# Patient Record
Sex: Male | Born: 1950 | Race: White | Hispanic: No | Marital: Married | Smoking: Former smoker
Health system: Southern US, Community
[De-identification: ages and names within clinical notes are randomized; demographics above are authoritative.]

## PROBLEM LIST (undated history)

## (undated) DIAGNOSIS — Z0000 Encounter for general adult medical examination without abnormal findings: Secondary | ICD-10-CM

## (undated) DIAGNOSIS — I712 Thoracic aortic aneurysm, without rupture, unspecified: Secondary | ICD-10-CM

## (undated) DIAGNOSIS — T7840XA Allergy, unspecified, initial encounter: Secondary | ICD-10-CM

## (undated) DIAGNOSIS — K5790 Diverticulosis of intestine, part unspecified, without perforation or abscess without bleeding: Secondary | ICD-10-CM

## (undated) DIAGNOSIS — K219 Gastro-esophageal reflux disease without esophagitis: Secondary | ICD-10-CM

## (undated) DIAGNOSIS — E119 Type 2 diabetes mellitus without complications: Secondary | ICD-10-CM

## (undated) DIAGNOSIS — J45909 Unspecified asthma, uncomplicated: Secondary | ICD-10-CM

## (undated) HISTORY — DX: Unspecified asthma, uncomplicated: J45.909

## (undated) HISTORY — DX: Type 2 diabetes mellitus without complications: E11.9

## (undated) HISTORY — DX: Diverticulosis of intestine, part unspecified, without perforation or abscess without bleeding: K57.90

## (undated) HISTORY — DX: Gastro-esophageal reflux disease without esophagitis: K21.9

## (undated) HISTORY — DX: Encounter for general adult medical examination without abnormal findings: Z00.00

## (undated) HISTORY — PX: CATARACT EXTRACTION: SUR2

## (undated) HISTORY — DX: Allergy, unspecified, initial encounter: T78.40XA

---

## 1997-10-14 ENCOUNTER — Emergency Department (HOSPITAL_COMMUNITY): Admission: EM | Admit: 1997-10-14 | Discharge: 1997-10-14 | Payer: Self-pay | Admitting: Emergency Medicine

## 2000-06-20 DIAGNOSIS — K219 Gastro-esophageal reflux disease without esophagitis: Secondary | ICD-10-CM

## 2000-06-20 HISTORY — DX: Gastro-esophageal reflux disease without esophagitis: K21.9

## 2001-01-18 ENCOUNTER — Encounter: Payer: Self-pay | Admitting: *Deleted

## 2001-01-18 ENCOUNTER — Encounter: Admission: RE | Admit: 2001-01-18 | Discharge: 2001-01-18 | Payer: Self-pay | Admitting: *Deleted

## 2001-04-06 LAB — HM COLONOSCOPY

## 2001-04-11 ENCOUNTER — Ambulatory Visit (HOSPITAL_COMMUNITY): Admission: RE | Admit: 2001-04-11 | Discharge: 2001-04-11 | Payer: Self-pay | Admitting: Gastroenterology

## 2001-04-11 ENCOUNTER — Encounter (INDEPENDENT_AMBULATORY_CARE_PROVIDER_SITE_OTHER): Payer: Self-pay | Admitting: *Deleted

## 2001-05-11 ENCOUNTER — Encounter (INDEPENDENT_AMBULATORY_CARE_PROVIDER_SITE_OTHER): Payer: Self-pay | Admitting: *Deleted

## 2001-05-11 ENCOUNTER — Ambulatory Visit (HOSPITAL_COMMUNITY): Admission: RE | Admit: 2001-05-11 | Discharge: 2001-05-11 | Payer: Self-pay | Admitting: Gastroenterology

## 2005-12-12 ENCOUNTER — Ambulatory Visit: Payer: Self-pay | Admitting: Family Medicine

## 2005-12-15 ENCOUNTER — Ambulatory Visit: Payer: Self-pay | Admitting: Family Medicine

## 2005-12-22 ENCOUNTER — Ambulatory Visit: Payer: Self-pay | Admitting: Family Medicine

## 2009-02-03 ENCOUNTER — Ambulatory Visit: Payer: Self-pay | Admitting: Family Medicine

## 2009-11-10 ENCOUNTER — Ambulatory Visit: Payer: Self-pay | Admitting: Family Medicine

## 2009-12-03 ENCOUNTER — Ambulatory Visit: Payer: Self-pay | Admitting: Family Medicine

## 2009-12-25 ENCOUNTER — Ambulatory Visit: Payer: Self-pay | Admitting: Family Medicine

## 2010-01-29 LAB — HM COLONOSCOPY

## 2010-06-28 ENCOUNTER — Ambulatory Visit
Admission: RE | Admit: 2010-06-28 | Discharge: 2010-06-28 | Payer: Self-pay | Source: Home / Self Care | Attending: Family Medicine | Admitting: Family Medicine

## 2010-11-05 NOTE — Procedures (Signed)
[place]. [facility]  Patient:    James Dunn, James Dunn Visit Number: [id number] MRN: [id number]          Service Type: END Location: ENDO Attending Physician:  Charna Elizabeth Dictated by:   Anselmo Rod, M.D. Proc. Date: 04/11/01 Admit Date:  04/11/2001   CC:         Heather Roberts, M.D.   Procedure Report  DATE OF BIRTH:  1951-05-01  REFERRING PHYSICIAN:  Heather Roberts, M.D.  PROCEDURE PERFORMED:  Colonoscopy with snare polypectomy x 1 and biopsies x 7.  ENDOSCOPIST:  Anselmo Rod, M.D.  INSTRUMENT USED:  Olympus adult video colonoscope.  INDICATIONS FOR PROCEDURE:  Blood in stool in a 60 year old white male.  Rule out colonic polyps, masses, hemorrhoids, etc.  PREPROCEDURE PREPARATION:  Informed consent was procured from the patient. The patient was fasted for eight hours prior to the procedure and prepped with a bottle of magnesium citrate and a gallon of NuLytely the night prior to the procedure.  PREPROCEDURE PHYSICAL:  The patient had stable vital signs.  Neck supple. Chest clear to auscultation.  S1, S2 regular.  Abdomen soft with normal abdominal bowel sounds.  DESCRIPTION OF PROCEDURE:  The patient was placed in the left lateral decubitus position and no sedation was used.  Once the patient was adequately positioned, the Olympus video colonoscope was advanced from the rectum to the cecum with difficulty secondary to a large amount of residual stool in the cecum.  There was a large polypoid lesion seen in the right colon.  This was snared piecemeal.  Only a portion of this polyp could be removed.  There were multiple cecal polyps seen.  Because of inadequate prep the cecal polyps were not removed.  An erythematous lesion was seen in the left colon that was biopsied from 20 cm.  There were two other polyps present close to this area but were not removed as the plans were to repeat the colonoscopy and remove the residual polyps in  the colon.  The patient had a few scattered diverticula and tolerated the procedure well without complications.  IMPRESSION: 1. Multiple colonic polyps, large polyps seen in the right colon just distal    to the cecum removed piecemeal by snare polypectomy.  Residual portion of    the polyp was still thought to be left close to the base. 2. Three small cecal polyps seen not removed because of inadequate prep. 3. Erythematous lesion biopsied at 20 cm. 4. Few scattered diverticula.  RECOMMENDATIONS: 1. Await pathology results.  Avoid all nonsteroidals for now. 2. Repeat colonoscopy once the pathology results have been procured. 3. The patient may need a surgical evaluation depending on the pathology    results on the large polyp snared from the proximal right colon.Dictated by: Anselmo Rod, M.D. Attending Physician:  Charna Elizabeth DD:  04/11/01 TD:  04/12/01 Job: [id number]

## 2010-11-05 NOTE — Procedures (Signed)
[place]. [facility]  Patient:    James Dunn, James Dunn Visit Number: [id number] MRN: [id number]          Service Type: END Location: ENDO Attending Physician:  Charna Elizabeth Dictated by:   Anselmo Rod, M.D. Proc. Date: 05/11/01 Admit Date:  05/11/2001 Discharge Date: 05/11/2001   CC:         Heather Roberts, M.D.   Procedure Report  DATE OF BIRTH:  November 22, 1950  REFERRING PHYSICIAN:  Heather Roberts, M.D.  PROCEDURE PERFORMED:  Colonoscopy with snare polypectomy x 6.  ENDOSCOPIST:  Anselmo Rod, M.D.  INSTRUMENT USED:  Olympus video colonoscope.  INDICATIONS FOR PROCEDURE:  Colonic polyp seen on a recent colonoscopy which was incomplete because of inappropriate prep.  Therefore repeat colonoscopy is being performed.  PREPROCEDURE PREPARATION:  Informed consent was procured from the patient. The patient was fasted for eight hours prior to the procedure and prepped with a bottle of magnesium citrate and a gallon of NuLytely the night prior to the procedure.  PREPROCEDURE PHYSICAL:  The patient had stable vital signs.  Neck supple. Chest clear to auscultation.  S1, S2 regular.  Abdomen soft with normal abdominal bowel sounds.  DESCRIPTION OF PROCEDURE:  The patient was placed in the left lateral decubitus position and sedated with 70 mg of Demerol and 7 mg of Versed intravenously.  Once the patient was adequately sedated and maintained on low-flow oxygen and continuous cardiac monitoring, the Olympus video colonoscope was advanced from the rectum to the cecum without difficulty.  The patient had five small polyps removed from the right colon, two of which were in the cecum.  Three of them were removed by hot biopsy from the right colon. One polyp was snared at 65 cm.  Another one was snared from 30 cm and these were placed in bottle #2.  No diverticulosis, large masses or hemorrhoids were seen.  IMPRESSION: 1. Multiple colonic polyps  removed as mentioned above. 2. No diverticulosis or large masses seen.  RECOMMENDATIONS: 1. Await pathology results. 2. Avoid all nonsteroidals including aspirin for the next two to three weeks. 3. Repeat colorectal cancer screening in the next three to five years    depending on the pathology results. 4. Outpatient follow up in the next two weeks.Dictated by:   Anselmo Rod, M.D. Attending Physician:  Charna Elizabeth DD:  05/14/01 TD:  05/15/01 Job: [id number]

## 2010-12-06 ENCOUNTER — Encounter: Payer: Self-pay | Admitting: Family Medicine

## 2010-12-27 ENCOUNTER — Encounter: Payer: Self-pay | Admitting: Family Medicine

## 2010-12-28 ENCOUNTER — Ambulatory Visit (INDEPENDENT_AMBULATORY_CARE_PROVIDER_SITE_OTHER): Payer: 59 | Admitting: Family Medicine

## 2010-12-28 ENCOUNTER — Encounter: Payer: Self-pay | Admitting: Family Medicine

## 2010-12-28 VITALS — BP 130/82 | HR 70 | Wt 193.0 lb

## 2010-12-28 DIAGNOSIS — F524 Premature ejaculation: Secondary | ICD-10-CM

## 2010-12-28 DIAGNOSIS — E739 Lactose intolerance, unspecified: Secondary | ICD-10-CM

## 2010-12-28 DIAGNOSIS — E8881 Metabolic syndrome: Secondary | ICD-10-CM

## 2010-12-28 DIAGNOSIS — B079 Viral wart, unspecified: Secondary | ICD-10-CM

## 2010-12-28 DIAGNOSIS — N529 Male erectile dysfunction, unspecified: Secondary | ICD-10-CM

## 2010-12-28 HISTORY — DX: Male erectile dysfunction, unspecified: N52.9

## 2010-12-28 LAB — POCT GLYCOSYLATED HEMOGLOBIN (HGB A1C): Hemoglobin A1C: 6.4

## 2010-12-28 MED ORDER — SERTRALINE HCL 25 MG PO TABS: 25.0000 mg | ORAL_TABLET | Freq: Every day | ORAL | Status: DC

## 2010-12-28 MED ORDER — VARDENAFIL HCL 20 MG PO TABS: 20.0000 mg | ORAL_TABLET | ORAL | Status: DC | PRN

## 2010-12-28 NOTE — Patient Instructions (Signed)
Call the therapist and set up an appointment for you and your wife or goal own. Let me know how the Zoloft and Levitra work. Return here if you have me to work on the wart.

## 2010-12-28 NOTE — Progress Notes (Signed)
  Subjective:    Patient ID: James Dunn, male    DOB: 25-Sep-1950, 60 y.o.   MRN: [id number]  HPI He is here for a recheck. He has a previous history of glucose intolerance. His weight and blood pressure are recorded. He continues to smoke. He does complain of diffuse intermittent cramping in the arms and the legs but cannot relate this to any particular activity or medications. He also has 2 lesions present on as needed he would like evaluated. He also has a lesion on his right flank. He does complain of difficulty getting and maintaining erections as well as premature ejaculation. He also continues to have difficulty with effective communication between him and his wife. Work continues to go well. He drinks very little.   Review of Systems Negative except as above    Objective:   Physical Exam alert and in no distress. Blood pressure and weight are recorded. 2 warts present on the right knee. He also has a round well demarcated pigmented lesion present on his right flank. Hemoglobin A1c 6.4        Assessment & Plan:  Glucose intolerance. Cigarette abuse. Marital stress. Erectile dysfunction. Premature ejaculation. Seborrheic dermatitis. Warts. Cramps. I will do routine blood screening. Again cautioned him on making diet and exercise changes to help with his risk for diabetes. I explained to him that it is only a matter of time before he tips over the edge. I will also give him Zoloft to help with premature ejaculation. A sample of Levitra given with proper instructions on use. No therapy for the seborrheic dermatitis. Recommend he use Compound W on the warts on a cyclic basis and return here if you would like me to freeze them. Over 45 minutes spent discussing all these issues with him. Did recommend counseling for him and his wife.

## 2010-12-29 LAB — CBC WITH DIFFERENTIAL/PLATELET
Basophils Absolute: 0 10*3/uL (ref 0.0–0.1)
Basophils Relative: 0 % (ref 0–1)
Eosinophils Absolute: 0.3 10*3/uL (ref 0.0–0.7)
Eosinophils Relative: 4 % (ref 0–5)
HCT: 47.6 % (ref 39.0–52.0)
Hemoglobin: 16.1 g/dL (ref 13.0–17.0)
Lymphocytes Relative: 28 % (ref 12–46)
Lymphs Abs: 2.3 10*3/uL (ref 0.7–4.0)
MCH: 31.4 pg (ref 26.0–34.0)
MCHC: 33.8 g/dL (ref 30.0–36.0)
MCV: 92.8 fL (ref 78.0–100.0)
Monocytes Absolute: 0.6 10*3/uL (ref 0.1–1.0)
Monocytes Relative: 7 % (ref 3–12)
Neutro Abs: 5.1 10*3/uL (ref 1.7–7.7)
Neutrophils Relative %: 61 % (ref 43–77)
Platelets: 250 10*3/uL (ref 150–400)
RBC: 5.13 MIL/uL (ref 4.22–5.81)
RDW: 14 % (ref 11.5–15.5)
WBC: 8.3 10*3/uL (ref 4.0–10.5)

## 2010-12-29 LAB — COMPREHENSIVE METABOLIC PANEL
ALT: 28 U/L (ref 0–53)
AST: 20 U/L (ref 0–37)
Albumin: 4.4 g/dL (ref 3.5–5.2)
Alkaline Phosphatase: 56 U/L (ref 39–117)
BUN: 10 mg/dL (ref 6–23)
CO2: 25 mEq/L (ref 19–32)
Calcium: 9.2 mg/dL (ref 8.4–10.5)
Chloride: 99 mEq/L (ref 96–112)
Creat: 1.09 mg/dL (ref 0.50–1.35)
Glucose, Bld: 106 mg/dL — ABNORMAL HIGH (ref 70–99)
Potassium: 4.4 mEq/L (ref 3.5–5.3)
Sodium: 136 mEq/L (ref 135–145)
Total Bilirubin: 0.7 mg/dL (ref 0.3–1.2)
Total Protein: 6.8 g/dL (ref 6.0–8.3)

## 2010-12-29 LAB — LIPID PANEL
Cholesterol: 137 mg/dL (ref 0–200)
HDL: 29 mg/dL — ABNORMAL LOW (ref >39)
LDL Cholesterol: 64 mg/dL (ref 0–99)
Total CHOL/HDL Ratio: 4.7 Ratio
Triglycerides: 221 mg/dL — ABNORMAL HIGH (ref <150)
VLDL: 44 mg/dL — ABNORMAL HIGH (ref 0–40)

## 2010-12-30 ENCOUNTER — Telehealth: Payer: Self-pay

## 2010-12-30 NOTE — Telephone Encounter (Signed)
Called pt talk with pt about labs and mailing diet info

## 2011-06-28 ENCOUNTER — Encounter: Payer: Self-pay | Admitting: Family Medicine

## 2011-06-28 ENCOUNTER — Ambulatory Visit (INDEPENDENT_AMBULATORY_CARE_PROVIDER_SITE_OTHER): Payer: 59 | Admitting: Family Medicine

## 2011-06-28 VITALS — BP 130/80 | HR 60 | Ht 67.5 in | Wt 192.0 lb

## 2011-06-28 DIAGNOSIS — F17200 Nicotine dependence, unspecified, uncomplicated: Secondary | ICD-10-CM

## 2011-06-28 DIAGNOSIS — Z23 Encounter for immunization: Secondary | ICD-10-CM

## 2011-06-28 DIAGNOSIS — E8881 Metabolic syndrome: Secondary | ICD-10-CM

## 2011-06-28 DIAGNOSIS — N529 Male erectile dysfunction, unspecified: Secondary | ICD-10-CM

## 2011-06-28 LAB — POCT GLYCOSYLATED HEMOGLOBIN (HGB A1C): Hemoglobin A1C: 6.2

## 2011-06-28 MED ORDER — METFORMIN HCL 500 MG PO TABS: 500.0000 mg | ORAL_TABLET | ORAL | Status: DC

## 2011-06-28 NOTE — Progress Notes (Signed)
  Subjective:    Patient ID: James Dunn, male    DOB: May 27, 1951, 61 y.o.   MRN: [id number]  HPI He is here for a recheck. He continues to smoke but has cut down significantly. He now uses in mainly for relaxing situations. He has not needed Levitra on any kind of a regular basis. He has made some dietary changes. Does have a previous history of metabolic syndrome. He has no other concerns or complaints.    Review of Systems     Objective:   Physical Exam  Alert and in no distress.  A1c is 6.2.      Assessment & Plan:   1. Metabolic syndrome  POCT HgB A1C  2. Need for prophylactic vaccination and inoculation against influenza  Flu vaccine greater than or equal to 3yo preservative free IM  3. ED (erectile dysfunction)    4. Current smoker     I again stressed the need for him to quit smoking entirely. Discussed his hemoglobin A1c and her risk for diabetes. He is in agreement to go on metformin to help with this. He will call if he needs a refill on his Levitra. Flu shot given.

## 2011-06-29 ENCOUNTER — Ambulatory Visit: Payer: 59 | Admitting: Family Medicine

## 2011-07-20 ENCOUNTER — Telehealth: Payer: Self-pay | Admitting: Family Medicine

## 2011-07-20 NOTE — Telephone Encounter (Signed)
TSD  

## 2011-11-15 ENCOUNTER — Ambulatory Visit: Payer: 59 | Admitting: Family Medicine

## 2011-11-21 ENCOUNTER — Encounter: Payer: Self-pay | Admitting: Family Medicine

## 2011-11-21 ENCOUNTER — Ambulatory Visit (INDEPENDENT_AMBULATORY_CARE_PROVIDER_SITE_OTHER): Payer: 59 | Admitting: Family Medicine

## 2011-11-21 VITALS — BP 130/83 | HR 65 | Wt 192.0 lb

## 2011-11-21 DIAGNOSIS — E8881 Metabolic syndrome: Secondary | ICD-10-CM

## 2011-11-21 DIAGNOSIS — N529 Male erectile dysfunction, unspecified: Secondary | ICD-10-CM

## 2011-11-21 DIAGNOSIS — Z2911 Encounter for prophylactic immunotherapy for respiratory syncytial virus (RSV): Secondary | ICD-10-CM

## 2011-11-21 DIAGNOSIS — Z79899 Other long term (current) drug therapy: Secondary | ICD-10-CM

## 2011-11-21 DIAGNOSIS — F17200 Nicotine dependence, unspecified, uncomplicated: Secondary | ICD-10-CM

## 2011-11-21 DIAGNOSIS — E119 Type 2 diabetes mellitus without complications: Secondary | ICD-10-CM

## 2011-11-21 LAB — POCT GLYCOSYLATED HEMOGLOBIN (HGB A1C): Hemoglobin A1C: 5.9

## 2011-11-21 NOTE — Progress Notes (Signed)
  Subjective:    Patient ID: James Dunn, male    DOB: 08/22/1950, 61 y.o.   MRN: [id number]  HPI He is here for a recheck. He does not check his blood sugars and has yet not received a glucometer. He is taking his metformin and does occasionally use his Levitra. He continues to smoke and at this point is not ready to quit. His physical activity is somewhat active although he is not involved in a regular exercise program. He has not had a recent eye exam and does not check his feet regularly. Review of record indicates his highest hemoglobin A1c was 6.4.  Review of Systems     Objective:   Physical Exam Alert and in no distress. He will A1c is 5.9.       Assessment & Plan:   1. Type II or unspecified type diabetes mellitus without mention of complication, not stated as uncontrolled  POCT HgB A1C  2. Metabolic syndrome  CBC with Differential, Comprehensive metabolic panel, Lipid panel  3. ED (erectile dysfunction)    4. Current smoker    5. Encounter for long-term (current) use of other medications  Varicella-zoster vaccine subcutaneous  I explained that since he technically did not have diabetes with an A1c of 6.4, this was essentially a moot point in that diabetes is a spectrum of disease. He did seem to understand that. We will teach Money is a glucometer. Discussed the need to keep his blood sugars under 150-180. Again encouraged him to stop stopping however at this point is not interested. Recheck here in about 6 months.

## 2011-11-21 NOTE — Patient Instructions (Signed)
When you're rated quit smoking, give me a call. Periodically check your blood sugar and if it goes above 150, call me

## 2011-11-22 LAB — LIPID PANEL
Cholesterol: 150 mg/dL (ref 0–200)
HDL: 32 mg/dL — ABNORMAL LOW (ref >39)
LDL Cholesterol: 74 mg/dL (ref 0–99)
Total CHOL/HDL Ratio: 4.7 Ratio
Triglycerides: 219 mg/dL — ABNORMAL HIGH (ref <150)
VLDL: 44 mg/dL — ABNORMAL HIGH (ref 0–40)

## 2011-11-22 LAB — COMPREHENSIVE METABOLIC PANEL
ALT: 39 U/L (ref 0–53)
AST: 26 U/L (ref 0–37)
Albumin: 4.3 g/dL (ref 3.5–5.2)
Alkaline Phosphatase: 55 U/L (ref 39–117)
BUN: 11 mg/dL (ref 6–23)
CO2: 22 mEq/L (ref 19–32)
Calcium: 9.1 mg/dL (ref 8.4–10.5)
Chloride: 103 mEq/L (ref 96–112)
Creat: 1.07 mg/dL (ref 0.50–1.35)
Glucose, Bld: 101 mg/dL — ABNORMAL HIGH (ref 70–99)
Potassium: 4.4 mEq/L (ref 3.5–5.3)
Sodium: 136 mEq/L (ref 135–145)
Total Bilirubin: 0.9 mg/dL (ref 0.3–1.2)
Total Protein: 7 g/dL (ref 6.0–8.3)

## 2011-11-22 LAB — CBC WITH DIFFERENTIAL/PLATELET
Basophils Absolute: 0 10*3/uL (ref 0.0–0.1)
Basophils Relative: 1 % (ref 0–1)
Eosinophils Absolute: 0.3 10*3/uL (ref 0.0–0.7)
Eosinophils Relative: 5 % (ref 0–5)
HCT: 45.3 % (ref 39.0–52.0)
Hemoglobin: 15.4 g/dL (ref 13.0–17.0)
Lymphocytes Relative: 35 % (ref 12–46)
Lymphs Abs: 2.4 10*3/uL (ref 0.7–4.0)
MCH: 31.3 pg (ref 26.0–34.0)
MCHC: 34 g/dL (ref 30.0–36.0)
MCV: 92.1 fL (ref 78.0–100.0)
Monocytes Absolute: 0.6 10*3/uL (ref 0.1–1.0)
Monocytes Relative: 8 % (ref 3–12)
Neutro Abs: 3.6 10*3/uL (ref 1.7–7.7)
Neutrophils Relative %: 51 % (ref 43–77)
Platelets: 250 10*3/uL (ref 150–400)
RBC: 4.92 MIL/uL (ref 4.22–5.81)
RDW: 13.7 % (ref 11.5–15.5)
WBC: 6.9 10*3/uL (ref 4.0–10.5)

## 2011-11-22 NOTE — Progress Notes (Signed)
Quick Note:  The blood work is normal ______ 

## 2011-12-24 ENCOUNTER — Other Ambulatory Visit: Payer: Self-pay | Admitting: Family Medicine

## 2012-05-22 ENCOUNTER — Ambulatory Visit: Payer: 59 | Admitting: Family Medicine

## 2012-06-25 ENCOUNTER — Encounter: Payer: Self-pay | Admitting: Family Medicine

## 2012-06-25 ENCOUNTER — Ambulatory Visit (INDEPENDENT_AMBULATORY_CARE_PROVIDER_SITE_OTHER): Payer: 59 | Admitting: Family Medicine

## 2012-06-25 VITALS — BP 128/70 | HR 85 | Temp 98.6°F | Wt 191.0 lb

## 2012-06-25 DIAGNOSIS — F17200 Nicotine dependence, unspecified, uncomplicated: Secondary | ICD-10-CM

## 2012-06-25 DIAGNOSIS — J069 Acute upper respiratory infection, unspecified: Secondary | ICD-10-CM

## 2012-06-25 DIAGNOSIS — Z87891 Personal history of nicotine dependence: Secondary | ICD-10-CM | POA: Insufficient documentation

## 2012-06-25 DIAGNOSIS — E119 Type 2 diabetes mellitus without complications: Secondary | ICD-10-CM

## 2012-06-25 HISTORY — DX: Personal history of nicotine dependence: Z87.891

## 2012-06-25 HISTORY — DX: Type 2 diabetes mellitus without complications: E11.9

## 2012-06-25 MED ORDER — ALBUTEROL SULFATE HFA 108 (90 BASE) MCG/ACT IN AERS: 2.0000 | INHALATION_SPRAY | Freq: Four times a day (QID) | RESPIRATORY_TRACT | Status: DC | PRN

## 2012-06-25 NOTE — Progress Notes (Signed)
  Subjective:    Patient ID: James Dunn, male    DOB: 1950-08-31, 62 y.o.   MRN: [id number]  HPI 4 days ago he had the onset of fever , chills, anorexia, dry cough that has now become productive as well as a headache but no sore throat or earache. He continues to smoke although he has not smoked will he's had these symptoms present. He has not followed up on his diabetes from last July.   Review of Systems     Objective:   Physical Exam alert and in no distress. Tympanic membranes and canals are normal. Throat is clear. Tonsils are normal. Neck is supple without adenopathy or thyromegaly. Cardiac exam shows a regular sinus rhythm without murmurs or gallops. Lungs show expiratory wheezing.        Assessment & Plan:   1. URI, acute  albuterol (PROVENTIL HFA;VENTOLIN HFA) 108 (90 BASE) MCG/ACT inhaler  2. Current smoker    3. Type II or unspecified type diabetes mellitus without mention of complication, not stated as uncontrolled     he is not ready to quit smoking. I will treat him with Proventil but did encourage him to call me if after 7-10 days he is not improved. Also he is to followup within the next several weeks on his underlying diabetes.

## 2012-07-10 ENCOUNTER — Encounter: Payer: Self-pay | Admitting: Family Medicine

## 2012-07-10 ENCOUNTER — Ambulatory Visit (INDEPENDENT_AMBULATORY_CARE_PROVIDER_SITE_OTHER): Payer: 59 | Admitting: Family Medicine

## 2012-07-10 VITALS — BP 128/88 | HR 79 | Wt 192.0 lb

## 2012-07-10 DIAGNOSIS — F17200 Nicotine dependence, unspecified, uncomplicated: Secondary | ICD-10-CM

## 2012-07-10 DIAGNOSIS — E119 Type 2 diabetes mellitus without complications: Secondary | ICD-10-CM

## 2012-07-10 DIAGNOSIS — Z23 Encounter for immunization: Secondary | ICD-10-CM

## 2012-07-10 NOTE — Progress Notes (Signed)
  Subjective:    Patient ID: James Dunn, male    DOB: [birth year], 62 y.o.   MRN: [id number]  HPI He is here for a recheck. Since his last visit he is doing much better having much less difficulty with coughing. He is still using his albuterol inhaler. He has stopped taking his Glucophage at the recommendation of his wife. He has not been on this medication in approximately 2 months. He states his blood sugars run in the 80 range. He states he is now down to 3 or 4 cigarettes per day. He keeps himself busy and does play golf. He does intermittently check his blood sugar.   Review of Systems     Objective:   Physical Exam Alert and in no distress. Globin A1c is 6.4.       Assessment & Plan:   1. Type II or unspecified type diabetes mellitus without mention of complication, not stated as uncontrolled  POCT glycosylated hemoglobin (Hb A1C)  2. Current smoker     25 minutes spent discussing his diabetes as well as smoking and medication use. Since he stopped his Glucophage and is doing well, I will recommend he stay off this medication but check his blood sugars more regularly. Encouraged him to quit smoking entirely. Discussed the fact that the last several cigarettes seem to be his friends and he is unwilling to give them up. Recheck here in several months.

## 2012-11-19 ENCOUNTER — Ambulatory Visit: Payer: 59 | Admitting: Family Medicine

## 2013-04-02 ENCOUNTER — Other Ambulatory Visit (INDEPENDENT_AMBULATORY_CARE_PROVIDER_SITE_OTHER): Payer: 59

## 2013-04-02 DIAGNOSIS — Z23 Encounter for immunization: Secondary | ICD-10-CM

## 2015-05-21 ENCOUNTER — Other Ambulatory Visit (INDEPENDENT_AMBULATORY_CARE_PROVIDER_SITE_OTHER): Payer: PPO

## 2015-05-21 DIAGNOSIS — Z23 Encounter for immunization: Secondary | ICD-10-CM

## 2016-03-30 ENCOUNTER — Other Ambulatory Visit (INDEPENDENT_AMBULATORY_CARE_PROVIDER_SITE_OTHER): Payer: PPO

## 2016-03-30 DIAGNOSIS — Z23 Encounter for immunization: Secondary | ICD-10-CM

## 2016-05-11 ENCOUNTER — Ambulatory Visit (INDEPENDENT_AMBULATORY_CARE_PROVIDER_SITE_OTHER): Payer: PPO | Admitting: Family Medicine

## 2016-05-11 ENCOUNTER — Encounter: Payer: Self-pay | Admitting: Family Medicine

## 2016-05-11 VITALS — BP 122/82 | HR 67 | Ht 68.0 in | Wt 186.0 lb

## 2016-05-11 DIAGNOSIS — E089 Diabetes mellitus due to underlying condition without complications: Secondary | ICD-10-CM

## 2016-05-11 DIAGNOSIS — M6740 Ganglion, unspecified site: Secondary | ICD-10-CM | POA: Diagnosis not present

## 2016-05-11 DIAGNOSIS — IMO0001 Reserved for inherently not codable concepts without codable children: Secondary | ICD-10-CM

## 2016-05-11 DIAGNOSIS — E785 Hyperlipidemia, unspecified: Secondary | ICD-10-CM

## 2016-05-11 DIAGNOSIS — Z0000 Encounter for general adult medical examination without abnormal findings: Secondary | ICD-10-CM | POA: Diagnosis not present

## 2016-05-11 DIAGNOSIS — Z860100 Personal history of colon polyps, unspecified: Secondary | ICD-10-CM

## 2016-05-11 DIAGNOSIS — E0865 Diabetes mellitus due to underlying condition with hyperglycemia: Secondary | ICD-10-CM

## 2016-05-11 DIAGNOSIS — F17200 Nicotine dependence, unspecified, uncomplicated: Secondary | ICD-10-CM

## 2016-05-11 DIAGNOSIS — Z1159 Encounter for screening for other viral diseases: Secondary | ICD-10-CM | POA: Diagnosis not present

## 2016-05-11 DIAGNOSIS — Z91199 Patient's noncompliance with other medical treatment and regimen due to unspecified reason: Secondary | ICD-10-CM

## 2016-05-11 DIAGNOSIS — Z23 Encounter for immunization: Secondary | ICD-10-CM

## 2016-05-11 DIAGNOSIS — E1169 Type 2 diabetes mellitus with other specified complication: Secondary | ICD-10-CM | POA: Insufficient documentation

## 2016-05-11 DIAGNOSIS — Z9119 Patient's noncompliance with other medical treatment and regimen: Secondary | ICD-10-CM | POA: Diagnosis not present

## 2016-05-11 HISTORY — DX: Personal history of colonic polyps: Z86.010

## 2016-05-11 HISTORY — DX: Personal history of colon polyps, unspecified: Z86.0100

## 2016-05-11 HISTORY — DX: Type 2 diabetes mellitus with other specified complication: E11.69

## 2016-05-11 LAB — CBC WITH DIFFERENTIAL/PLATELET
Basophils Absolute: 0 cells/uL (ref 0–200)
Basophils Relative: 0 %
Eosinophils Absolute: 312 cells/uL (ref 15–500)
Eosinophils Relative: 4 %
HCT: 44.9 % (ref 38.5–50.0)
Hemoglobin: 15.7 g/dL (ref 13.2–17.1)
Lymphocytes Relative: 32 %
Lymphs Abs: 2496 cells/uL (ref 850–3900)
MCH: 32.2 pg (ref 27.0–33.0)
MCHC: 35 g/dL (ref 32.0–36.0)
MCV: 92.2 fL (ref 80.0–100.0)
MPV: 9.8 fL (ref 7.5–12.5)
Monocytes Absolute: 468 cells/uL (ref 200–950)
Monocytes Relative: 6 %
Neutro Abs: 4524 cells/uL (ref 1500–7800)
Neutrophils Relative %: 58 %
Platelets: 231 10*3/uL (ref 140–400)
RBC: 4.87 MIL/uL (ref 4.20–5.80)
RDW: 13.2 % (ref 11.0–15.0)
WBC: 7.8 10*3/uL (ref 4.0–10.5)

## 2016-05-11 LAB — POCT UA - MICROALBUMIN
Albumin/Creatinine Ratio, Urine, POC: 8.1
Creatinine, POC: 220.9 mg/dL
Microalbumin Ur, POC: 17.9 mg/L

## 2016-05-11 LAB — POCT GLYCOSYLATED HEMOGLOBIN (HGB A1C): Hemoglobin A1C: 10

## 2016-05-11 MED ORDER — SITAGLIPTIN PHOS-METFORMIN HCL 50-500 MG PO TABS: 1.0000 | ORAL_TABLET | Freq: Two times a day (BID) | ORAL | 1 refills | Status: DC

## 2016-05-11 MED ORDER — LISINOPRIL 5 MG PO TABS: 5.0000 mg | ORAL_TABLET | Freq: Every day | ORAL | 3 refills | Status: DC

## 2016-05-11 MED ORDER — ATORVASTATIN CALCIUM 10 MG PO TABS: 10.0000 mg | ORAL_TABLET | Freq: Every day | ORAL | 3 refills | Status: DC

## 2016-05-11 NOTE — Patient Instructions (Signed)
Go to the American diabetes Association website or [facility] See Dr. Katy Fitch for your eye care

## 2016-05-11 NOTE — Addendum Note (Signed)
Addended by: Randel Books on: 05/11/2016 03:18 PM   Modules accepted: Orders

## 2016-05-11 NOTE — Progress Notes (Signed)
Subjective:   HPI  James Dunn is a 65 y.o. male who presents for a complete physical and a Medicare annual wellness. He has not been seen here in over 3 years. At that time he had a hemoglobin A1c of 6.4 and was given metformin however he did not start taking it. Presently his only complaint is a lesion on his left wrist but it does not cause any difficulties. He does continue to smoke. He did quit smoking at one point in the past but started up again due to stress. He is not ready to quit smoking. He has a previous history of erectile dysfunction however this did not come for discussion. He basically states that he does not like taking medications. He has no other concerns or complaints. No evidence of recent falls or depression. His marriage is going quite well.  Medical care team includes:     Preventative care: Last ophthalmology visit: ? Last dental visit: 12/17 Last colonoscopy:01-29-10  Last EKG: ? Last labs:11-21-11  Prior vaccinations: TD or Tdap:12-03-09 Influenza:03-30-16 Pneumococcal:23 01-17-01 Shingles/Zostavax: 11/21/11  Advanced directive: Not discussed    Reviewed their medical, surgical, family, social, medication, and allergy history and updated chart as appropriate.    Review of Systems Negative except as above   Objective:   Physical Exam  General appearance: alert, no distress, WD/WN,  Skin:Normal HEENT: normocephalic, conjunctiva/corneas normal, sclerae anicteric, PERRLA, EOMi, nares patent, no discharge or erythema, pharynx normal Oral cavity: MMM, tongue normal, teeth normal Neck: supple, no lymphadenopathy, no thyromegaly, no masses, normal ROM Chest: non tender, normal shape and expansion Heart: RRR, normal S1, S2, no murmurs Lungs: CTA bilaterally, no wheezes, rhonchi, or rales Abdomen: +bs, soft, non tender, non distended, no masses, no hepatomegaly, no splenomegaly, no bruits musculoskeletal: upper extremities non tender, no obvious deformity,  normal ROM throughout, lower extremities non tender, 2 cm round smooth movable lesion noted on the left wrist Extremities: no edema, no cyanosis, no clubbing Pulses: 2+ symmetric, upper and lower extremities, normal cap refill Neurological: alert, oriented x 3, CN2-12 intact, strength normal upper extremities and lower extremities, sensation normal throughout, DTRs 2+ throughout, no cerebellar signs, gait normal Psychiatric: normal affect, behavior normal, pleasant  GU: normal male external genitalia, nontender, no masses, no hernia, no lymphadenopathy Hemoglobin A1c 10.0  Assessment and Plan :   Need for prophylactic vaccination against Streptococcus pneumoniae (pneumococcus) - Plan: Pneumococcal conjugate vaccine 13-valent  Current smoker  Diabetes mellitus due to underlying condition, uncontrolled, without complication, without long-term current use of insulin (HCC) - Plan: HgB A1c, CBC with Differential/Platelet, Comprehensive metabolic panel, Lipid panel, sitaGLIPtin-metformin (JANUMET) 50-500 MG tablet, lisinopril (PRINIVIL,ZESTRIL) 5 MG tablet, CANCELED: POCT UA - Microalbumin  Hyperlipidemia associated with type 2 diabetes mellitus (HCC) - Plan: Lipid panel, atorvastatin (LIPITOR) 10 MG tablet  History of colonic polyps - Plan: Ambulatory referral to Gastroenterology  Personal history of noncompliance with medical treatment, presenting hazards to health  Routine general medical examination at a health care facility - Plan: CBC with Differential/Platelet, Comprehensive metabolic panel, Lipid panel  Encounter for hepatitis C screening test for low risk patient - Plan: Hepatitis C antibody I discussed his lack of compliance with his overall care in detail with him. Discussed diabetes and the risk of stroke, blindness, heart failure, heart attack, kidney failure, nerve damage. Discussed foot care, eye care, checking his blood sugars regularly including before meals or 2 hours after meals.  Discussed going to diabetes and nutrition however he would rather try  and educate himself on the Internet. Discussed medications with him. Explained that with time he will need be placed on more and more medications. He will also be referred back to GI as he does have a previous history of adenomatous polyps. Instructed on proper use of the glucometer. Over one hour spent greater than 50% in counseling and coordination of care. I explained that the ganglion was of no major concern and nothing needed to be done.  Follow-up one month.

## 2016-05-12 LAB — LIPID PANEL
Cholesterol: 168 mg/dL (ref <200)
HDL: 27 mg/dL — ABNORMAL LOW (ref >40)
LDL Cholesterol: 89 mg/dL (ref <100)
Total CHOL/HDL Ratio: 6.2 Ratio — ABNORMAL HIGH (ref <5.0)
Triglycerides: 258 mg/dL — ABNORMAL HIGH (ref <150)
VLDL: 52 mg/dL — ABNORMAL HIGH (ref <30)

## 2016-05-12 LAB — COMPREHENSIVE METABOLIC PANEL
ALT: 27 U/L (ref 9–46)
AST: 22 U/L (ref 10–35)
Albumin: 4.6 g/dL (ref 3.6–5.1)
Alkaline Phosphatase: 54 U/L (ref 40–115)
BUN: 13 mg/dL (ref 7–25)
CO2: 24 mmol/L (ref 20–31)
Calcium: 9.3 mg/dL (ref 8.6–10.3)
Chloride: 100 mmol/L (ref 98–110)
Creat: 1.26 mg/dL — ABNORMAL HIGH (ref 0.70–1.25)
Glucose, Bld: 179 mg/dL — ABNORMAL HIGH (ref 65–99)
Potassium: 4.5 mmol/L (ref 3.5–5.3)
Sodium: 135 mmol/L (ref 135–146)
Total Bilirubin: 0.9 mg/dL (ref 0.2–1.2)
Total Protein: 7.4 g/dL (ref 6.1–8.1)

## 2016-05-12 LAB — HEPATITIS C ANTIBODY: HCV Ab: NEGATIVE

## 2016-05-20 ENCOUNTER — Telehealth: Payer: Self-pay | Admitting: Family Medicine

## 2016-05-20 MED ORDER — GLUCOSE BLOOD VI STRP: ORAL_STRIP | 12 refills | Status: DC

## 2016-05-20 MED ORDER — ONETOUCH ULTRASOFT LANCETS MISC: 12 refills | Status: DC

## 2016-05-20 NOTE — Telephone Encounter (Signed)
Pt called and stated that at his last office visit he was given a one touch ultra 2 blood monitor machine. He needs the strips and lancets to go with it. Please send to [facility].

## 2016-05-20 NOTE — Telephone Encounter (Signed)
Called in, pt notified. James Dunn

## 2016-06-22 ENCOUNTER — Encounter: Payer: Self-pay | Admitting: Family Medicine

## 2016-06-22 ENCOUNTER — Telehealth: Payer: Self-pay

## 2016-06-22 ENCOUNTER — Ambulatory Visit (INDEPENDENT_AMBULATORY_CARE_PROVIDER_SITE_OTHER): Payer: PPO | Admitting: Family Medicine

## 2016-06-22 VITALS — BP 132/80 | HR 68 | Resp 16 | Ht 67.0 in | Wt 182.8 lb

## 2016-06-22 DIAGNOSIS — J069 Acute upper respiratory infection, unspecified: Secondary | ICD-10-CM | POA: Diagnosis not present

## 2016-06-22 DIAGNOSIS — E0865 Diabetes mellitus due to underlying condition with hyperglycemia: Secondary | ICD-10-CM | POA: Diagnosis not present

## 2016-06-22 DIAGNOSIS — E089 Diabetes mellitus due to underlying condition without complications: Secondary | ICD-10-CM

## 2016-06-22 DIAGNOSIS — IMO0001 Reserved for inherently not codable concepts without codable children: Secondary | ICD-10-CM

## 2016-06-22 MED ORDER — ALBUTEROL SULFATE HFA 108 (90 BASE) MCG/ACT IN AERS: 2.0000 | INHALATION_SPRAY | Freq: Four times a day (QID) | RESPIRATORY_TRACT | 0 refills | Status: DC | PRN

## 2016-06-22 NOTE — Telephone Encounter (Signed)
Let him know that he will hear from the diabetes and nutrition program from  [facility]

## 2016-06-22 NOTE — Progress Notes (Signed)
  Subjective:    Patient ID: James Dunn, male    DOB: 04-08-1951, 66 y.o.   MRN: [id number]  James Dunn is a 66 y.o. male who presents for follow-up of Type 2 diabetes mellitus.  Patient is checking home blood sugars.   Home blood sugar records: BGs range between 81 and 149 7 day avg is 122 per machine How often is blood sugars being checked: checks 2-3 times for day.  Current symptoms/problems include none and have been stable. Daily foot checks: no    Any foot concerns:no Last eye exam: over 2-3 years.  Exercise: tries to but with weather has been limited  He is made some changes in his diet as well as exercise regimen and has lost roughly 3 pounds. He feels very good about this. He is checking his blood sugars before meals and 2 hours after meals. He has not really followed up concerning getting better educated. The following portions of the patient's history were reviewed and updated as appropriate: allergies, current medications, past medical history, past social history and problem list.  ROS as in subjective above.     Objective:    Physical Exam Alert and in no distress otherwise not examined.   Lab Review Diabetic Labs Latest Ref Rng & Units 05/11/2016 11/21/2011 06/28/2011 12/28/2010  HbA1c - 10.0 5.9 6.2 6.4  Microalbumin mg/L 17.9 - - -  Micro/Creat Ratio - 8.1 - - -  Chol <200 mg/dL 168 150 - 137  HDL >40 mg/dL 27(L) 32(L) - 29(L)  Calc LDL <100 mg/dL 89 74 - 64  Triglycerides <150 mg/dL 258(H) 219(H) - 221(H)  Creatinine 0.70 - 1.25 mg/dL 1.26(H) 1.07 - 1.09   BP/Weight 06/22/2016 05/11/2016 07/10/2012 [id number]  Systolic BP [id number]  Diastolic BP 80 82 88 70 83  Wt. (Lbs) 182.8 186 192 191 192  BMI 28.63 28.28 29.61 29.46 29.61     James Dunn  reports that he has been smoking Cigarettes.  He has never used smokeless tobacco. He reports that he drinks alcohol. He reports that he does not use drugs.     Assessment & Plan:     1. Rx changes:  none 2. Education: Reviewed 'ABCs' of diabetes management (respective goals in parentheses):  A1C (<7), blood pressure (<130/80), and cholesterol (LDL <100). 3. Compliance at present is estimated to be good. Efforts to improve compliance (if necessary) will be directed at I will set him up for diabetes and nutrition counseling.. Did discuss cutting back on carbohydrates. 4. Follow up: 3 months Will also renew albuterol as he does use this very sparingly when he has cough and congestion.

## 2016-06-22 NOTE — Telephone Encounter (Signed)
Number provided to pt as he does not answer numbers he is not familiar with. James Dunn

## 2016-06-22 NOTE — Telephone Encounter (Signed)
Pt questions what diabetes classes you were wanting him to attend?

## 2016-07-09 DIAGNOSIS — J40 Bronchitis, not specified as acute or chronic: Secondary | ICD-10-CM | POA: Diagnosis not present

## 2016-07-09 DIAGNOSIS — J209 Acute bronchitis, unspecified: Secondary | ICD-10-CM | POA: Diagnosis not present

## 2016-07-27 ENCOUNTER — Encounter: Payer: Self-pay | Admitting: Family Medicine

## 2016-07-27 ENCOUNTER — Ambulatory Visit (INDEPENDENT_AMBULATORY_CARE_PROVIDER_SITE_OTHER): Payer: PPO | Admitting: Family Medicine

## 2016-07-27 VITALS — BP 100/60 | HR 77 | Temp 98.0°F | Wt 173.0 lb

## 2016-07-27 DIAGNOSIS — F17200 Nicotine dependence, unspecified, uncomplicated: Secondary | ICD-10-CM

## 2016-07-27 DIAGNOSIS — R6883 Chills (without fever): Secondary | ICD-10-CM | POA: Diagnosis not present

## 2016-07-27 DIAGNOSIS — E119 Type 2 diabetes mellitus without complications: Secondary | ICD-10-CM | POA: Diagnosis not present

## 2016-07-27 LAB — POCT GLYCOSYLATED HEMOGLOBIN (HGB A1C): Hemoglobin A1C: 6.8

## 2016-07-27 MED ORDER — METFORMIN HCL 1000 MG PO TABS: 1000.0000 mg | ORAL_TABLET | Freq: Two times a day (BID) | ORAL | 1 refills | Status: DC

## 2016-07-27 NOTE — Progress Notes (Signed)
   Subjective:    Patient ID: James Dunn, male    DOB: 08/30/1950, 66 y.o.   MRN: [id number]  HPI Approximately one month ago he had URI symptoms and was seen in an urgent care center. He was placed on antibiotics as well as a tapered dose of steroids. He states he did get back to normal except for occasionally having difficulty with chills. No fever, sore throat, earache, nasal congestion, PND, chest congestion or coughing. He does smoke and is not yet ready to quit. Also he has concerns over his diabetes. He is taking Janumet but states that the cost is more than he wants to pay. He has been compulsive with his diet and exercise and is planning to go to the diabetes and nutrition classes. He has lost several pounds.   Review of Systems     Objective:   Physical Exam Alert and in no distress. Tympanic membranes and canals are normal. Pharyngeal area is normal. Neck is supple without adenopathy or thyromegaly. Cardiac exam shows a regular sinus rhythm without murmurs or gallops. Lungs are clear to auscultation.       Assessment & Plan:  Chills  Controlled type 2 diabetes mellitus without complication, without long-term current use of insulin (HCC)  Current smoker I explained that since his symptoms are only chills and his exam was normal, the etiology behind it is unclear. Recommended he keep track of the chills and any other symptoms that might still be occurring and let me know. We then discussed his diabetes. I will stop the Janumet and switch him back to metformin. He will continue to do a good job with his diet and exercise and we will recheck this in approximately 4 months.

## 2016-07-27 NOTE — Patient Instructions (Signed)
Keep track of the chills and see if there are any other symptoms that go with it

## 2016-08-02 ENCOUNTER — Encounter: Payer: Self-pay | Admitting: Dietician

## 2016-08-02 ENCOUNTER — Encounter: Payer: PPO | Attending: Family Medicine | Admitting: Dietician

## 2016-08-02 DIAGNOSIS — E089 Diabetes mellitus due to underlying condition without complications: Secondary | ICD-10-CM | POA: Insufficient documentation

## 2016-08-02 DIAGNOSIS — E0865 Diabetes mellitus due to underlying condition with hyperglycemia: Secondary | ICD-10-CM | POA: Insufficient documentation

## 2016-08-02 DIAGNOSIS — IMO0001 Reserved for inherently not codable concepts without codable children: Secondary | ICD-10-CM

## 2016-08-02 DIAGNOSIS — Z713 Dietary counseling and surveillance: Secondary | ICD-10-CM | POA: Insufficient documentation

## 2016-08-02 NOTE — Progress Notes (Signed)
Patient was seen on 08/02/16 for the first of a series of three diabetes self-management courses at the [facility].  Patient Education Plan per assessed needs and concerns is to attend four course education program for Diabetes Self Management Education.  The following learning objectives were met by the patient during this class:  Describe diabetes  State some common risk factors for diabetes  Defines the role of glucose and insulin  Identifies type of diabetes and pathophysiology  Describe the relationship between diabetes and cardiovascular risk  State the members of the Healthcare Team  States the rationale for glucose monitoring  State when to test glucose  State their individual Target Range  State the importance of logging glucose readings  Describe how to interpret glucose readings  Identifies A1C target  Explain the correlation between A1c and eAG values  State symptoms and treatment of high blood glucose  State symptoms and treatment of low blood glucose  Explain proper technique for glucose testing  Identifies proper sharps disposal  Handouts given during class include:  Living Well with Diabetes book  Carb Counting and Meal Planning book  Meal Plan Card  Carbohydrate guide  Meal planning worksheet  Low Sodium Flavoring Tips  The diabetes portion plate  Y4M to eAG Conversion Chart  Diabetes Medications  Diabetes Recommended Care Schedule  Support Group  Diabetes Success Plan  Core Class Satisfaction Survey  Follow-Up Plan:  Attend core 2

## 2016-08-09 ENCOUNTER — Encounter: Payer: PPO | Admitting: Dietician

## 2016-08-09 DIAGNOSIS — IMO0001 Reserved for inherently not codable concepts without codable children: Secondary | ICD-10-CM

## 2016-08-09 DIAGNOSIS — E0865 Diabetes mellitus due to underlying condition with hyperglycemia: Principal | ICD-10-CM

## 2016-08-09 DIAGNOSIS — Z713 Dietary counseling and surveillance: Secondary | ICD-10-CM | POA: Diagnosis not present

## 2016-08-09 NOTE — Progress Notes (Signed)

## 2016-08-16 ENCOUNTER — Encounter: Payer: PPO | Admitting: Dietician

## 2016-08-16 DIAGNOSIS — E0865 Diabetes mellitus due to underlying condition with hyperglycemia: Principal | ICD-10-CM

## 2016-08-16 DIAGNOSIS — Z713 Dietary counseling and surveillance: Secondary | ICD-10-CM | POA: Diagnosis not present

## 2016-08-16 DIAGNOSIS — IMO0001 Reserved for inherently not codable concepts without codable children: Secondary | ICD-10-CM

## 2016-08-16 NOTE — Progress Notes (Signed)
Patient was seen on 08/16/16 for the third of a series of three diabetes self-management courses at the [facility].   James Dunn the amount of activity recommended for healthy living . Describe activities suitable for individual needs . Identify ways to regularly incorporate activity into daily life . Identify barriers to activity and ways to over come these barriers  Identify diabetes medications being personally used and their primary action for lowering glucose and possible side effects . Describe role of stress on blood glucose and develop strategies to address psychosocial issues . Identify diabetes complications and ways to prevent them  Explain how to manage diabetes during illness . Evaluate success in meeting personal goal . Establish 2-3 goals that they will plan to diligently work on until they return for the  40-month follow-up visit  Goals:   I will count my carb choices at most meals and snacks  I will be active 30 minutes or more 5 times a week  I will eat less unhealthy fats.  Your patient has identified these potential barriers to change:  Motivation  Your patient has identified their diabetes self-care support plan as  Family Support On-line Resources Plan:  Attend Support Group as desired

## 2016-08-18 ENCOUNTER — Telehealth: Payer: Self-pay | Admitting: Family Medicine

## 2016-08-18 NOTE — Telephone Encounter (Signed)
Pt called and states that he got a robo call stating he needed to come in for a A1 c and he states that he just had one done and it does look like he had one done a couple a weeks a ago. Please advise

## 2016-08-19 NOTE — Telephone Encounter (Signed)
Left message for pt that call was not from [place] and that it may be [facility] but we are not for sure

## 2016-08-19 NOTE — Telephone Encounter (Signed)
I have no clue about the  robi call came from but not from [place]

## 2016-10-11 ENCOUNTER — Ambulatory Visit: Payer: PPO | Admitting: Family Medicine

## 2016-11-01 DIAGNOSIS — K5730 Diverticulosis of large intestine without perforation or abscess without bleeding: Secondary | ICD-10-CM | POA: Diagnosis not present

## 2016-11-01 DIAGNOSIS — Z1211 Encounter for screening for malignant neoplasm of colon: Secondary | ICD-10-CM | POA: Diagnosis not present

## 2016-11-01 DIAGNOSIS — Z86010 Personal history of colonic polyps: Secondary | ICD-10-CM | POA: Diagnosis not present

## 2016-11-01 DIAGNOSIS — K219 Gastro-esophageal reflux disease without esophagitis: Secondary | ICD-10-CM | POA: Diagnosis not present

## 2016-11-11 ENCOUNTER — Telehealth: Payer: Self-pay

## 2016-11-11 ENCOUNTER — Other Ambulatory Visit: Payer: Self-pay

## 2016-11-11 DIAGNOSIS — E119 Type 2 diabetes mellitus without complications: Secondary | ICD-10-CM

## 2016-11-11 MED ORDER — ONETOUCH DELICA LANCING DEV MISC: 1.0000 | Freq: Two times a day (BID) | 1 refills | Status: DC

## 2016-11-11 NOTE — Telephone Encounter (Signed)
Pt needs script of the one touch delica lancets, (not ultrasoft) [facility] to [facility] [address]. Thank you, RLB

## 2016-11-11 NOTE — Telephone Encounter (Signed)
Faxed to pharmacy

## 2016-11-29 ENCOUNTER — Encounter: Payer: Self-pay | Admitting: Family Medicine

## 2016-11-29 ENCOUNTER — Ambulatory Visit (INDEPENDENT_AMBULATORY_CARE_PROVIDER_SITE_OTHER): Payer: PPO | Admitting: Family Medicine

## 2016-11-29 VITALS — BP 120/70 | HR 46 | Ht 67.0 in | Wt 175.0 lb

## 2016-11-29 DIAGNOSIS — Z86010 Personal history of colonic polyps: Secondary | ICD-10-CM | POA: Diagnosis not present

## 2016-11-29 DIAGNOSIS — J301 Allergic rhinitis due to pollen: Secondary | ICD-10-CM | POA: Diagnosis not present

## 2016-11-29 DIAGNOSIS — E785 Hyperlipidemia, unspecified: Secondary | ICD-10-CM | POA: Diagnosis not present

## 2016-11-29 DIAGNOSIS — J4520 Mild intermittent asthma, uncomplicated: Secondary | ICD-10-CM

## 2016-11-29 DIAGNOSIS — E0865 Diabetes mellitus due to underlying condition with hyperglycemia: Secondary | ICD-10-CM

## 2016-11-29 DIAGNOSIS — N529 Male erectile dysfunction, unspecified: Secondary | ICD-10-CM | POA: Diagnosis not present

## 2016-11-29 DIAGNOSIS — E1169 Type 2 diabetes mellitus with other specified complication: Secondary | ICD-10-CM

## 2016-11-29 DIAGNOSIS — F17200 Nicotine dependence, unspecified, uncomplicated: Secondary | ICD-10-CM | POA: Diagnosis not present

## 2016-11-29 DIAGNOSIS — IMO0001 Reserved for inherently not codable concepts without codable children: Secondary | ICD-10-CM

## 2016-11-29 HISTORY — DX: Allergic rhinitis due to pollen: J30.1

## 2016-11-29 HISTORY — DX: Mild intermittent asthma, uncomplicated: J45.20

## 2016-11-29 LAB — POCT GLYCOSYLATED HEMOGLOBIN (HGB A1C): Hemoglobin A1C: 6.2

## 2016-11-29 NOTE — Progress Notes (Signed)
Subjective:    Patient ID: James Dunn, male    DOB: [birth year], 66 y.o.   MRN: [id number]  [name] is a 66 y.o. male who presents for follow-up of Type 2 diabetes mellitus.  Patient is checking home blood sugars.   Home blood sugar records: ave 120 How often is blood sugars being checked: once a day Current symptoms/problems None Daily foot checks: yes  Any foot concerns: none Last eye exam: Not yet Exercise: working and golfing He continues to smoke and is at least considering quitting. He does have underlying allergic rhinitis but does not take medications for this he rarely uses albuterol usually during spring and fall and mainly when he has coughing spells. He is taking metformin and having no difficulty with that. Continues on Lipitor and has had no aches or pains with that. He is also on lisinopril/HCTZ and having no swelling or discomfort. He is scheduled for a colonoscopy to follow-up on history of polyps. He does have underlying ED but presently is not on any medication. He does occasionally complain of right hip pain but at this point is not interested in pursuing any further evaluation. The following portions of the patient's history were reviewed and updated as appropriate: allergies, current medications, past medical history, past social history and problem list.  ROS as in subjective above.     Objective:    Physical Exam Alert and in no distress otherwise not examined.   Lab Review Diabetic Labs Latest Ref Rng & Units 07/27/2016 05/11/2016 11/21/2011 06/28/2011 12/28/2010  HbA1c - 6.8 10.0 5.9 6.2 6.4  Microalbumin mg/L - 17.9 - - -  Micro/Creat Ratio - - 8.1 - - -  Chol <200 mg/dL - 168 150 - 137  HDL >40 mg/dL - 27(L) 32(L) - 29(L)  Calc LDL <100 mg/dL - 89 74 - 64  Triglycerides <150 mg/dL - 258(H) 219(H) - 221(H)  Creatinine 0.70 - 1.25 mg/dL - 1.26(H) 1.07 - 1.09   BP/Weight 08/02/2016 07/27/2016 06/22/2016 05/11/2016 4/62/7035  Systolic BP - 009 381 829 937    Diastolic BP - 60 80 82 88  Wt. (Lbs) 174 173 182.8 186 192  BMI 27.25 27.1 28.63 28.28 29.61   A1c is 6.2 [name]  reports that he has been smoking Cigarettes.  He has never used smokeless tobacco. He reports that he drinks alcohol. He reports that he does not use drugs.     Assessment & Plan:    Diabetes mellitus due to underlying condition, uncontrolled, without complication, without long-term current use of insulin ([place]) - Plan: HgB A1c, Ambulatory referral to Ophthalmology  Current smoker  Erectile dysfunction, unspecified erectile dysfunction type  Hyperlipidemia associated with type 2 diabetes mellitus ([place])  History of colonic polyps  Seasonal allergic rhinitis due to pollen  Mild intermittent asthma without complication    1. Rx changes: none 2. Education: Reviewed 'ABCs' of diabetes management (respective goals in parentheses):  A1C (<7), blood pressure (<130/80), and cholesterol (LDL <100). 3. Compliance at present is estimated to be good. Efforts to improve compliance (if necessary) will be directed at No change. 4. Follow up: 4 months Discussed smoking cessation with him however at this point he is not interested. He is not interested in pursuing the right hip discomfort. He will continue on his present medication regimen for the above diagnoses. He is stable on all these medications.. I will set him up for ophthalmology evaluation. Discussed the use of albuterol. He seems to have a  good handle on this. Allergies are really not causing him much trouble. Presently he is not very sexually active so that does not seem to be a major problem.

## 2016-11-30 DIAGNOSIS — D125 Benign neoplasm of sigmoid colon: Secondary | ICD-10-CM | POA: Diagnosis not present

## 2016-11-30 DIAGNOSIS — D123 Benign neoplasm of transverse colon: Secondary | ICD-10-CM | POA: Diagnosis not present

## 2016-11-30 DIAGNOSIS — K635 Polyp of colon: Secondary | ICD-10-CM | POA: Diagnosis not present

## 2016-11-30 DIAGNOSIS — Z86010 Personal history of colonic polyps: Secondary | ICD-10-CM | POA: Diagnosis not present

## 2016-11-30 DIAGNOSIS — Z1211 Encounter for screening for malignant neoplasm of colon: Secondary | ICD-10-CM | POA: Diagnosis not present

## 2016-11-30 LAB — HM COLONOSCOPY

## 2016-12-02 ENCOUNTER — Encounter: Payer: Self-pay | Admitting: Family Medicine

## 2016-12-05 DIAGNOSIS — D126 Benign neoplasm of colon, unspecified: Secondary | ICD-10-CM | POA: Insufficient documentation

## 2016-12-05 HISTORY — DX: Benign neoplasm of colon, unspecified: D12.6

## 2016-12-12 ENCOUNTER — Encounter: Payer: Self-pay | Admitting: Family Medicine

## 2016-12-12 DIAGNOSIS — D126 Benign neoplasm of colon, unspecified: Secondary | ICD-10-CM

## 2017-01-16 DIAGNOSIS — E119 Type 2 diabetes mellitus without complications: Secondary | ICD-10-CM | POA: Diagnosis not present

## 2017-01-16 DIAGNOSIS — H2513 Age-related nuclear cataract, bilateral: Secondary | ICD-10-CM | POA: Diagnosis not present

## 2017-01-16 LAB — HM DIABETES EYE EXAM

## 2017-01-20 ENCOUNTER — Other Ambulatory Visit: Payer: Self-pay | Admitting: Family Medicine

## 2017-01-20 DIAGNOSIS — E119 Type 2 diabetes mellitus without complications: Secondary | ICD-10-CM

## 2017-01-24 ENCOUNTER — Encounter: Payer: Self-pay | Admitting: Family Medicine

## 2017-04-04 ENCOUNTER — Encounter: Payer: Self-pay | Admitting: Family Medicine

## 2017-04-04 ENCOUNTER — Ambulatory Visit (INDEPENDENT_AMBULATORY_CARE_PROVIDER_SITE_OTHER): Payer: PPO | Admitting: Family Medicine

## 2017-04-04 VITALS — BP 136/80 | HR 59 | Wt 182.8 lb

## 2017-04-04 DIAGNOSIS — IMO0001 Reserved for inherently not codable concepts without codable children: Secondary | ICD-10-CM

## 2017-04-04 DIAGNOSIS — E785 Hyperlipidemia, unspecified: Secondary | ICD-10-CM

## 2017-04-04 DIAGNOSIS — E0865 Diabetes mellitus due to underlying condition with hyperglycemia: Secondary | ICD-10-CM | POA: Diagnosis not present

## 2017-04-04 DIAGNOSIS — Z23 Encounter for immunization: Secondary | ICD-10-CM | POA: Diagnosis not present

## 2017-04-04 DIAGNOSIS — E1169 Type 2 diabetes mellitus with other specified complication: Secondary | ICD-10-CM

## 2017-04-04 DIAGNOSIS — F17200 Nicotine dependence, unspecified, uncomplicated: Secondary | ICD-10-CM

## 2017-04-04 LAB — POCT GLYCOSYLATED HEMOGLOBIN (HGB A1C): Hemoglobin A1C: 6.1

## 2017-04-04 NOTE — Progress Notes (Signed)
  Subjective:    Patient ID: James Dunn, male    DOB: 1951/01/28, 66 y.o.   MRN: [id number]  James Dunn is a 66 y.o. male who presents for follow-up of Type 2 diabetes mellitus.  Patient is checking home blood sugars.   Home blood sugar records: BGs range between 90 and 170 How often is blood sugars being checked: once a day  Current symptoms/problems include none and have been stable. Daily foot checks: yes   Any foot concerns: no Last eye exam: June 2018 Dr. Katy Fitch Exercise: play golf He continues to smoke and is not interested in quitting. He is taking atorvastatin and having no aches or pains. Continues on lisinopril without swelling or coughing. Also takes metformin regularly without difficulty. The following portions of the patient's history were reviewed and updated as appropriate: allergies, current medications, past medical history, past social history and problem list.  ROS as in subjective above.     Objective:    Physical Exam Alert and in no distress otherwise not examined.  Lab Review Diabetic Labs Latest Ref Rng & Units 11/29/2016 07/27/2016 05/11/2016 11/21/2011 06/28/2011  HbA1c - 6.2 6.8 10.0 5.9 6.2  Microalbumin mg/L - - 17.9 - -  Micro/Creat Ratio - - - 8.1 - -  Chol <200 mg/dL - - 168 150 -  HDL >40 mg/dL - - 27(L) 32(L) -  Calc LDL <100 mg/dL - - 89 74 -  Triglycerides <150 mg/dL - - 258(H) 219(H) -  Creatinine 0.70 - 1.25 mg/dL - - 1.26(H) 1.07 -   BP/Weight 11/29/2016 08/02/2016 07/27/2016 06/22/2016 75/64/3329  Systolic BP 518 - 841 660 630  Diastolic BP 70 - 60 80 82  Wt. (Lbs) 175 174 173 182.8 186  BMI 27.41 27.25 27.1 28.63 28.28   Foot/eye exam completion dates Latest Ref Rng & Units 01/16/2017  Eye Exam No Retinopathy No Retinopathy  Foot Form Completion - -  A1c is 6.1  James Dunn  reports that he has been smoking Cigarettes.  He has never used smokeless tobacco. He reports that he drinks alcohol. He reports that he does not use drugs.     Assessment &  Plan:    Need for prophylactic vaccination and inoculation against influenza - Plan: Flu vaccine HIGH DOSE PF (Fluzone High dose), CANCELED: Flu Vaccine QUAD 6+ mos PF IM (Fluarix Quad PF)  Diabetes mellitus due to underlying condition, uncontrolled, without complication ([place]) - Plan: HgB A1c  Hyperlipidemia associated with type 2 diabetes mellitus ([place])  Current smoker  Discussed smoking cessation with him and at this point he is not interested. Also discussed diet and exercise with him since he has gained a little bit of weight. Will check blood work next time he comes in.   1. Rx changes: none 2. Education: Reviewed 'ABCs' of diabetes management (respective goals in parentheses):  A1C (<7), blood pressure (<130/80), and cholesterol (LDL <100). 3. Compliance at present is estimated to be good. Efforts to improve compliance (if necessary) will be directed at increased exercise. 4. Follow up: 4 months

## 2017-05-16 ENCOUNTER — Other Ambulatory Visit: Payer: Self-pay | Admitting: Family Medicine

## 2017-05-16 DIAGNOSIS — E0865 Diabetes mellitus due to underlying condition with hyperglycemia: Secondary | ICD-10-CM

## 2017-05-16 DIAGNOSIS — E785 Hyperlipidemia, unspecified: Principal | ICD-10-CM

## 2017-05-16 DIAGNOSIS — E1169 Type 2 diabetes mellitus with other specified complication: Secondary | ICD-10-CM

## 2017-05-16 DIAGNOSIS — IMO0001 Reserved for inherently not codable concepts without codable children: Secondary | ICD-10-CM

## 2017-05-17 ENCOUNTER — Other Ambulatory Visit: Payer: Self-pay | Admitting: Family Medicine

## 2017-05-17 DIAGNOSIS — E119 Type 2 diabetes mellitus without complications: Secondary | ICD-10-CM

## 2017-07-18 ENCOUNTER — Other Ambulatory Visit (INDEPENDENT_AMBULATORY_CARE_PROVIDER_SITE_OTHER): Payer: PPO

## 2017-07-18 DIAGNOSIS — Z23 Encounter for immunization: Secondary | ICD-10-CM

## 2017-08-02 ENCOUNTER — Encounter: Payer: Self-pay | Admitting: Family Medicine

## 2017-08-02 ENCOUNTER — Ambulatory Visit (INDEPENDENT_AMBULATORY_CARE_PROVIDER_SITE_OTHER): Payer: PPO | Admitting: Family Medicine

## 2017-08-02 ENCOUNTER — Other Ambulatory Visit: Payer: Self-pay | Admitting: Family Medicine

## 2017-08-02 ENCOUNTER — Telehealth: Payer: Self-pay

## 2017-08-02 VITALS — BP 126/70 | HR 60 | Wt 180.0 lb

## 2017-08-02 DIAGNOSIS — I152 Hypertension secondary to endocrine disorders: Secondary | ICD-10-CM

## 2017-08-02 DIAGNOSIS — E0865 Diabetes mellitus due to underlying condition with hyperglycemia: Secondary | ICD-10-CM | POA: Diagnosis not present

## 2017-08-02 DIAGNOSIS — E1159 Type 2 diabetes mellitus with other circulatory complications: Secondary | ICD-10-CM

## 2017-08-02 DIAGNOSIS — Z136 Encounter for screening for cardiovascular disorders: Secondary | ICD-10-CM

## 2017-08-02 DIAGNOSIS — E1169 Type 2 diabetes mellitus with other specified complication: Secondary | ICD-10-CM

## 2017-08-02 DIAGNOSIS — IMO0001 Reserved for inherently not codable concepts without codable children: Secondary | ICD-10-CM

## 2017-08-02 DIAGNOSIS — F17200 Nicotine dependence, unspecified, uncomplicated: Secondary | ICD-10-CM | POA: Diagnosis not present

## 2017-08-02 DIAGNOSIS — E785 Hyperlipidemia, unspecified: Secondary | ICD-10-CM

## 2017-08-02 DIAGNOSIS — I10 Essential (primary) hypertension: Secondary | ICD-10-CM | POA: Diagnosis not present

## 2017-08-02 HISTORY — DX: Type 2 diabetes mellitus with other circulatory complications: E11.59

## 2017-08-02 HISTORY — DX: Hypertension secondary to endocrine disorders: I15.2

## 2017-08-02 LAB — POCT GLYCOSYLATED HEMOGLOBIN (HGB A1C): Hemoglobin A1C: 6.2

## 2017-08-02 LAB — POCT UA - MICROALBUMIN
Albumin/Creatinine Ratio, Urine, POC: <7.1
Creatinine, POC: 70.4 mg/dL
Microalbumin Ur, POC: <5 mg/L

## 2017-08-02 NOTE — Progress Notes (Signed)
  Subjective:    Patient ID: James Dunn, male    DOB: 08-31-1950, 67 y.o.   MRN: [id number]  James Dunn is a 67 y.o. male who presents for follow-up of Type 2 diabetes mellitus.  Patient is checking home blood sugars.   Home blood sugar records:92-140 How often is blood sugars being checked: three of four times a week Current symptoms/problems include none and have been unchanged. Daily foot checks: yes   Any foot concerns: no Last eye exam: last year Exercise: two to three a week  He continues on atorvastatin and is having no difficulty with that.  He is also taking his low-dose of lisinopril without problems.  Metformin is causing no GI problems.  He did take the phone number down for smoking cessation  and does plan on quitting in the near future.  The following portions of the patient's history were reviewed and updated as appropriate: allergies, current medications, past medical history, past social history and problem list.  ROS as in subjective above.     Objective:    Physical Exam Alert and in no distress.  Foot exam is recorded and is normal.   Lab Review Diabetic Labs Latest Ref Rng & Units 04/04/2017 11/29/2016 07/27/2016 05/11/2016 11/21/2011  HbA1c - 6.1 6.2 6.8 10.0 5.9  Microalbumin mg/L - - - 17.9 -  Micro/Creat Ratio - - - - 8.1 -  Chol <200 mg/dL - - - 168 150  HDL >40 mg/dL - - - 27(L) 32(L)  Calc LDL <100 mg/dL - - - 89 74  Triglycerides <150 mg/dL - - - 258(H) 219(H)  Creatinine 0.70 - 1.25 mg/dL - - - 1.26(H) 1.07   BP/Weight 04/04/2017 11/29/2016 08/02/2016 01/21/6961 02/23/2840  Systolic BP 324 401 - 027 253  Diastolic BP 80 70 - 60 80  Wt. (Lbs) 182.8 175 174 173 182.8  BMI 28.63 27.41 27.25 27.1 28.63   Foot/eye exam completion dates Latest Ref Rng & Units 01/16/2017  Eye Exam No Retinopathy No Retinopathy  Foot Form Completion - -  A1c is 6.2  [name]  reports that he has been smoking cigarettes.  he has never used smokeless tobacco. He reports that he  drinks about 4.2 oz of alcohol per week. He reports that he does not use drugs.     Assessment & Plan:    Diabetes mellitus due to underlying condition, uncontrolled, without complication ([place]) - Plan: CBC with Differential/Platelet, Comprehensive metabolic panel, Lipid panel, POCT glycosylated hemoglobin (Hb A1C), POCT UA - Microalbumin  Hyperlipidemia associated with type 2 diabetes mellitus ([place]) - Plan: Lipid panel  Current smoker  Hypertension associated with diabetes ([place]) - Plan: CBC with Differential/Platelet, Comprehensive metabolic panel  Screening for AAA (abdominal aortic aneurysm) - Plan: US Aorta 1.  2. Rx changes: none 3. Education: Reviewed 'ABCs' of diabetes management (respective goals in parentheses):  A1C (<7), blood pressure (<130/80), and cholesterol (LDL <100). 4. Compliance at present is estimated to be good. Efforts to improve compliance (if necessary) will be directed at increased exercise. 5. Follow up: 4 months I discussed smoking cessation with him in regard to keeping track of his smoking in regard to when, where and why.  Also recommend making a list of things to do instead of smoking for those time frames.  He will call the 800 number.  He is to come back here whenever he needs to for further discussion to help with his smoking cessation.

## 2017-08-02 NOTE — Patient Instructions (Signed)
Look at when where and why you smoke.  Make a list of things to do during those same time frames.  Come back here and I will work with you

## 2017-08-02 NOTE — Telephone Encounter (Signed)
Called pt to let him know when his U/S  Is at [place] at 10 am on Friday. Pt is not to eat or drink 6 hours prior to apt . Thanks [employer]

## 2017-08-03 LAB — CBC WITH DIFFERENTIAL/PLATELET
Basophils Absolute: 0 10*3/uL (ref 0.0–0.2)
Basos: 0 %
EOS (ABSOLUTE): 0.3 10*3/uL (ref 0.0–0.4)
Eos: 4 %
Hematocrit: 43.7 % (ref 37.5–51.0)
Hemoglobin: 14.5 g/dL (ref 13.0–17.7)
Immature Grans (Abs): 0 10*3/uL (ref 0.0–0.1)
Immature Granulocytes: 0 %
Lymphocytes Absolute: 2.4 10*3/uL (ref 0.7–3.1)
Lymphs: 29 %
MCH: 31.6 pg (ref 26.6–33.0)
MCHC: 33.2 g/dL (ref 31.5–35.7)
MCV: 95 fL (ref 79–97)
Monocytes Absolute: 0.7 10*3/uL (ref 0.1–0.9)
Monocytes: 9 %
Neutrophils Absolute: 4.8 10*3/uL (ref 1.4–7.0)
Neutrophils: 58 %
Platelets: 264 10*3/uL (ref 150–379)
RBC: 4.59 x10E6/uL (ref 4.14–5.80)
RDW: 13.6 % (ref 12.3–15.4)
WBC: 8.3 10*3/uL (ref 3.4–10.8)

## 2017-08-03 LAB — COMPREHENSIVE METABOLIC PANEL
ALT: 20 IU/L (ref 0–44)
AST: 13 IU/L (ref 0–40)
Albumin/Globulin Ratio: 1.9 (ref 1.2–2.2)
Albumin: 4.8 g/dL (ref 3.6–4.8)
Alkaline Phosphatase: 52 IU/L (ref 39–117)
BUN/Creatinine Ratio: 9 — ABNORMAL LOW (ref 10–24)
BUN: 10 mg/dL (ref 8–27)
Bilirubin Total: 0.5 mg/dL (ref 0.0–1.2)
CO2: 24 mmol/L (ref 20–29)
Calcium: 9.5 mg/dL (ref 8.6–10.2)
Chloride: 97 mmol/L (ref 96–106)
Creatinine, Ser: 1.11 mg/dL (ref 0.76–1.27)
GFR calc Af Amer: 79 mL/min/{1.73_m2} (ref >59)
GFR calc non Af Amer: 68 mL/min/{1.73_m2} (ref >59)
Globulin, Total: 2.5 g/dL (ref 1.5–4.5)
Glucose: 130 mg/dL — ABNORMAL HIGH (ref 65–99)
Potassium: 5.1 mmol/L (ref 3.5–5.2)
Sodium: 138 mmol/L (ref 134–144)
Total Protein: 7.3 g/dL (ref 6.0–8.5)

## 2017-08-03 LAB — LIPID PANEL
Chol/HDL Ratio: 3.7 ratio (ref 0.0–5.0)
Cholesterol, Total: 142 mg/dL (ref 100–199)
HDL: 38 mg/dL — ABNORMAL LOW (ref >39)
LDL Calculated: 67 mg/dL (ref 0–99)
Triglycerides: 187 mg/dL — ABNORMAL HIGH (ref 0–149)
VLDL Cholesterol Cal: 37 mg/dL (ref 5–40)

## 2017-08-04 ENCOUNTER — Ambulatory Visit (HOSPITAL_COMMUNITY): Admission: RE | Admit: 2017-08-04 | Payer: PPO | Source: Ambulatory Visit

## 2017-08-04 ENCOUNTER — Ambulatory Visit (HOSPITAL_COMMUNITY): Payer: PPO

## 2017-08-07 ENCOUNTER — Ambulatory Visit (HOSPITAL_COMMUNITY)
Admission: RE | Admit: 2017-08-07 | Discharge: 2017-08-07 | Disposition: A | Discharge status: Home / Self Care | Payer: PPO | Source: Ambulatory Visit | Attending: Family Medicine | Admitting: Family Medicine

## 2017-08-07 ENCOUNTER — Other Ambulatory Visit (HOSPITAL_COMMUNITY): Payer: PPO

## 2017-08-07 DIAGNOSIS — F17200 Nicotine dependence, unspecified, uncomplicated: Secondary | ICD-10-CM | POA: Insufficient documentation

## 2017-08-07 DIAGNOSIS — Z87891 Personal history of nicotine dependence: Secondary | ICD-10-CM | POA: Diagnosis not present

## 2017-08-07 DIAGNOSIS — Z136 Encounter for screening for cardiovascular disorders: Secondary | ICD-10-CM | POA: Insufficient documentation

## 2017-08-08 ENCOUNTER — Ambulatory Visit (HOSPITAL_COMMUNITY): Payer: PPO

## 2017-09-19 ENCOUNTER — Other Ambulatory Visit: Payer: PPO

## 2017-09-21 ENCOUNTER — Other Ambulatory Visit (INDEPENDENT_AMBULATORY_CARE_PROVIDER_SITE_OTHER): Payer: PPO

## 2017-09-21 DIAGNOSIS — Z23 Encounter for immunization: Secondary | ICD-10-CM

## 2017-11-07 ENCOUNTER — Other Ambulatory Visit: Payer: Self-pay | Admitting: Family Medicine

## 2017-11-07 DIAGNOSIS — E119 Type 2 diabetes mellitus without complications: Secondary | ICD-10-CM

## 2017-11-30 ENCOUNTER — Encounter: Payer: Self-pay | Admitting: Family Medicine

## 2017-11-30 ENCOUNTER — Ambulatory Visit (INDEPENDENT_AMBULATORY_CARE_PROVIDER_SITE_OTHER): Payer: PPO | Admitting: Family Medicine

## 2017-11-30 VITALS — BP 122/76 | HR 63 | Temp 97.6°F | Wt 181.8 lb

## 2017-11-30 DIAGNOSIS — J3489 Other specified disorders of nose and nasal sinuses: Secondary | ICD-10-CM

## 2017-11-30 DIAGNOSIS — E0865 Diabetes mellitus due to underlying condition with hyperglycemia: Secondary | ICD-10-CM | POA: Diagnosis not present

## 2017-11-30 DIAGNOSIS — I10 Essential (primary) hypertension: Secondary | ICD-10-CM | POA: Diagnosis not present

## 2017-11-30 DIAGNOSIS — F17200 Nicotine dependence, unspecified, uncomplicated: Secondary | ICD-10-CM

## 2017-11-30 DIAGNOSIS — E1159 Type 2 diabetes mellitus with other circulatory complications: Secondary | ICD-10-CM

## 2017-11-30 DIAGNOSIS — E785 Hyperlipidemia, unspecified: Secondary | ICD-10-CM

## 2017-11-30 DIAGNOSIS — E1169 Type 2 diabetes mellitus with other specified complication: Secondary | ICD-10-CM | POA: Diagnosis not present

## 2017-11-30 DIAGNOSIS — IMO0001 Reserved for inherently not codable concepts without codable children: Secondary | ICD-10-CM

## 2017-11-30 LAB — POCT GLYCOSYLATED HEMOGLOBIN (HGB A1C): Hemoglobin A1C: 6.2 % — AB (ref 4.0–5.6)

## 2017-11-30 NOTE — Progress Notes (Signed)
  Subjective:    Patient ID: James Dunn, male    DOB: [birth year], 67 y.o.   MRN: [id number]  James Dunn is a 67 y.o. male who presents for follow-up of Type 2 diabetes mellitus.  Patient is} checking home blood sugars.   Home blood sugar records: meter records can be as high as 160 How often is blood sugars being checked: 1 x a week Current symptoms/problems include none and have been unchanged. Daily foot checks: yes   Any foot concerns: no Last eye exam: end of 2018 or early 2019 Exercise: golf ,yard work He takes metformin but sometimes just takes it once per day.  He continues on Lipitor and is having no muscle aches or pains.  Lisinopril is also causing no trouble.  He does occasionally use albuterol for coughing.  Continues to smoke and is not interested in stopping.  He does have some lesions on his skin that he would like evaluated. The following portions of the patient's history were reviewed and updated as appropriate: allergies, current medications, past medical history, past social history and problem list.  ROS as in subjective above.     Objective:    Physical Exam Alert and in no distress.  He has a slightly waxy appearing lesion on the left part of his nose.  He also several lesions on his hands that he would like further evaluated.   Lab Review Diabetic Labs Latest Ref Rng & Units 08/02/2017 04/04/2017 11/29/2016 07/27/2016 05/11/2016  HbA1c - 6.2 6.1 6.2 6.8 10.0  Microalbumin mg/L <5.0 - - - 17.9  Micro/Creat Ratio - <7.1 - - - 8.1  Chol 100 - 199 mg/dL 142 - - - 168  HDL >39 mg/dL 38(L) - - - 27(L)  Calc LDL 0 - 99 mg/dL 67 - - - 89  Triglycerides 0 - 149 mg/dL 187(H) - - - 258(H)  Creatinine 0.76 - 1.27 mg/dL 1.11 - - - 1.26(H)   BP/Weight 11/30/2017 08/02/2017 04/04/2017 11/29/2016 0/62/6948  Systolic BP 546 270 350 093 -  Diastolic BP 76 70 80 70 -  Wt. (Lbs) 181.8 180 182.8 175 174  BMI 28.47 28.19 28.63 27.41 27.25   Foot/eye exam completion dates Latest  Ref Rng & Units 08/02/2017 01/16/2017  Eye Exam No Retinopathy - No Retinopathy  Foot Form Completion - Done -  A1c is 6.2  James Dunn  reports that he has been smoking cigarettes.  He has never used smokeless tobacco. He reports that he drinks about 4.2 oz of alcohol per week. He reports that he does not use drugs.     Assessment & Plan:    Diabetes mellitus due to underlying condition, uncontrolled, without complication ([place])  Hyperlipidemia associated with type 2 diabetes mellitus ([place])  Current smoker  Hypertension associated with diabetes ([place])  Nasal lesion - Plan: Ambulatory referral to Dermatology   1. Rx changes: none 2. Education: Reviewed 'ABCs' of diabetes management (respective goals in parentheses):  A1C (<7), blood pressure (<130/80), and cholesterol (LDL <100). 3. Compliance at present is estimated to be good. Efforts to improve compliance (if necessary) will be directed at increased exercise. 4. Follow up: 4 months Since his A1c looks good, I will not push for him to do more blood sugar testing.

## 2017-11-30 NOTE — Addendum Note (Signed)
Addended by: [name] on: 11/30/2017 11:28 AM   Modules accepted: Orders

## 2018-02-12 DIAGNOSIS — L819 Disorder of pigmentation, unspecified: Secondary | ICD-10-CM | POA: Diagnosis not present

## 2018-02-12 DIAGNOSIS — B079 Viral wart, unspecified: Secondary | ICD-10-CM | POA: Diagnosis not present

## 2018-02-12 DIAGNOSIS — L570 Actinic keratosis: Secondary | ICD-10-CM | POA: Diagnosis not present

## 2018-02-12 DIAGNOSIS — D485 Neoplasm of uncertain behavior of skin: Secondary | ICD-10-CM | POA: Diagnosis not present

## 2018-05-22 ENCOUNTER — Other Ambulatory Visit (INDEPENDENT_AMBULATORY_CARE_PROVIDER_SITE_OTHER): Payer: PPO

## 2018-05-22 DIAGNOSIS — Z23 Encounter for immunization: Secondary | ICD-10-CM

## 2018-06-01 ENCOUNTER — Other Ambulatory Visit: Payer: Self-pay | Admitting: Family Medicine

## 2018-06-01 DIAGNOSIS — IMO0001 Reserved for inherently not codable concepts without codable children: Secondary | ICD-10-CM

## 2018-06-01 DIAGNOSIS — E785 Hyperlipidemia, unspecified: Principal | ICD-10-CM

## 2018-06-01 DIAGNOSIS — E1169 Type 2 diabetes mellitus with other specified complication: Secondary | ICD-10-CM

## 2018-06-01 DIAGNOSIS — E0865 Diabetes mellitus due to underlying condition with hyperglycemia: Secondary | ICD-10-CM

## 2018-06-15 ENCOUNTER — Telehealth: Payer: Self-pay | Admitting: Family Medicine

## 2018-06-15 ENCOUNTER — Other Ambulatory Visit: Payer: Self-pay | Admitting: Family Medicine

## 2018-06-15 DIAGNOSIS — IMO0001 Reserved for inherently not codable concepts without codable children: Secondary | ICD-10-CM

## 2018-06-15 DIAGNOSIS — E1169 Type 2 diabetes mellitus with other specified complication: Secondary | ICD-10-CM

## 2018-06-15 DIAGNOSIS — E0865 Diabetes mellitus due to underlying condition with hyperglycemia: Principal | ICD-10-CM

## 2018-06-15 DIAGNOSIS — E785 Hyperlipidemia, unspecified: Principal | ICD-10-CM

## 2018-06-15 MED ORDER — ATORVASTATIN CALCIUM 10 MG PO TABS: 10.0000 mg | ORAL_TABLET | Freq: Every day | ORAL | 0 refills | Status: DC

## 2018-06-15 MED ORDER — LISINOPRIL 5 MG PO TABS: 5.0000 mg | ORAL_TABLET | Freq: Every day | ORAL | 0 refills | Status: DC

## 2018-06-15 NOTE — Telephone Encounter (Signed)
Pt called for med refill of Chol and bp meds [facility] [address].  Scheduled him a cpe in Jan.

## 2018-06-28 ENCOUNTER — Encounter: Payer: PPO | Admitting: Family Medicine

## 2018-08-15 DIAGNOSIS — L570 Actinic keratosis: Secondary | ICD-10-CM | POA: Diagnosis not present

## 2018-08-15 DIAGNOSIS — L578 Other skin changes due to chronic exposure to nonionizing radiation: Secondary | ICD-10-CM | POA: Diagnosis not present

## 2018-08-15 DIAGNOSIS — L821 Other seborrheic keratosis: Secondary | ICD-10-CM | POA: Diagnosis not present

## 2018-08-15 DIAGNOSIS — D1801 Hemangioma of skin and subcutaneous tissue: Secondary | ICD-10-CM | POA: Diagnosis not present

## 2018-09-12 ENCOUNTER — Other Ambulatory Visit: Payer: Self-pay | Admitting: Family Medicine

## 2018-09-12 ENCOUNTER — Telehealth: Payer: Self-pay | Admitting: Family Medicine

## 2018-09-12 DIAGNOSIS — E1169 Type 2 diabetes mellitus with other specified complication: Secondary | ICD-10-CM

## 2018-09-12 DIAGNOSIS — E785 Hyperlipidemia, unspecified: Principal | ICD-10-CM

## 2018-10-05 ENCOUNTER — Telehealth: Payer: Self-pay

## 2018-10-05 NOTE — Telephone Encounter (Signed)
LVM for pt to call back for screening and instructions on how to download Zoom for appt 10-10-18. [place]

## 2018-10-10 ENCOUNTER — Telehealth: Payer: Self-pay

## 2018-10-10 ENCOUNTER — Encounter: Payer: Self-pay | Admitting: Family Medicine

## 2018-10-10 ENCOUNTER — Other Ambulatory Visit: Payer: Self-pay

## 2018-10-10 ENCOUNTER — Ambulatory Visit (INDEPENDENT_AMBULATORY_CARE_PROVIDER_SITE_OTHER): Payer: PPO | Admitting: Family Medicine

## 2018-10-10 VITALS — Wt 183.0 lb

## 2018-10-10 DIAGNOSIS — D126 Benign neoplasm of colon, unspecified: Secondary | ICD-10-CM

## 2018-10-10 DIAGNOSIS — E1169 Type 2 diabetes mellitus with other specified complication: Secondary | ICD-10-CM | POA: Diagnosis not present

## 2018-10-10 DIAGNOSIS — E1159 Type 2 diabetes mellitus with other circulatory complications: Secondary | ICD-10-CM

## 2018-10-10 DIAGNOSIS — E785 Hyperlipidemia, unspecified: Secondary | ICD-10-CM | POA: Diagnosis not present

## 2018-10-10 DIAGNOSIS — I10 Essential (primary) hypertension: Secondary | ICD-10-CM

## 2018-10-10 DIAGNOSIS — J4520 Mild intermittent asthma, uncomplicated: Secondary | ICD-10-CM

## 2018-10-10 DIAGNOSIS — E119 Type 2 diabetes mellitus without complications: Secondary | ICD-10-CM | POA: Diagnosis not present

## 2018-10-10 DIAGNOSIS — Z86010 Personal history of colonic polyps: Secondary | ICD-10-CM

## 2018-10-10 DIAGNOSIS — N529 Male erectile dysfunction, unspecified: Secondary | ICD-10-CM | POA: Diagnosis not present

## 2018-10-10 DIAGNOSIS — IMO0001 Reserved for inherently not codable concepts without codable children: Secondary | ICD-10-CM

## 2018-10-10 DIAGNOSIS — F17200 Nicotine dependence, unspecified, uncomplicated: Secondary | ICD-10-CM | POA: Diagnosis not present

## 2018-10-10 DIAGNOSIS — E0865 Diabetes mellitus due to underlying condition with hyperglycemia: Secondary | ICD-10-CM | POA: Diagnosis not present

## 2018-10-10 DIAGNOSIS — J301 Allergic rhinitis due to pollen: Secondary | ICD-10-CM | POA: Diagnosis not present

## 2018-10-10 MED ORDER — LISINOPRIL 5 MG PO TABS: 5.0000 mg | ORAL_TABLET | Freq: Every day | ORAL | 1 refills | Status: DC

## 2018-10-10 MED ORDER — ATORVASTATIN CALCIUM 10 MG PO TABS: 10.0000 mg | ORAL_TABLET | Freq: Every day | ORAL | 1 refills | Status: DC

## 2018-10-10 MED ORDER — METFORMIN HCL 1000 MG PO TABS: 1000.0000 mg | ORAL_TABLET | Freq: Two times a day (BID) | ORAL | 1 refills | Status: DC

## 2018-10-10 NOTE — Telephone Encounter (Signed)
Pt was called to make an appt. For a nurse visit and a follow up for 6 months. [place]

## 2018-10-10 NOTE — Progress Notes (Signed)
James Dunn is a 68 y.o. male who presents for annual wellness visit and follow-up on chronic medical conditions.   Documentation for virtual telephone encounter.  Documentation for virtual audio and video telecommunications through Zoom encounter: The patient was located at home. The provider was located in the office. The patient did consent to this visit and is aware of possible charges through their insurance for this visit. The other persons participating in this telemedicine service were none. Time spent on call was 5 minutes and in review of previous records >27 minutes total. This virtual service is not related to other E/M service within previous 7 days. He does have underlying diabetes and does periodically check his blood sugars.  He has had 1 fasting reading in the 160 range otherwise he rarely checks it.  His exercise is revolving around golf and around the house.  He continues to drink at least 2 beverages on average per night.  He continues to smoke and is not interested in quitting.  His allergies seem to be under good control.  He does occasionally use albuterol to help with intermittent allergy related asthma.  He has a previous history of erectile dysfunction but is not any trouble with that recently.  He had a colonoscopy in 2018 and is scheduled for repeat in 2023.  Continues on atorvastatin, lisinopril, metformin and is having no difficulty with them.  He has no particular concerns or complaints.  His home life is stable.  Immunizations and Health Maintenance Immunization History  Administered Date(s) Administered  . DTaP 11/21/1991  . Influenza Split 06/28/2011  . Influenza, High Dose Seasonal PF 05/21/2015, 03/30/2016, 04/04/2017, 05/22/2018  . Influenza, Seasonal, Injecte, Preservative Fre 07/10/2012  . Influenza,inj,Quad PF,6+ Mos 04/02/2013  . Pneumococcal Conjugate-13 05/11/2016  . Pneumococcal Polysaccharide-23 01/17/2001  . Tdap 12/03/2009  . Zoster 11/21/2011   . Zoster Recombinat (Shingrix) 07/18/2017, 09/21/2017   Health Maintenance Due  Topic Date Due  . PNA vac Low Risk Adult (2 of 2 - PPSV23) 05/11/2017  . OPHTHALMOLOGY EXAM  01/16/2018  . HEMOGLOBIN A1C  06/01/2018  . FOOT EXAM  08/02/2018    Last colonoscopy:11-30-16 Last PSA: over two years Dentist: 06-2018 Ophtho: 01-16-17 Exercise: Golf, yard work  Other doctors caring for patient include: Dr Fuller Plan.   Advanced Directives:yes copy in chart Does Patient Have a Medical Advance Directive?: Yes Type of Advance Directive: Healthcare Power of Attorney, Living will Does patient want to make changes to medical advance directive?: No - Patient declined  Depression screen:  See questionnaire below.     Depression screen [facility] 2/9 10/10/2018 08/16/2016 08/02/2016 05/11/2016  Decreased Interest 0 0 0 0  Down, Depressed, Hopeless 0 0 0 0  PHQ - 2 Score 0 0 0 0    Fall Screen: See Questionaire below.   Fall Risk  10/10/2018 11/30/2017 08/16/2016 08/02/2016 05/11/2016  Falls in the past year? 0 No No No No    ADL screen:  See questionnaire below.  Functional Status Survey: Is the patient deaf or have difficulty hearing?: No Does the patient have difficulty seeing, even when wearing glasses/contacts?: No Does the patient have difficulty concentrating, remembering, or making decisions?: No Does the patient have difficulty walking or climbing stairs?: Yes(left hip pain on and off) Does the patient have difficulty dressing or bathing?: No Does the patient have difficulty doing errands alone such as visiting a doctor's office or shopping?: No   Review of Systems  Constitutional: -, -unexpected weight change, -anorexia, -  fatigue Allergy: sneezing, -itching, -congestion    PHYSICAL EXAM:  Wt 183 lb (83 kg)   BMI 28.66 kg/m   General Appearance: Alert, cooperative, no distress, appears stated age Head: Normocephalic, without obvious abnormality, atraumatic  Psych: Normal mood,  affect, hygiene and grooming  ASSESSMENT/PLAN: Current smoker  Controlled type 2 diabetes mellitus without complication, without long-term current use of insulin ([facility]) - Plan: CBC with Differential/Platelet, Comprehensive metabolic panel, Lipid panel, metFORMIN (GLUCOPHAGE) 1000 MG tablet  Erectile dysfunction, unspecified erectile dysfunction type  History of colonic polyps  Hyperlipidemia associated with type 2 diabetes mellitus ([place]) - Plan: Lipid panel, atorvastatin (LIPITOR) 10 MG tablet  Hypertension associated with diabetes ([place]) - Plan: CBC with Differential/Platelet, Comprehensive metabolic panel  Mild intermittent asthma without complication  Seasonal allergic rhinitis due to pollen  Tubular adenoma of colon  Diabetes mellitus due to underlying condition, uncontrolled, without complication, without long-term current use of insulin ([place]) - Plan: lisinopril (ZESTRIL) 5 MG tablet He will be set up for blood work in 1 month and repeat evaluation in 6 months.  He was comfortable with that. , recommended at least 30 minutes of aerobic activity at least 5 days/week; proper  healthy diet and alcohol recommendations (less than or equal to 2 drinks/day) reviewed;Immunization recommendations discussed.  Colonoscopy recommendations reviewed.   Medicare Attestation I have personally reviewed: The patient's medical and social history Their use of alcohol, tobacco or illicit drugs Their current medications and supplements The patient's functional ability including ADLs,fall risks, home safety risks, cognitive, and hearing and visual impairment Diet and physical activities Evidence for depression or mood disorders  The patient's weight, height, and BMI have been recorded in the chart.  I have made referrals, counseling, and provided education to the patient based on review of the above and I have provided the patient with a written personalized care plan for preventive services.      Jill Alexanders, MD   10/10/2018

## 2018-11-07 ENCOUNTER — Other Ambulatory Visit (INDEPENDENT_AMBULATORY_CARE_PROVIDER_SITE_OTHER): Payer: PPO

## 2018-11-07 ENCOUNTER — Other Ambulatory Visit: Payer: Self-pay

## 2018-11-07 DIAGNOSIS — E119 Type 2 diabetes mellitus without complications: Secondary | ICD-10-CM

## 2018-11-07 DIAGNOSIS — E1169 Type 2 diabetes mellitus with other specified complication: Secondary | ICD-10-CM | POA: Diagnosis not present

## 2018-11-07 DIAGNOSIS — E1159 Type 2 diabetes mellitus with other circulatory complications: Secondary | ICD-10-CM

## 2018-11-07 DIAGNOSIS — E785 Hyperlipidemia, unspecified: Secondary | ICD-10-CM | POA: Diagnosis not present

## 2018-11-07 DIAGNOSIS — I10 Essential (primary) hypertension: Secondary | ICD-10-CM | POA: Diagnosis not present

## 2018-11-07 DIAGNOSIS — I152 Hypertension secondary to endocrine disorders: Secondary | ICD-10-CM

## 2018-11-07 LAB — POCT GLYCOSYLATED HEMOGLOBIN (HGB A1C): Hemoglobin A1C: 6.7 % — AB (ref 4.0–5.6)

## 2018-11-08 LAB — CBC WITH DIFFERENTIAL/PLATELET
Basophils Absolute: 0.1 10*3/uL (ref 0.0–0.2)
Basos: 1 %
EOS (ABSOLUTE): 0.4 10*3/uL (ref 0.0–0.4)
Eos: 5 %
Hematocrit: 44.7 % (ref 37.5–51.0)
Hemoglobin: 15.8 g/dL (ref 13.0–17.7)
Immature Grans (Abs): 0.1 10*3/uL (ref 0.0–0.1)
Immature Granulocytes: 1 %
Lymphocytes Absolute: 2.6 10*3/uL (ref 0.7–3.1)
Lymphs: 31 %
MCH: 32.2 pg (ref 26.6–33.0)
MCHC: 35.3 g/dL (ref 31.5–35.7)
MCV: 91 fL (ref 79–97)
Monocytes Absolute: 0.7 10*3/uL (ref 0.1–0.9)
Monocytes: 9 %
Neutrophils Absolute: 4.6 10*3/uL (ref 1.4–7.0)
Neutrophils: 53 %
Platelets: 256 10*3/uL (ref 150–450)
RBC: 4.9 x10E6/uL (ref 4.14–5.80)
RDW: 12.7 % (ref 11.6–15.4)
WBC: 8.5 10*3/uL (ref 3.4–10.8)

## 2018-11-08 LAB — COMPREHENSIVE METABOLIC PANEL
ALT: 27 IU/L (ref 0–44)
AST: 20 IU/L (ref 0–40)
Albumin/Globulin Ratio: 2.2 (ref 1.2–2.2)
Albumin: 5 g/dL — ABNORMAL HIGH (ref 3.8–4.8)
Alkaline Phosphatase: 54 IU/L (ref 39–117)
BUN/Creatinine Ratio: 9 — ABNORMAL LOW (ref 10–24)
BUN: 10 mg/dL (ref 8–27)
Bilirubin Total: 0.6 mg/dL (ref 0.0–1.2)
CO2: 25 mmol/L (ref 20–29)
Calcium: 9.9 mg/dL (ref 8.6–10.2)
Chloride: 96 mmol/L (ref 96–106)
Creatinine, Ser: 1.15 mg/dL (ref 0.76–1.27)
GFR calc Af Amer: 75 mL/min/{1.73_m2} (ref >59)
GFR calc non Af Amer: 65 mL/min/{1.73_m2} (ref >59)
Globulin, Total: 2.3 g/dL (ref 1.5–4.5)
Glucose: 121 mg/dL — ABNORMAL HIGH (ref 65–99)
Potassium: 4.6 mmol/L (ref 3.5–5.2)
Sodium: 138 mmol/L (ref 134–144)
Total Protein: 7.3 g/dL (ref 6.0–8.5)

## 2018-11-08 LAB — LIPID PANEL
Chol/HDL Ratio: 4.2 ratio (ref 0.0–5.0)
Cholesterol, Total: 146 mg/dL (ref 100–199)
HDL: 35 mg/dL — ABNORMAL LOW (ref >39)
LDL Calculated: 71 mg/dL (ref 0–99)
Triglycerides: 202 mg/dL — ABNORMAL HIGH (ref 0–149)
VLDL Cholesterol Cal: 40 mg/dL (ref 5–40)

## 2019-01-08 NOTE — Telephone Encounter (Signed)
error 

## 2019-01-10 ENCOUNTER — Encounter: Payer: Self-pay | Admitting: Family Medicine

## 2019-01-10 ENCOUNTER — Other Ambulatory Visit: Payer: Self-pay

## 2019-01-10 ENCOUNTER — Ambulatory Visit (INDEPENDENT_AMBULATORY_CARE_PROVIDER_SITE_OTHER): Payer: PPO | Admitting: Family Medicine

## 2019-01-10 VITALS — BP 118/74 | HR 81 | Temp 98.4°F | Wt 170.0 lb

## 2019-01-10 DIAGNOSIS — N3000 Acute cystitis without hematuria: Secondary | ICD-10-CM | POA: Diagnosis not present

## 2019-01-10 DIAGNOSIS — E119 Type 2 diabetes mellitus without complications: Secondary | ICD-10-CM | POA: Diagnosis not present

## 2019-01-10 LAB — POCT URINALYSIS DIP (PROADVANTAGE DEVICE)
Bilirubin, UA: NEGATIVE
Blood, UA: NEGATIVE
Glucose, UA: >=1000 mg/dL — AB
Nitrite, UA: NEGATIVE
Protein Ur, POC: NEGATIVE mg/dL
Specific Gravity, Urine: 1.025
Urobilinogen, Ur: 0.2
pH, UA: 6 (ref 5.0–8.0)

## 2019-01-10 MED ORDER — CIPROFLOXACIN HCL 500 MG PO TABS: 500.0000 mg | ORAL_TABLET | Freq: Two times a day (BID) | ORAL | 0 refills | Status: DC

## 2019-01-10 NOTE — H&P (Addendum)
Subjective:    James Dunn is a 67 y.o. old male here for other (abd pain started a week ago) He first noticed the pain a week ago in the suprapubic area. He describes the pain a sharp without radiation. He states that it feels like a more severe form of the pain he feels when holding his bladder for too long. It occurs more than 10x a day and is relieved when going to the bathroom. Pain happens throughout the day and is not execerbated or relieved by activity.  Pain also wakes him up around 3x a night. He reports that he is not producing as much urine as usual due to the frequency however, he does not report hesitancy, decreased stream, penile pain, or back pain. Bowel movements have also increased in frequency, however, he reports no change in consistency, change in color, black tarry stools, blood, bloating, or pain during bowel movement. He is not taking any medications to relieve the pain. Nothing like this happened in the past.   HPI Chief Complaint  Patient presents with  . other    abd pain started a week ago    Review of Systems  Constitutional: Negative for appetite change, chills, fatigue, fever, malaise/fatigue, unexpected weight change and weight loss.  HENT: Negative for congestion and hearing loss.   Respiratory: Negative for cough, chest tightness and shortness of breath.   Cardiovascular: Negative for chest pain and leg swelling.  Gastrointestinal: Positive for abdominal pain. Negative for abdominal distention, anal bleeding, blood in stool, constipation, diarrhea, nausea, rectal pain and vomiting.  Endocrine: Positive for polyuria.  Genitourinary: Positive for frequency and urgency. Negative for decreased urine volume, difficulty urinating, discharge, flank pain, hematuria, penile pain, penile swelling, scrotal swelling and testicular pain.  Skin: Negative for rash.  Neurological: Negative for dizziness, weakness, light-headedness and headaches.    Review of Systems   Constitutional: Negative for appetite change, chills, fatigue, fever, malaise/fatigue, unexpected weight change and weight loss.  HENT: Negative for congestion and hearing loss.   Respiratory: Negative for cough, chest tightness and shortness of breath.   Cardiovascular: Negative for chest pain and leg swelling.  Gastrointestinal: Positive for abdominal pain. Negative for abdominal distention, anal bleeding, blood in stool, constipation, diarrhea, nausea, rectal pain and vomiting.  Genitourinary: Positive for frequency and urgency. Negative for decreased urine volume, difficulty urinating, discharge, flank pain, hematuria, penile pain, penile swelling, scrotal swelling and testicular pain.  Skin: Negative for rash.  Neurological: Negative for dizziness, weakness, light-headedness and headaches.     History and Problem List: James Dunn has ED (erectile dysfunction); Controlled type 2 diabetes mellitus without complication, without long-term current use of insulin ([place]); Current smoker; Hyperlipidemia associated with type 2 diabetes mellitus ([place]); History of colonic polyps; Seasonal allergic rhinitis due to pollen; Mild intermittent asthma without complication; Tubular adenoma of colon; and Hypertension associated with diabetes ([place]) on their problem list.  James Dunn  has a past medical history of Allergy, Asthma, Diabetes mellitus without complication ([place]), Diverticulosis, GERD (gastroesophageal reflux disease) (2002), and Health maintenance examination.      Objective:   Alert and in no distress.  Abdominal exam shows slight suprapubic tenderness.  Genitalia normal.  Rectal exam shows a normal-sized non-boggy prostate.  Urine dipstick showed glucose and white cells.     Assessment and Plan:  Controlled type 2 diabetes mellitus without complication, without long-term current use of insulin ([place]) - Plan: Since he is feeling sugar in his urine I recommended that he check his CBGs  much more often to  get a better feel for what is going on with his diabetes.  Acute cystitis without hematuria - Plan: Urine Culture, Ambulatory referral to Urology, ciprofloxacin (CIPRO) 500 MG tablet, POCT Urinalysis DIP (Proadvantage Device),    Patient was seen in conjunction with me.  Ozella Almond, Medical Student MS 3

## 2019-01-11 LAB — URINE CULTURE

## 2019-02-19 ENCOUNTER — Telehealth: Payer: Self-pay

## 2019-02-19 NOTE — Telephone Encounter (Signed)
Called pt to complete covid questions for appt next week [place].

## 2019-02-26 ENCOUNTER — Other Ambulatory Visit: Payer: Self-pay

## 2019-02-26 ENCOUNTER — Encounter: Payer: Self-pay | Admitting: Family Medicine

## 2019-02-26 ENCOUNTER — Ambulatory Visit (INDEPENDENT_AMBULATORY_CARE_PROVIDER_SITE_OTHER): Payer: PPO | Admitting: Family Medicine

## 2019-02-26 VITALS — BP 128/74 | HR 66 | Temp 97.1°F | Ht 67.0 in | Wt 180.4 lb

## 2019-02-26 DIAGNOSIS — F17200 Nicotine dependence, unspecified, uncomplicated: Secondary | ICD-10-CM

## 2019-02-26 DIAGNOSIS — E119 Type 2 diabetes mellitus without complications: Secondary | ICD-10-CM

## 2019-02-26 DIAGNOSIS — I10 Essential (primary) hypertension: Secondary | ICD-10-CM

## 2019-02-26 DIAGNOSIS — J0110 Acute frontal sinusitis, unspecified: Secondary | ICD-10-CM | POA: Diagnosis not present

## 2019-02-26 DIAGNOSIS — E785 Hyperlipidemia, unspecified: Secondary | ICD-10-CM

## 2019-02-26 DIAGNOSIS — E1159 Type 2 diabetes mellitus with other circulatory complications: Secondary | ICD-10-CM

## 2019-02-26 DIAGNOSIS — Z23 Encounter for immunization: Secondary | ICD-10-CM

## 2019-02-26 DIAGNOSIS — E1169 Type 2 diabetes mellitus with other specified complication: Secondary | ICD-10-CM

## 2019-02-26 DIAGNOSIS — I152 Hypertension secondary to endocrine disorders: Secondary | ICD-10-CM

## 2019-02-26 LAB — POCT URINALYSIS DIP (PROADVANTAGE DEVICE)
Bilirubin, UA: NEGATIVE
Blood, UA: NEGATIVE
Glucose, UA: NEGATIVE mg/dL
Nitrite, UA: NEGATIVE
Protein Ur, POC: NEGATIVE mg/dL
Specific Gravity, Urine: 1.02
Urobilinogen, Ur: 0.2
pH, UA: 6 (ref 5.0–8.0)

## 2019-02-26 LAB — POCT GLYCOSYLATED HEMOGLOBIN (HGB A1C): Hemoglobin A1C: 6.5 % — AB (ref 4.0–5.6)

## 2019-02-26 MED ORDER — AMOXICILLIN-POT CLAVULANATE 875-125 MG PO TABS: 1.0000 | ORAL_TABLET | Freq: Two times a day (BID) | ORAL | 0 refills | Status: DC

## 2019-02-26 NOTE — Progress Notes (Signed)
Subjective:    Patient ID: James Dunn, male    DOB: [birth year], 68 y.o.   MRN: [id number]  James Dunn is a 68 y.o. male who presents for follow-up of Type 2 diabetes mellitus.  Patient is checking home blood sugars.   Home blood sugar records: meter record How often is blood sugars being checked: once a week 109-140 Current symptoms/problems include none at this time. Daily foot checks: yes  Any foot concerns: none  Last eye exam: 01-16-17 Exercise: golf 3-4 times a week and yard work  He states that his urinary symptoms went away about a week after he was placed on an antibiotic.  He now complains of difficulty with sinuses. He has a 2-week history of sinus congestion occasional sneezing, PND and it has become purulent.  No fever, chills, history of allergies or upper tooth discomfort.  He smokes and is not interested in quitting at the present time. The following portions of the patient's history were reviewed and updated as appropriate: allergies, current medications, past medical history, past social history and problem list.  ROS as in subjective above.     Objective:    Physical Exam Alert and in no distress. Tympanic membranes difficult to see due to cerumen, canals are normal. Pharyngeal area is normal.  Nasal mucosa is red with nontender sinuses.  Neck is supple without adenopathy or thyromegaly. Cardiac exam shows a regular sinus rhythm without murmurs or gallops. Lungs are clear to auscultation. .   Lab Review Diabetic Labs Latest Ref Rng & Units 11/07/2018 11/30/2017 08/02/2017 04/04/2017 11/29/2016  HbA1c 4.0 - 5.6 % 6.7(A) 6.2(A) 6.2 6.1 6.2  Microalbumin mg/L - - <5.0 - -  Micro/Creat Ratio - - - <7.1 - -  Chol 100 - 199 mg/dL 146 - 142 - -  HDL >39 mg/dL 35(L) - 38(L) - -  Calc LDL 0 - 99 mg/dL 71 - 67 - -  Triglycerides 0 - 149 mg/dL 202(H) - 187(H) - -  Creatinine 0.76 - 1.27 mg/dL 1.15 - 1.11 - -   BP/Weight 02/26/2019 01/10/2019 11/07/2018 10/10/2018 99991111   Systolic BP 0000000 123456 123XX123 - 123XX123  Diastolic BP 74 74 80 - 76  Wt. (Lbs) 180.4 170 - 183 181.8  BMI 28.25 26.63 - 28.66 28.47   Foot/eye exam completion dates Latest Ref Rng & Units 08/02/2017 01/16/2017  Eye Exam No Retinopathy - No Retinopathy  Foot Form Completion - Done -  Hemoglobin A1c is 6.5 Urine microscopic was negative. James Dunn  reports that he has been smoking cigarettes. He has never used smokeless tobacco. He reports current alcohol use of about 7.0 standard drinks of alcohol per week. He reports that he does not use drugs.     Assessment & Plan:    Controlled type 2 diabetes mellitus without complication, without long-term current use of insulin ([facility])  Hyperlipidemia associated with type 2 diabetes mellitus ([place])  Hypertension associated with diabetes ([place])  Need for influenza vaccination - Plan: Flu Vaccine QUAD High Dose(Fluad)  Acute non-recurrent frontal sinusitis - Plan: amoxicillin-clavulanate (AUGMENTIN) 875-125 MG tablet  Current smoker   1. Rx changes: Augmentin 2. Education: Reviewed 'ABCs' of diabetes management (respective goals in parentheses):  A1C (<7), blood pressure (<130/80), and cholesterol (LDL <100). 3. Compliance at present is estimated to be good. Efforts to improve compliance (if necessary) will be directed at No change. 4. Follow up: 4 months now he can we can call him Discussed smoking cessation but he is  not interested. Although the urine is negative I still think it would be appropriate to see urology.

## 2019-02-26 NOTE — Patient Instructions (Signed)
Set up an appointment with your eye doctor.

## 2019-02-27 ENCOUNTER — Other Ambulatory Visit: Payer: Self-pay

## 2019-02-27 DIAGNOSIS — N3000 Acute cystitis without hematuria: Secondary | ICD-10-CM

## 2019-03-04 DIAGNOSIS — L578 Other skin changes due to chronic exposure to nonionizing radiation: Secondary | ICD-10-CM | POA: Diagnosis not present

## 2019-03-04 DIAGNOSIS — L218 Other seborrheic dermatitis: Secondary | ICD-10-CM | POA: Diagnosis not present

## 2019-03-04 DIAGNOSIS — D485 Neoplasm of uncertain behavior of skin: Secondary | ICD-10-CM | POA: Diagnosis not present

## 2019-03-04 DIAGNOSIS — D235 Other benign neoplasm of skin of trunk: Secondary | ICD-10-CM | POA: Diagnosis not present

## 2019-03-04 DIAGNOSIS — L570 Actinic keratosis: Secondary | ICD-10-CM | POA: Diagnosis not present

## 2019-03-04 HISTORY — PX: SKIN BIOPSY: SHX1

## 2019-04-16 ENCOUNTER — Encounter: Payer: Self-pay | Admitting: Family Medicine

## 2019-06-06 ENCOUNTER — Other Ambulatory Visit: Payer: Self-pay | Admitting: Family Medicine

## 2019-06-06 DIAGNOSIS — E1169 Type 2 diabetes mellitus with other specified complication: Secondary | ICD-10-CM

## 2019-06-06 DIAGNOSIS — E0865 Diabetes mellitus due to underlying condition with hyperglycemia: Secondary | ICD-10-CM

## 2019-06-06 DIAGNOSIS — E785 Hyperlipidemia, unspecified: Secondary | ICD-10-CM

## 2019-06-12 DIAGNOSIS — F1010 Alcohol abuse, uncomplicated: Secondary | ICD-10-CM | POA: Diagnosis not present

## 2019-07-08 ENCOUNTER — Encounter: Payer: Self-pay | Admitting: Family Medicine

## 2019-07-08 ENCOUNTER — Other Ambulatory Visit: Payer: Self-pay

## 2019-07-08 ENCOUNTER — Ambulatory Visit (INDEPENDENT_AMBULATORY_CARE_PROVIDER_SITE_OTHER): Payer: PPO | Admitting: Family Medicine

## 2019-07-08 VITALS — BP 116/74 | HR 70 | Temp 98.0°F | Wt 184.4 lb

## 2019-07-08 DIAGNOSIS — E1159 Type 2 diabetes mellitus with other circulatory complications: Secondary | ICD-10-CM

## 2019-07-08 DIAGNOSIS — Z87891 Personal history of nicotine dependence: Secondary | ICD-10-CM

## 2019-07-08 DIAGNOSIS — E119 Type 2 diabetes mellitus without complications: Secondary | ICD-10-CM

## 2019-07-08 DIAGNOSIS — E1169 Type 2 diabetes mellitus with other specified complication: Secondary | ICD-10-CM

## 2019-07-08 DIAGNOSIS — I10 Essential (primary) hypertension: Secondary | ICD-10-CM | POA: Diagnosis not present

## 2019-07-08 DIAGNOSIS — E785 Hyperlipidemia, unspecified: Secondary | ICD-10-CM | POA: Diagnosis not present

## 2019-07-08 DIAGNOSIS — J4520 Mild intermittent asthma, uncomplicated: Secondary | ICD-10-CM | POA: Diagnosis not present

## 2019-07-08 LAB — POCT GLYCOSYLATED HEMOGLOBIN (HGB A1C): Hemoglobin A1C: 6.7 % — AB (ref 4.0–5.6)

## 2019-07-08 LAB — POCT UA - MICROALBUMIN
Albumin/Creatinine Ratio, Urine, POC: 4.5
Creatinine, POC: 138.9 mg/dL
Microalbumin Ur, POC: 6.3 mg/L

## 2019-07-08 MED ORDER — ALBUTEROL SULFATE HFA 108 (90 BASE) MCG/ACT IN AERS: 2.0000 | INHALATION_SPRAY | Freq: Four times a day (QID) | RESPIRATORY_TRACT | 0 refills | Status: DC | PRN
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
## 2019-07-08 NOTE — Patient Instructions (Signed)
Take 2 Nexium daily for the next several weeks and pay attention to the types of things that get stuck.  If your symptoms go away leave alone but if they do not want to refer you to GI

## 2019-07-13 DIAGNOSIS — Z20822 Contact with and (suspected) exposure to covid-19: Secondary | ICD-10-CM | POA: Diagnosis not present

## 2019-07-13 DIAGNOSIS — Z03818 Encounter for observation for suspected exposure to other biological agents ruled out: Secondary | ICD-10-CM | POA: Diagnosis not present

## 2019-07-17 ENCOUNTER — Telehealth: Payer: Self-pay | Admitting: Family Medicine
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
## 2019-07-23 ENCOUNTER — Other Ambulatory Visit: Payer: Self-pay | Admitting: Family Medicine

## 2019-07-23 DIAGNOSIS — J4520 Mild intermittent asthma, uncomplicated: Secondary | ICD-10-CM

## 2019-07-23 NOTE — Telephone Encounter (Signed)
[facility] is requesting to fill pt al[facility]erol. Please advise KH 

## 2019-08-09 ENCOUNTER — Telehealth: Payer: Self-pay | Admitting: Family Medicine

## 2019-08-09 NOTE — Telephone Encounter (Signed)
Pt called and states that he has been doing your recommendations but issues with swallowing are getting worse. Pt is requesting a referral to GI. Please advise pt at [phone].

## 2019-08-09 NOTE — Telephone Encounter (Signed)
Refer to GI 

## 2019-08-12 ENCOUNTER — Other Ambulatory Visit: Payer: Self-pay

## 2019-08-12 DIAGNOSIS — R1310 Dysphagia, unspecified: Secondary | ICD-10-CM

## 2019-08-12 NOTE — Telephone Encounter (Signed)
Done KH 

## 2019-08-20 DIAGNOSIS — R140 Abdominal distension (gaseous): Secondary | ICD-10-CM | POA: Diagnosis not present

## 2019-08-20 DIAGNOSIS — Z86010 Personal history of colonic polyps: Secondary | ICD-10-CM | POA: Diagnosis not present

## 2019-08-20 DIAGNOSIS — R1310 Dysphagia, unspecified: Secondary | ICD-10-CM | POA: Diagnosis not present

## 2019-08-20 DIAGNOSIS — K219 Gastro-esophageal reflux disease without esophagitis: Secondary | ICD-10-CM | POA: Diagnosis not present

## 2019-08-20 DIAGNOSIS — K5730 Diverticulosis of large intestine without perforation or abscess without bleeding: Secondary | ICD-10-CM | POA: Diagnosis not present

## 2019-08-21 ENCOUNTER — Other Ambulatory Visit: Payer: Self-pay | Admitting: Gastroenterology

## 2019-08-21 DIAGNOSIS — R1310 Dysphagia, unspecified: Secondary | ICD-10-CM

## 2019-08-22 ENCOUNTER — Ambulatory Visit
Admission: RE | Admit: 2019-08-22 | Discharge: 2019-08-22 | Disposition: A | Discharge status: Home / Self Care | Payer: PPO | Source: Ambulatory Visit | Attending: Gastroenterology | Admitting: Gastroenterology

## 2019-08-22 DIAGNOSIS — R1310 Dysphagia, unspecified: Secondary | ICD-10-CM

## 2019-08-22 DIAGNOSIS — K222 Esophageal obstruction: Secondary | ICD-10-CM | POA: Diagnosis not present

## 2019-08-26 ENCOUNTER — Telehealth: Payer: Self-pay | Admitting: Family Medicine

## 2019-08-26 ENCOUNTER — Other Ambulatory Visit: Payer: Self-pay | Admitting: Gastroenterology

## 2019-08-26 NOTE — Telephone Encounter (Signed)
Received requested info from [facility]

## 2019-08-27 ENCOUNTER — Other Ambulatory Visit (HOSPITAL_COMMUNITY)
Admission: RE | Admit: 2019-08-27 | Discharge: 2019-08-27 | Disposition: A | Discharge status: Home / Self Care | Payer: PPO | Source: Ambulatory Visit | Attending: Gastroenterology | Admitting: Gastroenterology

## 2019-08-27 DIAGNOSIS — Z01812 Encounter for preprocedural laboratory examination: Secondary | ICD-10-CM | POA: Diagnosis not present

## 2019-08-27 DIAGNOSIS — Z20822 Contact with and (suspected) exposure to covid-19: Secondary | ICD-10-CM | POA: Diagnosis not present

## 2019-08-27 LAB — SARS CORONAVIRUS 2 (TAT 6-24 HRS): SARS Coronavirus 2: NEGATIVE

## 2019-08-30 ENCOUNTER — Ambulatory Visit (HOSPITAL_COMMUNITY): Payer: PPO | Admitting: Certified Registered"

## 2019-08-30 ENCOUNTER — Ambulatory Visit (HOSPITAL_COMMUNITY)
Admission: RE | Admit: 2019-08-30 | Discharge: 2019-08-30 | Disposition: A | Discharge status: Home / Self Care | Payer: PPO | Attending: Gastroenterology | Admitting: Gastroenterology

## 2019-08-30 ENCOUNTER — Encounter (HOSPITAL_COMMUNITY): Payer: Self-pay | Admitting: Gastroenterology

## 2019-08-30 ENCOUNTER — Encounter (HOSPITAL_COMMUNITY)
Admission: RE | Disposition: A | Discharge status: Home / Self Care | Payer: Self-pay | Source: Home / Self Care | Attending: Gastroenterology

## 2019-08-30 ENCOUNTER — Other Ambulatory Visit: Payer: Self-pay

## 2019-08-30 DIAGNOSIS — Z79899 Other long term (current) drug therapy: Secondary | ICD-10-CM | POA: Insufficient documentation

## 2019-08-30 DIAGNOSIS — J45909 Unspecified asthma, uncomplicated: Secondary | ICD-10-CM | POA: Insufficient documentation

## 2019-08-30 DIAGNOSIS — E119 Type 2 diabetes mellitus without complications: Secondary | ICD-10-CM | POA: Diagnosis not present

## 2019-08-30 DIAGNOSIS — K228 Other specified diseases of esophagus: Secondary | ICD-10-CM | POA: Diagnosis not present

## 2019-08-30 DIAGNOSIS — K219 Gastro-esophageal reflux disease without esophagitis: Secondary | ICD-10-CM | POA: Insufficient documentation

## 2019-08-30 DIAGNOSIS — Z87891 Personal history of nicotine dependence: Secondary | ICD-10-CM | POA: Diagnosis not present

## 2019-08-30 DIAGNOSIS — C155 Malignant neoplasm of lower third of esophagus: Secondary | ICD-10-CM | POA: Diagnosis not present

## 2019-08-30 DIAGNOSIS — I10 Essential (primary) hypertension: Secondary | ICD-10-CM | POA: Insufficient documentation

## 2019-08-30 DIAGNOSIS — R933 Abnormal findings on diagnostic imaging of other parts of digestive tract: Secondary | ICD-10-CM | POA: Diagnosis present

## 2019-08-30 DIAGNOSIS — E785 Hyperlipidemia, unspecified: Secondary | ICD-10-CM | POA: Diagnosis not present

## 2019-08-30 DIAGNOSIS — C159 Malignant neoplasm of esophagus, unspecified: Secondary | ICD-10-CM | POA: Diagnosis not present

## 2019-08-30 DIAGNOSIS — R1310 Dysphagia, unspecified: Secondary | ICD-10-CM | POA: Diagnosis not present

## 2019-08-30 HISTORY — PX: ESOPHAGOGASTRODUODENOSCOPY (EGD) WITH PROPOFOL: SHX5813

## 2019-08-30 HISTORY — PX: SUBMUCOSAL INJECTION: SHX5543

## 2019-08-30 HISTORY — PX: BIOPSY: SHX5522

## 2019-08-30 LAB — GLUCOSE, CAPILLARY: Glucose-Capillary: 155 mg/dL — ABNORMAL HIGH (ref 70–99)

## 2019-08-30 SURGERY — ESOPHAGOGASTRODUODENOSCOPY (EGD) WITH PROPOFOL
Anesthesia: Monitor Anesthesia Care

## 2019-08-30 MED ORDER — PROPOFOL 500 MG/50ML IV EMUL
INTRAVENOUS | Status: DC | PRN
  Administered 2019-08-30: 135 ug/kg/min via INTRAVENOUS

## 2019-08-30 MED ORDER — LACTATED RINGERS IV SOLN
INTRAVENOUS | Status: DC
  Administered 2019-08-30: 1000 mL via INTRAVENOUS

## 2019-08-30 MED ORDER — SODIUM CHLORIDE 0.9 % IV SOLN: INTRAVENOUS | Status: DC

## 2019-08-30 MED ORDER — SODIUM CHLORIDE (PF) 0.9 % IJ SOLN
PREFILLED_SYRINGE | INTRAMUSCULAR | Status: DC | PRN
  Administered 2019-08-30: 7 mL

## 2019-08-30 MED ORDER — LIDOCAINE 2% (20 MG/ML) 5 ML SYRINGE
INTRAMUSCULAR | Status: DC | PRN
  Administered 2019-08-30: 60 mg via INTRAVENOUS

## 2019-08-30 MED ORDER — PROPOFOL 10 MG/ML IV BOLUS
INTRAVENOUS | Status: DC | PRN
  Administered 2019-08-30: 10 mg via INTRAVENOUS
  Administered 2019-08-30 (×2): 20 mg via INTRAVENOUS
  Administered 2019-08-30: 10 mg via INTRAVENOUS

## 2019-08-30 SURGICAL SUPPLY — 15 items

## 2019-08-30 NOTE — Anesthesia Postprocedure Evaluation (Signed)
Anesthesia Post Note  Patient: LEROI HAQUE  Procedure(s) Performed: ESOPHAGOGASTRODUODENOSCOPY (EGD) WITH PROPOFOL (N/A ) UPPER ESOPHAGEAL ENDOSCOPIC ULTRASOUND (EUS) (N/A )     Patient location during evaluation: PACU Anesthesia Type: MAC Level of consciousness: awake and alert and oriented Pain management: pain level controlled Vital Signs Assessment: post-procedure vital signs reviewed and stable Respiratory status: spontaneous breathing, nonlabored ventilation and respiratory function stable Cardiovascular status: blood pressure returned to baseline Postop Assessment: no apparent nausea or vomiting Anesthetic complications: no    Last Vitals:  Vitals:   08/30/19 1250 08/30/19 1255  BP: (!) 182/79   Pulse: (!) 57 (!) 57  Resp: 18 16  Temp:    SpO2: 100% 100%    Last Pain:  Vitals:   08/30/19 1208  TempSrc: Oral  PainSc: 0-No pain                 Brennan Bailey
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
## 2019-08-30 NOTE — Anesthesia Preprocedure Evaluation (Addendum)
Anesthesia Evaluation  Patient identified by MRN, date of birth, ID band Patient awake    Reviewed: Allergy & Precautions, NPO status , Patient's Chart, lab work & pertinent test results  History of Anesthesia Complications Negative for: history of anesthetic complications  Airway Mallampati: II  TM Distance: >3 FB Neck ROM: Full    Dental  (+) Missing,    Pulmonary asthma , former smoker,    Pulmonary exam normal        Cardiovascular hypertension, Pt. on medications Normal cardiovascular exam     Neuro/Psych negative neurological ROS  negative psych ROS   GI/Hepatic Neg liver ROS, GERD  Controlled,dysphagia/abnormal barium swallow    Endo/Other  diabetes, Type 2  Renal/GU negative Renal ROS  negative genitourinary   Musculoskeletal negative musculoskeletal ROS (+)   Abdominal   Peds  Hematology negative hematology ROS (+)   Anesthesia Other Findings Day of surgery medications reviewed with patient.  Reproductive/Obstetrics negative OB ROS                            Anesthesia Physical Anesthesia Plan  ASA: II  Anesthesia Plan: MAC   Post-op Pain Management:    Induction:   PONV Risk Score and Plan: Treatment may vary due to age or medical condition and Propofol infusion  Airway Management Planned: Natural Airway and Nasal Cannula  Additional Equipment:   Intra-op Plan:   Post-operative Plan:   Informed Consent: I have reviewed the patients History and Physical, chart, labs and discussed the procedure including the risks, benefits and alternatives for the proposed anesthesia with the patient or authorized representative who has indicated his/her understanding and acceptance.       Plan Discussed with: CRNA  Anesthesia Plan Comments:        Anesthesia Quick Evaluation

## 2019-08-30 NOTE — Op Note (Signed)
[facility] Patient Name: James Dunn Procedure Date: 08/30/2019 MRN: [id number] Attending MD: [name] , MD Date of Birth: May 09, 1951 CSN: [id number] Age: 69 Admit Type: Outpatient Procedure:                Upper GI endoscopy Indications:              Abnormal UGI series Providers:                [name], MD, Cleda Daub, RN, Corie Chiquito,                            Technician, Maudry Diego, CRNA Referring MD:              Medicines:                Propofol per Anesthesia Complications:            No immediate complications. Estimated Blood Loss:     Estimated blood loss: 150 mL requiring treatment                            with epinephrine. Procedure:                Pre-Anesthesia Assessment:                           - Prior to the procedure, a History and Physical                            was performed, and patient medications and                            allergies were reviewed. The patient's tolerance of                            previous anesthesia was also reviewed. The risks                            and benefits of the procedure and the sedation                            options and risks were discussed with the patient.                            All questions were answered, and informed consent                            was obtained. Prior Anticoagulants: The patient has                            taken no previous anticoagulant or antiplatelet                            agents. ASA Grade Assessment: III - A patient with  severe systemic disease. After reviewing the risks                            and benefits, the patient was deemed in                            satisfactory condition to undergo the procedure.                           - Sedation was administered by an anesthesia                            professional. Deep sedation was attained.                           After obtaining informed  consent, the endoscope was                            passed under direct vision. Throughout the                            procedure, the patient's blood pressure, pulse, and                            oxygen saturations were monitored continuously. The                            [id number] Olympus gastroscope was                            introduced through the mouth, and advanced to the                            second part of duodenum. The upper GI endoscopy was                            technically difficult and complex. The patient                            tolerated the procedure well. Scope In: Scope Out: Findings:      A large, fungating mass with no bleeding and no stigmata of recent       bleeding was found in the lower third of the esophagus. The mass was       partially obstructing and partially circumferential (involving two       thirds of the lumen circumference). Biopsies were taken with a cold       forceps for histology. Area was successfully injected with 3 mL of a       1:10,000 solution of epinephrine for hemostasis of bleeding caused by       the procedure. Estimated blood loss: 150 mL requiring treatment with       epinephrine.      The stomach was normal.      The examined duodenum was normal.      A large two thirds circumfirential mass was noted  in the distal       esophagus. The length of the mass was 7 cm and it was extremely friable.       Passage of the endoscope induced bleeding. A total of 4 cold biopsies       were obtained and a copious amount of bleeding occurred. An estmated 150       ml of blood was suctioned. The bleeding was arrested with two       applications of [id number] Epi. A total of 3 ml was injected and some of       the epi was sprayed onto the lesion. The bleeding arrested at the end of       the procedure. Because of the significant bleeding, the decision was       made not to perform the EUS. 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                            Biopsied. Injected.                           - Normal stomach.                           - Normal examined duodenum. Moderate Sedation:      Not Applicable - Patient had care per Anesthesia. Recommendation:           - Patient has a contact number available for                            emergencies. The signs and symptoms of potential                            delayed complications were discussed with the                            patient. Return to normal activities tomorrow.                            Written discharge instructions were provided to the                            patient.                           - Resume previous diet.                           - Continue present medications.                           - Await pathology results.                           -  EUS at the next available appointment to assess                            disease activity.                           - CT of the Chest/ABM/Pelvis. Procedure Code(s):        --- Professional ---                           [phone], Esophagogastroduodenoscopy, flexible,                            transoral; with biopsy, single or multiple Diagnosis Code(s):        --- Professional ---                           C15.5, Malignant neoplasm of lower third of                            esophagus                           R93.3, Abnormal findings on diagnostic imaging of                            other parts of digestive tract CPT copyright 2019 [employer]. All rights reserved. The codes documented in this report are preliminary and upon coder review may  be revised to meet current compliance requirements. [name], MD [name], MD 08/30/2019 12:28:32 PM This report has been signed electronically. Number of Addenda: 0

## 2019-08-30 NOTE — Discharge Instructions (Signed)

## 2019-08-30 NOTE — Transfer of Care (Signed)
Immediate Anesthesia Transfer of Care Note  Patient: James Dunn  Procedure(s) Performed: ESOPHAGOGASTRODUODENOSCOPY (EGD) WITH PROPOFOL (N/A ) UPPER ESOPHAGEAL ENDOSCOPIC ULTRASOUND (EUS) (N/A )  Patient Location: PACU  Anesthesia Type:MAC  Level of Consciousness: awake, alert  and oriented  Airway & Oxygen Therapy: Patient Spontanous Breathing and Patient connected to face mask oxygen  Post-op Assessment: Report given to RN, Post -op Vital signs reviewed and stable and Patient moving all extremities X 4  Post vital signs: Reviewed and stable  Last Vitals:  Vitals Value Taken Time  BP 114/69 08/30/19 1208  Temp 36.3 C 08/30/19 1208  Pulse 70 08/30/19 1208  Resp 15 08/30/19 1208  SpO2 100 % 08/30/19 1208  Vitals shown include unvalidated device data.  Last Pain:  Vitals:   08/30/19 1208  TempSrc: Oral  PainSc: 0-No pain         Complications: No apparent anesthesia complications

## 2019-08-30 NOTE — Anesthesia Procedure Notes (Signed)
Procedure Name: MAC Date/Time: 08/30/2019 11:33 AM Performed by: Niel Hummer, CRNA Pre-anesthesia Checklist: Patient identified, Emergency Drugs available, Patient being monitored and Suction available Oxygen Delivery Method: Simple face mask

## 2019-09-01 ENCOUNTER — Encounter: Payer: Self-pay | Admitting: *Deleted

## 2019-09-02 ENCOUNTER — Other Ambulatory Visit: Payer: Self-pay | Admitting: Gastroenterology

## 2019-09-02 DIAGNOSIS — K228 Other specified diseases of esophagus: Secondary | ICD-10-CM

## 2019-09-02 DIAGNOSIS — K2289 Other specified disease of esophagus: Secondary | ICD-10-CM

## 2019-09-03 LAB — SURGICAL PATHOLOGY

## 2019-09-04 DIAGNOSIS — L819 Disorder of pigmentation, unspecified: Secondary | ICD-10-CM | POA: Diagnosis not present

## 2019-09-04 DIAGNOSIS — L218 Other seborrheic dermatitis: Secondary | ICD-10-CM | POA: Diagnosis not present

## 2019-09-04 DIAGNOSIS — L570 Actinic keratosis: Secondary | ICD-10-CM | POA: Diagnosis not present

## 2019-09-05 ENCOUNTER — Ambulatory Visit
Admission: RE | Admit: 2019-09-05 | Discharge: 2019-09-05 | Disposition: A | Discharge status: Home / Self Care | Payer: PPO | Source: Ambulatory Visit | Attending: Gastroenterology | Admitting: Gastroenterology

## 2019-09-05 ENCOUNTER — Other Ambulatory Visit: Payer: Self-pay

## 2019-09-05 DIAGNOSIS — K2289 Other specified disease of esophagus: Secondary | ICD-10-CM

## 2019-09-05 DIAGNOSIS — K228 Other specified diseases of esophagus: Secondary | ICD-10-CM

## 2019-09-05 DIAGNOSIS — C159 Malignant neoplasm of esophagus, unspecified: Secondary | ICD-10-CM | POA: Diagnosis not present

## 2019-09-05 MED ORDER — IOPAMIDOL (ISOVUE-300) INJECTION 61%
100.0000 mL | Freq: Once | INTRAVENOUS | Status: AC | PRN
  Administered 2019-09-05: 100 mL via INTRAVENOUS

## 2019-09-06 ENCOUNTER — Other Ambulatory Visit: Payer: Self-pay | Admitting: Gastroenterology

## 2019-09-06 NOTE — Progress Notes (Signed)
Spoke with patient regarding referral I received from Dr. [name].  I introduced myself and explained my role as GI nurse navigator, he was given my direct phone number for questions and concerns.  I wanted to schedule him for Thursday 3/25 however he has court that day, so I have him scheduled for Friday 3/26 at 3:20 with Dr. [name].  I explained that if an earlier appointment becomes available I will call him the beginning of the week.  He verbalized an understanding.

## 2019-09-09 NOTE — Progress Notes (Signed)
Spoke with patient about moving his appointment from Friday 3/26 to Wednesday 3/24 at 8:15.  He agrees to come in on Wednesday.  Instructed him to arrive at least 15 minutes prior to register.  He verbalized an understanding.

## 2019-09-10 ENCOUNTER — Other Ambulatory Visit (HOSPITAL_COMMUNITY)
Admission: RE | Admit: 2019-09-10 | Discharge: 2019-09-10 | Disposition: A | Discharge status: Home / Self Care | Payer: PPO | Source: Ambulatory Visit | Attending: Gastroenterology | Admitting: Gastroenterology

## 2019-09-10 DIAGNOSIS — E119 Type 2 diabetes mellitus without complications: Secondary | ICD-10-CM | POA: Diagnosis not present

## 2019-09-10 DIAGNOSIS — C155 Malignant neoplasm of lower third of esophagus: Secondary | ICD-10-CM | POA: Diagnosis not present

## 2019-09-10 DIAGNOSIS — Z7984 Long term (current) use of oral hypoglycemic drugs: Secondary | ICD-10-CM | POA: Diagnosis not present

## 2019-09-10 DIAGNOSIS — I10 Essential (primary) hypertension: Secondary | ICD-10-CM | POA: Diagnosis not present

## 2019-09-10 DIAGNOSIS — Z20822 Contact with and (suspected) exposure to covid-19: Secondary | ICD-10-CM | POA: Diagnosis not present

## 2019-09-10 DIAGNOSIS — Z87891 Personal history of nicotine dependence: Secondary | ICD-10-CM | POA: Diagnosis not present

## 2019-09-10 LAB — SARS CORONAVIRUS 2 (TAT 6-24 HRS): SARS Coronavirus 2: NEGATIVE

## 2019-09-10 NOTE — Progress Notes (Addendum)
[place]  Telephone:(336) [phone] Fax:([phone]) [place] Note   Patient Care Team: Denita Lung, MD as PCP - General (Family Medicine) [name], MD as Consulting Physician (Gastroenterology) [name], MD as Consulting Physician (Hematology) 09/11/2019  CHIEF COMPLAINTS/PURPOSE OF CONSULTATION:  Esophagus cancer, referred by Dr. [name], GI  HISTORY OF PRESENTING ILLNESS:  James Dunn 69 y.o. male with PMH significant for HTN, HL, T2DM, and current smoker presents with newly diagnosed esophagus cancer. His last colonoscopy on 11/2016 by Dr. Collene Mares revealed sigmoid diverticulosis, internal hemorrhoids, hyperplastic polyp in the sigmoid colon and tubular adenoma in the transverse colon; no prior EGD. He developed globus and solid dysphagia in 04/2019 without relief from PPI. Barium study on 08/22/19 showed long segment malignant-appearing stricture in the lower third of the thoracic esophagus extending to the esophagogastric junction. Upper endoscopy on 08/30/19 by Dr. [name] revealed a large, extremely friable, malignant appearing two thirds circumferential mass noted in the distal esophagus. EUS was not done due to copious amount of bleeding. Pathology showed adenocarcinoma, depth of invasion could not be determined but at least intramucosal. Staging CT CAP on 09/05/19 showed a soft tissue mass in the distal esophagus compatible with known malignancy with a 6 mm adjacent paraesophageal node, concerning for metastasis, and scattered nodes in the hepatoduodenal ligament and para-aortic regions and a perifissural node in the right middle lobe. 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 The ascending thoracic aorta measures 4 cm. He was referred to oncology for further work up and treatment.   Socially, he is married and lives with his wife of 40+ years. Has 1 child  who is healthy. Spouse is present for today's initial consult. He is retired from [employer]. He is active, plays golf, independent of ADLs. He quit smoking 03/2019 after 40 year 1+ PPD history. He drinks alcohol, 5 drinks per might most days. Smokes marijuana occasionally. He denies known family history of cancer.   Today, he presents with his spouse. He denies significant weight loss or fatigue. He remains active and functional. He has pre-existing hip pain that limits rigorous exercise. He has dysphagia with steaks, but can eat mostly regular diet, soft meats, bread, cereal, and liquids. Swallowing larger pills like Metformin are becoming more difficult. Denies n/v/c/d, bleeding, pain, fever, chills, cough, chest pain, dyspnea. He has baseline productive cough with yellow phlegm from years of smoking, no hematemesis.    MEDICAL HISTORY:  Past Medical History:  Diagnosis Date  . Allergy   . Asthma   . Diabetes mellitus without complication ([place])   . Diverticulosis   . GERD (gastroesophageal reflux disease) 2002  . Health maintenance examination     SURGICAL HISTORY: Past Surgical History:  Procedure Laterality Date  . BIOPSY  08/30/2019   Procedure: BIOPSY;  Surgeon: [name], MD;  Location: [facility];  Service: Endoscopy;;  . ESOPHAGOGASTRODUODENOSCOPY (EGD) WITH PROPOFOL N/A 08/30/2019   Procedure: ESOPHAGOGASTRODUODENOSCOPY (EGD) WITH PROPOFOL;  Surgeon: [name], MD;  Location: [facility];  Service: Endoscopy;  Laterality: N/A;  . SKIN BIOPSY N/A 03/04/2019   upper back Nerofibroma and follicular cyst   . SUBMUCOSAL INJECTION  08/30/2019   Procedure: SUBMUCOSAL INJECTION;  Surgeon: [name], MD;  Location: [facility];  Service: Endoscopy;;    SOCIAL HISTORY: Social History   Socioeconomic History  . Marital status: Married    Spouse name:  Not on file  . Number of children: Not on file  . Years of education: Not on file  . Highest education level: Not on  file  Occupational History  . Not on file  Tobacco Use  . Smoking status: Former Smoker    Types: Cigarettes  . Smokeless tobacco: Never Used  Substance and Sexual Activity  . Alcohol use: Yes    Alcohol/week: 7.0 standard drinks    Types: 7 Cans of beer per week  . Drug use: No  . Sexual activity: Yes  Other Topics Concern  . Not on file  Social History Narrative  . Not on file   Social Determinants of Health   Financial Resource Strain:   . Difficulty of Paying Living Expenses:   Food Insecurity:   . Worried About Charity fundraiser in the Last Year:   . Arboriculturist in the Last Year:   Transportation Needs:   . Film/video editor (Medical):   Marland Kitchen Lack of Transportation (Non-Medical):   Physical Activity:   . Days of Exercise per Week:   . Minutes of Exercise per Session:   Stress:   . Feeling of Stress :   Social Connections:   . Frequency of Communication with Friends and Family:   . Frequency of Social Gatherings with Friends and Family:   . Attends Religious Services:   . Active Member of Clubs or Organizations:   . Attends Archivist Meetings:   Marland Kitchen Marital Status:   Intimate Partner Violence:   . Fear of Current or Ex-Partner:   . Emotionally Abused:   Marland Kitchen Physically Abused:   . Sexually Abused:     FAMILY HISTORY: No family history on file.  ALLERGIES:  has No Known Allergies.  MEDICATIONS:  Current Outpatient Medications  Medication Sig Dispense Refill  . albuterol (VENTOLIN HFA) 108 (90 Base) MCG/ACT inhaler TAKE 2 PUFFS BY MOUTH EVERY 6 HOURS AS NEEDED FOR WHEEZE (Patient taking differently: Inhale 2 puffs into the lungs every 6 (six) hours as needed (wheezing/shortness of breath.). ) 18 g 0  . atorvastatin (LIPITOR) 10 MG tablet TAKE 1 TABLET BY MOUTH EVERY DAY (Patient taking differently: Take 10 mg by mouth daily. ) 90 tablet 1  . glucose blood test strip Use as instructed 100 each 12  . Lancet Devices (ONE TOUCH DELICA LANCING DEV)  MISC 1 each by Does not apply route 2 (two) times daily. 100 each 1  . Lancets (ONETOUCH ULTRASOFT) lancets Use as instructed 100 each 12  . lisinopril (ZESTRIL) 5 MG tablet TAKE 1 TABLET BY MOUTH EVERY DAY (Patient taking differently: Take 5 mg by mouth daily. ) 90 tablet 1  . metFORMIN (GLUCOPHAGE) 1000 MG tablet Take 1,000 mg by mouth daily.     No current facility-administered medications for this visit.    REVIEW OF SYSTEMS:   Constitutional: Denies fevers, chills or abnormal night sweats Eyes: Denies blurriness of vision, double vision or watery eyes Ears, nose, mouth, throat, and face: Denies mucositis or sore throat Respiratory: Denies dyspnea or wheezes (+) baseline productive cough  Cardiovascular: Denies palpitation, chest discomfort or lower extremity swelling Gastrointestinal:  Denies nausea, vomiting, constipation, diarrhea, pain, heartburn or change in bowel habits (+) globus (+) solid dysphagia  Skin: Denies abnormal skin rashes Lymphatics: Denies new lymphadenopathy or easy bruising Neurological:Denies numbness, tingling or new weaknesses Behavioral/Psych: Mood is stable, no new changes  All other systems were reviewed with the patient and are negative.  PHYSICAL EXAMINATION: ECOG PERFORMANCE STATUS: 1 - Symptomatic but completely ambulatory  Vitals:   09/11/19 0829  BP: 113/63  Pulse: 68  Resp: 18  Temp: 98.3 F (36.8 C)  SpO2: 100%   Filed Weights   09/11/19 0829  Weight: 178 lb 9.6 oz (81 kg)    GENERAL:alert, no distress and comfortable SKIN: no rash  EYES:  sclera clear NECK: without mass  LYMPH:  no palpable supraclavicular lymphadenopathy  LUNGS: clear with normal breathing effort HEART: regular rate & rhythm, no lower extremity edema ABDOMEN: abdomen soft, distended, non-tender and normal bowel sounds. Hepatomegaly  PSYCH: alert & oriented x 3 with fluent speech NEURO: no focal motor/sensory deficits  LABORATORY DATA:  I have reviewed the  data as listed CBC Latest Ref Rng & Units 11/07/2018 08/02/2017 05/11/2016  WBC 3.4 - 10.8 x10E3/uL 8.5 8.3 7.8  Hemoglobin 13.0 - 17.7 g/dL 15.8 14.5 15.7  Hematocrit 37.5 - 51.0 % 44.7 43.7 44.9  Platelets 150 - 450 x10E3/uL 256 264 231   CMP Latest Ref Rng & Units 11/07/2018 08/02/2017 05/11/2016  Glucose 65 - 99 mg/dL 121(H) 130(H) 179(H)  BUN 8 - 27 mg/dL 10 10 13   Creatinine 0.76 - 1.27 mg/dL 1.15 1.11 1.26(H)  Sodium 134 - 144 mmol/L 138 138 135  Potassium 3.5 - 5.2 mmol/L 4.6 5.1 4.5  Chloride 96 - 106 mmol/L 96 97 100  CO2 20 - 29 mmol/L 25 24 24   Calcium 8.6 - 10.2 mg/dL 9.9 9.5 9.3  Total Protein 6.0 - 8.5 g/dL 7.3 7.3 7.4  Total Bilirubin 0.0 - 1.2 mg/dL 0.6 0.5 0.9  Alkaline Phos 39 - 117 IU/L 54 52 54  AST 0 - 40 IU/L 20 13 22   ALT 0 - 44 IU/L 27 20 27     RADIOGRAPHIC STUDIES: I have personally reviewed the radiological images as listed and agreed with the findings in the report. CT CHEST W CONTRAST  Result Date: 09/05/2019 CLINICAL DATA:  New diagnosis of esophageal cancer. EXAM: CT CHEST, ABDOMEN, AND PELVIS WITH CONTRAST TECHNIQUE: Multidetector CT imaging of the chest, abdomen and pelvis was performed following the standard protocol during bolus administration of intravenous contrast. CONTRAST:  161mL ISOVUE-300 IOPAMIDOL (ISOVUE-300) INJECTION 61% COMPARISON:  Barium esophagram 08/22/2019. FINDINGS: CT CHEST FINDINGS Cardiovascular: The heart size is normal. No substantial pericardial effusion. Coronary artery calcification is evident. Atherosclerotic calcification is noted in the wall of the thoracic aorta. Ascending thoracic aorta is 4 cm diameter. Mediastinum/Nodes: No mediastinal lymphadenopathy. 6 mm short axis lower paraesophageal node is visible on image 48/series 2. Soft tissue mass noted distal esophagus, compatible with known neoplasm. No axillary lymphadenopathy. There is no hilar lymphadenopathy. Lungs/Pleura: Centrilobular and paraseptal emphysema evident. 6 mm  perifissural nodule noted right middle lobe on image 100/series 4, likely a subpleural lymph node. Tiny calcified granuloma noted left lower lobe on 140/4. A cluster of granulomata in scarring in the lateral left lower lobe is compatible with prior granulomatous involvement. Musculoskeletal: No worrisome lytic or sclerotic osseous abnormality. CT ABDOMEN PELVIS FINDINGS Hepatobiliary: The liver shows diffusely decreased attenuation suggesting fat deposition. Liver measures 18.2 cm craniocaudal length, enlarged. No suspicious focal lesion within the hepatic parenchyma. Gallbladder is nondistended. No intrahepatic or extrahepatic biliary dilation. Pancreas: No focal mass lesion. No dilatation of the main duct. No intraparenchymal cyst. No peripancreatic edema. 1.9 x 1.3 cm nodular collection of soft tissues identified posterior to the descending duodenum (see image 75/series 2. This is in close proximity to the pancreatic  head but a definite communication of parenchyma between these 2 structures is not discernible by CT. 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 Spleen: No splenomegaly. No focal mass lesion. Adrenals/Urinary Tract: No adrenal nodule or mass. 7 mm hypoattenuating lesion interpolar right kidney is too small to characterize but is most likely benign. Similar 4 mm cortical hypointensity in the lower pole left kidney cannot be characterized but is likely benign. No evidence for hydroureter. The urinary bladder appears normal for the degree of distention. Stomach/Bowel: Stomach is unremarkable. No gastric wall thickening. No evidence of outlet obstruction. Duodenum is normally positioned as is the ligament of Treitz. No small bowel wall thickening. No small bowel dilatation. The terminal ileum is normal. The appendix is normal. Diverticular changes are noted in the left colon without evidence of  diverticulitis. Vascular/Lymphatic: There is abdominal aortic atherosclerosis without aneurysm. Left-sided IVC evident, normal variant. 15 mm short axis hepatoduodenal ligament lymph node is mildly enlarged (image 70/2. No other mediastinal lymphadenopathy although upper normal right para-aortic nodes are visible on images 73 and 75 of series 2, measuring up to 12 mm short axis. No pelvic sidewall lymphadenopathy. Reproductive: The prostate gland and seminal vesicles are unremarkable. Other: No intraperitoneal free fluid. Musculoskeletal: Fatty lesion in the inferior aspect of the iliopsoas complex is compatible with lipoma. No worrisome lytic or sclerotic osseous abnormality. IMPRESSION: 1. Soft tissue mass noted distal esophagus, compatible with known neoplasm. There is a small 6 mm short axis adjacent paraesophageal node, concerning for metastatic disease. 2. Upper normal to mildly enlarged hepatoduodenal ligament lymph node, with upper normal right para-aortic nodes. As metastatic disease cannot be excluded, PET-CT may prove helpful to further evaluate. 3. 1.9 x 1.3 cm nodular collection of soft tissue posterior to the descending duodenum. This is in close proximity to the pancreatic head but a definite communication of parenchyma between these 2 structures is not discernible by CT. 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 Attention on follow-up recommended. 4. 6 mm perifissural nodule right middle lobe, likely a subpleural lymph node. Attention on follow-up recommended. 5. Hepatomegaly with hepatic steatosis. 6. Ascending thoracic aorta measures 4 cm diameter. Recommend annual imaging followup by CTA or MRA. This recommendation follows 2010 ACCF/AHA/AATS/ACR/ASA/SCA/SCAI/SIR/STS/SVM Guidelines for the Diagnosis and Management of Patients with Thoracic Aortic Disease. Circulation. 2010; [id number]. Aortic aneurysm NOS (ICD10-I71.9) 7. Left-sided IVC, normal variant.  Electronically Signed   By: Misty Stanley M.D.   On: 09/05/2019 10:38   CT ABDOMEN PELVIS W CONTRAST  Result Date: 09/05/2019 CLINICAL DATA:  New diagnosis of esophageal cancer. EXAM: CT CHEST, ABDOMEN, AND PELVIS WITH CONTRAST TECHNIQUE: Multidetector CT imaging of the chest, abdomen and pelvis was performed following the standard protocol during bolus administration of intravenous contrast. CONTRAST:  132mL ISOVUE-300 IOPAMIDOL (ISOVUE-300) INJECTION 61% COMPARISON:  Barium esophagram 08/22/2019. FINDINGS: CT CHEST FINDINGS Cardiovascular: The heart size is normal. No substantial pericardial effusion. Coronary artery calcification is evident. Atherosclerotic calcification is noted in the wall of the thoracic aorta. Ascending thoracic aorta is 4 cm diameter. Mediastinum/Nodes: No mediastinal lymphadenopathy. 6 mm short axis lower paraesophageal node is visible on image 48/series 2. Soft tissue mass noted distal esophagus, compatible with known neoplasm. No axillary lymphadenopathy. There is no hilar lymphadenopathy. Lungs/Pleura: Centrilobular and paraseptal emphysema evident. 6 mm perifissural nodule noted right middle lobe on image 100/series 4, likely a subpleural lymph node. Tiny calcified granuloma  noted left lower lobe on 140/4. A cluster of granulomata in scarring in the lateral left lower lobe is compatible with prior granulomatous involvement. Musculoskeletal: No worrisome lytic or sclerotic osseous abnormality. CT ABDOMEN PELVIS FINDINGS Hepatobiliary: The liver shows diffusely decreased attenuation suggesting fat deposition. Liver measures 18.2 cm craniocaudal length, enlarged. No suspicious focal lesion within the hepatic parenchyma. Gallbladder is nondistended. No intrahepatic or extrahepatic biliary dilation. Pancreas: No focal mass lesion. No dilatation of the main duct. No intraparenchymal cyst. No peripancreatic edema. 1.9 x 1.3 cm nodular collection of soft tissues identified posterior to the  descending duodenum (see image 75/series 2. This is in close proximity to the pancreatic head but a definite communication of parenchyma between these 2 structures is not discernible by CT. 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 Spleen: No splenomegaly. No focal mass lesion. Adrenals/Urinary Tract: No adrenal nodule or mass. 7 mm hypoattenuating lesion interpolar right kidney is too small to characterize but is most likely benign. Similar 4 mm cortical hypointensity in the lower pole left kidney cannot be characterized but is likely benign. No evidence for hydroureter. The urinary bladder appears normal for the degree of distention. Stomach/Bowel: Stomach is unremarkable. No gastric wall thickening. No evidence of outlet obstruction. Duodenum is normally positioned as is the ligament of Treitz. No small bowel wall thickening. No small bowel dilatation. The terminal ileum is normal. The appendix is normal. Diverticular changes are noted in the left colon without evidence of diverticulitis. Vascular/Lymphatic: There is abdominal aortic atherosclerosis without aneurysm. Left-sided IVC evident, normal variant. 15 mm short axis hepatoduodenal ligament lymph node is mildly enlarged (image 70/2. No other mediastinal lymphadenopathy although upper normal right para-aortic nodes are visible on images 73 and 75 of series 2, measuring up to 12 mm short axis. No pelvic sidewall lymphadenopathy. Reproductive: The prostate gland and seminal vesicles are unremarkable. Other: No intraperitoneal free fluid. Musculoskeletal: Fatty lesion in the inferior aspect of the iliopsoas complex is compatible with lipoma. No worrisome lytic or sclerotic osseous abnormality. IMPRESSION: 1. Soft tissue mass noted distal esophagus, compatible with known neoplasm. There is a small 6 mm short axis adjacent paraesophageal node, concerning  for metastatic disease. 2. Upper normal to mildly enlarged hepatoduodenal ligament lymph node, with upper normal right para-aortic nodes. As metastatic disease cannot be excluded, PET-CT may prove helpful to further evaluate. 3. 1.9 x 1.3 cm nodular collection of soft tissue posterior to the descending duodenum. This is in close proximity to the pancreatic head but a definite communication of parenchyma between these 2 structures is not discernible by CT. 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 Attention on follow-up recommended. 4. 6 mm perifissural nodule right middle lobe, likely a subpleural lymph node. Attention on follow-up recommended. 5. Hepatomegaly with hepatic steatosis. 6. Ascending thoracic aorta measures 4 cm diameter. Recommend annual imaging followup by CTA or MRA. This recommendation follows 2010 ACCF/AHA/AATS/ACR/ASA/SCA/SCAI/SIR/STS/SVM Guidelines for the Diagnosis and Management of Patients with Thoracic Aortic Disease. Circulation. 2010; [id number]. Aortic aneurysm NOS (ICD10-I71.9) 7. Left-sided IVC, normal variant. Electronically Signed   By: Misty Stanley M.D.   On: 09/05/2019 10:38   DG ESOPHAGUS W DOUBLE CM (HD)  Result Date: 08/22/2019 CLINICAL DATA:  Dysphagia with globus sensation in the throat and intermittent burning in the lower chest. EXAM: ESOPHOGRAM / BARIUM SWALLOW / BARIUM TABLET STUDY TECHNIQUE: Combined double contrast and single contrast examination  performed using effervescent crystals, thick barium liquid, and thin barium liquid. The patient was observed with fluoroscopy swallowing a 13 mm barium sulphate tablet. FLUOROSCOPY TIME:  Fluoroscopy Time:  3 minutes 42 seconds Radiation Exposure Index (if provided by the fluoroscopic device): 231 mGy Number of Acquired Spot Images: 15 COMPARISON:  None. FINDINGS: Normal oral and pharyngeal phases of swallowing, with no laryngeal penetration or tracheobronchial aspiration. No significant barium  retention in the pharynx. No evidence of pharyngeal mass, stricture or diverticulum. No significant cricopharyngeus muscle dysfunction. There is a long segment of markedly irregular luminal narrowing in the lower third of the thoracic esophagus extending to the esophagogastric junction, at which location the barium tablet became lodged and would not traverse into the stomach despite multiple water and barium swallows. Shelf-like margins are present at upper margin of this segment of irregular luminal narrowing. No hiatal hernia. No gastroesophageal reflux elicited. Mild esophageal dysmotility, with intermittent weakening of primary peristalsis in the mid to lower thoracic esophagus. IMPRESSION: Long segment malignant-appearing stricture in the lower third of the thoracic esophagus extending to the esophagogastric junction. Upper endoscopic correlation advised. These results will be called to the ordering clinician or representative by the Radiologist Assistant, and communication documented in the PACS or zVision Dashboard. Electronically Signed   By: Ilona Sorrel M.D.   On: 08/22/2019 08:53    ASSESSMENT & PLAN: 69 yo male with   1. Adenocarcinoma of distal esophagus -We reviewed his medical record in detail with the patient and family. EGD showed a large, fungating, partially circumferential mass in the lower third of the esophagus causing dysphagia. Despite this he is able to eat mostly normal diet, some meats are off limits, otherwise he has adequate nutrition for now. No significant weight loss.  -EUS was not done due to friability of the mass and significant bleeding, this has been rescheduled for 09/13/19 by Dr. [name].  -CT CAP showed borderline locoregional adenopathy but also in the periaortic region, which, if malignant, would be considered distant metastasis  -We are referring him for staging PET scan for further work up.  -We talked briefly about treatment for esophagus cancers in general  including chemo, radiotherapy, and surgery if this is localized. He understands surgery is likely the only way to potentially cure his cancer, and if not a surgical candidate then his treatment will be palliative. However, there is small change of complete response with chemoRT alone. We discussed the aggressive nature and high recurrence risk even after complete resection/response.  -Mr. Brannigan was encouraged to eat well and remain hydrated. He denies pain or other symptoms except mild dysphagia. He was encouraged to stay active. He has a good PS, would likely tolerate treatment well and he is interested in being aggressive.  -Baseline labs show mild normocytic normochromic anemia Hgb 12.6, normal iron studies. CMP shows elevated BG 223, he is on metformin only. Baseline CEA is elevated to 25. Will monitor on therapy.  -We will see him back after EUS and PET to review results, finalize staging and the care plan. We will refer him to radiation oncologist Dr. Lisbeth Renshaw and Dr. Servando Snare for surgical opinion as appropriate.   2. Solid dysphagia -started early 2021, secondary to #1 -he tolerates normal diet for the most part. Some meat and large pills get stuck  -no significant weight loss, I do not feel he needs feeding tube at this time, will monitor closely. Will refer to dietician as needed   3. Tobacco and alcohol use -  he has long term tobacco abuse, quit in 03/2019, and current alcohol use although he has cut back because it irritates his esophagus -he was strongly encouraged to remain abstinent from cigarettes and quit drinking from now on, especially during future treatment  4. HTN, HL, DM -Managed by PCP, on lisinopril, atorvastatin, and metformin -BG today 223  PLAN: -Work up reviewed -Lab today -Proceed with PET scan and EUS -F/u in 2 weeks to review results and discuss treatment, will make referrals at that time  Orders Placed This Encounter  Procedures  . NM PET Image Initial (PI) Skull  Base To Thigh    Standing Status:   Future    Standing Expiration Date:   09/10/2020    Order Specific Question:   ** REASON FOR EXAM (FREE TEXT)    Answer:   esophagus cancer, initial staging work up    Order Specific Question:   If indicated for the ordered procedure, I authorize the administration of a radiopharmaceutical per Radiology protocol    Answer:   Yes    Order Specific Question:   Preferred imaging location?    Answer:   [facility]    Order Specific Question:   Radiology Contrast Protocol - do NOT remove file path    Answer:   [email or web address]  . CBC with Differential ([facility] Only)    Standing Status:   Standing    Number of Occurrences:   50    Standing Expiration Date:   09/10/2020  . CMP ([facility] only)    Standing Status:   Standing    Number of Occurrences:   50    Standing Expiration Date:   09/10/2020  . [facility] (IN HOUSE-[facility])    Standing Status:   Standing    Number of Occurrences:   50    Standing Expiration Date:   09/10/2020  . Iron and TIBC    Standing Status:   Standing    Number of Occurrences:   50    Standing Expiration Date:   09/10/2020  . Ferritin    Standing Status:   Standing    Number of Occurrences:   50    Standing Expiration Date:   09/10/2020    All questions were answered. The patient knows to call the clinic with any problems, questions or concerns.     Alla Feeling, NP 09/11/2019   Addendum  I have seen the patient, examined him. I agree with the assessment and and plan and have edited the notes.   Mr. Clemmons is a 69 yo male with PMH of DM, HTN, former smoker, moderate alcohol drinker, presented with dysphagia, no significant weight loss or fatigue.  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  I personally reviewed his CT scan images, and discussed with patient and his wife.  Due to the indeterminate periaortic lymph nodes, I  recommend a PET scan to rule out distant nodal metastasis.  He is also scheduled for EUS, and possible rebiopsy by Dr. [name] this Friday. Plan to see him back after EUS and PET scan, to finalize his esophageal cancer treatment.    We discussed the indication of neoadjuvant chemoradiation for locally advanced disease, followed by esophagectomy.  We discussed the aggressive nature and high risk of recurrence after multidisciplinary treatment, and the surveillance plan. Pt seemed to be apprehensive about the aggressive nature of esophageal cancer, and the needs for multidisciplinary approach. I will  refer him to rad/onc and thoracic surgeon if needed based on his staging workup.  Patient finally voiced good understanding, and agreed with the plan.  All questions were answered.  [name]  09/11/2019

## 2019-09-11 ENCOUNTER — Inpatient Hospital Stay: Payer: PPO | Attending: Nurse Practitioner | Admitting: Nurse Practitioner

## 2019-09-11 ENCOUNTER — Encounter: Payer: Self-pay | Admitting: Nurse Practitioner

## 2019-09-11 ENCOUNTER — Inpatient Hospital Stay: Payer: PPO

## 2019-09-11 ENCOUNTER — Other Ambulatory Visit: Payer: Self-pay

## 2019-09-11 VITALS — BP 113/63 | HR 68 | Temp 98.3°F | Resp 18 | Wt 178.6 lb

## 2019-09-11 DIAGNOSIS — C155 Malignant neoplasm of lower third of esophagus: Secondary | ICD-10-CM | POA: Diagnosis not present

## 2019-09-11 DIAGNOSIS — R1310 Dysphagia, unspecified: Secondary | ICD-10-CM

## 2019-09-11 DIAGNOSIS — E119 Type 2 diabetes mellitus without complications: Secondary | ICD-10-CM

## 2019-09-11 DIAGNOSIS — Z7289 Other problems related to lifestyle: Secondary | ICD-10-CM

## 2019-09-11 DIAGNOSIS — I10 Essential (primary) hypertension: Secondary | ICD-10-CM

## 2019-09-11 DIAGNOSIS — Z87891 Personal history of nicotine dependence: Secondary | ICD-10-CM

## 2019-09-11 DIAGNOSIS — E785 Hyperlipidemia, unspecified: Secondary | ICD-10-CM | POA: Diagnosis not present

## 2019-09-11 LAB — IRON AND TIBC
Iron: 60 ug/dL (ref 42–163)
Saturation Ratios: 17 % — ABNORMAL LOW (ref 20–55)
TIBC: 348 ug/dL (ref 202–409)
UIBC: 288 ug/dL (ref 117–376)

## 2019-09-11 LAB — CBC WITH DIFFERENTIAL (CANCER CENTER ONLY)
Abs Immature Granulocytes: 0.06 10*3/uL (ref 0.00–0.07)
Basophils Absolute: 0.1 10*3/uL (ref 0.0–0.1)
Basophils Relative: 1 %
Eosinophils Absolute: 0.4 10*3/uL (ref 0.0–0.5)
Eosinophils Relative: 5 %
HCT: 37.5 % — ABNORMAL LOW (ref 39.0–52.0)
Hemoglobin: 12.6 g/dL — ABNORMAL LOW (ref 13.0–17.0)
Immature Granulocytes: 1 %
Lymphocytes Relative: 25 %
Lymphs Abs: 1.9 10*3/uL (ref 0.7–4.0)
MCH: 31 pg (ref 26.0–34.0)
MCHC: 33.6 g/dL (ref 30.0–36.0)
MCV: 92.1 fL (ref 80.0–100.0)
Monocytes Absolute: 0.7 10*3/uL (ref 0.1–1.0)
Monocytes Relative: 8 %
Neutro Abs: 4.6 10*3/uL (ref 1.7–7.7)
Neutrophils Relative %: 60 %
Platelet Count: 307 10*3/uL (ref 150–400)
RBC: 4.07 MIL/uL — ABNORMAL LOW (ref 4.22–5.81)
RDW: 12.1 % (ref 11.5–15.5)
WBC Count: 7.7 10*3/uL (ref 4.0–10.5)
nRBC: 0 % (ref 0.0–0.2)

## 2019-09-11 LAB — CMP (CANCER CENTER ONLY)
ALT: 27 U/L (ref 0–44)
AST: 16 U/L (ref 15–41)
Albumin: 4.1 g/dL (ref 3.5–5.0)
Alkaline Phosphatase: 56 U/L (ref 38–126)
Anion gap: 8 (ref 5–15)
BUN: 11 mg/dL (ref 8–23)
CO2: 25 mmol/L (ref 22–32)
Calcium: 9.3 mg/dL (ref 8.9–10.3)
Chloride: 104 mmol/L (ref 98–111)
Creatinine: 1.24 mg/dL (ref 0.61–1.24)
GFR, Est AFR Am: >60 mL/min (ref >60)
GFR, Estimated: 59 mL/min — ABNORMAL LOW (ref >60)
Glucose, Bld: 223 mg/dL — ABNORMAL HIGH (ref 70–99)
Potassium: 5 mmol/L (ref 3.5–5.1)
Sodium: 137 mmol/L (ref 135–145)
Total Bilirubin: 0.4 mg/dL (ref 0.3–1.2)
Total Protein: 7.1 g/dL (ref 6.5–8.1)

## 2019-09-11 LAB — FERRITIN: Ferritin: 219 ng/mL (ref 24–336)

## 2019-09-11 LAB — CEA (IN HOUSE-CHCC): CEA (CHCC-In House): 25.12 ng/mL — ABNORMAL HIGH (ref 0.00–5.00)

## 2019-09-11 NOTE — Progress Notes (Signed)
Met with patient and his wife Seth Bake today at initial medical oncology appointment with Cira Rue NP and Dr. [name].  I explained my role to both of them as nurse navigator.  They were given my card with my direct phone number and encouraged to call with any questions or concerns as they arise.  I escorted them to scheduling and to lab.

## 2019-09-12 ENCOUNTER — Ambulatory Visit: Payer: PPO | Admitting: Hematology

## 2019-09-13 ENCOUNTER — Ambulatory Visit (HOSPITAL_COMMUNITY)
Admission: RE | Admit: 2019-09-13 | Discharge: 2019-09-13 | Disposition: A | Discharge status: Home / Self Care | Payer: PPO | Attending: Gastroenterology | Admitting: Gastroenterology

## 2019-09-13 ENCOUNTER — Encounter (HOSPITAL_COMMUNITY)
Admission: RE | Disposition: A | Discharge status: Home / Self Care | Payer: Self-pay | Source: Home / Self Care | Attending: Gastroenterology

## 2019-09-13 ENCOUNTER — Encounter (HOSPITAL_COMMUNITY): Payer: Self-pay | Admitting: Gastroenterology

## 2019-09-13 ENCOUNTER — Ambulatory Visit: Payer: PPO | Admitting: Hematology

## 2019-09-13 ENCOUNTER — Ambulatory Visit (HOSPITAL_COMMUNITY): Payer: PPO | Admitting: Anesthesiology

## 2019-09-13 ENCOUNTER — Other Ambulatory Visit: Payer: Self-pay

## 2019-09-13 DIAGNOSIS — Z7984 Long term (current) use of oral hypoglycemic drugs: Secondary | ICD-10-CM | POA: Insufficient documentation

## 2019-09-13 DIAGNOSIS — I10 Essential (primary) hypertension: Secondary | ICD-10-CM | POA: Insufficient documentation

## 2019-09-13 DIAGNOSIS — E119 Type 2 diabetes mellitus without complications: Secondary | ICD-10-CM | POA: Insufficient documentation

## 2019-09-13 DIAGNOSIS — Z87891 Personal history of nicotine dependence: Secondary | ICD-10-CM | POA: Insufficient documentation

## 2019-09-13 DIAGNOSIS — Z20822 Contact with and (suspected) exposure to covid-19: Secondary | ICD-10-CM | POA: Diagnosis not present

## 2019-09-13 DIAGNOSIS — C155 Malignant neoplasm of lower third of esophagus: Secondary | ICD-10-CM | POA: Diagnosis not present

## 2019-09-13 DIAGNOSIS — C159 Malignant neoplasm of esophagus, unspecified: Secondary | ICD-10-CM | POA: Diagnosis not present

## 2019-09-13 HISTORY — PX: UPPER ESOPHAGEAL ENDOSCOPIC ULTRASOUND (EUS): SHX6562

## 2019-09-13 HISTORY — PX: ESOPHAGOGASTRODUODENOSCOPY (EGD) WITH PROPOFOL: SHX5813

## 2019-09-13 LAB — GLUCOSE, CAPILLARY: Glucose-Capillary: 129 mg/dL — ABNORMAL HIGH (ref 70–99)

## 2019-09-13 SURGERY — UPPER ESOPHAGEAL ENDOSCOPIC ULTRASOUND (EUS)
Anesthesia: Monitor Anesthesia Care

## 2019-09-13 MED ORDER — LACTATED RINGERS IV SOLN: INTRAVENOUS | Status: DC

## 2019-09-13 MED ORDER — PROPOFOL 500 MG/50ML IV EMUL
INTRAVENOUS | Status: AC
  Filled 2019-09-13: qty 50

## 2019-09-13 MED ORDER — PROPOFOL 500 MG/50ML IV EMUL
INTRAVENOUS | Status: DC | PRN
  Administered 2019-09-13: 150 ug/kg/min via INTRAVENOUS

## 2019-09-13 MED ORDER — PROMETHAZINE HCL 25 MG/ML IJ SOLN: 6.2500 mg | INTRAMUSCULAR | Status: DC | PRN

## 2019-09-13 MED ORDER — PROPOFOL 10 MG/ML IV BOLUS
INTRAVENOUS | Status: AC
  Filled 2019-09-13: qty 20

## 2019-09-13 MED ORDER — SODIUM CHLORIDE 0.9 % IV SOLN: INTRAVENOUS | Status: DC

## 2019-09-13 MED ORDER — PROPOFOL 10 MG/ML IV BOLUS
INTRAVENOUS | Status: DC | PRN
  Administered 2019-09-13: 20 mg via INTRAVENOUS
  Administered 2019-09-13 (×3): 10 mg via INTRAVENOUS

## 2019-09-13 MED ORDER — ONDANSETRON HCL 4 MG/2ML IJ SOLN
INTRAMUSCULAR | Status: DC | PRN
  Administered 2019-09-13: 4 mg via INTRAVENOUS

## 2019-09-13 NOTE — Discharge Instructions (Signed)

## 2019-09-13 NOTE — Anesthesia Postprocedure Evaluation (Signed)
Anesthesia Post Note  Patient: CANDON CARAS  Procedure(s) Performed: UPPER ESOPHAGEAL ENDOSCOPIC ULTRASOUND (EUS) (N/A )     Patient location during evaluation: PACU Anesthesia Type: MAC Level of consciousness: awake and alert Pain management: pain level controlled Vital Signs Assessment: post-procedure vital signs reviewed and stable Respiratory status: spontaneous breathing, nonlabored ventilation, respiratory function stable and patient connected to nasal cannula oxygen Cardiovascular status: stable and blood pressure returned to baseline Postop Assessment: no apparent nausea or vomiting Anesthetic complications: no    Last Vitals:  Vitals:   09/13/19 1120 09/13/19 1130  BP: (!) 144/84 132/82  Pulse: 65 62  Resp: 12 15  Temp: 36.5 C   SpO2: 98% 98%    Last Pain:  Vitals:   09/13/19 1130  TempSrc:   PainSc: 0-No pain                 Effie Berkshire

## 2019-09-13 NOTE — Interval H&P Note (Signed)
History and Physical Interval Note:  09/13/2019 10:07 AM  James Dunn  has presented today for surgery, with the diagnosis of esophageal cancer.  The various methods of treatment have been discussed with the patient and family. After consideration of risks, benefits and other options for treatment, the patient has consented to  Procedure(s): UPPER ESOPHAGEAL ENDOSCOPIC ULTRASOUND (EUS) (N/A) as a surgical intervention.  The patient's history has been reviewed, patient examined, no change in status, stable for surgery.  I have reviewed the patient's chart and labs.  Questions were answered to the patient's satisfaction.     [name] D

## 2019-09-13 NOTE — Anesthesia Preprocedure Evaluation (Addendum)
Anesthesia Evaluation  Patient identified by MRN, date of birth, ID band Patient awake    Reviewed: Allergy & Precautions, NPO status , Patient's Chart, lab work & pertinent test results  Airway Mallampati: III       Dental no notable dental hx.    Pulmonary asthma , former smoker,    Pulmonary exam normal        Cardiovascular hypertension, Pt. on medications Normal cardiovascular exam     Neuro/Psych negative neurological ROS  negative psych ROS   GI/Hepatic Neg liver ROS, GERD  ,  Endo/Other  diabetes, Type 2, Oral Hypoglycemic Agents  Renal/GU negative Renal ROS     Musculoskeletal negative musculoskeletal ROS (+)   Abdominal Normal abdominal exam  (+)   Peds  Hematology negative hematology ROS (+)   Anesthesia Other Findings   Reproductive/Obstetrics                            Anesthesia Physical Anesthesia Plan  ASA: II  Anesthesia Plan: MAC   Post-op Pain Management:    Induction: Intravenous  PONV Risk Score and Plan: 2 and Ondansetron and Propofol infusion  Airway Management Planned: Natural Airway and Simple Face Mask  Additional Equipment: None  Intra-op Plan:   Post-operative Plan: Extubation in OR  Informed Consent: I have reviewed the patients History and Physical, chart, labs and discussed the procedure including the risks, benefits and alternatives for the proposed anesthesia with the patient or authorized representative who has indicated his/her understanding and acceptance.       Plan Discussed with: CRNA  Anesthesia Plan Comments:        Anesthesia Quick Evaluation

## 2019-09-13 NOTE — Transfer of Care (Signed)
Immediate Anesthesia Transfer of Care Note  Patient: ZENITH LAMPHIER  Procedure(s) Performed: UPPER ESOPHAGEAL ENDOSCOPIC ULTRASOUND (EUS) (N/A )  Patient Location: PACU  Anesthesia Type:MAC  Level of Consciousness: sedated  Airway & Oxygen Therapy: Patient Spontanous Breathing and Patient connected to face mask oxygen  Post-op Assessment: Report given to RN and Post -op Vital signs reviewed and stable  Post vital signs: Reviewed and stable  Last Vitals:  Vitals Value Taken Time  BP    Temp    Pulse 64 09/13/19 1108  Resp 16 09/13/19 1108  SpO2 100 % 09/13/19 1108  Vitals shown include unvalidated device data.  Last Pain:  Vitals:   09/13/19 0945  TempSrc: Oral  PainSc: 0-No pain         Complications: No apparent anesthesia complications

## 2019-09-13 NOTE — Op Note (Signed)
[facility] Patient Name: James Dunn Procedure Date: 09/13/2019 MRN: [id number] Attending MD: [name] , MD Date of Birth: 07-Oct-1950 CSN: [id number] Age: 69 Admit Type: Outpatient Procedure:                Upper EUS Indications:              Staging of esophageal adenocarcinoma Providers:                [name], MD, Cleda Daub, RN, Janeece Agee,                            Technician Referring MD:              Medicines:                Propofol per Anesthesia Complications:            No immediate complications. Estimated Blood Loss:     Estimated blood loss was minimal. Procedure:                Pre-Anesthesia Assessment:                           - Prior to the procedure, a History and Physical                            was performed, and patient medications and                            allergies were reviewed. The patient's tolerance of                            previous anesthesia was also reviewed. The risks                            and benefits of the procedure and the sedation                            options and risks were discussed with the patient.                            All questions were answered, and informed consent                            was obtained. Prior Anticoagulants: The patient has                            taken no previous anticoagulant or antiplatelet                            agents. ASA Grade Assessment: III - A patient with                            severe systemic disease. After reviewing the risks  and benefits, the patient was deemed in                            satisfactory condition to undergo the procedure.                           - Sedation was administered by an anesthesia                            professional. Deep sedation was attained.                           After obtaining informed consent, the endoscope was                            passed under direct vision.  Throughout the                            procedure, the patient's blood pressure, pulse, and                            oxygen saturations were monitored continuously. The                            [id number] Olympus Linear EUS scope was                            introduced through the mouth, and advanced to the                            second part of duodenum. The upper EUS was                            accomplished without difficulty. The patient                            tolerated the procedure well. Scope In: Scope Out: Findings:      ENDOSONOGRAPHIC FINDING: :      A hypoechoic mass was found in the lower third of the esophagus. The       mass was encountered at 36 cm from the incisors and extended to 40 cm.       The lesion was circumferential. The endosonographic borders were       irregular. The mass measured up to 26 mm in thickness. There was       sonographic evidence suggesting invasion into the adventitia (Layer 5).      The large friable distal esophageal adenocarcinoma again identified.       With gentle pressure and maneuvering, both the linear and radial       echoendoscopes were able to pass through the area with very little       resistance. The mass was circumfirential on EUS, but there was eccentric       growth. The lesion extended beyond the muscularis propria and to the       adventitia (images 12 and 13). At least three distinct peritumoral  lymph       nodes were found measuring 7-12 mm. No other supicious lesions were       found in the upper GI tract. The heterotopic pancreatic tissue noted on       the CT scan appeared to be continguous with the entire pancreas. There       was lobularity in this area. Impression:               - A mass was found in the lower third of the                            esophagus. A tissue diagnosis was obtained prior to                            this exam. This is of adenocarcinoma. This was                             staged T3 N3 Mx by endosonographic criteria.                           - No specimens collected. Moderate Sedation:      Not Applicable - Patient had care per Anesthesia. Recommendation:           - Patient has a contact number available for                            emergencies. The signs and symptoms of potential                            delayed complications were discussed with the                            patient. Return to normal activities tomorrow.                            Written discharge instructions were provided to the                            patient.                           - Resume previous diet.                           - Further management per Oncology. Procedure Code(s):        --- Professional ---                           [phone], Esophagogastroduodenoscopy, flexible,                            transoral; with endoscopic ultrasound examination                            limited to the esophagus, stomach or duodenum, and  adjacent structures Diagnosis Code(s):        --- Professional ---                           C15.5, Malignant neoplasm of lower third of                            esophagus CPT copyright 2019 [employer]. All rights reserved. The codes documented in this report are preliminary and upon coder review may  be revised to meet current compliance requirements. [name], MD [name], MD 09/13/2019 11:19:59 AM This report has been signed electronically. Number of Addenda: 0

## 2019-09-16 ENCOUNTER — Other Ambulatory Visit: Payer: Self-pay | Admitting: Family Medicine

## 2019-09-16 ENCOUNTER — Other Ambulatory Visit: Payer: PPO

## 2019-09-16 ENCOUNTER — Encounter: Payer: Self-pay | Admitting: *Deleted

## 2019-09-16 DIAGNOSIS — E119 Type 2 diabetes mellitus without complications: Secondary | ICD-10-CM

## 2019-09-19 NOTE — Progress Notes (Signed)
[place]   [phone] [phone]   Clinic Follow up Note   Patient Care Team: Denita Lung, MD as PCP - General (Family Medicine) [name], MD as Consulting Physician (Gastroenterology) [name], MD as Consulting Physician (Hematology) Jonnie Finner, RN as Oncology Nurse Navigator  Date of Service:  09/23/2019  CHIEF COMPLAINT: F/u of Esophagus cancer  SUMMARY OF ONCOLOGIC HISTORY: Oncology History  Esophageal cancer ([place])  08/30/2019 Procedure   EGD by Dr. [name] 08/30/19  IMPRESSION - Partially obstructing, malignant esophageal tumor was found in the lower third of the esophagus. Biopsied. Injected. - Normal stomach. - Normal examined duodenum.   08/30/2019 Initial Biopsy   FINAL MICROSCOPIC DIAGNOSIS:   A. ESOPHAGUS, BIOPSY:  - At least intramucosal adenocarcinoma.  - See comment.   COMMENT:   - The depth of invasion can not be determined due to the superficial  nature of the biopsy.  Dr.  Vic Ripper has reviewed the case and concurs  with this interpretation.  Additional studies can be performed upon  clinician request.    09/05/2019 Imaging   CT CAP W Contrast 09/05/19  IMPRESSION: 1. Soft tissue mass noted distal esophagus, compatible with known neoplasm. There is a small 6 mm short axis adjacent paraesophageal node, concerning for metastatic disease. 2. Upper normal to mildly enlarged hepatoduodenal ligament lymph node, with upper normal right para-aortic nodes. As metastatic disease cannot be excluded, PET-CT may prove helpful to further evaluate. 3. 1.9 x 1.3 cm nodular collection of soft tissue posterior to the descending duodenum. This is in close proximity to the pancreatic head but a definite communication of parenchyma between these 2 structures is not discernible by CT. 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 Attention on follow-up recommended. 4. 6 mm perifissural nodule  right middle lobe, likely a subpleural lymph node. Attention on follow-up recommended. 5. Hepatomegaly with hepatic steatosis. 6. Ascending thoracic aorta measures 4 cm diameter. Recommend annual imaging followup by CTA or MRA. This recommendation follows 2010 ACCF/AHA/AATS/ACR/ASA/SCA/SCAI/SIR/STS/SVM Guidelines for the Diagnosis and Management of Patients with Thoracic Aortic Disease. Circulation. 2010; [id number]. Aortic aneurysm NOS (ICD10-I71.9) 7. Left-sided IVC, normal variant.   09/11/2019 Tumor Marker   Baseline CEA 25.12 on 09/11/19   09/13/2019 Cancer Staging   Staging form: Esophagus - Adenocarcinoma, AJCC 8th Edition - Clinical stage from 09/13/2019: Stage IVA (cT3, cN2, cM0) - Signed by [name], MD on 09/22/2019   09/13/2019 Procedure   EUS by Dr. [name] 09/13/19  IMPRESSION - A mass was found in the lower third of the esophagus. A tissue diagnosis was obtained prior to this exam. This is of adenocarcinoma. This was staged T3 N3 Mx by endosonographic criteria. - No specimens collected.   09/20/2019 PET scan   PET IMPRESSION: 1. Hypermetabolic distal esophageal primary. 2. Low-level hypermetabolism within upper abdominal nodes. These are technically nonspecific, but favored to be reactive in the setting of hepatic steatosis and hepatomegaly. 3. Otherwise, no evidence of metastatic disease. 4. Anal hypermetabolism could be physiologic. Consider correlation with physical exam and colonoscopy, if patient is not up-to-date. 5. Incidental findings, including: Aortic atherosclerosis (ICD10-I70.0), coronary artery atherosclerosis and emphysema (ICD10-J43.9). Sinus disease and bilateral mastoid effusions.   09/22/2019 Initial Diagnosis   Esophageal cancer ([place])   09/30/2019 -  Chemotherapy   concurrent ChemoRT with weekly Carboplatin and Taxol starting 4/12   09/30/2019 -  Radiation Therapy   concurrent ChemoRT with Dr. Lisbeth Renshaw starting 4/12  CURRENT THERAPY:  PENDING  concurrent chemoRT with weekly CT for 6 weeks starting 4/12  INTERVAL HISTORY:  James Dunn is here for a follow up. He presents to the clinic with his wife. He has questions about treatment and how long they will last. He notes he wants treatments in the afternoon so he can go golfing in the morning. He notes he is starting to have a hard time swallowing Metformin but apprehensive about starting insulin while he cannot swallow it. He note she can eat cereal and bread. He notes he has ensure but has not been using much. He notes mild cough with phlegm intermittently. He relates this to allergies. He notes he has both his COVID19 vaccines.  He notes he has a [place] trip on weekend of 4/25. He notes he cut out alcohol. He has already quit smoking. He had smokes Marijuana in the past. He denies use of other recreational drugs. His wife works on Mondays at [employer].     REVIEW OF SYSTEMS:   Constitutional: Denies fevers, chills or abnormal weight loss Eyes: Denies blurriness of vision Ears, nose, mouth, throat, and face: Denies mucositis or sore throat Respiratory: Denies cough, dyspnea or wheezes Cardiovascular: Denies palpitation, chest discomfort or lower extremity swelling Gastrointestinal:  Denies nausea or change in bowel habits (+) Acid Reflux  Skin: Denies abnormal skin rashes Lymphatics: Denies new lymphadenopathy or easy bruising Neurological:Denies numbness, tingling or new weaknesses Behavioral/Psych: Mood is stable, no new changes  All other systems were reviewed with the patient and are negative.  MEDICAL HISTORY:  Past Medical History:  Diagnosis Date  . Allergy   . Asthma   . Diabetes mellitus without complication ([place])   . Diverticulosis   . GERD (gastroesophageal reflux disease) 2002  . Health maintenance examination     SURGICAL HISTORY: Past Surgical History:  Procedure Laterality Date  . BIOPSY  08/30/2019   Procedure: BIOPSY;  Surgeon: [name], MD;  Location: [facility];  Service: Endoscopy;;  . ESOPHAGOGASTRODUODENOSCOPY (EGD) WITH PROPOFOL N/A 08/30/2019   Procedure: ESOPHAGOGASTRODUODENOSCOPY (EGD) WITH PROPOFOL;  Surgeon: [name], MD;  Location: [facility];  Service: Endoscopy;  Laterality: N/A;  . ESOPHAGOGASTRODUODENOSCOPY (EGD) WITH PROPOFOL N/A 09/13/2019   Procedure: ESOPHAGOGASTRODUODENOSCOPY (EGD) WITH PROPOFOL;  Surgeon: [name], MD;  Location: [facility];  Service: Endoscopy;  Laterality: N/A;  . SKIN BIOPSY N/A 03/04/2019   upper back Nerofibroma and follicular cyst   . SUBMUCOSAL INJECTION  08/30/2019   Procedure: SUBMUCOSAL INJECTION;  Surgeon: [name], MD;  Location: [facility];  Service: Endoscopy;;  . UPPER ESOPHAGEAL ENDOSCOPIC ULTRASOUND (EUS) N/A 09/13/2019   Procedure: UPPER ESOPHAGEAL ENDOSCOPIC ULTRASOUND (EUS);  Surgeon: [name], MD;  Location: [facility];  Service: Endoscopy;  Laterality: N/A;    I have reviewed the social history and family history with the patient and they are unchanged from previous note.  ALLERGIES:  has No Known Allergies.  MEDICATIONS:  Current Outpatient Medications  Medication Sig Dispense Refill  . albuterol (VENTOLIN HFA) 108 (90 Base) MCG/ACT inhaler TAKE 2 PUFFS BY MOUTH EVERY 6 HOURS AS NEEDED FOR WHEEZE (Patient taking differently: Inhale 2 puffs into the lungs every 6 (six) hours as needed (wheezing/shortness of breath.). ) 18 g 0  . atorvastatin (LIPITOR) 10 MG tablet Take 10 mg by mouth daily.    Marland Kitchen lisinopril (ZESTRIL) 5 MG tablet Take 5 mg by mouth daily.    . metFORMIN (GLUCOPHAGE) 1000 MG tablet TAKE 1 TABLET (1,000  MG TOTAL) BY MOUTH 2 (TWO) TIMES DAILY WITH A MEAL. 180 tablet 1  . prochlorperazine (COMPAZINE) 10 MG tablet Take 1 tablet (10 mg total) by mouth every 6 (six) hours as needed (Nausea or vomiting). 30 tablet 1   No current facility-administered medications for this visit.    PHYSICAL EXAMINATION: ECOG PERFORMANCE  STATUS: 1 - Symptomatic but completely ambulatory  Vitals:   09/23/19 1429  BP: 131/73  Pulse: 63  Resp: 20  Temp: 99.6 F (37.6 C)  SpO2: 100%   Filed Weights   09/23/19 1429  Weight: 177 lb 8 oz (80.5 kg)   Due to COVID19 we will limit examination to appearance. Patient had no complaints.  GENERAL:alert, no distress and comfortable SKIN: skin color normal, no rashes or significant lesions EYES: normal, Conjunctiva are pink and non-injected, sclera clear  NEURO: alert & oriented x 3 with fluent speech   LABORATORY DATA:  I have reviewed the data as listed CBC Latest Ref Rng & Units 09/11/2019 11/07/2018 08/02/2017  WBC 4.0 - 10.5 K/uL 7.7 8.5 8.3  Hemoglobin 13.0 - 17.0 g/dL 12.6(L) 15.8 14.5  Hematocrit 39.0 - 52.0 % 37.5(L) 44.7 43.7  Platelets 150 - 400 K/uL 307 256 264     CMP Latest Ref Rng & Units 09/11/2019 11/07/2018 08/02/2017  Glucose 70 - 99 mg/dL 223(H) 121(H) 130(H)  BUN 8 - 23 mg/dL 11 10 10   Creatinine [id number] - 1.24 mg/dL 1.24 1.15 1.11  Sodium 135 - 145 mmol/L 137 138 138  Potassium 3.5 - 5.1 mmol/L 5.0 4.6 5.1  Chloride 98 - 111 mmol/L 104 96 97  CO2 22 - 32 mmol/L 25 25 24   Calcium 8.9 - 10.3 mg/dL 9.3 9.9 9.5  Total Protein 6.5 - 8.1 g/dL 7.1 7.3 7.3  Total Bilirubin 0.3 - 1.2 mg/dL 0.4 0.6 0.5  Alkaline Phos 38 - 126 U/L 56 54 52  AST 15 - 41 U/L 16 20 13   ALT 0 - 44 U/L 27 27 20       RADIOGRAPHIC STUDIES: I have personally reviewed the radiological images as listed and agreed with the findings in the report. No results found.   ASSESSMENT & PLAN:  James Dunn is a 69 y.o. male with    1. Adenocarcinoma of distal esophagus, T3N2M0, stage IVA -His 08/30/19 EGD showed a large, fungating, partially circumferential mass in the lower third of the esophagus causing dysphagia.  -We discussed his EUS from 09/13/19 which showed locally advanced adenocarcinoma invading the muscular layer with 2 enlarged LNs, T3N2M0. -I personally reviewed and discussed  his PET from 09/20/19 with pt and his wife which showed known esophageal mass, Low-level hypermetabolism within upper abdominal nodes, probably reactive. Otherwise no evidence of metastatic disease.  -I reviewed the aggressive nature of esophageal cancer, and the high risk of recurrence after surgery.` We reviewed he has locally advanced disease, no definitive evidence of distant metastasis. -I discussed standard treatment of locally advanced esophageal cancer includes neoadjuvant concurrent ChemoRT with weekly Carboplatin and Taxol for 6 weeks before surgery. I discussed surgery is curative and neoadjuvant treatment are to downstage his cancer before surgery and reduce risk of recurrence.   --Chemotherapy consent: Side effects including but does not limited to, fatigue, nausea, vomiting, diarrhea, hair loss, neuropathy, fluid retention, renal and kidney dysfunction, neutropenic fever, needed for blood transfusion, bleeding, were discussed with patient in great detail. He agrees to proceed. -The goal of therapy is curative. -He plans to consult with Dr. Lisbeth Renshaw  and CT Simulation tomorrow.  -I will refer him to Dr. Servando Snare to consult about the surgery.  -We discussed esophagectomy, and the possible feeding tube after surgery.  Patient is reluctant to consider the surgery, due to the concern of his quality of life and high risk of recurrence. `I discussed even with chemoRT and Surgery his risk of recurrence is still about 50%, however surgery is the only way to cure his cancer. If he did not want to proceed with surgery I would add consolidation chemotherapy after chemo RT. I discussed treatment at that point would be palliative to control his disease and prolong his life. Given his health and age I suspect he is a candidate for surgery and should manage it well.  -Before the start of his treatment he will proceed with Chemo education class.  -f/u with start of treatment then weekly.    2. Dysphagia with  solid food -started early 2021, secondary to #1 -he tolerates normal diet for the most part. Some meat and large pills get stuck such as his metformin -no significant weight loss, I do not feel he needs feeding tube at this time, but I discussed he may need tube feeding with surgery. Will monitor closely. I encouraged him to chew well and drink plenty of fluid.  -I will refer him to dietician  -He has Ensure but not using much. I also recommend he use Glucerna given he is diabetic. -I will call in sublingual Zofran for antiemetics during chemo.    3. H/o Tobacco and alcohol use -He has long term tobacco abuse, quit in 03/2019. Today he notes he has cut out alcohol recently (09/23/19). I recommend he continue cessation.   4. HTN, HL, DM, GERD  -Managed by PCP, on lisinopril, atorvastatin, and metformin -His DM is not well controlled. He notes he may not continue to swallow his metformin soon. I discussed he may need to switch to insulin to control his DM. He is apprehensive about this.  -I encouraged him to check his BG at home more often and little to no sugar intake a few days after infusion.  -He does have acid reflux which has been flaring lately. He is on antiacid, I recommend he continue especially on chemo.    PLAN: -rad/onc consult and CT Simulation tomorrow -Send referral to Dr. Servando Snare -Send referral to [facility] education this week -Lab and chemo weekly X6 starting 4/12   No problem-specific Assessment & Plan notes found for this encounter.   No orders of the defined types were placed in this encounter.  All questions were answered. The patient knows to call the clinic with any problems, questions or concerns. No barriers to learning was detected. The total time spent in the appointment was 40 minutes.     [name], MD 09/23/2019   I, Joslyn Devon, am acting as scribe for [name], MD.   I have reviewed the above documentation for accuracy and  completeness, and I agree with the above.

## 2019-09-20 ENCOUNTER — Other Ambulatory Visit: Payer: Self-pay

## 2019-09-20 ENCOUNTER — Ambulatory Visit (HOSPITAL_COMMUNITY)
Admission: RE | Admit: 2019-09-20 | Discharge: 2019-09-20 | Disposition: A | Discharge status: Home / Self Care | Payer: PPO | Source: Ambulatory Visit | Attending: Nurse Practitioner | Admitting: Nurse Practitioner

## 2019-09-20 DIAGNOSIS — I700 Atherosclerosis of aorta: Secondary | ICD-10-CM | POA: Insufficient documentation

## 2019-09-20 DIAGNOSIS — M2548 Effusion, other site: Secondary | ICD-10-CM | POA: Insufficient documentation

## 2019-09-20 DIAGNOSIS — K760 Fatty (change of) liver, not elsewhere classified: Secondary | ICD-10-CM | POA: Diagnosis not present

## 2019-09-20 DIAGNOSIS — J439 Emphysema, unspecified: Secondary | ICD-10-CM | POA: Diagnosis not present

## 2019-09-20 DIAGNOSIS — C155 Malignant neoplasm of lower third of esophagus: Secondary | ICD-10-CM | POA: Diagnosis not present

## 2019-09-20 DIAGNOSIS — I2510 Atherosclerotic heart disease of native coronary artery without angina pectoris: Secondary | ICD-10-CM | POA: Insufficient documentation

## 2019-09-20 LAB — GLUCOSE, CAPILLARY: Glucose-Capillary: 134 mg/dL — ABNORMAL HIGH (ref 70–99)

## 2019-09-20 MED ORDER — FLUDEOXYGLUCOSE F - 18 (FDG) INJECTION
8.8000 | Freq: Once | INTRAVENOUS | Status: AC | PRN
  Administered 2019-09-20: 8.8 via INTRAVENOUS

## 2019-09-20 NOTE — Progress Notes (Signed)
GI Location of Tumor / Histology: Distal Esophagus  James Dunn presented with globus and solid dysphagia in 04/2019 without relief from PPI.  Barium study on 08/22/2019 showed long segment malignant appearing stricture in the lower third of the thoracic esophagus extending to the esophagogastric junction.   PET [id number]: Hypermetabolic distal esophageal primary.  Low level hypermetabolism within upper abdominal nodes.  These are technically nonspecific, but favored to be reactive in the setting of hepatic steatosis and hepatomegaly.  Otherwise, no evidence of metastatic disease.  Anal hypermetabolism could be physiologic.  Consider correlation with physical exam and colonoscopy, if patient is not up to date.  EUS 09/13/2019: Mass found in the lower third of the esophagus.  The lesion was circumferential.  The endosonographic borders were irregular.  The mass measured up to 26 mm in thickness.  There was sonographic evidence suggesting invasion into the adventitia.  A tissue diagnosis was obtained prior to this exam.  This is of adenocarcinoma.  CT CAP 09/05/2019: Soft tissue mass in the distal esophagus compatible with known malignancy with a 6 mm adjacent paraesophageal node, concerning for metastasis, and scattered nodes in the hepatoduodenal ligament and para-aortic regions and a peri-fissural node in the right middle lobe.  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  The ascending thoracic aorta measures 4 cm.  He was referred to oncology for further work up and treatment.  EUS could not be done due to copious amount of bleeding.  Will be rescheduled  Upper endoscopy 08/30/2019: large, extremely friable, malignant appearing two thirds circumferential mass noted in the distal esophagus.  Colonoscopy 11/2016: sigmoid diverticulosis, internal hemorrhoids, hyperplastic polyp in the sigmoid colon and tubular  adenoma in the transverse colon.  Biopsies of Esophagus 08/30/2019   Past/Anticipated interventions by surgeon, if any:    Past/Anticipated interventions by medical oncology, if any:  Follow- up 09/23/2019 - Chemo start date 09/30/2019  Dr. [name] NP Kalman Shan 09/11/2019 -We are referring him for staging PET scan for further work up.  -We talked briefly about treatment for esophagus cancers in general including chemo, radiotherapy, and surgery if this is localized. He understands surgery is likely the only way to potentially cure his cancer, and if not a surgical candidate then his treatment will be palliative. However, there is small change of complete response with chemoRT alone.  -We will see him back after EUS and PET to review results, finalize staging and the care plan. We will refer him to radiation oncologist Dr. Lisbeth Renshaw and Dr. Servando Snare for surgical opinion as appropriate.    Weight changes, if any: Minimal weight loss  Bowel/Bladder complaints, if any: No  Nausea / Vomiting, if any: No  Pain issues, if any:  No  SAFETY ISSUES:  Prior radiation? No  Pacemaker/ICD? No  Possible current pregnancy? n/a  Is the patient on methotrexate? No  Current Complaints/Details: -Able to eat mostly normal diet, some meats are off limits -Takes metformin

## 2019-09-22 DIAGNOSIS — C159 Malignant neoplasm of esophagus, unspecified: Secondary | ICD-10-CM | POA: Insufficient documentation

## 2019-09-22 DIAGNOSIS — Z8501 Personal history of malignant neoplasm of esophagus: Secondary | ICD-10-CM | POA: Insufficient documentation

## 2019-09-22 HISTORY — DX: Malignant neoplasm of esophagus, unspecified: C15.9

## 2019-09-23 ENCOUNTER — Encounter: Payer: Self-pay | Admitting: Hematology

## 2019-09-23 ENCOUNTER — Inpatient Hospital Stay: Payer: PPO | Attending: Nurse Practitioner | Admitting: Hematology

## 2019-09-23 ENCOUNTER — Other Ambulatory Visit: Payer: Self-pay

## 2019-09-23 VITALS — BP 131/73 | HR 63 | Temp 99.6°F | Resp 20 | Ht 67.0 in | Wt 177.5 lb

## 2019-09-23 DIAGNOSIS — J4520 Mild intermittent asthma, uncomplicated: Secondary | ICD-10-CM | POA: Diagnosis not present

## 2019-09-23 DIAGNOSIS — C155 Malignant neoplasm of lower third of esophagus: Secondary | ICD-10-CM

## 2019-09-23 DIAGNOSIS — Z87891 Personal history of nicotine dependence: Secondary | ICD-10-CM | POA: Insufficient documentation

## 2019-09-23 DIAGNOSIS — E119 Type 2 diabetes mellitus without complications: Secondary | ICD-10-CM | POA: Insufficient documentation

## 2019-09-23 DIAGNOSIS — I10 Essential (primary) hypertension: Secondary | ICD-10-CM | POA: Diagnosis not present

## 2019-09-23 DIAGNOSIS — R1310 Dysphagia, unspecified: Secondary | ICD-10-CM | POA: Insufficient documentation

## 2019-09-23 DIAGNOSIS — Z5111 Encounter for antineoplastic chemotherapy: Secondary | ICD-10-CM | POA: Insufficient documentation

## 2019-09-23 DIAGNOSIS — Z7984 Long term (current) use of oral hypoglycemic drugs: Secondary | ICD-10-CM | POA: Insufficient documentation

## 2019-09-23 DIAGNOSIS — K219 Gastro-esophageal reflux disease without esophagitis: Secondary | ICD-10-CM | POA: Diagnosis not present

## 2019-09-23 DIAGNOSIS — Z7289 Other problems related to lifestyle: Secondary | ICD-10-CM | POA: Insufficient documentation

## 2019-09-23 MED ORDER — PROCHLORPERAZINE MALEATE 10 MG PO TABS: 10.0000 mg | ORAL_TABLET | Freq: Four times a day (QID) | ORAL | 1 refills | Status: DC | PRN

## 2019-09-23 NOTE — Progress Notes (Signed)
START ON PATHWAY REGIMEN - Gastroesophageal     Administer weekly during RT:     Paclitaxel      Carboplatin   **Always confirm dose/schedule in your pharmacy ordering system**  Patient Characteristics: Esophageal & GE Junction, Adenocarcinoma, Preoperative or Nonsurgical Candidate (Clinical Staging), cT2 or Higher or cN+, Surgical Candidate (Up to cT4a) - Preoperative Therapy, Esophageal Histology: Adenocarcinoma Disease Classification: Esophageal Therapeutic Status: Preoperative or Nonsurgical Candidate (Clinical Staging) AJCC Grade: GX AJCC 8 Stage Grouping: IVA AJCC T Category: cT3 AJCC N Category: cN2 AJCC M Category: cM0 Intent of Therapy: Curative Intent, Discussed with Patient

## 2019-09-24 ENCOUNTER — Telehealth: Payer: Self-pay | Admitting: Internal Medicine

## 2019-09-24 ENCOUNTER — Ambulatory Visit
Admission: RE | Admit: 2019-09-24 | Discharge: 2019-09-24 | Disposition: A | Discharge status: Home / Self Care | Payer: PPO | Source: Ambulatory Visit | Attending: Radiation Oncology | Admitting: Radiation Oncology

## 2019-09-24 ENCOUNTER — Encounter: Payer: Self-pay | Admitting: Radiation Oncology

## 2019-09-24 ENCOUNTER — Other Ambulatory Visit: Payer: Self-pay

## 2019-09-24 VITALS — Ht 67.0 in | Wt 177.0 lb

## 2019-09-24 DIAGNOSIS — C155 Malignant neoplasm of lower third of esophagus: Secondary | ICD-10-CM

## 2019-09-24 DIAGNOSIS — Z510 Encounter for antineoplastic radiation therapy: Secondary | ICD-10-CM | POA: Diagnosis not present

## 2019-09-24 DIAGNOSIS — F17211 Nicotine dependence, cigarettes, in remission: Secondary | ICD-10-CM | POA: Diagnosis not present

## 2019-09-24 NOTE — Telephone Encounter (Signed)
Pt states that the metformin is too large for him to swallow as he has a tumor on his esophagus. He says he is only taking 1,000mg  once a day instead of twice a day as his BS has been good. He is wanting to switch to 500mg  twice a day. Please advise

## 2019-09-24 NOTE — Progress Notes (Addendum)
Radiation Oncology         [phone] ________________________________  Name: James Dunn        MRN: [id number]  Date of Service: 09/24/2019 DOB: 1951-04-21  [id number], [name], MD  [name], MD     REFERRING PHYSICIAN: [name], MD   DIAGNOSIS: The encounter diagnosis was Malignant neoplasm of lower third of esophagus ([place]).   HISTORY OF PRESENT ILLNESS: James Dunn is a 69 y.o. male seen at the request of Dr. [name] for a newly diagnosed cancer of the distal esophagus. The patient presented to Dr. [name] for symptoms of progressive dysphagia that started in November 2020 and worsened in February 2021. He was seen by Dr. [name] and on 08/30/2019 underwent endoscopy which revealed a large fungating mass in the distal esophagus and two thirds of the esophagus were involved, the length was approximately 7 cm and the lesion was friable.  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  CT imaging on 09/05/2019 revealed soft tissue mass in the distal esophagus compatible with his known malignancy, upper to mildly enlarged hepatoduodenal ligament node and upper limit of normal right periaortic nodes were seen.  There was soft tissue posterior to the descending duodenum in close proximity to the pancreatic head, a 6 mm perifissural nodule in the right middle lobe, hepatomegaly with steatosis, and stigmata of ectatic thoracic aorta.  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  He did undergo an endoscopic ultrasound on 09/13/2019 revealing concerns for T3N3 disease.  He has been seen by Dr. [name], and pet imaging on 09/20/2019 revealed hypermetabolism in the distal esophagus, low-level hypermetabolic change in the upper abdominal nodes technically nonspecific but favored to be reactive as result of his hepatic steatosis and hepatomegaly, no additional evidence of metastatic disease was noted, he did have some anal hypermetabolism felt to be physiologic and he had a colonoscopy that showed  diverticulosis in 2018.  He is in the process of getting set up also to see cardiothoracic surgery, and is seen today to discuss the rationale for chemoradiation.   PREVIOUS RADIATION THERAPY: No   PAST MEDICAL HISTORY:  Past Medical History:  Diagnosis Date  . Allergy   . Asthma   . Diabetes mellitus without complication ([place])   . Diverticulosis   . GERD (gastroesophageal reflux disease) 2002  . Health maintenance examination        PAST SURGICAL HISTORY: Past Surgical History:  Procedure Laterality Date  . BIOPSY  08/30/2019   Procedure: BIOPSY;  Surgeon: [name], MD;  Location: [facility];  Service: Endoscopy;;  . ESOPHAGOGASTRODUODENOSCOPY (EGD) WITH PROPOFOL N/A 08/30/2019   Procedure: ESOPHAGOGASTRODUODENOSCOPY (EGD) WITH PROPOFOL;  Surgeon: [name], MD;  Location: [facility];  Service: Endoscopy;  Laterality: N/A;  . ESOPHAGOGASTRODUODENOSCOPY (EGD) WITH PROPOFOL N/A 09/13/2019   Procedure: ESOPHAGOGASTRODUODENOSCOPY (EGD) WITH PROPOFOL;  Surgeon: [name], MD;  Location: [facility];  Service: Endoscopy;  Laterality: N/A;  . SKIN BIOPSY N/A 03/04/2019   upper back Nerofibroma and follicular cyst   . SUBMUCOSAL INJECTION  08/30/2019   Procedure: SUBMUCOSAL INJECTION;  Surgeon: [name], MD;  Location: [facility];  Service: Endoscopy;;  . UPPER ESOPHAGEAL ENDOSCOPIC ULTRASOUND (EUS) N/A 09/13/2019   Procedure: UPPER ESOPHAGEAL ENDOSCOPIC ULTRASOUND (EUS);  Surgeon: [name], MD;  Location: [facility];  Service: Endoscopy;  Laterality: N/A;     FAMILY HISTORY: No family history on file.   SOCIAL HISTORY:  reports that he quit smoking about 6 months  ago. His smoking use included cigarettes. He has never used smokeless tobacco. He reports current alcohol use of about 7.0 standard drinks of alcohol per week. He reports that he does not use drugs.  The patient is retired and lives in [place].  He enjoys golfing 3 days a week.   ALLERGIES:  Patient has no known allergies.   MEDICATIONS:  Current Outpatient Medications  Medication Sig Dispense Refill  . albuterol (VENTOLIN HFA) 108 (90 Base) MCG/ACT inhaler TAKE 2 PUFFS BY MOUTH EVERY 6 HOURS AS NEEDED FOR WHEEZE (Patient taking differently: Inhale 2 puffs into the lungs every 6 (six) hours as needed (wheezing/shortness of breath.). ) 18 g 0  . atorvastatin (LIPITOR) 10 MG tablet Take 10 mg by mouth daily.    Marland Kitchen lisinopril (ZESTRIL) 5 MG tablet Take 5 mg by mouth daily.    . metFORMIN (GLUCOPHAGE) 1000 MG tablet TAKE 1 TABLET (1,000 MG TOTAL) BY MOUTH 2 (TWO) TIMES DAILY WITH A MEAL. 180 tablet 1  . prochlorperazine (COMPAZINE) 10 MG tablet Take 1 tablet (10 mg total) by mouth every 6 (six) hours as needed (Nausea or vomiting). 30 tablet 1   No current facility-administered medications for this encounter.     REVIEW OF SYSTEMS: On review of systems, the patient reports that he is doing well overall.  He is able to currently eat most things with the exception of meat like steak or pork.  He is able to drink regularly, but is limited with regurgitation episodes if he eats something that is too dense.  Denies any chest pain, shortness of breath, cough, fevers, chills, night sweats, unintended weight changes. He denies any bowel or bladder disturbances, and denies abdominal pain, nausea or vomiting. He denies any new musculoskeletal or joint aches or pains. A complete review of systems is obtained and is otherwise negative.     PHYSICAL EXAM:  Unable to obtain vitals due to encounter type In general this is a well appearing Caucasian male in no acute distress.  He's alert and oriented x4 and appropriate throughout the examination. Cardiopulmonary assessment is negative for acute distress and he exhibits normal effort.    ECOG = 1  0 - Asymptomatic (Fully active, able to carry on all predisease activities without restriction)  1 - Symptomatic but completely ambulatory (Restricted  in physically strenuous activity but ambulatory and able to carry out work of a light or sedentary nature. For example, light housework, office work)  2 - Symptomatic, <50% in bed during the day (Ambulatory and capable of all self care but unable to carry out any work activities. Up and about more than 50% of waking hours)  3 - Symptomatic, >50% in bed, but not bedbound (Capable of only limited self-care, confined to bed or chair 50% or more of waking hours)  4 - Bedbound (Completely disabled. Cannot carry on any self-care. Totally confined to bed or chair)  5 - Death   Eustace Pen MM, Creech RH, Tormey DC, et al. [phone]). "Toxicity and response criteria of the [facility]". [place] Oncol. 5 (6): 649-55    LABORATORY DATA:  Lab Results  Component Value Date   WBC 7.7 09/11/2019   HGB 12.6 (L) 09/11/2019   HCT 37.5 (L) 09/11/2019   MCV 92.1 09/11/2019   PLT 307 09/11/2019   Lab Results  Component Value Date   NA 137 09/11/2019   K 5.0 09/11/2019   CL 104 09/11/2019   CO2 25 09/11/2019  Lab Results  Component Value Date   ALT 27 09/11/2019   AST 16 09/11/2019   ALKPHOS 56 09/11/2019   BILITOT 0.4 09/11/2019      RADIOGRAPHY: CT CHEST W CONTRAST  Result Date: 09/05/2019 CLINICAL DATA:  New diagnosis of esophageal cancer. EXAM: CT CHEST, ABDOMEN, AND PELVIS WITH CONTRAST TECHNIQUE: Multidetector CT imaging of the chest, abdomen and pelvis was performed following the standard protocol during bolus administration of intravenous contrast. CONTRAST:  163mL ISOVUE-300 IOPAMIDOL (ISOVUE-300) INJECTION 61% COMPARISON:  [facility] esophagram 08/22/2019. FINDINGS: CT CHEST FINDINGS Cardiovascular: The heart size is normal. No substantial pericardial effusion. Coronary artery calcification is evident. Atherosclerotic calcification is noted in the wall of the thoracic aorta. Ascending thoracic aorta is 4 cm diameter. Mediastinum/Nodes: No mediastinal lymphadenopathy. 6 mm  short axis lower paraesophageal node is visible on image 48/series 2. Soft tissue mass noted distal esophagus, compatible with known neoplasm. No axillary lymphadenopathy. There is no hilar lymphadenopathy. Lungs/Pleura: Centrilobular and paraseptal emphysema evident. 6 mm perifissural nodule noted right middle lobe on image 100/series 4, likely a subpleural lymph node. Tiny calcified granuloma noted left lower lobe on 140/4. A cluster of granulomata in scarring in the lateral left lower lobe is compatible with prior granulomatous involvement. Musculoskeletal: No worrisome lytic or sclerotic osseous abnormality. CT ABDOMEN PELVIS FINDINGS Hepatobiliary: The liver shows diffusely decreased attenuation suggesting fat deposition. Liver measures 18.2 cm craniocaudal length, enlarged. No suspicious focal lesion within the hepatic parenchyma. Gallbladder is nondistended. No intrahepatic or extrahepatic biliary dilation. Pancreas: No focal mass lesion. No dilatation of the main duct. No intraparenchymal cyst. No peripancreatic edema. 1.9 x 1.3 cm nodular collection of soft tissues identified posterior to the descending duodenum (see image 75/series 2. This is in close proximity to the pancreatic head but a definite communication of parenchyma between these 2 structures is not discernible by CT. 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 Spleen: No splenomegaly. No focal mass lesion. Adrenals/Urinary Tract: No adrenal nodule or mass. 7 mm hypoattenuating lesion interpolar right kidney is too small to characterize but is most likely benign. Similar 4 mm cortical hypointensity in the lower pole left kidney cannot be characterized but is likely benign. No evidence for hydroureter. The urinary bladder appears normal for the degree of distention. Stomach/Bowel: Stomach is unremarkable. No gastric wall thickening. No  evidence of outlet obstruction. Duodenum is normally positioned as is the ligament of Treitz. No small bowel wall thickening. No small bowel dilatation. The terminal ileum is normal. The appendix is normal. Diverticular changes are noted in the left colon without evidence of diverticulitis. Vascular/Lymphatic: There is abdominal aortic atherosclerosis without aneurysm. Left-sided IVC evident, normal variant. 15 mm short axis hepatoduodenal ligament lymph node is mildly enlarged (image 70/2. No other mediastinal lymphadenopathy although upper normal right para-aortic nodes are visible on images 73 and 75 of series 2, measuring up to 12 mm short axis. No pelvic sidewall lymphadenopathy. Reproductive: The prostate gland and seminal vesicles are unremarkable. Other: No intraperitoneal free fluid. Musculoskeletal: Fatty lesion in the inferior aspect of the iliopsoas complex is compatible with lipoma. No worrisome lytic or sclerotic osseous abnormality. IMPRESSION: 1. Soft tissue mass noted distal esophagus, compatible with known neoplasm. There is a small 6 mm short axis adjacent paraesophageal node, concerning for metastatic disease. 2. Upper normal to mildly enlarged hepatoduodenal ligament lymph node, with upper normal right para-aortic nodes. As metastatic disease cannot be  excluded, PET-CT may prove helpful to further evaluate. 3. 1.9 x 1.3 cm nodular collection of soft tissue posterior to the descending duodenum. This is in close proximity to the pancreatic head but a definite communication of parenchyma between these 2 structures is not discernible by CT. 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 Attention on follow-up recommended. 4. 6 mm perifissural nodule right middle lobe, likely a subpleural lymph node. Attention on follow-up recommended. 5. Hepatomegaly with hepatic steatosis. 6. Ascending thoracic aorta measures 4 cm diameter. Recommend annual imaging followup by CTA or MRA.  This recommendation follows 2010 ACCF/AHA/AATS/ACR/ASA/SCA/SCAI/SIR/STS/SVM Guidelines for the Diagnosis and Management of Patients with Thoracic Aortic Disease. Circulation. 2010; [id number]. Aortic aneurysm NOS (ICD10-I71.9) 7. Left-sided IVC, normal variant. Electronically Signed   By: Misty Stanley M.D.   On: 09/05/2019 10:38   CT ABDOMEN PELVIS W CONTRAST  Result Date: 09/05/2019 CLINICAL DATA:  New diagnosis of esophageal cancer. EXAM: CT CHEST, ABDOMEN, AND PELVIS WITH CONTRAST TECHNIQUE: Multidetector CT imaging of the chest, abdomen and pelvis was performed following the standard protocol during bolus administration of intravenous contrast. CONTRAST:  160mL ISOVUE-300 IOPAMIDOL (ISOVUE-300) INJECTION 61% COMPARISON:  [facility] esophagram 08/22/2019. FINDINGS: CT CHEST FINDINGS Cardiovascular: The heart size is normal. No substantial pericardial effusion. Coronary artery calcification is evident. Atherosclerotic calcification is noted in the wall of the thoracic aorta. Ascending thoracic aorta is 4 cm diameter. Mediastinum/Nodes: No mediastinal lymphadenopathy. 6 mm short axis lower paraesophageal node is visible on image 48/series 2. Soft tissue mass noted distal esophagus, compatible with known neoplasm. No axillary lymphadenopathy. There is no hilar lymphadenopathy. Lungs/Pleura: Centrilobular and paraseptal emphysema evident. 6 mm perifissural nodule noted right middle lobe on image 100/series 4, likely a subpleural lymph node. Tiny calcified granuloma noted left lower lobe on 140/4. A cluster of granulomata in scarring in the lateral left lower lobe is compatible with prior granulomatous involvement. Musculoskeletal: No worrisome lytic or sclerotic osseous abnormality. CT ABDOMEN PELVIS FINDINGS Hepatobiliary: The liver shows diffusely decreased attenuation suggesting fat deposition. Liver measures 18.2 cm craniocaudal length, enlarged. No suspicious focal lesion within the hepatic parenchyma.  Gallbladder is nondistended. No intrahepatic or extrahepatic biliary dilation. Pancreas: No focal mass lesion. No dilatation of the main duct. No intraparenchymal cyst. No peripancreatic edema. 1.9 x 1.3 cm nodular collection of soft tissues identified posterior to the descending duodenum (see image 75/series 2. This is in close proximity to the pancreatic head but a definite communication of parenchyma between these 2 structures is not discernible by CT. 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 Spleen: No splenomegaly. No focal mass lesion. Adrenals/Urinary Tract: No adrenal nodule or mass. 7 mm hypoattenuating lesion interpolar right kidney is too small to characterize but is most likely benign. Similar 4 mm cortical hypointensity in the lower pole left kidney cannot be characterized but is likely benign. No evidence for hydroureter. The urinary bladder appears normal for the degree of distention. Stomach/Bowel: Stomach is unremarkable. No gastric wall thickening. No evidence of outlet obstruction. Duodenum is normally positioned as is the ligament of Treitz. No small bowel wall thickening. No small bowel dilatation. The terminal ileum is normal. The appendix is normal. Diverticular changes are noted in the left colon without evidence of diverticulitis. Vascular/Lymphatic: There is abdominal aortic atherosclerosis without aneurysm. Left-sided IVC evident, normal variant. 15 mm short axis hepatoduodenal ligament lymph node is mildly enlarged (image 70/2. No  other mediastinal lymphadenopathy although upper normal right para-aortic nodes are visible on images 73 and 75 of series 2, measuring up to 12 mm short axis. No pelvic sidewall lymphadenopathy. Reproductive: The prostate gland and seminal vesicles are unremarkable. Other: No intraperitoneal free fluid. Musculoskeletal: Fatty lesion in the inferior aspect  of the iliopsoas complex is compatible with lipoma. No worrisome lytic or sclerotic osseous abnormality. IMPRESSION: 1. Soft tissue mass noted distal esophagus, compatible with known neoplasm. There is a small 6 mm short axis adjacent paraesophageal node, concerning for metastatic disease. 2. Upper normal to mildly enlarged hepatoduodenal ligament lymph node, with upper normal right para-aortic nodes. As metastatic disease cannot be excluded, PET-CT may prove helpful to further evaluate. 3. 1.9 x 1.3 cm nodular collection of soft tissue posterior to the descending duodenum. This is in close proximity to the pancreatic head but a definite communication of parenchyma between these 2 structures is not discernible by CT. 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 Attention on follow-up recommended. 4. 6 mm perifissural nodule right middle lobe, likely a subpleural lymph node. Attention on follow-up recommended. 5. Hepatomegaly with hepatic steatosis. 6. Ascending thoracic aorta measures 4 cm diameter. Recommend annual imaging followup by CTA or MRA. This recommendation follows 2010 ACCF/AHA/AATS/ACR/ASA/SCA/SCAI/SIR/STS/SVM Guidelines for the Diagnosis and Management of Patients with Thoracic Aortic Disease. Circulation. 2010; [id number]. Aortic aneurysm NOS (ICD10-I71.9) 7. Left-sided IVC, normal variant. Electronically Signed   By: Misty Stanley M.D.   On: 09/05/2019 10:38   NM PET Image Initial (PI) Skull Base To Thigh  Result Date: 09/20/2019 CLINICAL DATA:  Initial treatment strategy for malignant neoplasm of distal third of esophagus. COVID vaccine in right arm on 08/19/2019 EXAM: NUCLEAR MEDICINE PET SKULL BASE TO THIGH TECHNIQUE: 8.8 mCi F-18 FDG was injected intravenously. Full-ring PET imaging was performed from the skull base to thigh after the radiotracer. CT data was obtained and used for attenuation correction and anatomic localization. Fasting blood glucose: 134 mg/dl  COMPARISON:  Esophagram of 08/22/2019. Chest abdomen and pelvic CTs of 09/05/2019. FINDINGS: Mediastinal blood pool activity: SUV max 2.8 Liver activity: SUV max NA NECK: No areas of abnormal hypermetabolism. Incidental CT findings: Bilateral carotid atherosclerosis. No cervical adenopathy. Mucosal thickening of both maxillary sinuses. Fluid in both mastoid air cells. CHEST: Low-level hypermetabolism within right axillary nodes, including index node of 1.2 cm and a S.U.V. max of 3.0, likely reactive and related to recent COVID-19 vaccine. The distal esophageal primary is markedly hypermetabolic. Example at a S.U.V. max of 25.8, including on 103/4. The adjacent periesophageal node described on CT is not hypermetabolic. No pulmonary parenchymal hypermetabolism. Incidental CT findings: Centrilobular and paraseptal emphysema. Aortic and coronary artery atherosclerosis. Borderline ascending aortic aneurysm, as on diagnostic CT. ABDOMEN/PELVIS: Multiple upper abdominal nodes demonstrate low-level, nonspecific hypermetabolism. An index retrocaval node measures 8 mm and a S.U.V. max of 2.7 on 125/4. A portal caval node measures 1.2 cm and a S.U.V. max of 3.3 on 128/4. Multifocal colonic hypermetabolism is primarily felt to be physiologic. There is focal anal hypermetabolism, slightly eccentric left, which measures a S.U.V. max of 11.9, including on 195/4. Incidental CT findings: Deferred to prior diagnostic CT. Hepatic steatosis and hepatomegaly. Left IVC. Aortic atherosclerosis. Extensive colonic diverticulosis. Lipoma within the right thigh musculature. SKELETON: No abnormal marrow activity. Incidental CT findings: none IMPRESSION: 1. Hypermetabolic distal esophageal primary. 2. Low-level hypermetabolism within upper abdominal nodes. These are technically nonspecific, but favored to be reactive in the setting of hepatic steatosis  and hepatomegaly. 3. Otherwise, no evidence of metastatic disease. 4. Anal hypermetabolism  could be physiologic. Consider correlation with physical exam and colonoscopy, if patient is not up-to-date. 5. Incidental findings, including: Aortic atherosclerosis (ICD10-I70.0), coronary artery atherosclerosis and emphysema (ICD10-J43.9). Sinus disease and bilateral mastoid effusions. Electronically Signed   By: Abigail Miyamoto M.D.   On: 09/20/2019 09:26       IMPRESSION/PLAN: 1. Stage IV, cT3N2M0 adenocarcinoma of the distal esophagus.Dr. Lisbeth Renshaw discusses the pathology findings and reviews the nature of esophageal cancer, the rationale to consider surgical resection when appropriate as well as chemoradiation.  He is interested in being aggressive with therapy but is somewhat hesitant about surgery.  He is waiting to be scheduled to meet with Dr. Servando Snare.  We discussed the risks, benefits, short, and long term effects of radiotherapy, and the patient is interested in proceeding. Dr. Lisbeth Renshaw discusses the delivery and logistics of radiotherapy and anticipates a course of 5 1/2 weeks of radiotherapy. He will come this afternoon at 2pm to proceed with simulation to begin on 09/30/19.    This encounter was provided by telemedicine platform [employer].  The patient has provided two factor identification and has given verbal consent for this type of encounter and has been advised to only accept a meeting of this type in a secure network environment. The time spent during this encounter was 60 minutes including preparation, discussion, and coordination of the patient's care. The attendants for this meeting include Blenda Nicely, RN, Dr. Lisbeth Renshaw, Hayden Pedro  and [name]. His wife Abdulazeez Mcphearson was available on the call as well.  During the encounter,  Blenda Nicely, RN, Dr. Lisbeth Renshaw, and Hayden Pedro were located at [facility] Radiation Oncology Department.  [name] and his wife were located at home.   The above documentation reflects my direct findings during this shared  patient visit. Please see the separate note by Dr. Lisbeth Renshaw on this date for the remainder of the patient's plan of care.    Carola Rhine, PAC

## 2019-09-24 NOTE — Telephone Encounter (Signed)
Let him know that he can chop up the pill if he wants to make it easier to swallow.  I will work with him on this as there is also a liquid type formulation

## 2019-09-25 ENCOUNTER — Telehealth: Payer: Self-pay | Admitting: Hematology

## 2019-09-25 ENCOUNTER — Telehealth: Payer: Self-pay | Admitting: Family Medicine

## 2019-09-25 MED ORDER — METFORMIN HCL 500 MG PO TABS: 500.0000 mg | ORAL_TABLET | Freq: Every day | ORAL | 2 refills | Status: DC

## 2019-09-25 NOTE — Telephone Encounter (Signed)
Pt did not want to bust up the pills and only wants the 500mg  . Pt was not happy yesterday that no one called him back but I advise pt that I was not here yesterday afternoon to call him back. Please advise if 500mg  can be sent in

## 2019-09-25 NOTE — Telephone Encounter (Signed)
Left detailed message about med

## 2019-09-25 NOTE — Telephone Encounter (Signed)
Due to his throat cancer issues he is not able to swallow the big 1000mg  metformin pills. He wants to try taking  500mg   metformin tablets 2 a day  He states he has already called about this previously but has not heard back and rx has not been changed. He is upset about this and frustrated.  I assured him that someone would get back to him today

## 2019-09-25 NOTE — Telephone Encounter (Signed)
He wants to try 500mg  pills

## 2019-09-25 NOTE — Telephone Encounter (Signed)
Scheduled appts per 4/5 los. Tried to schedule appts according to pt's preference. Pt stated that he would have his radiation appts moved and to try to schedule close to 10. Pt asked that I not call him back until later because [place] the [place]. Pt also stated that he would refer to mychart for next appts.

## 2019-09-26 ENCOUNTER — Other Ambulatory Visit: Payer: PPO

## 2019-09-26 ENCOUNTER — Inpatient Hospital Stay: Payer: PPO

## 2019-09-26 ENCOUNTER — Telehealth: Payer: Self-pay | Admitting: Family Medicine

## 2019-09-26 ENCOUNTER — Ambulatory Visit: Payer: PPO | Admitting: Hematology

## 2019-09-26 ENCOUNTER — Other Ambulatory Visit: Payer: Self-pay

## 2019-09-26 NOTE — Telephone Encounter (Signed)
Called pt and advised him that Dr Redmond School had sent in Metform 500 mg tablets so it would be easier for pt to swallow. Pt said Dr Redmond School called him last night.

## 2019-09-27 DIAGNOSIS — Z510 Encounter for antineoplastic radiation therapy: Secondary | ICD-10-CM | POA: Diagnosis not present

## 2019-09-27 DIAGNOSIS — C155 Malignant neoplasm of lower third of esophagus: Secondary | ICD-10-CM | POA: Diagnosis not present

## 2019-09-27 NOTE — Progress Notes (Signed)
Pharmacist Chemotherapy Monitoring - Initial Assessment    Anticipated start date: 09/30/19   Regimen:  . Are orders appropriate based on the patient's diagnosis, regimen, and cycle? Yes . Does the plan date match the patient's scheduled date? Yes . Is the sequencing of drugs appropriate? Yes . Are the premedications appropriate for the patient's regimen? Yes . Prior Authorization for treatment is: Approved o If applicable, is the correct biosimilar selected based on the patient's insurance? not applicable  Organ Function and Labs: Marland Kitchen Are dose adjustments needed based on the patient's renal function, hepatic function, or hematologic function? No . Are appropriate labs ordered prior to the start of patient's treatment? Yes . Other organ system assessment, if indicated: N/A . The following baseline labs, if indicated, have been ordered: N/A  Dose Assessment: . Are the drug doses appropriate? Yes . Are the following correct: o Drug concentrations Yes o IV fluid compatible with drug Yes o Administration routes Yes o Timing of therapy Yes . If applicable, does the patient have documented access for treatment and/or plans for port-a-cath placement? not applicable . If applicable, have lifetime cumulative doses been properly documented and assessed? yes Lifetime Dose Tracking  No doses have been documented on this patient for the following tracked chemicals: Doxorubicin, Epirubicin, Idarubicin, Daunorubicin, Mitoxantrone, Bleomycin, Oxaliplatin, Carboplatin, Liposomal Doxorubicin  o   Toxicity Monitoring/Prevention: . The patient has the following take home antiemetics prescribed: Prochlorperazine . The patient has the following take home medications prescribed: N/A . Medication allergies and previous infusion related reactions, if applicable, have been reviewed and addressed. Yes . The patient's current medication list has been assessed for drug-drug interactions with their chemotherapy  regimen. no significant drug-drug interactions were identified on review.  Order Review: . Are the treatment plan orders signed? Yes . Is the patient scheduled to see a provider prior to their treatment? Yes  I verify that I have reviewed each item in the above checklist and answered each question accordingly.  [facility] 09/27/2019 1:09 PM

## 2019-09-29 NOTE — Progress Notes (Signed)
[facility]   Telephone:(336) [phone] Fax:(336) [phone]   Clinic Follow up Note   Patient Care Team: Denita Lung, MD as PCP - General (Family Medicine) [name], MD as Consulting Physician (Gastroenterology) [name], MD as Consulting Physician (Hematology) Jonnie Finner, RN as Oncology Nurse Navigator 09/30/2019  CHIEF COMPLAINT: F/u esophagus cancer   SUMMARY OF ONCOLOGIC HISTORY: Oncology History  Esophageal cancer ([place])  08/30/2019 Procedure   EGD by Dr. [name] 08/30/19  IMPRESSION - Partially obstructing, malignant esophageal tumor was found in the lower third of the esophagus. Biopsied. Injected. - Normal stomach. - Normal examined duodenum.   08/30/2019 Initial Biopsy   FINAL MICROSCOPIC DIAGNOSIS:   A. ESOPHAGUS, BIOPSY:  - At least intramucosal adenocarcinoma.  - See comment.   COMMENT:   - The depth of invasion can not be determined due to the superficial  nature of the biopsy.  Dr.  Vic Ripper has reviewed the case and concurs  with this interpretation.  Additional studies can be performed upon  clinician request.    09/05/2019 Imaging   CT CAP W Contrast 09/05/19  IMPRESSION: 1. Soft tissue mass noted distal esophagus, compatible with known neoplasm. There is a small 6 mm short axis adjacent paraesophageal node, concerning for metastatic disease. 2. Upper normal to mildly enlarged hepatoduodenal ligament lymph node, with upper normal right para-aortic nodes. As metastatic disease cannot be excluded, PET-CT may prove helpful to further evaluate. 3. 1.9 x 1.3 cm nodular collection of soft tissue posterior to the descending duodenum. This is in close proximity to the pancreatic head but a definite communication of parenchyma between these 2 structures is not discernible by CT. 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 Attention on follow-up recommended. 4. 6 mm perifissural nodule right middle lobe,  likely a subpleural lymph node. Attention on follow-up recommended. 5. Hepatomegaly with hepatic steatosis. 6. Ascending thoracic aorta measures 4 cm diameter. Recommend annual imaging followup by CTA or MRA. This recommendation follows 2010 ACCF/AHA/AATS/ACR/ASA/SCA/SCAI/SIR/STS/SVM Guidelines for the Diagnosis and Management of Patients with Thoracic Aortic Disease. Circulation. 2010; [id number]. Aortic aneurysm NOS (ICD10-I71.9) 7. Left-sided IVC, normal variant.   09/11/2019 Tumor Marker   Baseline CEA 25.12 on 09/11/19   09/13/2019 Cancer Staging   Staging form: Esophagus - Adenocarcinoma, AJCC 8th Edition - Clinical stage from 09/13/2019: Stage IVA (cT3, cN2, cM0) - Signed by [name], MD on 09/22/2019   09/13/2019 Procedure   EUS by Dr. [name] 09/13/19  IMPRESSION - A mass was found in the lower third of the esophagus. A tissue diagnosis was obtained prior to this exam. This is of adenocarcinoma. This was staged T3 N3 Mx by endosonographic criteria. - No specimens collected.   09/20/2019 PET scan   PET IMPRESSION: 1. Hypermetabolic distal esophageal primary. 2. Low-level hypermetabolism within upper abdominal nodes. These are technically nonspecific, but favored to be reactive in the setting of hepatic steatosis and hepatomegaly. 3. Otherwise, no evidence of metastatic disease. 4. Anal hypermetabolism could be physiologic. Consider correlation with physical exam and colonoscopy, if patient is not up-to-date. 5. Incidental findings, including: Aortic atherosclerosis (ICD10-I70.0), coronary artery atherosclerosis and emphysema (ICD10-J43.9). Sinus disease and bilateral mastoid effusions.   09/22/2019 Initial Diagnosis   Esophageal cancer ([place])   09/30/2019 -  Chemotherapy   concurrent ChemoRT with weekly Carboplatin and Taxol starting 4/12   09/30/2019 -  Radiation Therapy   concurrent ChemoRT with Dr. Lisbeth Renshaw starting 4/12     CURRENT THERAPY: Neoadjuvant concurrent  chemoradiation with taxol and carboplatin, starting 09/30/19  INTERVAL HISTORY: James Dunn returns for f/u and treatment as scheduled. He had RT this morning. He is doing well. Continues to tolerate a normal diet except tough meats. He drinks 1-2 cups of coffee in the morning then water throughout the day. He has occasional dizzy spells on the golf course towards the end of the round. Started a couple weeks ago. No fall. Usually lasts 30 seconds. Not always associated with position change. His baseline productive cough is unchanged. Denies n/v/c/d, pain, fever, chills, chest pain, dyspnea, leg edema. He does not check BG at home.    MEDICAL HISTORY:  Past Medical History:  Diagnosis Date  . Allergy   . Asthma   . Diabetes mellitus without complication ([place])   . Diverticulosis   . GERD (gastroesophageal reflux disease) 2002  . Health maintenance examination     SURGICAL HISTORY: Past Surgical History:  Procedure Laterality Date  . BIOPSY  08/30/2019   Procedure: BIOPSY;  Surgeon: [name], MD;  Location: [facility];  Service: Endoscopy;;  . ESOPHAGOGASTRODUODENOSCOPY (EGD) WITH PROPOFOL N/A 08/30/2019   Procedure: ESOPHAGOGASTRODUODENOSCOPY (EGD) WITH PROPOFOL;  Surgeon: [name], MD;  Location: [facility];  Service: Endoscopy;  Laterality: N/A;  . ESOPHAGOGASTRODUODENOSCOPY (EGD) WITH PROPOFOL N/A 09/13/2019   Procedure: ESOPHAGOGASTRODUODENOSCOPY (EGD) WITH PROPOFOL;  Surgeon: [name], MD;  Location: [facility];  Service: Endoscopy;  Laterality: N/A;  . SKIN BIOPSY N/A 03/04/2019   upper back Nerofibroma and follicular cyst   . SUBMUCOSAL INJECTION  08/30/2019   Procedure: SUBMUCOSAL INJECTION;  Surgeon: [name], MD;  Location: [facility];  Service: Endoscopy;;  . UPPER ESOPHAGEAL ENDOSCOPIC ULTRASOUND (EUS) N/A 09/13/2019   Procedure: UPPER ESOPHAGEAL ENDOSCOPIC ULTRASOUND (EUS);  Surgeon: [name], MD;  Location: [facility];  Service: Endoscopy;   Laterality: N/A;    I have reviewed the social history and family history with the patient and they are unchanged from previous note.  ALLERGIES:  has No Known Allergies.  MEDICATIONS:  Current Outpatient Medications  Medication Sig Dispense Refill  . atorvastatin (LIPITOR) 10 MG tablet Take 10 mg by mouth daily.    Marland Kitchen lisinopril (ZESTRIL) 5 MG tablet Take 5 mg by mouth daily.    . metFORMIN (GLUCOPHAGE) 500 MG tablet Take 1 tablet (500 mg total) by mouth daily with breakfast. 30 tablet 2  . albuterol (VENTOLIN HFA) 108 (90 Base) MCG/ACT inhaler TAKE 2 PUFFS BY MOUTH EVERY 6 HOURS AS NEEDED FOR WHEEZE (Patient not taking: No sig reported) 18 g 0  . prochlorperazine (COMPAZINE) 10 MG tablet Take 1 tablet (10 mg total) by mouth every 6 (six) hours as needed (Nausea or vomiting). 30 tablet 1   No current facility-administered medications for this visit.   Facility-Administered Medications Ordered in Other Visits  Medication Dose Route Frequency Provider Last Rate Last Admin  . CARBOplatin (PARAPLATIN) 180 mg in sodium chloride 0.9 % 250 mL chemo infusion  180 mg Intravenous Once [name], MD      . PACLitaxel (TAXOL) 96 mg in sodium chloride 0.9 % 250 mL chemo infusion (</= 80mg /m2)  50 mg/m2 (Treatment Plan Recorded) Intravenous Once [name], MD        PHYSICAL EXAMINATION: ECOG PERFORMANCE STATUS: 1 - Symptomatic but completely ambulatory  Vitals:   09/30/19 0859  BP: 131/66  Pulse: 68  Resp: 20  Temp: 98.7 F (37.1 C)  SpO2: 100%   Filed Weights   09/30/19 0859  Weight: 176 lb  6.4 oz (80 kg)    GENERAL:alert, no distress and comfortable SKIN: no rash  EYES: sclera clear LUNGS: clear with normal breathing effort HEART: regular rate & rhythm ABDOMEN: round NEURO: alert & oriented x 3 with fluent speech, normal gait Limited exam for covid19 pandemic and no complaints   LABORATORY DATA:  I have reviewed the data as listed CBC Latest Ref Rng & Units 09/30/2019  09/11/2019 11/07/2018  WBC 4.0 - 10.5 K/uL 5.5 7.7 8.5  Hemoglobin 13.0 - 17.0 g/dL 11.9(L) 12.6(L) 15.8  Hematocrit 39.0 - 52.0 % 36.2(L) 37.5(L) 44.7  Platelets 150 - 400 K/uL 254 307 256     CMP Latest Ref Rng & Units 09/30/2019 09/11/2019 11/07/2018  Glucose 70 - 99 mg/dL 208(H) 223(H) 121(H)  BUN 8 - 23 mg/dL 11 11 10   Creatinine 0.61 - 1.24 mg/dL 1.21 1.24 1.15  Sodium 135 - 145 mmol/L 137 137 138  Potassium 3.5 - 5.1 mmol/L 3.9 5.0 4.6  Chloride 98 - 111 mmol/L 104 104 96  CO2 22 - 32 mmol/L 25 25 25   Calcium 8.9 - 10.3 mg/dL 9.2 9.3 9.9  Total Protein 6.5 - 8.1 g/dL 7.1 7.1 7.3  Total Bilirubin 0.3 - 1.2 mg/dL 0.5 0.4 0.6  Alkaline Phos 38 - 126 U/L 51 56 54  AST 15 - 41 U/L 15 16 20   ALT 0 - 44 U/L 22 27 27       RADIOGRAPHIC STUDIES: I have personally reviewed the radiological images as listed and agreed with the findings in the report. No results found.   ASSESSMENT & PLAN: 69 yo male with   1. Adenocarcinoma of distal esophagus - EGD 08/30/19 showed a large, fungating, partially circumferential mass in the lower third of the esophagus causing dysphagia. EUS 09/13/19 showed locally advanced adenocarcinoma invading the muscular layer and 2 enlarged LNs, T3N2. PET scan showed known esophageal mass and low-level hypermetabolism within the upper abdominal nodes, probably reactive. No evidence of distant metastasis (M0).  -Dr. [name] recommended standard treatment for locally advanced disease including neoadjuvant concurrent chemoRT with taxol and carboplatin. Treatment goal is curative.   -He is scheduled to see Dr. Servando Snare on 10/08/19 -James Dunn appears well. He remains able to tolerate normal function and normal diet except hard meats. Weight is stable. CBC normal except Hgb 11.9, CMP normal except BG 208. I encouraged him to check BG at home, we reviewed it will be elevated due to dex pre-med.  -We reviewed expected side effects of chemo and symptom management. We reviewed s/sx  to call and report such as fever, chills, rash, change in respiratory status, uncontrolled GI symptoms, severe fatigue, or difficulty with po intake. -He began RT this morning. He will proceed with day/week 1 carboplatin and taxol today.  -F/u with each weekly chemoRT  2. Solid dysphagia -started early 2021, secondary to #1 -he tolerates normal diet for the most part. Some meat and large pills get stuck  -no significant weight loss, I do not feel he needs feeding tube at this time -weight remains stable; can start glucerna if needed   3. Tobacco and alcohol use -he has long term tobacco abuse, quit in 03/2019, on 09/23/19 he reportedly had quit drinking also   4. HTN, HL, DM -Managed by PCP, on lisinopril, atorvastatin, and metformin -BG today 208, I encouraged him to check routinely at home -He has occasional dizzy spells on the golf course, he thinks he hydrates adequately. No fall. He does not have significant  orthostatic hypotension today in clinic. I encouraged him to continue to hydrate, and he can hold lisinopril on days he plays golf    PLAN: -Labs reviewed -Proceed with week 1 taxol and carboplatin today -can hold lisinopril on days he plays golf, continue adequate hydration  -Lab/follow up with weekly chemo, continue RT -Consult Dr. Servando Snare on 4/20 as planned   All questions were answered. The patient knows to call the clinic with any problems, questions or concerns. No barriers to learning was detected.     Alla Feeling, NP 09/30/19

## 2019-09-30 ENCOUNTER — Inpatient Hospital Stay: Payer: PPO

## 2019-09-30 ENCOUNTER — Inpatient Hospital Stay: Payer: PPO | Admitting: Nurse Practitioner

## 2019-09-30 ENCOUNTER — Other Ambulatory Visit: Payer: Self-pay | Admitting: Hematology

## 2019-09-30 ENCOUNTER — Other Ambulatory Visit: Payer: Self-pay

## 2019-09-30 ENCOUNTER — Ambulatory Visit
Admission: RE | Admit: 2019-09-30 | Discharge: 2019-09-30 | Disposition: A | Discharge status: Home / Self Care | Payer: PPO | Source: Ambulatory Visit | Attending: Radiation Oncology | Admitting: Radiation Oncology

## 2019-09-30 ENCOUNTER — Encounter: Payer: Self-pay | Admitting: Nurse Practitioner

## 2019-09-30 VITALS — BP 122/70 | HR 61 | Temp 98.4°F | Resp 18

## 2019-09-30 VITALS — BP 131/66 | HR 68 | Temp 98.7°F | Resp 20 | Ht 67.0 in | Wt 176.4 lb

## 2019-09-30 DIAGNOSIS — Z5111 Encounter for antineoplastic chemotherapy: Secondary | ICD-10-CM | POA: Diagnosis not present

## 2019-09-30 DIAGNOSIS — C155 Malignant neoplasm of lower third of esophagus: Secondary | ICD-10-CM | POA: Diagnosis not present

## 2019-09-30 DIAGNOSIS — Z510 Encounter for antineoplastic radiation therapy: Secondary | ICD-10-CM | POA: Diagnosis not present

## 2019-09-30 LAB — CBC WITH DIFFERENTIAL (CANCER CENTER ONLY)
Abs Immature Granulocytes: 0.02 10*3/uL (ref 0.00–0.07)
Basophils Absolute: 0 10*3/uL (ref 0.0–0.1)
Basophils Relative: 1 %
Eosinophils Absolute: 0.3 10*3/uL (ref 0.0–0.5)
Eosinophils Relative: 5 %
HCT: 36.2 % — ABNORMAL LOW (ref 39.0–52.0)
Hemoglobin: 11.9 g/dL — ABNORMAL LOW (ref 13.0–17.0)
Immature Granulocytes: 0 %
Lymphocytes Relative: 24 %
Lymphs Abs: 1.3 10*3/uL (ref 0.7–4.0)
MCH: 30.4 pg (ref 26.0–34.0)
MCHC: 32.9 g/dL (ref 30.0–36.0)
MCV: 92.6 fL (ref 80.0–100.0)
Monocytes Absolute: 0.3 10*3/uL (ref 0.1–1.0)
Monocytes Relative: 6 %
Neutro Abs: 3.6 10*3/uL (ref 1.7–7.7)
Neutrophils Relative %: 64 %
Platelet Count: 254 10*3/uL (ref 150–400)
RBC: 3.91 MIL/uL — ABNORMAL LOW (ref 4.22–5.81)
RDW: 12.3 % (ref 11.5–15.5)
WBC Count: 5.5 10*3/uL (ref 4.0–10.5)
nRBC: 0 % (ref 0.0–0.2)

## 2019-09-30 LAB — CMP (CANCER CENTER ONLY)
ALT: 22 U/L (ref 0–44)
AST: 15 U/L (ref 15–41)
Albumin: 4.1 g/dL (ref 3.5–5.0)
Alkaline Phosphatase: 51 U/L (ref 38–126)
Anion gap: 8 (ref 5–15)
BUN: 11 mg/dL (ref 8–23)
CO2: 25 mmol/L (ref 22–32)
Calcium: 9.2 mg/dL (ref 8.9–10.3)
Chloride: 104 mmol/L (ref 98–111)
Creatinine: 1.21 mg/dL (ref 0.61–1.24)
GFR, Est AFR Am: >60 mL/min (ref >60)
GFR, Estimated: >60 mL/min (ref >60)
Glucose, Bld: 208 mg/dL — ABNORMAL HIGH (ref 70–99)
Potassium: 3.9 mmol/L (ref 3.5–5.1)
Sodium: 137 mmol/L (ref 135–145)
Total Bilirubin: 0.5 mg/dL (ref 0.3–1.2)
Total Protein: 7.1 g/dL (ref 6.5–8.1)

## 2019-09-30 MED ORDER — PALONOSETRON HCL INJECTION 0.25 MG/5ML
INTRAVENOUS | Status: AC
  Filled 2019-09-30: qty 5

## 2019-09-30 MED ORDER — SODIUM CHLORIDE 0.9 % IV SOLN
Freq: Once | INTRAVENOUS | Status: AC
  Filled 2019-09-30: qty 250

## 2019-09-30 MED ORDER — SODIUM CHLORIDE 0.9 % IV SOLN
50.0000 mg/m2 | Freq: Once | INTRAVENOUS | Status: AC
  Administered 2019-09-30: 96 mg via INTRAVENOUS
  Filled 2019-09-30: qty 16

## 2019-09-30 MED ORDER — FAMOTIDINE IN NACL 20-0.9 MG/50ML-% IV SOLN
20.0000 mg | Freq: Once | INTRAVENOUS | Status: AC
  Administered 2019-09-30: 20 mg via INTRAVENOUS

## 2019-09-30 MED ORDER — DIPHENHYDRAMINE HCL 50 MG/ML IJ SOLN
25.0000 mg | Freq: Once | INTRAMUSCULAR | Status: AC
  Administered 2019-09-30: 25 mg via INTRAVENOUS

## 2019-09-30 MED ORDER — DIPHENHYDRAMINE HCL 50 MG/ML IJ SOLN
INTRAMUSCULAR | Status: AC
  Filled 2019-09-30: qty 1

## 2019-09-30 MED ORDER — FAMOTIDINE IN NACL 20-0.9 MG/50ML-% IV SOLN
INTRAVENOUS | Status: AC
  Filled 2019-09-30: qty 50

## 2019-09-30 MED ORDER — PALONOSETRON HCL INJECTION 0.25 MG/5ML
0.2500 mg | Freq: Once | INTRAVENOUS | Status: AC
  Administered 2019-09-30: 0.25 mg via INTRAVENOUS

## 2019-09-30 MED ORDER — SODIUM CHLORIDE 0.9 % IV SOLN
181.2000 mg | Freq: Once | INTRAVENOUS | Status: AC
  Administered 2019-09-30: 180 mg via INTRAVENOUS
  Filled 2019-09-30: qty 18

## 2019-09-30 MED ORDER — SODIUM CHLORIDE 0.9 % IV SOLN
10.0000 mg | Freq: Once | INTRAVENOUS | Status: AC
  Administered 2019-09-30: 10 mg via INTRAVENOUS
  Filled 2019-09-30: qty 10

## 2019-09-30 NOTE — Patient Instructions (Addendum)
[facility] Discharge Instructions for Patients Receiving Chemotherapy  Today you received the following chemotherapy agents: Taxol, Carboplatin  To help prevent nausea and vomiting after your treatment, we encourage you to take your nausea medication as directed.   If you develop nausea and vomiting that is not controlled by your nausea medication, call the clinic.   BELOW ARE SYMPTOMS THAT SHOULD BE REPORTED IMMEDIATELY:  *FEVER GREATER THAN 100.5 F  *CHILLS WITH OR WITHOUT FEVER  NAUSEA AND VOMITING THAT IS NOT CONTROLLED WITH YOUR NAUSEA MEDICATION  *UNUSUAL SHORTNESS OF BREATH  *UNUSUAL BRUISING OR BLEEDING  TENDERNESS IN MOUTH AND THROAT WITH OR WITHOUT PRESENCE OF ULCERS  *URINARY PROBLEMS  *BOWEL PROBLEMS  UNUSUAL RASH Items with * indicate a potential emergency and should be followed up as soon as possible.  Feel free to call the clinic should you have any questions or concerns. The clinic phone number is [phone].  Please show the CHEMO ALERT CARD at check-in to the Emergency Department and triage nurse.  Paclitaxel injection What is this medicine? PACLITAXEL (PAK li TAX el) is a chemotherapy drug. It targets fast dividing cells, like cancer cells, and causes these cells to die. This medicine is used to treat ovarian cancer, breast cancer, lung cancer, Kaposi's sarcoma, and other cancers. This medicine may be used for other purposes; ask your health care provider or pharmacist if you have questions. COMMON BRAND NAME(S): Onxol, Taxol What should I tell my health care provider before I take this medicine? They need to know if you have any of these conditions:  history of irregular heartbeat  liver disease  low blood counts, like low white cell, platelet, or red cell counts  lung or breathing disease, like asthma  tingling of the fingers or toes, or other nerve disorder  an unusual or allergic reaction to paclitaxel, alcohol,  polyoxyethylated castor oil, other chemotherapy, other medicines, foods, dyes, or preservatives  pregnant or trying to get pregnant  breast-feeding How should I use this medicine? This drug is given as an infusion into a vein. It is administered in a hospital or clinic by a specially trained health care professional. Talk to your pediatrician regarding the use of this medicine in children. Special care may be needed. Overdosage: If you think you have taken too much of this medicine contact a poison control center or emergency room at once. NOTE: This medicine is only for you. Do not share this medicine with others. What if I miss a dose? It is important not to miss your dose. Call your doctor or health care professional if you are unable to keep an appointment. What may interact with this medicine? Do not take this medicine with any of the following medications:  disulfiram  metronidazole This medicine may also interact with the following medications:  antiviral medicines for hepatitis, HIV or AIDS  certain antibiotics like erythromycin and clarithromycin  certain medicines for fungal infections like ketoconazole and itraconazole  certain medicines for seizures like carbamazepine, phenobarbital, phenytoin  gemfibrozil  nefazodone  rifampin  St. John's wort This list may not describe all possible interactions. Give your health care provider a list of all the medicines, herbs, non-prescription drugs, or dietary supplements you use. Also tell them if you smoke, drink alcohol, or use illegal drugs. Some items may interact with your medicine. What should I watch for while using this medicine? Your condition will be monitored carefully while you are receiving this medicine. You will need important blood   work done while you are taking this medicine. This medicine can cause serious allergic reactions. To reduce your risk you will need to take other medicine(s) before treatment with this  medicine. If you experience allergic reactions like skin rash, itching or hives, swelling of the face, lips, or tongue, tell your doctor or health care professional right away. In some cases, you may be given additional medicines to help with side effects. Follow all directions for their use. This drug may make you feel generally unwell. This is not uncommon, as chemotherapy can affect healthy cells as well as cancer cells. Report any side effects. Continue your course of treatment even though you feel ill unless your doctor tells you to stop. Call your doctor or health care professional for advice if you get a fever, chills or sore throat, or other symptoms of a cold or flu. Do not treat yourself. This drug decreases your body's ability to fight infections. Try to avoid being around people who are sick. This medicine may increase your risk to bruise or bleed. Call your doctor or health care professional if you notice any unusual bleeding. Be careful brushing and flossing your teeth or using a toothpick because you may get an infection or bleed more easily. If you have any dental work done, tell your dentist you are receiving this medicine. Avoid taking products that contain aspirin, acetaminophen, ibuprofen, naproxen, or ketoprofen unless instructed by your doctor. These medicines may hide a fever. Do not become pregnant while taking this medicine. Women should inform their doctor if they wish to become pregnant or think they might be pregnant. There is a potential for serious side effects to an unborn child. Talk to your health care professional or pharmacist for more information. Do not breast-feed an infant while taking this medicine. Men are advised not to father a child while receiving this medicine. This product may contain alcohol. Ask your pharmacist or healthcare provider if this medicine contains alcohol. Be sure to tell all healthcare providers you are taking this medicine. Certain medicines,  like metronidazole and disulfiram, can cause an unpleasant reaction when taken with alcohol. The reaction includes flushing, headache, nausea, vomiting, sweating, and increased thirst. The reaction can last from 30 minutes to several hours. What side effects may I notice from receiving this medicine? Side effects that you should report to your doctor or health care professional as soon as possible:  allergic reactions like skin rash, itching or hives, swelling of the face, lips, or tongue  breathing problems  changes in vision  fast, irregular heartbeat  high or low blood pressure  mouth sores  pain, tingling, numbness in the hands or feet  signs of decreased platelets or bleeding - bruising, pinpoint red spots on the skin, black, tarry stools, blood in the urine  signs of decreased red blood cells - unusually weak or tired, feeling faint or lightheaded, falls  signs of infection - fever or chills, cough, sore throat, pain or difficulty passing urine  signs and symptoms of liver injury like dark yellow or brown urine; general ill feeling or flu-like symptoms; light-colored stools; loss of appetite; nausea; right upper belly pain; unusually weak or tired; yellowing of the eyes or skin  swelling of the ankles, feet, hands  unusually slow heartbeat Side effects that usually do not require medical attention (report to your doctor or health care professional if they continue or are bothersome):  diarrhea  hair loss  loss of appetite  muscle or joint pain    nausea, vomiting  pain, redness, or irritation at site where injected  tiredness This list may not describe all possible side effects. Call your doctor for medical advice about side effects. You may report side effects to FDA at 1-800-FDA-1088. Where should I keep my medicine? This drug is given in a hospital or clinic and will not be stored at home. NOTE: This sheet is a summary. It may not cover all possible information.  If you have questions about this medicine, talk to your doctor, pharmacist, or health care provider.  2020 [employer]/Gold Standard (2017-02-07 13:14:55)  Carboplatin injection What is this medicine? CARBOPLATIN (KAR boe pla tin) is a chemotherapy drug. It targets fast dividing cells, like cancer cells, and causes these cells to die. This medicine is used to treat ovarian cancer and many other cancers. This medicine may be used for other purposes; ask your health care provider or pharmacist if you have questions. COMMON BRAND NAME(S): Paraplatin What should I tell my health care provider before I take this medicine? They need to know if you have any of these conditions:  blood disorders  hearing problems  kidney disease  recent or ongoing radiation therapy  an unusual or allergic reaction to carboplatin, cisplatin, other chemotherapy, other medicines, foods, dyes, or preservatives  pregnant or trying to get pregnant  breast-feeding How should I use this medicine? This drug is usually given as an infusion into a vein. It is administered in a hospital or clinic by a specially trained health care professional. Talk to your pediatrician regarding the use of this medicine in children. Special care may be needed. Overdosage: If you think you have taken too much of this medicine contact a poison control center or emergency room at once. NOTE: This medicine is only for you. Do not share this medicine with others. What if I miss a dose? It is important not to miss a dose. Call your doctor or health care professional if you are unable to keep an appointment. What may interact with this medicine?  medicines for seizures  medicines to increase blood counts like filgrastim, pegfilgrastim, sargramostim  some antibiotics like amikacin, gentamicin, neomycin, streptomycin, tobramycin  vaccines Talk to your doctor or health care professional before taking any of these  medicines:  acetaminophen  aspirin  ibuprofen  ketoprofen  naproxen This list may not describe all possible interactions. Give your health care provider a list of all the medicines, herbs, non-prescription drugs, or dietary supplements you use. Also tell them if you smoke, drink alcohol, or use illegal drugs. Some items may interact with your medicine. What should I watch for while using this medicine? Your condition will be monitored carefully while you are receiving this medicine. You will need important blood work done while you are taking this medicine. This drug may make you feel generally unwell. This is not uncommon, as chemotherapy can affect healthy cells as well as cancer cells. Report any side effects. Continue your course of treatment even though you feel ill unless your doctor tells you to stop. In some cases, you may be given additional medicines to help with side effects. Follow all directions for their use. Call your doctor or health care professional for advice if you get a fever, chills or sore throat, or other symptoms of a cold or flu. Do not treat yourself. This drug decreases your body's ability to fight infections. Try to avoid being around people who are sick. This medicine may increase your risk to bruise or bleed.   Call your doctor or health care professional if you notice any unusual bleeding. Be careful brushing and flossing your teeth or using a toothpick because you may get an infection or bleed more easily. If you have any dental work done, tell your dentist you are receiving this medicine. Avoid taking products that contain aspirin, acetaminophen, ibuprofen, naproxen, or ketoprofen unless instructed by your doctor. These medicines may hide a fever. Do not become pregnant while taking this medicine. Women should inform their doctor if they wish to become pregnant or think they might be pregnant. There is a potential for serious side effects to an unborn child. Talk  to your health care professional or pharmacist for more information. Do not breast-feed an infant while taking this medicine. What side effects may I notice from receiving this medicine? Side effects that you should report to your doctor or health care professional as soon as possible:  allergic reactions like skin rash, itching or hives, swelling of the face, lips, or tongue  signs of infection - fever or chills, cough, sore throat, pain or difficulty passing urine  signs of decreased platelets or bleeding - bruising, pinpoint red spots on the skin, black, tarry stools, nosebleeds  signs of decreased red blood cells - unusually weak or tired, fainting spells, lightheadedness  breathing problems  changes in hearing  changes in vision  chest pain  high blood pressure  low blood counts - This drug may decrease the number of white blood cells, red blood cells and platelets. You may be at increased risk for infections and bleeding.  nausea and vomiting  pain, swelling, redness or irritation at the injection site  pain, tingling, numbness in the hands or feet  problems with balance, talking, walking  trouble passing urine or change in the amount of urine Side effects that usually do not require medical attention (report to your doctor or health care professional if they continue or are bothersome):  hair loss  loss of appetite  metallic taste in the mouth or changes in taste This list may not describe all possible side effects. Call your doctor for medical advice about side effects. You may report side effects to FDA at 1-800-FDA-1088. Where should I keep my medicine? This drug is given in a hospital or clinic and will not be stored at home. NOTE: This sheet is a summary. It may not cover all possible information. If you have questions about this medicine, talk to your doctor, pharmacist, or health care provider.  2020 [employer]vier/Gold Standard (2007-09-11 14:38:05)  

## 2019-10-01 ENCOUNTER — Other Ambulatory Visit: Payer: Self-pay

## 2019-10-01 ENCOUNTER — Ambulatory Visit
Admission: RE | Admit: 2019-10-01 | Discharge: 2019-10-01 | Disposition: A | Discharge status: Home / Self Care | Payer: PPO | Source: Ambulatory Visit | Attending: Radiation Oncology | Admitting: Radiation Oncology

## 2019-10-01 DIAGNOSIS — C155 Malignant neoplasm of lower third of esophagus: Secondary | ICD-10-CM | POA: Diagnosis not present

## 2019-10-01 DIAGNOSIS — Z510 Encounter for antineoplastic radiation therapy: Secondary | ICD-10-CM | POA: Diagnosis not present

## 2019-10-01 NOTE — Progress Notes (Signed)
Pharmacist Chemotherapy Monitoring - Follow Up Assessment    I verify that I have reviewed each item in the below checklist:  . Regimen for the patient is scheduled for the appropriate day and plan matches scheduled date. Marland Kitchen Appropriate non-routine labs are ordered dependent on drug ordered. . If applicable, additional medications reviewed and ordered per protocol based on lifetime cumulative doses and/or treatment regimen.   Plan for follow-up and/or issues identified: No . I-vent associated with next due treatment: No . MD and/or nursing notified: No  Romualdo Bolk [facility] 10/01/2019 11:50 AM

## 2019-10-02 ENCOUNTER — Other Ambulatory Visit: Payer: Self-pay

## 2019-10-02 ENCOUNTER — Ambulatory Visit
Admission: RE | Admit: 2019-10-02 | Discharge: 2019-10-02 | Disposition: A | Discharge status: Home / Self Care | Payer: PPO | Source: Ambulatory Visit | Attending: Radiation Oncology | Admitting: Radiation Oncology

## 2019-10-02 DIAGNOSIS — Z510 Encounter for antineoplastic radiation therapy: Secondary | ICD-10-CM | POA: Diagnosis not present

## 2019-10-02 DIAGNOSIS — C155 Malignant neoplasm of lower third of esophagus: Secondary | ICD-10-CM | POA: Diagnosis not present

## 2019-10-02 NOTE — Progress Notes (Signed)
Patient called to review his appointments.  Informed him of times for next 3 infusions, he has some RT appointments he needs to be moved and he will contact them regarding this.

## 2019-10-03 ENCOUNTER — Other Ambulatory Visit: Payer: Self-pay

## 2019-10-03 ENCOUNTER — Ambulatory Visit
Admission: RE | Admit: 2019-10-03 | Discharge: 2019-10-03 | Disposition: A | Discharge status: Home / Self Care | Payer: PPO | Source: Ambulatory Visit | Attending: Radiation Oncology | Admitting: Radiation Oncology

## 2019-10-03 DIAGNOSIS — Z510 Encounter for antineoplastic radiation therapy: Secondary | ICD-10-CM | POA: Diagnosis not present

## 2019-10-03 DIAGNOSIS — C155 Malignant neoplasm of lower third of esophagus: Secondary | ICD-10-CM | POA: Diagnosis not present

## 2019-10-03 NOTE — Progress Notes (Signed)
[place]   [phone] [phone]   Clinic Follow up Note   Patient Care Team: Denita Lung, MD as PCP - General (Family Medicine) [name], MD as Consulting Physician (Gastroenterology) [name], MD as Consulting Physician (Hematology) Jonnie Finner, RN as Oncology Nurse Navigator  Date of Service:  10/07/2019  CHIEF COMPLAINT: F/u of Esophagus cancer  SUMMARY OF ONCOLOGIC HISTORY: Oncology History  Esophageal cancer ([place])  08/30/2019 Procedure   EGD by Dr. [name] 08/30/19  IMPRESSION - Partially obstructing, malignant esophageal tumor was found in the lower third of the esophagus. Biopsied. Injected. - Normal stomach. - Normal examined duodenum.   08/30/2019 Initial Biopsy   FINAL MICROSCOPIC DIAGNOSIS:   A. ESOPHAGUS, BIOPSY:  - At least intramucosal adenocarcinoma.  - See comment.   COMMENT:   - The depth of invasion can not be determined due to the superficial  nature of the biopsy.  Dr.  Vic Ripper has reviewed the case and concurs  with this interpretation.  Additional studies can be performed upon  clinician request.    09/05/2019 Imaging   CT CAP W Contrast 09/05/19  IMPRESSION: 1. Soft tissue mass noted distal esophagus, compatible with known neoplasm. There is a small 6 mm short axis adjacent paraesophageal node, concerning for metastatic disease. 2. Upper normal to mildly enlarged hepatoduodenal ligament lymph node, with upper normal right para-aortic nodes. As metastatic disease cannot be excluded, PET-CT may prove helpful to further evaluate. 3. 1.9 x 1.3 cm nodular collection of soft tissue posterior to the descending duodenum. This is in close proximity to the pancreatic head but a definite communication of parenchyma between these 2 structures is not discernible by CT. 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 Attention on follow-up recommended. 4. 6 mm perifissural nodule  right middle lobe, likely a subpleural lymph node. Attention on follow-up recommended. 5. Hepatomegaly with hepatic steatosis. 6. Ascending thoracic aorta measures 4 cm diameter. Recommend annual imaging followup by CTA or MRA. This recommendation follows 2010 ACCF/AHA/AATS/ACR/ASA/SCA/SCAI/SIR/STS/SVM Guidelines for the Diagnosis and Management of Patients with Thoracic Aortic Disease. Circulation. 2010; [id number]. Aortic aneurysm NOS (ICD10-I71.9) 7. Left-sided IVC, normal variant.   09/11/2019 Tumor Marker   Baseline CEA 25.12 on 09/11/19   09/13/2019 Cancer Staging   Staging form: Esophagus - Adenocarcinoma, AJCC 8th Edition - Clinical stage from 09/13/2019: Stage IVA (cT3, cN2, cM0) - Signed by [name], MD on 09/22/2019   09/13/2019 Procedure   EUS by Dr. [name] 09/13/19  IMPRESSION - A mass was found in the lower third of the esophagus. A tissue diagnosis was obtained prior to this exam. This is of adenocarcinoma. This was staged T3 N3 Mx by endosonographic criteria. - No specimens collected.   09/20/2019 PET scan   PET IMPRESSION: 1. Hypermetabolic distal esophageal primary. 2. Low-level hypermetabolism within upper abdominal nodes. These are technically nonspecific, but favored to be reactive in the setting of hepatic steatosis and hepatomegaly. 3. Otherwise, no evidence of metastatic disease. 4. Anal hypermetabolism could be physiologic. Consider correlation with physical exam and colonoscopy, if patient is not up-to-date. 5. Incidental findings, including: Aortic atherosclerosis (ICD10-I70.0), coronary artery atherosclerosis and emphysema (ICD10-J43.9). Sinus disease and bilateral mastoid effusions.   09/22/2019 Initial Diagnosis   Esophageal cancer ([place])   09/30/2019 -  Chemotherapy   concurrent ChemoRT with weekly Carboplatin and Taxol starting 4/12   09/30/2019 -  Radiation Therapy   concurrent ChemoRT with Dr. Lisbeth Renshaw starting 4/12  CURRENT THERAPY:    concurrent chemoRT with weekly CT for 6 weeks starting 09/30/19   INTERVAL HISTORY:  James Dunn is here for a follow up and treatment. He presents to the clinic alone. He notes he tolerated first week of chemoRT mostly well. He has mild nausea and only needed 1 tab of compazine. He notes he tries to drink 1 gallon of water a day and drinks Glucerna 2 bottles a day. He notes he had cereal and banana for breakfast. He notes he does not check his BG much.  He plans to have a small golf trip this weekend. He notes his frustration with appointments being too spread out.    REVIEW OF SYSTEMS:   Constitutional: Denies fevers, chills or abnormal weight loss Eyes: Denies blurriness of vision Ears, nose, mouth, throat, and face: Denies mucositis or sore throat Respiratory: Denies cough, dyspnea or wheezes Cardiovascular: Denies palpitation, chest discomfort or lower extremity swelling Gastrointestinal:  Denies nausea, heartburn or change in bowel habits Skin: Denies abnormal skin rashes Lymphatics: Denies new lymphadenopathy or easy bruising Neurological:Denies numbness, tingling or new weaknesses Behavioral/Psych: Mood is stable, no new changes  All other systems were reviewed with the patient and are negative.  MEDICAL HISTORY:  Past Medical History:  Diagnosis Date  . Allergy   . Asthma   . Diabetes mellitus without complication ([place])   . Diverticulosis   . GERD (gastroesophageal reflux disease) 2002  . Health maintenance examination     SURGICAL HISTORY: Past Surgical History:  Procedure Laterality Date  . BIOPSY  08/30/2019   Procedure: BIOPSY;  Surgeon: [name], MD;  Location: [facility];  Service: Endoscopy;;  . ESOPHAGOGASTRODUODENOSCOPY (EGD) WITH PROPOFOL N/A 08/30/2019   Procedure: ESOPHAGOGASTRODUODENOSCOPY (EGD) WITH PROPOFOL;  Surgeon: [name], MD;  Location: [facility];  Service: Endoscopy;  Laterality: N/A;  . ESOPHAGOGASTRODUODENOSCOPY (EGD) WITH  PROPOFOL N/A 09/13/2019   Procedure: ESOPHAGOGASTRODUODENOSCOPY (EGD) WITH PROPOFOL;  Surgeon: [name], MD;  Location: [facility];  Service: Endoscopy;  Laterality: N/A;  . SKIN BIOPSY N/A 03/04/2019   upper back Nerofibroma and follicular cyst   . SUBMUCOSAL INJECTION  08/30/2019   Procedure: SUBMUCOSAL INJECTION;  Surgeon: [name], MD;  Location: [facility];  Service: Endoscopy;;  . UPPER ESOPHAGEAL ENDOSCOPIC ULTRASOUND (EUS) N/A 09/13/2019   Procedure: UPPER ESOPHAGEAL ENDOSCOPIC ULTRASOUND (EUS);  Surgeon: [name], MD;  Location: [facility];  Service: Endoscopy;  Laterality: N/A;    I have reviewed the social history and family history with the patient and they are unchanged from previous note.  ALLERGIES:  has No Known Allergies.  MEDICATIONS:  Current Outpatient Medications  Medication Sig Dispense Refill  . albuterol (VENTOLIN HFA) 108 (90 Base) MCG/ACT inhaler TAKE 2 PUFFS BY MOUTH EVERY 6 HOURS AS NEEDED FOR WHEEZE (Patient not taking: No sig reported) 18 g 0  . atorvastatin (LIPITOR) 10 MG tablet Take 10 mg by mouth daily.    Marland Kitchen lisinopril (ZESTRIL) 5 MG tablet Take 5 mg by mouth daily.    . metFORMIN (GLUCOPHAGE) 500 MG tablet Take 1 tablet (500 mg total) by mouth daily with breakfast. 30 tablet 2  . prochlorperazine (COMPAZINE) 10 MG tablet Take 1 tablet (10 mg total) by mouth every 6 (six) hours as needed (Nausea or vomiting). 30 tablet 1   No current facility-administered medications for this visit.    PHYSICAL EXAMINATION: ECOG PERFORMANCE STATUS: 1 - Symptomatic but completely ambulatory  Vitals:   10/07/19 1106  BP: 119/73  Pulse:  70  Resp: 18  Temp: 98.7 F (37.1 C)  SpO2: 100%   Filed Weights   10/07/19 1106  Weight: 173 lb 14.4 oz (78.9 kg)    Due to COVID19 we will limit examination to appearance. Patient had no complaints.  GENERAL:alert, no distress and comfortable SKIN: skin color normal, no rashes or significant lesions EYES:  normal, Conjunctiva are pink and non-injected, sclera clear  NEURO: alert & oriented x 3 with fluent speech   LABORATORY DATA:  I have reviewed the data as listed CBC Latest Ref Rng & Units 10/07/2019 09/30/2019 09/11/2019  WBC 4.0 - 10.5 K/uL 3.4(L) 5.5 7.7  Hemoglobin 13.0 - 17.0 g/dL 11.8(L) 11.9(L) 12.6(L)  Hematocrit 39.0 - 52.0 % 34.5(L) 36.2(L) 37.5(L)  Platelets 150 - 400 K/uL 248 254 307     CMP Latest Ref Rng & Units 10/07/2019 09/30/2019 09/11/2019  Glucose 70 - 99 mg/dL 249(H) 208(H) 223(H)  BUN 8 - 23 mg/dL 12 11 11   Creatinine 0.61 - 1.24 mg/dL 1.12 1.21 1.24  Sodium 135 - 145 mmol/L 135 137 137  Potassium 3.5 - 5.1 mmol/L 4.2 3.9 5.0  Chloride 98 - 111 mmol/L 102 104 104  CO2 22 - 32 mmol/L 24 25 25   Calcium 8.9 - 10.3 mg/dL 8.8(L) 9.2 9.3  Total Protein 6.5 - 8.1 g/dL 6.9 7.1 7.1  Total Bilirubin 0.3 - 1.2 mg/dL 0.5 0.5 0.4  Alkaline Phos 38 - 126 U/L 46 51 56  AST 15 - 41 U/L 12(L) 15 16  ALT 0 - 44 U/L 17 22 27       RADIOGRAPHIC STUDIES: I have personally reviewed the radiological images as listed and agreed with the findings in the report. No results found.   ASSESSMENT & PLAN:  DYAMI MAMONE is a 70 y.o. male with    1. Adenocarcinoma of distal esophagus, T3N2M0, stage IVA -His 08/30/19 EGD showed a large, fungating, partially circumferential mass in the lower third of the esophagus causing dysphagia. His PET from 09/20/19 showed no evidence of metastatic disease.  -His EUS from 09/13/19 which showed locally advanced adenocarcinoma invading the muscular layer with 2 enlarged LNs, T3N2M0. -I discussed surgery is curative. I reviewed the aggressive nature of esophageal cancer, and the high risk of recurrence after surgery. I recommended neoadjuvant CCRT to downstage his cancer before surgery.  -I started him on standard treatment with neoadjuvant concurrent ChemoRT with weekly Carboplatin and Taxol for 6 weeks beginning 09/30/19.  -S/p week 1 he tolerated moderately  well with mild nausea. He continues to work on nutrition and maintaining weight.  -Labs reviewed, CBC and CMP WNL except WBC 3.4, Hg 11.8, BG 249, Ca 8.8. Overall adequate to proceed with Week 2 CT today.  -Continue RT  -F/u next week    2. Dysphagia with solid food -started early 2021, secondary to #1 -he tolerates normal diet for the most part. Some meat and large pills get stuck such as his metformin -He did not have initial significant weight loss, I do not feel he needs feeding tube at this time, but I discussed he may need tube feeding with surgery. Will monitor closely. I encouraged him to chew well and drink plenty of fluid.  -He will continue to f/u with Dietician. He currently drinks 2 Glucerna daily and drinking about 1 gal of water daily.  -He has sublingual Zofran for antiemetics during chemo.    3. H/o Tobacco and alcohol use -He has long term tobacco abuse, quit in  03/2019. He notes he has cut out alcohol recently (09/23/19). I recommend he continue cessation.   4. HTN, HL, DM, GERD  -Managed by PCP, on lisinopril, atorvastatin, and metformin -His DM is not well controlled. He notes he may not continue to swallow his metformin soon. I discussed he may need to switch to insulin to control his DM. He is apprehensive about this.  -He does have acid reflux which has been flaring lately. He is on antiacid, I recommend he continue especially on chemo.  -His BG is 249 today (10/07/19). I recommend he monitor his BG at least 1-2 times and to reduce sugar a few days around his chemo treatment given steroids.    PLAN: -Labs reviewed and adequate to proceed with week 2 chemo CT today  -Continue Radiation  -Lab, F/u and CT next week    No problem-specific Assessment & Plan notes found for this encounter.   No orders of the defined types were placed in this encounter.  All questions were answered. The patient knows to call the clinic with any problems, questions or  concerns. No barriers to learning was detected.      [name], MD 10/07/2019   I, Joslyn Devon, am acting as scribe for [name], MD.   I have reviewed the above documentation for accuracy and completeness, and I agree with the above.

## 2019-10-04 ENCOUNTER — Ambulatory Visit
Admission: RE | Admit: 2019-10-04 | Discharge: 2019-10-04 | Disposition: A | Discharge status: Home / Self Care | Payer: PPO | Source: Ambulatory Visit | Attending: Radiation Oncology | Admitting: Radiation Oncology

## 2019-10-04 ENCOUNTER — Other Ambulatory Visit: Payer: Self-pay

## 2019-10-04 DIAGNOSIS — Z510 Encounter for antineoplastic radiation therapy: Secondary | ICD-10-CM | POA: Diagnosis not present

## 2019-10-04 DIAGNOSIS — C155 Malignant neoplasm of lower third of esophagus: Secondary | ICD-10-CM | POA: Diagnosis not present

## 2019-10-04 MED ORDER — SONAFINE EX EMUL
1.0000 "application " | Freq: Once | CUTANEOUS | Status: AC
  Administered 2019-10-04: 1 via TOPICAL

## 2019-10-04 NOTE — Progress Notes (Signed)
Pt here for patient teaching.  Pt given Radiation and You booklet, skin care instructions and Sonafine.  Reviewed areas of pertinence such as fatigue, hair loss, skin changes and throat changes . Pt able to give teach back of to pat skin and use unscented/gentle soap,apply Sonafine bid and avoid applying anything to skin within 4 hours of treatment. Pt verbalizes understanding of information given and will contact nursing with any questions or concerns.     Genoveva Singleton M. Nazier Neyhart RN, BSN     

## 2019-10-07 ENCOUNTER — Ambulatory Visit
Admission: RE | Admit: 2019-10-07 | Discharge: 2019-10-07 | Disposition: A | Discharge status: Home / Self Care | Payer: PPO | Source: Ambulatory Visit | Attending: Radiation Oncology | Admitting: Radiation Oncology

## 2019-10-07 ENCOUNTER — Inpatient Hospital Stay: Payer: PPO | Admitting: Hematology

## 2019-10-07 ENCOUNTER — Inpatient Hospital Stay: Payer: PPO

## 2019-10-07 ENCOUNTER — Encounter: Payer: Self-pay | Admitting: Hematology

## 2019-10-07 ENCOUNTER — Other Ambulatory Visit: Payer: Self-pay

## 2019-10-07 VITALS — BP 119/73 | HR 70 | Temp 98.7°F | Resp 18 | Ht 67.0 in | Wt 173.9 lb

## 2019-10-07 DIAGNOSIS — E119 Type 2 diabetes mellitus without complications: Secondary | ICD-10-CM

## 2019-10-07 DIAGNOSIS — C155 Malignant neoplasm of lower third of esophagus: Secondary | ICD-10-CM

## 2019-10-07 DIAGNOSIS — Z5111 Encounter for antineoplastic chemotherapy: Secondary | ICD-10-CM | POA: Diagnosis not present

## 2019-10-07 DIAGNOSIS — Z510 Encounter for antineoplastic radiation therapy: Secondary | ICD-10-CM | POA: Diagnosis not present

## 2019-10-07 LAB — CEA (IN HOUSE-CHCC): CEA (CHCC-In House): 20.71 ng/mL — ABNORMAL HIGH (ref 0.00–5.00)

## 2019-10-07 LAB — CBC WITH DIFFERENTIAL (CANCER CENTER ONLY)
Abs Immature Granulocytes: 0.04 10*3/uL (ref 0.00–0.07)
Basophils Absolute: 0 10*3/uL (ref 0.0–0.1)
Basophils Relative: 1 %
Eosinophils Absolute: 0.2 10*3/uL (ref 0.0–0.5)
Eosinophils Relative: 5 %
HCT: 34.5 % — ABNORMAL LOW (ref 39.0–52.0)
Hemoglobin: 11.8 g/dL — ABNORMAL LOW (ref 13.0–17.0)
Immature Granulocytes: 1 %
Lymphocytes Relative: 12 %
Lymphs Abs: 0.4 10*3/uL — ABNORMAL LOW (ref 0.7–4.0)
MCH: 30.7 pg (ref 26.0–34.0)
MCHC: 34.2 g/dL (ref 30.0–36.0)
MCV: 89.8 fL (ref 80.0–100.0)
Monocytes Absolute: 0.2 10*3/uL (ref 0.1–1.0)
Monocytes Relative: 5 %
Neutro Abs: 2.6 10*3/uL (ref 1.7–7.7)
Neutrophils Relative %: 76 %
Platelet Count: 248 10*3/uL (ref 150–400)
RBC: 3.84 MIL/uL — ABNORMAL LOW (ref 4.22–5.81)
RDW: 12.1 % (ref 11.5–15.5)
WBC Count: 3.4 10*3/uL — ABNORMAL LOW (ref 4.0–10.5)
nRBC: 0 % (ref 0.0–0.2)

## 2019-10-07 LAB — CMP (CANCER CENTER ONLY)
ALT: 17 U/L (ref 0–44)
AST: 12 U/L — ABNORMAL LOW (ref 15–41)
Albumin: 3.9 g/dL (ref 3.5–5.0)
Alkaline Phosphatase: 46 U/L (ref 38–126)
Anion gap: 9 (ref 5–15)
BUN: 12 mg/dL (ref 8–23)
CO2: 24 mmol/L (ref 22–32)
Calcium: 8.8 mg/dL — ABNORMAL LOW (ref 8.9–10.3)
Chloride: 102 mmol/L (ref 98–111)
Creatinine: 1.12 mg/dL (ref 0.61–1.24)
GFR, Est AFR Am: >60 mL/min (ref >60)
GFR, Estimated: >60 mL/min (ref >60)
Glucose, Bld: 249 mg/dL — ABNORMAL HIGH (ref 70–99)
Potassium: 4.2 mmol/L (ref 3.5–5.1)
Sodium: 135 mmol/L (ref 135–145)
Total Bilirubin: 0.5 mg/dL (ref 0.3–1.2)
Total Protein: 6.9 g/dL (ref 6.5–8.1)

## 2019-10-07 LAB — IRON AND TIBC
Iron: 58 ug/dL (ref 42–163)
Saturation Ratios: 17 % — ABNORMAL LOW (ref 20–55)
TIBC: 343 ug/dL (ref 202–409)
UIBC: 284 ug/dL (ref 117–376)

## 2019-10-07 LAB — FERRITIN: Ferritin: 191 ng/mL (ref 24–336)

## 2019-10-07 MED ORDER — SODIUM CHLORIDE 0.9 % IV SOLN
Freq: Once | INTRAVENOUS | Status: AC
  Filled 2019-10-07: qty 250

## 2019-10-07 MED ORDER — PALONOSETRON HCL INJECTION 0.25 MG/5ML
0.2500 mg | Freq: Once | INTRAVENOUS | Status: AC
  Administered 2019-10-07: 0.25 mg via INTRAVENOUS

## 2019-10-07 MED ORDER — SODIUM CHLORIDE 0.9 % IV SOLN
10.0000 mg | Freq: Once | INTRAVENOUS | Status: AC
  Administered 2019-10-07: 10 mg via INTRAVENOUS
  Filled 2019-10-07: qty 10

## 2019-10-07 MED ORDER — DIPHENHYDRAMINE HCL 50 MG/ML IJ SOLN
INTRAMUSCULAR | Status: AC
  Filled 2019-10-07: qty 1

## 2019-10-07 MED ORDER — DIPHENHYDRAMINE HCL 50 MG/ML IJ SOLN
25.0000 mg | Freq: Once | INTRAMUSCULAR | Status: AC
  Administered 2019-10-07: 25 mg via INTRAVENOUS

## 2019-10-07 MED ORDER — FAMOTIDINE IN NACL 20-0.9 MG/50ML-% IV SOLN
INTRAVENOUS | Status: AC
  Filled 2019-10-07: qty 50

## 2019-10-07 MED ORDER — FAMOTIDINE IN NACL 20-0.9 MG/50ML-% IV SOLN
20.0000 mg | Freq: Once | INTRAVENOUS | Status: AC
  Administered 2019-10-07: 20 mg via INTRAVENOUS

## 2019-10-07 MED ORDER — SODIUM CHLORIDE 0.9 % IV SOLN
50.0000 mg/m2 | Freq: Once | INTRAVENOUS | Status: AC
  Administered 2019-10-07: 96 mg via INTRAVENOUS
  Filled 2019-10-07: qty 16

## 2019-10-07 MED ORDER — SODIUM CHLORIDE 0.9 % IV SOLN
190.0000 mg | Freq: Once | INTRAVENOUS | Status: AC
  Administered 2019-10-07: 190 mg via INTRAVENOUS
  Filled 2019-10-07: qty 19

## 2019-10-07 MED ORDER — PALONOSETRON HCL INJECTION 0.25 MG/5ML
INTRAVENOUS | Status: AC
  Filled 2019-10-07: qty 5

## 2019-10-07 NOTE — Patient Instructions (Signed)
[facility] Discharge Instructions for Patients Receiving Chemotherapy  Today you received the following chemotherapy agents: Taxol, Carboplatin  To help prevent nausea and vomiting after your treatment, we encourage you to take your nausea medication as directed.   If you develop nausea and vomiting that is not controlled by your nausea medication, call the clinic.   BELOW ARE SYMPTOMS THAT SHOULD BE REPORTED IMMEDIATELY:  *FEVER GREATER THAN 100.5 F  *CHILLS WITH OR WITHOUT FEVER  NAUSEA AND VOMITING THAT IS NOT CONTROLLED WITH YOUR NAUSEA MEDICATION  *UNUSUAL SHORTNESS OF BREATH  *UNUSUAL BRUISING OR BLEEDING  TENDERNESS IN MOUTH AND THROAT WITH OR WITHOUT PRESENCE OF ULCERS  *URINARY PROBLEMS  *BOWEL PROBLEMS  UNUSUAL RASH Items with * indicate a potential emergency and should be followed up as soon as possible.  Feel free to call the clinic should you have any questions or concerns. The clinic phone number is [phone].  Please show the CHEMO ALERT CARD at check-in to the Emergency Department and triage nurse.  Paclitaxel injection What is this medicine? PACLITAXEL (PAK li TAX el) is a chemotherapy drug. It targets fast dividing cells, like cancer cells, and causes these cells to die. This medicine is used to treat ovarian cancer, breast cancer, lung cancer, Kaposi's sarcoma, and other cancers. This medicine may be used for other purposes; ask your health care provider or pharmacist if you have questions. COMMON BRAND NAME(S): Onxol, Taxol What should I tell my health care provider before I take this medicine? They need to know if you have any of these conditions:  history of irregular heartbeat  liver disease  low blood counts, like low white cell, platelet, or red cell counts  lung or breathing disease, like asthma  tingling of the fingers or toes, or other nerve disorder  an unusual or allergic reaction to paclitaxel, alcohol,  polyoxyethylated castor oil, other chemotherapy, other medicines, foods, dyes, or preservatives  pregnant or trying to get pregnant  breast-feeding How should I use this medicine? This drug is given as an infusion into a vein. It is administered in a hospital or clinic by a specially trained health care professional. Talk to your pediatrician regarding the use of this medicine in children. Special care may be needed. Overdosage: If you think you have taken too much of this medicine contact a poison control center or emergency room at once. NOTE: This medicine is only for you. Do not share this medicine with others. What if I miss a dose? It is important not to miss your dose. Call your doctor or health care professional if you are unable to keep an appointment. What may interact with this medicine? Do not take this medicine with any of the following medications:  disulfiram  metronidazole This medicine may also interact with the following medications:  antiviral medicines for hepatitis, HIV or AIDS  certain antibiotics like erythromycin and clarithromycin  certain medicines for fungal infections like ketoconazole and itraconazole  certain medicines for seizures like carbamazepine, phenobarbital, phenytoin  gemfibrozil  nefazodone  rifampin  St. John's wort This list may not describe all possible interactions. Give your health care provider a list of all the medicines, herbs, non-prescription drugs, or dietary supplements you use. Also tell them if you smoke, drink alcohol, or use illegal drugs. Some items may interact with your medicine. What should I watch for while using this medicine? Your condition will be monitored carefully while you are receiving this medicine. You will need important blood   work done while you are taking this medicine. This medicine can cause serious allergic reactions. To reduce your risk you will need to take other medicine(s) before treatment with this  medicine. If you experience allergic reactions like skin rash, itching or hives, swelling of the face, lips, or tongue, tell your doctor or health care professional right away. In some cases, you may be given additional medicines to help with side effects. Follow all directions for their use. This drug may make you feel generally unwell. This is not uncommon, as chemotherapy can affect healthy cells as well as cancer cells. Report any side effects. Continue your course of treatment even though you feel ill unless your doctor tells you to stop. Call your doctor or health care professional for advice if you get a fever, chills or sore throat, or other symptoms of a cold or flu. Do not treat yourself. This drug decreases your body's ability to fight infections. Try to avoid being around people who are sick. This medicine may increase your risk to bruise or bleed. Call your doctor or health care professional if you notice any unusual bleeding. Be careful brushing and flossing your teeth or using a toothpick because you may get an infection or bleed more easily. If you have any dental work done, tell your dentist you are receiving this medicine. Avoid taking products that contain aspirin, acetaminophen, ibuprofen, naproxen, or ketoprofen unless instructed by your doctor. These medicines may hide a fever. Do not become pregnant while taking this medicine. Women should inform their doctor if they wish to become pregnant or think they might be pregnant. There is a potential for serious side effects to an unborn child. Talk to your health care professional or pharmacist for more information. Do not breast-feed an infant while taking this medicine. Men are advised not to father a child while receiving this medicine. This product may contain alcohol. Ask your pharmacist or healthcare provider if this medicine contains alcohol. Be sure to tell all healthcare providers you are taking this medicine. Certain medicines,  like metronidazole and disulfiram, can cause an unpleasant reaction when taken with alcohol. The reaction includes flushing, headache, nausea, vomiting, sweating, and increased thirst. The reaction can last from 30 minutes to several hours. What side effects may I notice from receiving this medicine? Side effects that you should report to your doctor or health care professional as soon as possible:  allergic reactions like skin rash, itching or hives, swelling of the face, lips, or tongue  breathing problems  changes in vision  fast, irregular heartbeat  high or low blood pressure  mouth sores  pain, tingling, numbness in the hands or feet  signs of decreased platelets or bleeding - bruising, pinpoint red spots on the skin, black, tarry stools, blood in the urine  signs of decreased red blood cells - unusually weak or tired, feeling faint or lightheaded, falls  signs of infection - fever or chills, cough, sore throat, pain or difficulty passing urine  signs and symptoms of liver injury like dark yellow or brown urine; general ill feeling or flu-like symptoms; light-colored stools; loss of appetite; nausea; right upper belly pain; unusually weak or tired; yellowing of the eyes or skin  swelling of the ankles, feet, hands  unusually slow heartbeat Side effects that usually do not require medical attention (report to your doctor or health care professional if they continue or are bothersome):  diarrhea  hair loss  loss of appetite  muscle or joint pain    nausea, vomiting  pain, redness, or irritation at site where injected  tiredness This list may not describe all possible side effects. Call your doctor for medical advice about side effects. You may report side effects to FDA at 1-800-FDA-1088. Where should I keep my medicine? This drug is given in a hospital or clinic and will not be stored at home. NOTE: This sheet is a summary. It may not cover all possible information.  If you have questions about this medicine, talk to your doctor, pharmacist, or health care provider.  2020 [employer] Standard (2017-02-07 13:14:55)  Carboplatin injection What is this medicine? CARBOPLATIN (KAR boe pla tin) is a chemotherapy drug. It targets fast dividing cells, like cancer cells, and causes these cells to die. This medicine is used to treat ovarian cancer and many other cancers. This medicine may be used for other purposes; ask your health care provider or pharmacist if you have questions. COMMON BRAND NAME(S): Paraplatin What should I tell my health care provider before I take this medicine? They need to know if you have any of these conditions:  blood disorders  hearing problems  kidney disease  recent or ongoing radiation therapy  an unusual or allergic reaction to carboplatin, cisplatin, other chemotherapy, other medicines, foods, dyes, or preservatives  pregnant or trying to get pregnant  breast-feeding How should I use this medicine? This drug is usually given as an infusion into a vein. It is administered in a hospital or clinic by a specially trained health care professional. Talk to your pediatrician regarding the use of this medicine in children. Special care may be needed. Overdosage: If you think you have taken too much of this medicine contact a poison control center or emergency room at once. NOTE: This medicine is only for you. Do not share this medicine with others. What if I miss a dose? It is important not to miss a dose. Call your doctor or health care professional if you are unable to keep an appointment. What may interact with this medicine?  medicines for seizures  medicines to increase blood counts like filgrastim, pegfilgrastim, sargramostim  some antibiotics like amikacin, gentamicin, neomycin, streptomycin, tobramycin  vaccines Talk to your doctor or health care professional before taking any of these  medicines:  acetaminophen  aspirin  ibuprofen  ketoprofen  naproxen This list may not describe all possible interactions. Give your health care provider a list of all the medicines, herbs, non-prescription drugs, or dietary supplements you use. Also tell them if you smoke, drink alcohol, or use illegal drugs. Some items may interact with your medicine. What should I watch for while using this medicine? Your condition will be monitored carefully while you are receiving this medicine. You will need important blood work done while you are taking this medicine. This drug may make you feel generally unwell. This is not uncommon, as chemotherapy can affect healthy cells as well as cancer cells. Report any side effects. Continue your course of treatment even though you feel ill unless your doctor tells you to stop. In some cases, you may be given additional medicines to help with side effects. Follow all directions for their use. Call your doctor or health care professional for advice if you get a fever, chills or sore throat, or other symptoms of a cold or flu. Do not treat yourself. This drug decreases your body's ability to fight infections. Try to avoid being around people who are sick. This medicine may increase your risk to bruise or bleed.   Call your doctor or health care professional if you notice any unusual bleeding. Be careful brushing and flossing your teeth or using a toothpick because you may get an infection or bleed more easily. If you have any dental work done, tell your dentist you are receiving this medicine. Avoid taking products that contain aspirin, acetaminophen, ibuprofen, naproxen, or ketoprofen unless instructed by your doctor. These medicines may hide a fever. Do not become pregnant while taking this medicine. Women should inform their doctor if they wish to become pregnant or think they might be pregnant. There is a potential for serious side effects to an unborn child. Talk  to your health care professional or pharmacist for more information. Do not breast-feed an infant while taking this medicine. What side effects may I notice from receiving this medicine? Side effects that you should report to your doctor or health care professional as soon as possible:  allergic reactions like skin rash, itching or hives, swelling of the face, lips, or tongue  signs of infection - fever or chills, cough, sore throat, pain or difficulty passing urine  signs of decreased platelets or bleeding - bruising, pinpoint red spots on the skin, black, tarry stools, nosebleeds  signs of decreased red blood cells - unusually weak or tired, fainting spells, lightheadedness  breathing problems  changes in hearing  changes in vision  chest pain  high blood pressure  low blood counts - This drug may decrease the number of white blood cells, red blood cells and platelets. You may be at increased risk for infections and bleeding.  nausea and vomiting  pain, swelling, redness or irritation at the injection site  pain, tingling, numbness in the hands or feet  problems with balance, talking, walking  trouble passing urine or change in the amount of urine Side effects that usually do not require medical attention (report to your doctor or health care professional if they continue or are bothersome):  hair loss  loss of appetite  metallic taste in the mouth or changes in taste This list may not describe all possible side effects. Call your doctor for medical advice about side effects. You may report side effects to FDA at 1-800-FDA-1088. Where should I keep my medicine? This drug is given in a hospital or clinic and will not be stored at home. NOTE: This sheet is a summary. It may not cover all possible information. If you have questions about this medicine, talk to your doctor, pharmacist, or health care provider.  2020 [employer]/Gold Standard (2007-09-11 14:38:05)  

## 2019-10-08 ENCOUNTER — Encounter: Payer: PPO | Admitting: Cardiothoracic Surgery

## 2019-10-08 ENCOUNTER — Inpatient Hospital Stay: Payer: PPO | Admitting: Nutrition

## 2019-10-08 ENCOUNTER — Other Ambulatory Visit: Payer: Self-pay

## 2019-10-08 ENCOUNTER — Ambulatory Visit
Admission: RE | Admit: 2019-10-08 | Discharge: 2019-10-08 | Disposition: A | Discharge status: Home / Self Care | Payer: PPO | Source: Ambulatory Visit | Attending: Radiation Oncology | Admitting: Radiation Oncology

## 2019-10-08 ENCOUNTER — Telehealth: Payer: Self-pay | Admitting: Hematology

## 2019-10-08 DIAGNOSIS — C155 Malignant neoplasm of lower third of esophagus: Secondary | ICD-10-CM | POA: Diagnosis not present

## 2019-10-08 DIAGNOSIS — Z510 Encounter for antineoplastic radiation therapy: Secondary | ICD-10-CM | POA: Diagnosis not present

## 2019-10-08 NOTE — Progress Notes (Signed)
[address]       [place] [address]             [phone]                    James Dunn [facility] Medical Record [id number] Date of Birth: 06/06/1951  Referring: [name], MD Primary Care: Denita Lung, MD Primary Cardiologist: No primary care provider on file.  Chief Complaint:    Chief Complaint  Patient presents with  . Esophageal Cancer    Surgical Eval, Pet Scan 09/20/19, Chest/ABD/Pelvis CT 09/05/19, EUS  09/13/19, Upper GI endoscopy 08/30/19     History of Present Illness:    James Dunn 69 y.o. 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  The patient noted 2 to 3 months ago food hanging up especially meat when he tried to swallow up.  He also noted hiccuping with eating.  He had approximately 10 pound weight loss over the past 2 months from 185-174.  With these symptoms he underwent barium swallow upper endoscopy and was noted to have a distal esophageal mass circumferential.  Biopsy confirmed adenocarcinoma.  Esophageal ultrasound was done staged T3N3 .    The patient has been started on weekly chemotherapy and daily radiation therapy starting April 14.  The patient is a previous heavy smoker for more than 40 years, but notes that he quit in October 2020.  He has no prior history of hypertension myocardial infarction angina denies hyperlipidemia previous stroke peripheral vascular disease or renal vascular disease, he does note oral agent controlled diabetes./  Cancer Staging Esophageal cancer [facility]) Staging form: Esophagus - Adenocarcinoma, AJCC 8th Edition - Clinical stage from 09/13/2019: Stage IVA (cT3, cN2, cM0) - Signed by [name], MD on 09/22/2019  SURGICAL PATHOLOGY  CASE: [phone]  PATIENT: James Dunn  Surgical Pathology Report  Clinical History: Dysphagia / abnormal barium swallow-evaluate for  malignancy (cm)  FINAL MICROSCOPIC DIAGNOSIS:  A. ESOPHAGUS, BIOPSY:  - At least  intramucosal adenocarcinoma.  - See comment.  COMMENT:  - The depth of invasion can not be determined due to the superficial  nature of the biopsy. Dr. Vic Ripper has reviewed the case and concurs  with this interpretation. Additional studies can be performed upon  clinician request. Dr. [name] was paged on 09/03/2019.   Final Diagnosis performed by Enid Cutter, MD.  Electronically signed  09/03/2019  Current Activity/ Functional Status:  Patient is independent with mobility/ambulation, transfers, ADL's, IADL's.   Zubrod Score: At the time of surgery this patient's most appropriate activity status/level should be described as: []     0    Normal activity, no symptoms [x]     1    Restricted in physical strenuous activity but ambulatory, able to do out light work []     2    Ambulatory and capable of self care, unable to do work activities, up and about               >50 % of waking hours                              []     3    Only limited self care, in bed greater than 50% of waking hours []     4    Completely disabled, no self care, confined to bed or chair []   5    Moribund   Past Medical History:  Diagnosis Date  . Allergy   . Asthma   . Diabetes mellitus without complication ([place])   . Diverticulosis   . GERD (gastroesophageal reflux disease) 2002  . Health maintenance examination     Past Surgical History:  Procedure Laterality Date  . BIOPSY  08/30/2019   Procedure: BIOPSY;  Surgeon: [name], MD;  Location: [facility];  Service: Endoscopy;;  . ESOPHAGOGASTRODUODENOSCOPY (EGD) WITH PROPOFOL N/A 08/30/2019   Procedure: ESOPHAGOGASTRODUODENOSCOPY (EGD) WITH PROPOFOL;  Surgeon: [name], MD;  Location: [facility];  Service: Endoscopy;  Laterality: N/A;  . ESOPHAGOGASTRODUODENOSCOPY (EGD) WITH PROPOFOL N/A 09/13/2019   Procedure: ESOPHAGOGASTRODUODENOSCOPY (EGD) WITH PROPOFOL;  Surgeon: [name], MD;  Location: [facility];  Service: Endoscopy;  Laterality:  N/A;  . SKIN BIOPSY N/A 03/04/2019   upper back Nerofibroma and follicular cyst   . SUBMUCOSAL INJECTION  08/30/2019   Procedure: SUBMUCOSAL INJECTION;  Surgeon: [name], MD;  Location: [facility];  Service: Endoscopy;;  . UPPER ESOPHAGEAL ENDOSCOPIC ULTRASOUND (EUS) N/A 09/13/2019   Procedure: UPPER ESOPHAGEAL ENDOSCOPIC ULTRASOUND (EUS);  Surgeon: [name], MD;  Location: [facility];  Service: Endoscopy;  Laterality: N/A;    No family history on file.   Social History   Tobacco Use  Smoking Status Former Smoker  . Types: Cigarettes  . Quit date: 03/21/2019  . Years since quitting: 0.5  Smokeless Tobacco Never Used    Social History   Substance and Sexual Activity  Alcohol Use Not Currently  . Alcohol/week: 7.0 standard drinks  . Types: 7 Cans of beer per week     No Known Allergies  Current Outpatient Medications  Medication Sig Dispense Refill  . albuterol (VENTOLIN HFA) 108 (90 Base) MCG/ACT inhaler TAKE 2 PUFFS BY MOUTH EVERY 6 HOURS AS NEEDED FOR WHEEZE 18 g 0  . atorvastatin (LIPITOR) 10 MG tablet Take 10 mg by mouth daily.    Marland Kitchen lisinopril (ZESTRIL) 5 MG tablet Take 5 mg by mouth daily.    . metFORMIN (GLUCOPHAGE) 500 MG tablet Take 1 tablet (500 mg total) by mouth daily with breakfast. 30 tablet 2  . prochlorperazine (COMPAZINE) 10 MG tablet Take 1 tablet (10 mg total) by mouth every 6 (six) hours as needed (Nausea or vomiting). 30 tablet 1   No current facility-administered medications for this visit.    Pertinent items are noted in HPI.   Review of Systems:     Cardiac Review of Systems: [Y] = yes  or   [ N ] = no   Chest Pain [  n  ]  Resting SOB [  n ] Exertional SOB  n[  ]  Orthopnea [n  ]   Pedal Edema [n   ]    Palpitations Florencio.Farrier  ] Syncope  [  n]   Presyncope [ n ]   General Review of Systems: [Y] = yes [  ]=no Constitional: recent weight change [  ];  Wt loss over the last 3 months [   ] anorexia [  ]; fatigue [  ]; nausea [  ]; night  sweats [  ]; fever [  ]; or chills [  ];           Eye : blurred vision [  ]; diplopia [   ]; vision changes [  ];  Amaurosis fugax[  ]; Resp: cough [  ];  wheezing[  ];  hemoptysis[  ]; shortness of  breath[  ]; paroxysmal nocturnal dyspnea[  ]; dyspnea on exertion[  ]; or orthopnea[  ];  GI:  gallstones[  ], vomiting[y  ];  dysphagia[ y ]; melena[  ];  hematochezia [  ]; heartburn[ y ];   Hx of  Colonoscopy[  ]; GU: kidney stones [  ]; hematuria[  ];   dysuria [  ];  nocturia[  ];  history of     obstruction [  ]; urinary frequency [  ]             Skin: rash, swelling[  ];, hair loss[  ];  peripheral edema[  ];  or itching[  ]; Musculosketetal: myalgias[  ];  joint swelling[  ];  joint erythema[  ];  joint pain[  ];  back pain[  ];  Heme/Lymph: bruising[  ];  bleeding[  ];  anemia[  ];  Neuro: TIA[  ];  headaches[  ];  stroke[  ];  vertigo[  ];  seizures[  ];   paresthesias[  ];  difficulty walking[  ];  Psych:depression[  ]; anxiety[  ];  Endocrine: diabetes[  ];  thyroid dysfunction[  ];  Immunizations: Flu up to date Blue.Reese  ]; Pneumococcal up to date [ n ];  Other:     PHYSICAL EXAMINATION: BP 126/69   Pulse 86   Temp 98.2 F (36.8 C) (Skin)   Resp 20   Ht 5\' 7"  (1.702 m)   Wt 173 lb (78.5 kg)   SpO2 98% Comment: RA  BMI 27.10 kg/m  General appearance: alert, cooperative and no distress Head: Normocephalic, without obvious abnormality, atraumatic Neck: no adenopathy, no carotid bruit, no JVD, supple, symmetrical, trachea midline and thyroid not enlarged, symmetric, no tenderness/mass/nodules Lymph nodes: Cervical, supraclavicular, and axillary nodes normal. Resp: clear to auscultation bilaterally Cardio: regular rate and rhythm, S1, S2 normal, no murmur, click, rub or gallop GI: soft, non-tender; bowel sounds normal; no masses,  no organomegaly Extremities: extremities normal, atraumatic, no cyanosis or edema and Homans sign is negative, no sign of DVT Neurologic: Grossly  normal  Diagnostic Studies & Laboratory data:     Recent Radiology Findings:   CLINICAL DATA:  New diagnosis of esophageal cancer.  EXAM: CT CHEST, ABDOMEN, AND PELVIS WITH CONTRAST  TECHNIQUE: Multidetector CT imaging of the chest, abdomen and pelvis was performed following the standard protocol during bolus administration of intravenous contrast.  CONTRAST:  157mL ISOVUE-300 IOPAMIDOL (ISOVUE-300) INJECTION 61%  COMPARISON:  Barium esophagram 08/22/2019.  FINDINGS: CT CHEST FINDINGS  Cardiovascular: The heart size is normal. No substantial pericardial effusion. Coronary artery calcification is evident. Atherosclerotic calcification is noted in the wall of the thoracic aorta. Ascending thoracic aorta is 4 cm diameter.  Mediastinum/Nodes: No mediastinal lymphadenopathy. 6 mm short axis lower paraesophageal node is visible on image 48/series 2. Soft tissue mass noted distal esophagus, compatible with known neoplasm. No axillary lymphadenopathy. There is no hilar lymphadenopathy.  Lungs/Pleura: Centrilobular and paraseptal emphysema evident. 6 mm perifissural nodule noted right middle lobe on image 100/series 4, likely a subpleural lymph node. Tiny calcified granuloma noted left lower lobe on 140/4. A cluster of granulomata in scarring in the lateral left lower lobe is compatible with prior granulomatous involvement.  Musculoskeletal: No worrisome lytic or sclerotic osseous abnormality.  CT ABDOMEN PELVIS FINDINGS  Hepatobiliary: The liver shows diffusely decreased attenuation suggesting fat deposition. Liver measures 18.2 cm craniocaudal length, enlarged. No suspicious focal lesion within the hepatic parenchyma. Gallbladder is nondistended. No intrahepatic or  extrahepatic biliary dilation.  Pancreas: No focal mass lesion. No dilatation of the main duct. No intraparenchymal cyst. No peripancreatic edema.  1.9 x 1.3 cm nodular collection of soft tissues  identified posterior to the descending duodenum (see image 75/series 2. This is in close proximity to the pancreatic head but a definite communication of parenchyma between these 2 structures is not discernible by CT. 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  Spleen: No splenomegaly. No focal mass lesion.  Adrenals/Urinary Tract: No adrenal nodule or mass. 7 mm hypoattenuating lesion interpolar right kidney is too small to characterize but is most likely benign. Similar 4 mm cortical hypointensity in the lower pole left kidney cannot be characterized but is likely benign. No evidence for hydroureter. The urinary bladder appears normal for the degree of distention.  Stomach/Bowel: Stomach is unremarkable. No gastric wall thickening. No evidence of outlet obstruction. Duodenum is normally positioned as is the ligament of Treitz. No small bowel wall thickening. No small bowel dilatation. The terminal ileum is normal. The appendix is normal. Diverticular changes are noted in the left colon without evidence of diverticulitis.  Vascular/Lymphatic: There is abdominal aortic atherosclerosis without aneurysm. Left-sided IVC evident, normal variant. 15 mm short axis hepatoduodenal ligament lymph node is mildly enlarged (image 70/2. No other mediastinal lymphadenopathy although upper normal right para-aortic nodes are visible on images 73 and 75 of series 2, measuring up to 12 mm short axis. No pelvic sidewall lymphadenopathy.  Reproductive: The prostate gland and seminal vesicles are unremarkable.  Other: No intraperitoneal free fluid.  Musculoskeletal: Fatty lesion in the inferior aspect of the iliopsoas complex is compatible with lipoma. No worrisome lytic or sclerotic osseous abnormality.  IMPRESSION: 1. Soft tissue mass noted distal esophagus, compatible with  known neoplasm. There is a small 6 mm short axis adjacent paraesophageal node, concerning for metastatic disease. 2. Upper normal to mildly enlarged hepatoduodenal ligament lymph node, with upper normal right para-aortic nodes. As metastatic disease cannot be excluded, PET-CT may prove helpful to further evaluate. 3. 1.9 x 1.3 cm nodular collection of soft tissue posterior to the descending duodenum. This is in close proximity to the pancreatic head but a definite communication of parenchyma between these 2 structures is not discernible by CT. 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 Attention on follow-up recommended. 4. 6 mm perifissural nodule right middle lobe, likely a subpleural lymph node. Attention on follow-up recommended. 5. Hepatomegaly with hepatic steatosis. 6. Ascending thoracic aorta measures 4 cm diameter. Recommend annual imaging followup by CTA or MRA. This recommendation follows 2010 ACCF/AHA/AATS/ACR/ASA/SCA/SCAI/SIR/STS/SVM Guidelines for the Diagnosis and Management of Patients with Thoracic Aortic Disease. Circulation. 2010; [id number]. Aortic aneurysm NOS (ICD10-I71.9) 7. Left-sided IVC, normal variant.   Electronically Signed   By: Misty Stanley M.D.   On: 09/05/2019 10:38   NM PET Image Initial (PI) Skull Base To Thigh  Result Date: 09/20/2019 CLINICAL DATA:  Initial treatment strategy for malignant neoplasm of distal third of esophagus. COVID vaccine in right arm on 08/19/2019 EXAM: NUCLEAR MEDICINE PET SKULL BASE TO THIGH TECHNIQUE: 8.8 mCi F-18 FDG was injected intravenously. Full-ring PET imaging was performed from the skull base to thigh after the radiotracer. CT data was obtained and used for attenuation correction and anatomic localization. Fasting blood glucose: 134 mg/dl COMPARISON:  Esophagram of 08/22/2019. Chest abdomen and pelvic CTs of 09/05/2019. FINDINGS: Mediastinal blood pool activity: SUV  max 2.8 Liver  activity: SUV max NA NECK: No areas of abnormal hypermetabolism. Incidental CT findings: Bilateral carotid atherosclerosis. No cervical adenopathy. Mucosal thickening of both maxillary sinuses. Fluid in both mastoid air cells. CHEST: Low-level hypermetabolism within right axillary nodes, including index node of 1.2 cm and a S.U.V. max of 3.0, likely reactive and related to recent COVID-19 vaccine. The distal esophageal primary is markedly hypermetabolic. Example at a S.U.V. max of 25.8, including on 103/4. The adjacent periesophageal node described on CT is not hypermetabolic. No pulmonary parenchymal hypermetabolism. Incidental CT findings: Centrilobular and paraseptal emphysema. Aortic and coronary artery atherosclerosis. Borderline ascending aortic aneurysm, as on diagnostic CT. ABDOMEN/PELVIS: Multiple upper abdominal nodes demonstrate low-level, nonspecific hypermetabolism. An index retrocaval node measures 8 mm and a S.U.V. max of 2.7 on 125/4. A portal caval node measures 1.2 cm and a S.U.V. max of 3.3 on 128/4. Multifocal colonic hypermetabolism is primarily felt to be physiologic. There is focal anal hypermetabolism, slightly eccentric left, which measures a S.U.V. max of 11.9, including on 195/4. Incidental CT findings: Deferred to prior diagnostic CT. Hepatic steatosis and hepatomegaly. Left IVC. Aortic atherosclerosis. Extensive colonic diverticulosis. Lipoma within the right thigh musculature. SKELETON: No abnormal marrow activity. Incidental CT findings: none IMPRESSION: 1. Hypermetabolic distal esophageal primary. 2. Low-level hypermetabolism within upper abdominal nodes. These are technically nonspecific, but favored to be reactive in the setting of hepatic steatosis and hepatomegaly. 3. Otherwise, no evidence of metastatic disease. 4. Anal hypermetabolism could be physiologic. Consider correlation with physical exam and colonoscopy, if patient is not up-to-date. 5. Incidental findings, including:  Aortic atherosclerosis (ICD10-I70.0), coronary artery atherosclerosis and emphysema (ICD10-J43.9). Sinus disease and bilateral mastoid effusions. Electronically Signed   By: Abigail Miyamoto M.D.   On: 09/20/2019 09:26     I have independently reviewed the above radiology studies  and reviewed the findings with the patient.   Recent Lab Findings: Lab Results  Component Value Date   WBC 3.4 (L) 10/07/2019   HGB 11.8 (L) 10/07/2019   HCT 34.5 (L) 10/07/2019   PLT 248 10/07/2019   GLUCOSE 249 (H) 10/07/2019   CHOL 146 11/07/2018   TRIG 202 (H) 11/07/2018   HDL 35 (L) 11/07/2018   LDLCALC 71 11/07/2018   ALT 17 10/07/2019   AST 12 (L) 10/07/2019   NA 135 10/07/2019   K 4.2 10/07/2019   CL 102 10/07/2019   CREATININE 1.12 10/07/2019   BUN 12 10/07/2019   CO2 24 10/07/2019   HGBA1C 6.7 (A) 07/08/2019   ENDOSONOGRAPHIC FINDING: Findings: A hypoechoic mass was found in the lower third of the esophagus. The mass was encountered at 36 cm from the incisors and extended to 40 cm. The lesion was circumferential. The endosonographic borders were irregular. The mass measured up to 26 mm in thickness. There was sonographic evidence suggesting invasion into the adventitia (Layer 5). The large friable distal esophageal adenocarcinoma again identified. With gentle pressure and maneuvering, both the linear and radial echoendoscopes were able to pass through the area with very little resistance. The mass was circumfirential on EUS, but there was eccentric growth. The lesion extended beyond the muscularis propria and to the adventitia (images 12 and 13). At least three distinct peritumoral lymph nodes were found measuring 7-12 mm. No other supicious lesions were found in the upper GI tract. The heterotopic pancreatic tissue noted on the CT scan appeared to be continguous with the entire pancreas. There was lobularity in this area. - A mass was found in  the lower third of the esophagus. A tissue  diagnosis was obtained prior to this exam. This is of adenocarcinoma. This was staged T3 N3 Mx by endosonographic criteria. - No specimens collected.   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  At that time we will arrange for restaging scans.  Patient was given written information concerning esophageal cancer and esophagectomy.  Depending on rescanning/staging consider surgical resection 6 to 8 weeks after the completion of radiation. #2 history of long-term tobacco use-currently not smoking since October 2020 #3 oral agent controlled diabetes #4 incidental finding of mildly dilated ascending aorta 4 cm in diameter        [name] MD      [address] [place] [address] Office [phone]     10/10/2019 8:47 AM

## 2019-10-08 NOTE — Progress Notes (Signed)
Pharmacist Chemotherapy Monitoring - Follow Up Assessment    I verify that I have reviewed each item in the below checklist:  . Regimen for the patient is scheduled for the appropriate day and plan matches scheduled date. Marland Kitchen Appropriate non-routine labs are ordered dependent on drug ordered. . If applicable, additional medications reviewed and ordered per protocol based on lifetime cumulative doses and/or treatment regimen.   Plan for follow-up and/or issues identified: No . I-vent associated with next due treatment: No . MD and/or nursing notified: No  Philomena Course 10/08/2019 3:13 PM

## 2019-10-08 NOTE — Telephone Encounter (Signed)
No 4/19 los. No changes made to pt's schedule.  

## 2019-10-08 NOTE — Progress Notes (Signed)
69 year old male diagnosed with esophageal cancer receiving concurrent chemoradiation therapy.  Past medical history includes diabetes, diverticulitis, and GERD.  Medications include Lipitor, Glucophage, and Compazine.  Labs include glucose 208.  Height: 5 feet 7 inches. Weight: 173.9 pounds. Usual body weight: 184 pounds January 2021. BMI: 27.24.  Spoke with patient over the phone. He is eating what he wants but does experience some dysphagia with tough meats like steak and pork. He is trying to drink Glucerna. Reports he pretty much eats what he wants and is not restricting his diet.  If he wants dessert he eats dessert. Endorses higher CBGs.  Nutrition diagnosis: Unintended weight loss related to esophageal cancer as evidenced by 5% weight loss from usual body weight.  Intervention: Educated patient on the importance of consuming adequate calories and protein to minimize further weight loss. Encourage no concentrated sweets.  Explained that goal is not to limit enjoyable foods but to pare proteins and carbohydrates for slower breakdown of glucose in the bloodstream. Will mail coupons for Glucerna to patient along with contact information. He prefers no nutrition follow-ups at this time.  Monitoring, evaluation, goals: Patient will consume adequate calories for weight maintenance.  Next visit to be scheduled by patient.  He declines follow-up.  He has my contact information.  **Disclaimer: This note was dictated with voice recognition software. Similar sounding words can inadvertently be transcribed and this note may contain transcription errors which may not have been corrected upon publication of note.**

## 2019-10-09 ENCOUNTER — Ambulatory Visit
Admission: RE | Admit: 2019-10-09 | Discharge: 2019-10-09 | Disposition: A | Discharge status: Home / Self Care | Payer: PPO | Source: Ambulatory Visit | Attending: Radiation Oncology | Admitting: Radiation Oncology

## 2019-10-09 ENCOUNTER — Other Ambulatory Visit: Payer: Self-pay

## 2019-10-09 ENCOUNTER — Institutional Professional Consult (permissible substitution): Payer: PPO | Admitting: Cardiothoracic Surgery

## 2019-10-09 VITALS — BP 126/69 | HR 86 | Temp 98.2°F | Resp 20 | Ht 67.0 in | Wt 173.0 lb

## 2019-10-09 DIAGNOSIS — I7121 Aneurysm of the ascending aorta, without rupture: Secondary | ICD-10-CM

## 2019-10-09 DIAGNOSIS — C155 Malignant neoplasm of lower third of esophagus: Secondary | ICD-10-CM | POA: Diagnosis not present

## 2019-10-09 DIAGNOSIS — Z510 Encounter for antineoplastic radiation therapy: Secondary | ICD-10-CM | POA: Diagnosis not present

## 2019-10-09 DIAGNOSIS — E119 Type 2 diabetes mellitus without complications: Secondary | ICD-10-CM

## 2019-10-09 DIAGNOSIS — I712 Thoracic aortic aneurysm, without rupture: Secondary | ICD-10-CM | POA: Diagnosis not present

## 2019-10-10 ENCOUNTER — Ambulatory Visit
Admission: RE | Admit: 2019-10-10 | Discharge: 2019-10-10 | Disposition: A | Discharge status: Home / Self Care | Payer: PPO | Source: Ambulatory Visit | Attending: Radiation Oncology | Admitting: Radiation Oncology

## 2019-10-10 ENCOUNTER — Other Ambulatory Visit: Payer: Self-pay

## 2019-10-10 DIAGNOSIS — C155 Malignant neoplasm of lower third of esophagus: Secondary | ICD-10-CM | POA: Diagnosis not present

## 2019-10-10 DIAGNOSIS — Z510 Encounter for antineoplastic radiation therapy: Secondary | ICD-10-CM | POA: Diagnosis not present

## 2019-10-10 NOTE — Progress Notes (Signed)
Patient calls stating he needs the images of his PET scan put on a CD to take to Dr. Everrett Coombe office. I spoke with Tedra Coupe in Radiology she will burn the CD and he can pick it up in the morning when he comes in for RT, the patient is aware.

## 2019-10-11 ENCOUNTER — Ambulatory Visit
Admission: RE | Admit: 2019-10-11 | Discharge: 2019-10-11 | Disposition: A | Discharge status: Home / Self Care | Payer: PPO | Source: Ambulatory Visit | Attending: Radiation Oncology | Admitting: Radiation Oncology

## 2019-10-11 ENCOUNTER — Other Ambulatory Visit: Payer: Self-pay

## 2019-10-11 DIAGNOSIS — C155 Malignant neoplasm of lower third of esophagus: Secondary | ICD-10-CM | POA: Diagnosis not present

## 2019-10-11 DIAGNOSIS — Z510 Encounter for antineoplastic radiation therapy: Secondary | ICD-10-CM | POA: Diagnosis not present

## 2019-10-11 MED FILL — Dexamethasone Sodium Phosphate Inj 100 MG/10ML: INTRAMUSCULAR | Qty: 1 | Status: AC

## 2019-10-13 NOTE — Progress Notes (Signed)
[place]   [phone]) [phone] Fax:(336) [phone]   Clinic Follow up Note   Patient Care Team: Denita Lung, MD as PCP - General (Family Medicine) [name], MD as Consulting Physician (Gastroenterology) [name], MD as Consulting Physician (Hematology) Jonnie Finner, RN as Oncology Nurse Navigator 10/14/2019  CHIEF COMPLAINT: F/u esophagus cancer   SUMMARY OF ONCOLOGIC HISTORY: Oncology History  Esophageal cancer ([place])  08/30/2019 Procedure   EGD by Dr. [name] 08/30/19  IMPRESSION - Partially obstructing, malignant esophageal tumor was found in the lower third of the esophagus. Biopsied. Injected. - Normal stomach. - Normal examined duodenum.   08/30/2019 Initial Biopsy   FINAL MICROSCOPIC DIAGNOSIS:   A. ESOPHAGUS, BIOPSY:  - At least intramucosal adenocarcinoma.  - See comment.   COMMENT:   - The depth of invasion can not be determined due to the superficial  nature of the biopsy.  Dr.  Vic Ripper has reviewed the case and concurs  with this interpretation.  Additional studies can be performed upon  clinician request.    09/05/2019 Imaging   CT CAP W Contrast 09/05/19  IMPRESSION: 1. Soft tissue mass noted distal esophagus, compatible with known neoplasm. There is a small 6 mm short axis adjacent paraesophageal node, concerning for metastatic disease. 2. Upper normal to mildly enlarged hepatoduodenal ligament lymph node, with upper normal right para-aortic nodes. As metastatic disease cannot be excluded, PET-CT may prove helpful to further evaluate. 3. 1.9 x 1.3 cm nodular collection of soft tissue posterior to the descending duodenum. This is in close proximity to the pancreatic head but a definite communication of parenchyma between these 2 structures is not discernible by CT. 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 Attention on follow-up recommended. 4. 6 mm perifissural nodule right middle lobe,  likely a subpleural lymph node. Attention on follow-up recommended. 5. Hepatomegaly with hepatic steatosis. 6. Ascending thoracic aorta measures 4 cm diameter. Recommend annual imaging followup by CTA or MRA. This recommendation follows 2010 ACCF/AHA/AATS/ACR/ASA/SCA/SCAI/SIR/STS/SVM Guidelines for the Diagnosis and Management of Patients with Thoracic Aortic Disease. Circulation. 2010; [id number]. Aortic aneurysm NOS (ICD10-I71.9) 7. Left-sided IVC, normal variant.   09/11/2019 Tumor Marker   Baseline CEA 25.12 on 09/11/19   09/13/2019 Cancer Staging   Staging form: Esophagus - Adenocarcinoma, AJCC 8th Edition - Clinical stage from 09/13/2019: Stage IVA (cT3, cN2, cM0) - Signed by [name], MD on 09/22/2019   09/13/2019 Procedure   EUS by Dr. [name] 09/13/19  IMPRESSION - A mass was found in the lower third of the esophagus. A tissue diagnosis was obtained prior to this exam. This is of adenocarcinoma. This was staged T3 N3 Mx by endosonographic criteria. - No specimens collected.   09/20/2019 PET scan   PET IMPRESSION: 1. Hypermetabolic distal esophageal primary. 2. Low-level hypermetabolism within upper abdominal nodes. These are technically nonspecific, but favored to be reactive in the setting of hepatic steatosis and hepatomegaly. 3. Otherwise, no evidence of metastatic disease. 4. Anal hypermetabolism could be physiologic. Consider correlation with physical exam and colonoscopy, if patient is not up-to-date. 5. Incidental findings, including: Aortic atherosclerosis (ICD10-I70.0), coronary artery atherosclerosis and emphysema (ICD10-J43.9). Sinus disease and bilateral mastoid effusions.   09/22/2019 Initial Diagnosis   Esophageal cancer ([place])   09/30/2019 -  Chemotherapy   concurrent ChemoRT with weekly Carboplatin and Taxol starting 4/12   09/30/2019 -  Radiation Therapy   concurrent ChemoRT with Dr. Lisbeth Renshaw starting 4/12     CURRENT THERAPY: concurrent chemoRT  with weekly  CT for 6 weeks starting 09/30/19  INTERVAL HISTORY: James Dunn returns for f/u and treatment as scheduled. He is doing well. He went on a golf trip this weekend and drank some alcohol. He developed dysphagia and odynophagia that eventually resolved after Mylanta. He otherwise has no issues swallowing or eating most foods. Still has not tried steak. On treatment he feels his regurgitation has improved. Denies n/v/c. He had 2 episodes of diarrhea this morning he assumes is diet related from the weekend. Otherwise, denies fever, chills, cough, chest pain, dyspnea, neuropathy, mucositis. He is on metformin. He checks BG occasionally at home, sees 130-140's.    MEDICAL HISTORY:  Past Medical History:  Diagnosis Date  . Allergy   . Asthma   . Diabetes mellitus without complication ([place])   . Diverticulosis   . GERD (gastroesophageal reflux disease) 2002  . Health maintenance examination     SURGICAL HISTORY: Past Surgical History:  Procedure Laterality Date  . BIOPSY  08/30/2019   Procedure: BIOPSY;  Surgeon: [name], MD;  Location: [facility];  Service: Endoscopy;;  . ESOPHAGOGASTRODUODENOSCOPY (EGD) WITH PROPOFOL N/A 08/30/2019   Procedure: ESOPHAGOGASTRODUODENOSCOPY (EGD) WITH PROPOFOL;  Surgeon: [name], MD;  Location: [facility];  Service: Endoscopy;  Laterality: N/A;  . ESOPHAGOGASTRODUODENOSCOPY (EGD) WITH PROPOFOL N/A 09/13/2019   Procedure: ESOPHAGOGASTRODUODENOSCOPY (EGD) WITH PROPOFOL;  Surgeon: [name], MD;  Location: [facility];  Service: Endoscopy;  Laterality: N/A;  . SKIN BIOPSY N/A 03/04/2019   upper back Nerofibroma and follicular cyst   . SUBMUCOSAL INJECTION  08/30/2019   Procedure: SUBMUCOSAL INJECTION;  Surgeon: [name], MD;  Location: [facility];  Service: Endoscopy;;  . UPPER ESOPHAGEAL ENDOSCOPIC ULTRASOUND (EUS) N/A 09/13/2019   Procedure: UPPER ESOPHAGEAL ENDOSCOPIC ULTRASOUND (EUS);  Surgeon: [name], MD;  Location: [facility];   Service: Endoscopy;  Laterality: N/A;    I have reviewed the social history and family history with the patient and they are unchanged from previous note.  ALLERGIES:  has No Known Allergies.  MEDICATIONS:  Current Outpatient Medications  Medication Sig Dispense Refill  . albuterol (VENTOLIN HFA) 108 (90 Base) MCG/ACT inhaler TAKE 2 PUFFS BY MOUTH EVERY 6 HOURS AS NEEDED FOR WHEEZE 18 g 0  . atorvastatin (LIPITOR) 10 MG tablet Take 10 mg by mouth daily.    Marland Kitchen lisinopril (ZESTRIL) 5 MG tablet Take 5 mg by mouth daily.    . metFORMIN (GLUCOPHAGE) 500 MG tablet Take 1 tablet (500 mg total) by mouth daily with breakfast. 30 tablet 2  . prochlorperazine (COMPAZINE) 10 MG tablet Take 1 tablet (10 mg total) by mouth every 6 (six) hours as needed (Nausea or vomiting). 30 tablet 1   No current facility-administered medications for this visit.   Facility-Administered Medications Ordered in Other Visits  Medication Dose Route Frequency Provider Last Rate Last Admin  . CARBOplatin (PARAPLATIN) 200 mg in sodium chloride 0.9 % 250 mL chemo infusion  200 mg Intravenous Once [name], MD      . dexamethasone (DECADRON) injection 6 mg  6 mg Intravenous Once Alla Feeling, NP      . famotidine (PEPCID) IVPB 20 mg premix  20 mg Intravenous Once [name], MD      . PACLitaxel (TAXOL) 96 mg in sodium chloride 0.9 % 250 mL chemo infusion (</= 80mg /m2)  50 mg/m2 (Treatment Plan Recorded) Intravenous Once [name], MD        PHYSICAL EXAMINATION: ECOG PERFORMANCE STATUS: 1 - Symptomatic but  completely ambulatory  Vitals:   10/14/19 1137  BP: 133/78  Pulse: 71  Resp: 17  Temp: 98.7 F (37.1 C)  SpO2: 100%   Filed Weights   10/14/19 1137  Weight: 174 lb 4.8 oz (79.1 kg)    GENERAL:alert, no distress and comfortable SKIN: no rash or erythema  EYES:  sclera clear LUNGS:  normal breathing effort HEART:  no lower extremity edema ABDOMEN:abdomen round NEURO: alert & oriented x 3 with fluent  speech  LABORATORY DATA:  I have reviewed the data as listed CBC Latest Ref Rng & Units 10/14/2019 10/07/2019 09/30/2019  WBC 4.0 - 10.5 K/uL 2.3(L) 3.4(L) 5.5  Hemoglobin 13.0 - 17.0 g/dL 11.1(L) 11.8(L) 11.9(L)  Hematocrit 39.0 - 52.0 % 32.8(L) 34.5(L) 36.2(L)  Platelets 150 - 400 K/uL 173 248 254     CMP Latest Ref Rng & Units 10/14/2019 10/07/2019 09/30/2019  Glucose 70 - 99 mg/dL 252(H) 249(H) 208(H)  BUN 8 - 23 mg/dL 10 12 11   Creatinine 0.61 - 1.24 mg/dL 0.98 1.12 1.21  Sodium 135 - 145 mmol/L 137 135 137  Potassium 3.5 - 5.1 mmol/L 4.2 4.2 3.9  Chloride 98 - 111 mmol/L 103 102 104  CO2 22 - 32 mmol/L 24 24 25   Calcium 8.9 - 10.3 mg/dL 8.7(L) 8.8(L) 9.2  Total Protein 6.5 - 8.1 g/dL 6.6 6.9 7.1  Total Bilirubin 0.3 - 1.2 mg/dL 0.6 0.5 0.5  Alkaline Phos 38 - 126 U/L 45 46 51  AST 15 - 41 U/L 12(L) 12(L) 15  ALT 0 - 44 U/L 16 17 22       RADIOGRAPHIC STUDIES: I have personally reviewed the radiological images as listed and agreed with the findings in the report. No results found.   ASSESSMENT & PLAN: 69 yo male with   1. Adenocarcinoma of distal esophagus - EGD 08/30/19 showed a large, fungating, partially circumferential mass in the lower third of the esophagus causing dysphagia. EUS 09/13/19 showed locally advanced adenocarcinoma invading the muscular layer and 2 enlarged LNs, T3N2. PET scan showed known esophageal mass and low-level hypermetabolism within the upper abdominal nodes, probably reactive. No evidence of distant metastasis (M0).  -Dr. [name] recommended standard treatment for locally advanced disease including neoadjuvant concurrent chemoRT with taxol and carboplatin. Treatment goal is curative.   -He was seen by Dr. Servando Snare on 10/09/19 who feels he is a surgical candidate at this point and plans to see him back with restaging 6-8 weeks after completing chemoRT. Mr. Martinetti is still considering surgery at this point but not sure  -He began chemoRT on 09/30/19, tolerating  well   2. Solid dysphagia -started early 2021, secondary to #1 -he tolerates normal diet for the most part. Some meat and large pills get stuck  -improved after starting chemoRT, but aggravated by alcohol intake last weekend. He has been counseled to avoid alcohol   3. Tobacco and alcohol use -he has long term tobacco abuse, quit in 03/2019, on 09/23/19 he reportedly had quit drinking also   4. HTN, HL, DM -Managed by PCP, on lisinopril, atorvastatin, and metformin -BG >200 lately in clinic, he checks rarely at home. He reports 130-140 range.  -he had an indulgent weekend, BG 249 today. He will try to make better dietary choices -will reduce dex to 6 mg with premeds -He has occasional dizzy spells on the golf course, he thinks he hydrates adequately. No fall, no orthostatic hypotension in clinic. I encouraged him to continue to hydrate, and he can  hold lisinopril on days he plays golf    Disposition:  Mr. Endrizzi appears stable. He is s/p week 2 of chemoRT with taxol and carboplatin. He is tolerating treatment well overall. His dysphagia had improved on treatment, until a golf trip this weekend where he drank alcohol and dysphagia worsened. This eventually resolved with Mylanta. He is otherwise doing well. Labs adequate to proceed with cycle 1 day 15 (week 3) chemoRT with taxol and carboplatin today. For hyperglycemia BG 250 consistently >200, will decrease dex to 6 mg with each cycle. Pharmacy and Dr. [name] aware.   He was seen by Dr. Servando Snare who feels he is a surgical candidate after chemoradiation. The plan to to see him back 6-8 weeks after chemoRT with restaging. Mr. Esperanza is undecided on whether he wants to pursue surgery at this point but is still considering it.    He will return for lab and f/u on treatment next week.   All questions were answered. The patient knows to call the clinic with any problems, questions or concerns. No barriers to learning was detected.     Alla Feeling, NP 10/14/19

## 2019-10-14 ENCOUNTER — Inpatient Hospital Stay: Payer: PPO

## 2019-10-14 ENCOUNTER — Ambulatory Visit
Admission: RE | Admit: 2019-10-14 | Discharge: 2019-10-14 | Disposition: A | Discharge status: Home / Self Care | Payer: PPO | Source: Ambulatory Visit | Attending: Radiation Oncology | Admitting: Radiation Oncology

## 2019-10-14 ENCOUNTER — Inpatient Hospital Stay (HOSPITAL_BASED_OUTPATIENT_CLINIC_OR_DEPARTMENT_OTHER): Payer: PPO | Admitting: Nurse Practitioner

## 2019-10-14 ENCOUNTER — Other Ambulatory Visit: Payer: Self-pay

## 2019-10-14 ENCOUNTER — Encounter: Payer: Self-pay | Admitting: Nurse Practitioner

## 2019-10-14 VITALS — BP 133/78 | HR 71 | Temp 98.7°F | Resp 17 | Ht 67.0 in | Wt 174.3 lb

## 2019-10-14 DIAGNOSIS — C155 Malignant neoplasm of lower third of esophagus: Secondary | ICD-10-CM

## 2019-10-14 DIAGNOSIS — Z510 Encounter for antineoplastic radiation therapy: Secondary | ICD-10-CM | POA: Diagnosis not present

## 2019-10-14 DIAGNOSIS — Z5111 Encounter for antineoplastic chemotherapy: Secondary | ICD-10-CM | POA: Diagnosis not present

## 2019-10-14 LAB — CBC WITH DIFFERENTIAL (CANCER CENTER ONLY)
Abs Immature Granulocytes: 0.03 10*3/uL (ref 0.00–0.07)
Basophils Absolute: 0 10*3/uL (ref 0.0–0.1)
Basophils Relative: 0 %
Eosinophils Absolute: 0.1 10*3/uL (ref 0.0–0.5)
Eosinophils Relative: 4 %
HCT: 32.8 % — ABNORMAL LOW (ref 39.0–52.0)
Hemoglobin: 11.1 g/dL — ABNORMAL LOW (ref 13.0–17.0)
Immature Granulocytes: 1 %
Lymphocytes Relative: 12 %
Lymphs Abs: 0.3 10*3/uL — ABNORMAL LOW (ref 0.7–4.0)
MCH: 30.3 pg (ref 26.0–34.0)
MCHC: 33.8 g/dL (ref 30.0–36.0)
MCV: 89.6 fL (ref 80.0–100.0)
Monocytes Absolute: 0.2 10*3/uL (ref 0.1–1.0)
Monocytes Relative: 10 %
Neutro Abs: 1.7 10*3/uL (ref 1.7–7.7)
Neutrophils Relative %: 73 %
Platelet Count: 173 10*3/uL (ref 150–400)
RBC: 3.66 MIL/uL — ABNORMAL LOW (ref 4.22–5.81)
RDW: 12.3 % (ref 11.5–15.5)
WBC Count: 2.3 10*3/uL — ABNORMAL LOW (ref 4.0–10.5)
nRBC: 0 % (ref 0.0–0.2)

## 2019-10-14 LAB — CMP (CANCER CENTER ONLY)
ALT: 16 U/L (ref 0–44)
AST: 12 U/L — ABNORMAL LOW (ref 15–41)
Albumin: 3.8 g/dL (ref 3.5–5.0)
Alkaline Phosphatase: 45 U/L (ref 38–126)
Anion gap: 10 (ref 5–15)
BUN: 10 mg/dL (ref 8–23)
CO2: 24 mmol/L (ref 22–32)
Calcium: 8.7 mg/dL — ABNORMAL LOW (ref 8.9–10.3)
Chloride: 103 mmol/L (ref 98–111)
Creatinine: 0.98 mg/dL (ref 0.61–1.24)
GFR, Est AFR Am: >60 mL/min (ref >60)
GFR, Estimated: >60 mL/min (ref >60)
Glucose, Bld: 252 mg/dL — ABNORMAL HIGH (ref 70–99)
Potassium: 4.2 mmol/L (ref 3.5–5.1)
Sodium: 137 mmol/L (ref 135–145)
Total Bilirubin: 0.6 mg/dL (ref 0.3–1.2)
Total Protein: 6.6 g/dL (ref 6.5–8.1)

## 2019-10-14 MED ORDER — PALONOSETRON HCL INJECTION 0.25 MG/5ML
INTRAVENOUS | Status: AC
  Filled 2019-10-14: qty 5

## 2019-10-14 MED ORDER — DIPHENHYDRAMINE HCL 50 MG/ML IJ SOLN
25.0000 mg | Freq: Once | INTRAMUSCULAR | Status: AC
  Administered 2019-10-14: 25 mg via INTRAVENOUS

## 2019-10-14 MED ORDER — DIPHENHYDRAMINE HCL 50 MG/ML IJ SOLN
INTRAMUSCULAR | Status: AC
  Filled 2019-10-14: qty 1

## 2019-10-14 MED ORDER — DEXAMETHASONE SODIUM PHOSPHATE 10 MG/ML IJ SOLN
6.0000 mg | Freq: Once | INTRAMUSCULAR | Status: AC
  Administered 2019-10-14: 6 mg via INTRAVENOUS

## 2019-10-14 MED ORDER — FAMOTIDINE IN NACL 20-0.9 MG/50ML-% IV SOLN
INTRAVENOUS | Status: AC
  Filled 2019-10-14: qty 50

## 2019-10-14 MED ORDER — SODIUM CHLORIDE 0.9 % IV SOLN
50.0000 mg/m2 | Freq: Once | INTRAVENOUS | Status: AC
  Administered 2019-10-14: 96 mg via INTRAVENOUS
  Filled 2019-10-14: qty 16

## 2019-10-14 MED ORDER — DEXAMETHASONE SODIUM PHOSPHATE 10 MG/ML IJ SOLN
INTRAMUSCULAR | Status: AC
  Filled 2019-10-14: qty 1

## 2019-10-14 MED ORDER — FAMOTIDINE IN NACL 20-0.9 MG/50ML-% IV SOLN
20.0000 mg | Freq: Once | INTRAVENOUS | Status: AC
  Administered 2019-10-14: 20 mg via INTRAVENOUS

## 2019-10-14 MED ORDER — SODIUM CHLORIDE 0.9 % IV SOLN
Freq: Once | INTRAVENOUS | Status: AC
  Filled 2019-10-14: qty 250

## 2019-10-14 MED ORDER — SODIUM CHLORIDE 0.9 % IV SOLN
200.0000 mg | Freq: Once | INTRAVENOUS | Status: AC
  Administered 2019-10-14: 200 mg via INTRAVENOUS
  Filled 2019-10-14: qty 20

## 2019-10-14 MED ORDER — PALONOSETRON HCL INJECTION 0.25 MG/5ML
0.2500 mg | Freq: Once | INTRAVENOUS | Status: AC
  Administered 2019-10-14: 0.25 mg via INTRAVENOUS

## 2019-10-14 NOTE — Patient Instructions (Signed)
[facility] Discharge Instructions for Patients Receiving Chemotherapy  Today you received the following chemotherapy agents: Paclitaxel, Carboplatin  To help prevent nausea and vomiting after your treatment, we encourage you to take your nausea medication as directed by your MD.   If you develop nausea and vomiting that is not controlled by your nausea medication, call the clinic.   BELOW ARE SYMPTOMS THAT SHOULD BE REPORTED IMMEDIATELY:  *FEVER GREATER THAN 100.5 F  *CHILLS WITH OR WITHOUT FEVER  NAUSEA AND VOMITING THAT IS NOT CONTROLLED WITH YOUR NAUSEA MEDICATION  *UNUSUAL SHORTNESS OF BREATH  *UNUSUAL BRUISING OR BLEEDING  TENDERNESS IN MOUTH AND THROAT WITH OR WITHOUT PRESENCE OF ULCERS  *URINARY PROBLEMS  *BOWEL PROBLEMS  UNUSUAL RASH Items with * indicate a potential emergency and should be followed up as soon as possible.  Feel free to call the clinic should you have any questions or concerns. The clinic phone number is [phone].  Please show the CHEMO ALERT CARD at check-in to the Emergency Department and triage nurse.  Coronavirus (COVID-19) Are you at risk?  Are you at risk for the Coronavirus (COVID-19)?  To be considered HIGH RISK for Coronavirus (COVID-19), you have to meet the following criteria:  . Traveled to [place], [place], [place], [place] or [place]; or in the [place] to [place], [place], [place], or [place]; and have fever, cough, and shortness of breath within the last 2 weeks of travel OR . Been in close contact with a person diagnosed with COVID-19 within the last 2 weeks and have fever, cough, and shortness of breath . IF YOU DO NOT MEET THESE CRITERIA, YOU ARE CONSIDERED LOW RISK FOR COVID-19.  What to do if you are HIGH RISK for COVID-19?  . If you are having a medical emergency, call 911. . Seek medical care right away. Before you go to a doctor's office, urgent care or emergency department, call ahead and  tell them about your recent travel, contact with someone diagnosed with COVID-19, and your symptoms. You should receive instructions from your physician's office regarding next steps of care.  . When you arrive at healthcare provider, tell the healthcare staff immediately you have returned from visiting [place], [place], [place], [place] or [place]; or traveled in the [place] to [place], [place], [place], or [place]; in the last two weeks or you have been in close contact with a person diagnosed with COVID-19 in the last 2 weeks.   . Tell the health care staff about your symptoms: fever, cough and shortness of breath. . After you have been seen by a medical provider, you will be either: o Tested for (COVID-19) and discharged home on quarantine except to seek medical care if symptoms worsen, and asked to  - Stay home and avoid contact with others until you get your results (4-5 days)  - Avoid travel on public transportation if possible (such as bus, train, or airplane) or o Sent to the Emergency Department by EMS for evaluation, COVID-19 testing, and possible admission depending on your condition and test results.  What to do if you are LOW RISK for COVID-19?  Reduce your risk of any infection by using the same precautions used for avoiding the common cold or flu:  . Wash your hands often with soap and warm water for at least 20 seconds.  If soap and water are not readily available, use an alcohol-based hand sanitizer with at least 60% alcohol.  .   If coughing or sneezing, cover your mouth and nose by coughing or sneezing into the elbow areas of your shirt or coat, into a tissue or into your sleeve (not your hands). . Avoid shaking hands with others and consider head nods or verbal greetings only. . Avoid touching your eyes, nose, or mouth with unwashed hands.  . Avoid close contact with people who are sick. . Avoid places or events with large numbers of people in one location, like  concerts or sporting events. . Carefully consider travel plans you have or are making. . If you are planning any travel outside or inside the [place], visit the CDC's Travelers' Health webpage for the latest health notices. . If you have some symptoms but not all symptoms, continue to monitor at home and seek medical attention if your symptoms worsen. . If you are having a medical emergency, call 911.   ADDITIONAL HEALTHCARE OPTIONS FOR PATIENTS  [facility] / e-Visit: [email or web address]         [facility]: [phone]  [facility]: [phone]                   [facility]: [phone]  

## 2019-10-14 NOTE — Progress Notes (Signed)
Dexamethasone dose reduced to 6mg  for elevated blood glucose per [place].  Demetrius Charity, PharmD, BCPS, [facility] Pharmacist Pharmacy Phone: [phone] 10/14/2019

## 2019-10-15 ENCOUNTER — Ambulatory Visit
Admission: RE | Admit: 2019-10-15 | Discharge: 2019-10-15 | Disposition: A | Discharge status: Home / Self Care | Payer: PPO | Source: Ambulatory Visit | Attending: Radiation Oncology | Admitting: Radiation Oncology

## 2019-10-15 ENCOUNTER — Other Ambulatory Visit: Payer: Self-pay

## 2019-10-15 DIAGNOSIS — C155 Malignant neoplasm of lower third of esophagus: Secondary | ICD-10-CM | POA: Diagnosis not present

## 2019-10-15 DIAGNOSIS — Z510 Encounter for antineoplastic radiation therapy: Secondary | ICD-10-CM | POA: Diagnosis not present

## 2019-10-15 NOTE — Progress Notes (Signed)
Pharmacist Chemotherapy Monitoring - Follow Up Assessment    I verify that I have reviewed each item in the below checklist:  . Regimen for the patient is scheduled for the appropriate day and plan matches scheduled date. Marland Kitchen Appropriate non-routine labs are ordered dependent on drug ordered. . If applicable, additional medications reviewed and ordered per protocol based on lifetime cumulative doses and/or treatment regimen.   Plan for follow-up and/or issues identified: No . I-vent associated with next due treatment: No . MD and/or nursing notified: No   Kennith Center, Pharm.D., CPP 10/15/2019@1 :57 PM

## 2019-10-16 ENCOUNTER — Other Ambulatory Visit: Payer: Self-pay

## 2019-10-16 ENCOUNTER — Ambulatory Visit
Admission: RE | Admit: 2019-10-16 | Discharge: 2019-10-16 | Disposition: A | Discharge status: Home / Self Care | Payer: PPO | Source: Ambulatory Visit | Attending: Radiation Oncology | Admitting: Radiation Oncology

## 2019-10-16 DIAGNOSIS — C155 Malignant neoplasm of lower third of esophagus: Secondary | ICD-10-CM | POA: Diagnosis not present

## 2019-10-16 DIAGNOSIS — Z510 Encounter for antineoplastic radiation therapy: Secondary | ICD-10-CM | POA: Diagnosis not present

## 2019-10-17 ENCOUNTER — Other Ambulatory Visit: Payer: Self-pay

## 2019-10-17 ENCOUNTER — Ambulatory Visit
Admission: RE | Admit: 2019-10-17 | Discharge: 2019-10-17 | Disposition: A | Discharge status: Home / Self Care | Payer: PPO | Source: Ambulatory Visit | Attending: Radiation Oncology | Admitting: Radiation Oncology

## 2019-10-17 DIAGNOSIS — C155 Malignant neoplasm of lower third of esophagus: Secondary | ICD-10-CM | POA: Diagnosis not present

## 2019-10-17 DIAGNOSIS — Z510 Encounter for antineoplastic radiation therapy: Secondary | ICD-10-CM | POA: Diagnosis not present

## 2019-10-17 NOTE — Progress Notes (Signed)
[place]   [phone] [phone]   Clinic Follow up Note   Patient Care Team: Denita Lung, MD as PCP - General (Family Medicine) [name], MD as Consulting Physician (Gastroenterology) [name], MD as Consulting Physician (Hematology) Jonnie Finner, RN as Oncology Nurse Navigator  Date of Service:  10/21/2019  CHIEF COMPLAINT: F/u ofEsophagus cancer  SUMMARY OF ONCOLOGIC HISTORY: Oncology History  Esophageal cancer ([place])  08/30/2019 Procedure   EGD by Dr. [name] 08/30/19  IMPRESSION - Partially obstructing, malignant esophageal tumor was found in the lower third of the esophagus. Biopsied. Injected. - Normal stomach. - Normal examined duodenum.   08/30/2019 Initial Biopsy   FINAL MICROSCOPIC DIAGNOSIS:   A. ESOPHAGUS, BIOPSY:  - At least intramucosal adenocarcinoma.  - See comment.   COMMENT:   - The depth of invasion can not be determined due to the superficial  nature of the biopsy.  Dr.  Vic Ripper has reviewed the case and concurs  with this interpretation.  Additional studies can be performed upon  clinician request.    09/05/2019 Imaging   CT CAP W Contrast 09/05/19  IMPRESSION: 1. Soft tissue mass noted distal esophagus, compatible with known neoplasm. There is a small 6 mm short axis adjacent paraesophageal node, concerning for metastatic disease. 2. Upper normal to mildly enlarged hepatoduodenal ligament lymph node, with upper normal right para-aortic nodes. As metastatic disease cannot be excluded, PET-CT may prove helpful to further evaluate. 3. 1.9 x 1.3 cm nodular collection of soft tissue posterior to the descending duodenum. This is in close proximity to the pancreatic head but a definite communication of parenchyma between these 2 structures is not discernible by CT. 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 Attention on follow-up recommended. 4. 6 mm perifissural nodule  right middle lobe, likely a subpleural lymph node. Attention on follow-up recommended. 5. Hepatomegaly with hepatic steatosis. 6. Ascending thoracic aorta measures 4 cm diameter. Recommend annual imaging followup by CTA or MRA. This recommendation follows 2010 ACCF/AHA/AATS/ACR/ASA/SCA/SCAI/SIR/STS/SVM Guidelines for the Diagnosis and Management of Patients with Thoracic Aortic Disease. Circulation. 2010; [id number]. Aortic aneurysm NOS (ICD10-I71.9) 7. Left-sided IVC, normal variant.   09/11/2019 Tumor Marker   Baseline CEA 25.12 on 09/11/19   09/13/2019 Cancer Staging   Staging form: Esophagus - Adenocarcinoma, AJCC 8th Edition - Clinical stage from 09/13/2019: Stage IVA (cT3, cN2, cM0) - Signed by [name], MD on 09/22/2019   09/13/2019 Procedure   EUS by Dr. [name] 09/13/19  IMPRESSION - A mass was found in the lower third of the esophagus. A tissue diagnosis was obtained prior to this exam. This is of adenocarcinoma. This was staged T3 N3 Mx by endosonographic criteria. - No specimens collected.   09/20/2019 PET scan   PET IMPRESSION: 1. Hypermetabolic distal esophageal primary. 2. Low-level hypermetabolism within upper abdominal nodes. These are technically nonspecific, but favored to be reactive in the setting of hepatic steatosis and hepatomegaly. 3. Otherwise, no evidence of metastatic disease. 4. Anal hypermetabolism could be physiologic. Consider correlation with physical exam and colonoscopy, if patient is not up-to-date. 5. Incidental findings, including: Aortic atherosclerosis (ICD10-I70.0), coronary artery atherosclerosis and emphysema (ICD10-J43.9). Sinus disease and bilateral mastoid effusions.   09/22/2019 Initial Diagnosis   Esophageal cancer ([place])   09/30/2019 -  Chemotherapy   concurrent ChemoRT with weekly Carboplatin and Taxol starting 4/12   09/30/2019 -  Radiation Therapy   concurrent ChemoRT with Dr. Lisbeth Renshaw starting 4/12  CURRENT THERAPY:    concurrent chemoRT with weekly CT for 6 weeks starting 09/30/19   INTERVAL HISTORY:  James Dunn is here for a follow up and treatment. He presents to the clinic alone. He notes mid chest pain (6-7/10) after week 3 ChemoRT. He notes he tried [place] on his latest Golf trip but caused more pain so he did not try it again. He notes he has sucralfate every 4-5 hours without much help. He notes he tried pepto bismol without much help either. He was given Nexium but stopped because it did not help. He is willing to try another antiacid and pain medication.    REVIEW OF SYSTEMS:   Constitutional: Denies fevers, chills or abnormal weight loss Eyes: Denies blurriness of vision Ears, nose, mouth, throat, and face: Denies mucositis or sore throat (+) Mid chest pain  Respiratory: Denies cough, dyspnea or wheezes  Cardiovascular: Denies palpitation, chest discomfort or lower extremity swelling Gastrointestinal:  Denies nausea, heartburn or change in bowel habits Skin: Denies abnormal skin rashes Lymphatics: Denies new lymphadenopathy or easy bruising Neurological:Denies numbness, tingling or new weaknesses Behavioral/Psych: Mood is stable, no new changes  All other systems were reviewed with the patient and are negative.  MEDICAL HISTORY:  Past Medical History:  Diagnosis Date  . Allergy   . Asthma   . Diabetes mellitus without complication ([place])   . Diverticulosis   . GERD (gastroesophageal reflux disease) 2002  . Health maintenance examination     SURGICAL HISTORY: Past Surgical History:  Procedure Laterality Date  . BIOPSY  08/30/2019   Procedure: BIOPSY;  Surgeon: [name], MD;  Location: [facility];  Service: Endoscopy;;  . ESOPHAGOGASTRODUODENOSCOPY (EGD) WITH PROPOFOL N/A 08/30/2019   Procedure: ESOPHAGOGASTRODUODENOSCOPY (EGD) WITH PROPOFOL;  Surgeon: [name], MD;  Location: [facility];  Service: Endoscopy;  Laterality: N/A;  . ESOPHAGOGASTRODUODENOSCOPY (EGD) WITH  PROPOFOL N/A 09/13/2019   Procedure: ESOPHAGOGASTRODUODENOSCOPY (EGD) WITH PROPOFOL;  Surgeon: [name], MD;  Location: [facility];  Service: Endoscopy;  Laterality: N/A;  . SKIN BIOPSY N/A 03/04/2019   upper back Nerofibroma and follicular cyst   . SUBMUCOSAL INJECTION  08/30/2019   Procedure: SUBMUCOSAL INJECTION;  Surgeon: [name], MD;  Location: [facility];  Service: Endoscopy;;  . UPPER ESOPHAGEAL ENDOSCOPIC ULTRASOUND (EUS) N/A 09/13/2019   Procedure: UPPER ESOPHAGEAL ENDOSCOPIC ULTRASOUND (EUS);  Surgeon: [name], MD;  Location: [facility];  Service: Endoscopy;  Laterality: N/A;    I have reviewed the social history and family history with the patient and they are unchanged from previous note.  ALLERGIES:  has No Known Allergies.  MEDICATIONS:  Current Outpatient Medications  Medication Sig Dispense Refill  . albuterol (VENTOLIN HFA) 108 (90 Base) MCG/ACT inhaler TAKE 2 PUFFS BY MOUTH EVERY 6 HOURS AS NEEDED FOR WHEEZE 18 g 0  . atorvastatin (LIPITOR) 10 MG tablet Take 10 mg by mouth daily.    Marland Kitchen HYDROcodone-acetaminophen (NORCO/VICODIN) 5-325 MG tablet Take 0.5-1 tablets by mouth every 6 (six) hours as needed for moderate pain. 30 tablet 0  . lisinopril (ZESTRIL) 5 MG tablet Take 5 mg by mouth daily.    . metFORMIN (GLUCOPHAGE) 500 MG tablet Take 1 tablet (500 mg total) by mouth daily with breakfast. 30 tablet 2  . pantoprazole (PROTONIX) 40 MG tablet Take 1 tablet (40 mg total) by mouth daily. 7 tablet 0  . prochlorperazine (COMPAZINE) 10 MG tablet Take 1 tablet (10 mg total) by mouth every 6 (six) hours as needed (Nausea or vomiting).  30 tablet 1  . sucralfate (CARAFATE) 1 g tablet Take 1 tablet (1 g total) by mouth 4 (four) times daily. Dissolve each tablet in 15 cc water before use. 120 tablet 2   No current facility-administered medications for this visit.    PHYSICAL EXAMINATION: ECOG PERFORMANCE STATUS: 2 - Symptomatic, <50% confined to bed  Vitals:     10/21/19 1108  BP: 115/64  Pulse: 81  Resp: 18  Temp: 98.7 F (37.1 C)  SpO2: 100%   Filed Weights   10/21/19 1108  Weight: 172 lb 1.6 oz (78.1 kg)    Due to COVID19 we will limit examination to appearance. Patient had no complaints.  GENERAL:alert, no distress and comfortable SKIN: skin color normal, no rashes or significant lesions EYES: normal, Conjunctiva are pink and non-injected, sclera clear  NEURO: alert & oriented x 3 with fluent speech   LABORATORY DATA:  I have reviewed the data as listed CBC Latest Ref Rng & Units 10/21/2019 10/14/2019 10/07/2019  WBC 4.0 - 10.5 K/uL 2.6(L) 2.3(L) 3.4(L)  Hemoglobin 13.0 - 17.0 g/dL 10.8(L) 11.1(L) 11.8(L)  Hematocrit 39.0 - 52.0 % 32.1(L) 32.8(L) 34.5(L)  Platelets 150 - 400 K/uL 152 173 248     CMP Latest Ref Rng & Units 10/21/2019 10/14/2019 10/07/2019  Glucose 70 - 99 mg/dL 212(H) 252(H) 249(H)  BUN 8 - 23 mg/dL 10 10 12   Creatinine 0.61 - 1.24 mg/dL 0.93 0.98 1.12  Sodium 135 - 145 mmol/L 138 137 135  Potassium 3.5 - 5.1 mmol/L 3.9 4.2 4.2  Chloride 98 - 111 mmol/L 103 103 102  CO2 22 - 32 mmol/L 26 24 24   Calcium 8.9 - 10.3 mg/dL 8.4(L) 8.7(L) 8.8(L)  Total Protein 6.5 - 8.1 g/dL 6.4(L) 6.6 6.9  Total Bilirubin 0.3 - 1.2 mg/dL 0.6 0.6 0.5  Alkaline Phos 38 - 126 U/L 45 45 46  AST 15 - 41 U/L 12(L) 12(L) 12(L)  ALT 0 - 44 U/L 14 16 17       RADIOGRAPHIC STUDIES: I have personally reviewed the radiological images as listed and agreed with the findings in the report. No results found.   ASSESSMENT & PLAN:  James Dunn is a 69 y.o. male with    1. Adenocarcinoma of distal esophagus, T3N2M0, stage IVA -His 3/12/21EGD showed a large, fungating, partially circumferential mass in the lower third of the esophagus causing dysphagia.His PET from 09/20/19 showed no evidence of metastatic disease. -His EUS from 09/13/19 which showedlocally advanced adenocarcinoma invading the muscular layer with 2 enlarged LNs, T3N2M0. -I  discussed surgery is curative. I reviewed the aggressive nature of esophageal cancer, and the high risk of recurrence after surgery. I recommended neoadjuvant CCRT to downstage his cancer before surgery.  -I started him on standard treatment with neoadjuvant concurrent ChemoRT with weekly Carboplatin and Taxol for 6 weeks beginning 09/30/19.  -S/p week 3 he has developed moderate chest pain from chemoRT, otherwise stable. I reviewed medications and use with him for management. I will call in protonix and hydrocordone today  -Labs reviewed and adequate to proceed with week 4 CT today  -Continue RT  -F/u next week    2.Dysphagiaand new chest pain secondary to chemoRT -Started early 2021, secondary to #1 -He tolerates normal diet for the most part. Some meat and large pills get stucksuch as his metformin -He did not have initial significant weight loss, I do not feel he needs feeding tube at this time, but I discussed he may need tube  feeding with surgery. Will monitor closely.I encouraged him to chew well and drink plenty of fluid.  -He will continue to f/u with Dietician. He currently drinks at least 2-3 Glucerna daily and drinking about 1 gal of water daily. Weight is stable.  -He has sublingual Zofran for antiemetics during chemo. -He notes more mid chest pain up to 6-7/10 from his chemoRT. He has tried Sucralfate, Nexium and pepto without much help. -I recommend he try sucralfate again and I will call in Protonix and hydrocodone for pain (10/21/19). He can start at half tablets until pain above 5/10. He can use Tylenol for breakthrough pain. I reviewed Narcotic use and possible dependence with him and watch for constipation with pain medication use. He can use stool softener or stool softener.    3.H/oTobacco and alcohol use -He has long term tobacco abuse, quit in 03/2019. He notes he has cut out alcohol recently (09/23/19). I recommend he continue cessation.   4. HTN, HL, DM,  GERD -Managed by PCP, on lisinopril, atorvastatin, and metformin -His DM is not well controlled. He notes he may not continue to swallow his 500mg  metformin soon. I discussed he may need to switch to insulin to control his DM. He is apprehensive about this.  -He does have acid reflux which has been flaring lately. He is on antiacid, I called in Protonix today (10/21/19) -His BG is 212 today (10/21/19). I recommend he monitor his BG at least 1-2 times and to reduce sugar in diet given steroids during treatment.  -I discussed if his metformin is not slow release he can take 500mg  Metformin twice a day.    PLAN: -I called in Protonix and Hydrocodone today  -Labs reviewed and adequate to proceed with week 4 chemo CT today  -Continue Radiation  -Lab, F/u and CT next week    No problem-specific Assessment & Plan notes found for this encounter.   No orders of the defined types were placed in this encounter.  All questions were answered. The patient knows to call the clinic with any problems, questions or concerns. No barriers to learning was detected. The total time spent in the appointment was 30 minutes.     [name], MD 10/21/2019   I, Joslyn Devon, am acting as scribe for [name], MD.   I have reviewed the above documentation for accuracy and completeness, and I agree with the above.

## 2019-10-18 ENCOUNTER — Other Ambulatory Visit: Payer: Self-pay

## 2019-10-18 ENCOUNTER — Other Ambulatory Visit: Payer: Self-pay | Admitting: Radiation Oncology

## 2019-10-18 ENCOUNTER — Ambulatory Visit
Admission: RE | Admit: 2019-10-18 | Discharge: 2019-10-18 | Disposition: A | Discharge status: Home / Self Care | Payer: PPO | Source: Ambulatory Visit | Attending: Radiation Oncology | Admitting: Radiation Oncology

## 2019-10-18 DIAGNOSIS — Z510 Encounter for antineoplastic radiation therapy: Secondary | ICD-10-CM | POA: Diagnosis not present

## 2019-10-18 DIAGNOSIS — C155 Malignant neoplasm of lower third of esophagus: Secondary | ICD-10-CM | POA: Diagnosis not present

## 2019-10-18 MED ORDER — SUCRALFATE 1 G PO TABS
1.0000 g | ORAL_TABLET | Freq: Four times a day (QID) | ORAL | 2 refills | Status: DC
Start: 2019-10-18 — End: 2020-06-05

## 2019-10-21 ENCOUNTER — Inpatient Hospital Stay: Payer: PPO | Admitting: Hematology

## 2019-10-21 ENCOUNTER — Ambulatory Visit
Admission: RE | Admit: 2019-10-21 | Discharge: 2019-10-21 | Disposition: A | Discharge status: Home / Self Care | Payer: PPO | Source: Ambulatory Visit | Attending: Radiation Oncology | Admitting: Radiation Oncology

## 2019-10-21 ENCOUNTER — Inpatient Hospital Stay: Payer: PPO | Attending: Nurse Practitioner

## 2019-10-21 ENCOUNTER — Inpatient Hospital Stay: Payer: PPO

## 2019-10-21 ENCOUNTER — Encounter: Payer: PPO | Admitting: Nutrition

## 2019-10-21 ENCOUNTER — Encounter: Payer: Self-pay | Admitting: Hematology

## 2019-10-21 ENCOUNTER — Other Ambulatory Visit: Payer: Self-pay

## 2019-10-21 VITALS — BP 115/64 | HR 81 | Temp 98.7°F | Resp 18 | Ht 67.0 in | Wt 172.1 lb

## 2019-10-21 DIAGNOSIS — R079 Chest pain, unspecified: Secondary | ICD-10-CM | POA: Diagnosis not present

## 2019-10-21 DIAGNOSIS — Z5111 Encounter for antineoplastic chemotherapy: Secondary | ICD-10-CM | POA: Insufficient documentation

## 2019-10-21 DIAGNOSIS — C155 Malignant neoplasm of lower third of esophagus: Secondary | ICD-10-CM | POA: Insufficient documentation

## 2019-10-21 DIAGNOSIS — Z510 Encounter for antineoplastic radiation therapy: Secondary | ICD-10-CM | POA: Insufficient documentation

## 2019-10-21 DIAGNOSIS — R634 Abnormal weight loss: Secondary | ICD-10-CM | POA: Diagnosis not present

## 2019-10-21 DIAGNOSIS — R1310 Dysphagia, unspecified: Secondary | ICD-10-CM | POA: Insufficient documentation

## 2019-10-21 DIAGNOSIS — Z5189 Encounter for other specified aftercare: Secondary | ICD-10-CM | POA: Insufficient documentation

## 2019-10-21 DIAGNOSIS — E119 Type 2 diabetes mellitus without complications: Secondary | ICD-10-CM | POA: Diagnosis not present

## 2019-10-21 LAB — CBC WITH DIFFERENTIAL (CANCER CENTER ONLY)
Abs Immature Granulocytes: 0.03 10*3/uL (ref 0.00–0.07)
Basophils Absolute: 0 10*3/uL (ref 0.0–0.1)
Basophils Relative: 1 %
Eosinophils Absolute: 0 10*3/uL (ref 0.0–0.5)
Eosinophils Relative: 2 %
HCT: 32.1 % — ABNORMAL LOW (ref 39.0–52.0)
Hemoglobin: 10.8 g/dL — ABNORMAL LOW (ref 13.0–17.0)
Immature Granulocytes: 1 %
Lymphocytes Relative: 5 %
Lymphs Abs: 0.1 10*3/uL — ABNORMAL LOW (ref 0.7–4.0)
MCH: 30.5 pg (ref 26.0–34.0)
MCHC: 33.6 g/dL (ref 30.0–36.0)
MCV: 90.7 fL (ref 80.0–100.0)
Monocytes Absolute: 0.2 10*3/uL (ref 0.1–1.0)
Monocytes Relative: 8 %
Neutro Abs: 2.2 10*3/uL (ref 1.7–7.7)
Neutrophils Relative %: 83 %
Platelet Count: 152 10*3/uL (ref 150–400)
RBC: 3.54 MIL/uL — ABNORMAL LOW (ref 4.22–5.81)
RDW: 12.6 % (ref 11.5–15.5)
WBC Count: 2.6 10*3/uL — ABNORMAL LOW (ref 4.0–10.5)
nRBC: 0 % (ref 0.0–0.2)

## 2019-10-21 LAB — CMP (CANCER CENTER ONLY)
ALT: 14 U/L (ref 0–44)
AST: 12 U/L — ABNORMAL LOW (ref 15–41)
Albumin: 3.7 g/dL (ref 3.5–5.0)
Alkaline Phosphatase: 45 U/L (ref 38–126)
Anion gap: 9 (ref 5–15)
BUN: 10 mg/dL (ref 8–23)
CO2: 26 mmol/L (ref 22–32)
Calcium: 8.4 mg/dL — ABNORMAL LOW (ref 8.9–10.3)
Chloride: 103 mmol/L (ref 98–111)
Creatinine: 0.93 mg/dL (ref 0.61–1.24)
GFR, Est AFR Am: >60 mL/min (ref >60)
GFR, Estimated: >60 mL/min (ref >60)
Glucose, Bld: 212 mg/dL — ABNORMAL HIGH (ref 70–99)
Potassium: 3.9 mmol/L (ref 3.5–5.1)
Sodium: 138 mmol/L (ref 135–145)
Total Bilirubin: 0.6 mg/dL (ref 0.3–1.2)
Total Protein: 6.4 g/dL — ABNORMAL LOW (ref 6.5–8.1)

## 2019-10-21 MED ORDER — FAMOTIDINE IN NACL 20-0.9 MG/50ML-% IV SOLN
INTRAVENOUS | Status: AC
  Filled 2019-10-21: qty 50

## 2019-10-21 MED ORDER — SODIUM CHLORIDE 0.9 % IV SOLN
208.8000 mg | Freq: Once | INTRAVENOUS | Status: AC
  Administered 2019-10-21: 210 mg via INTRAVENOUS
  Filled 2019-10-21: qty 21

## 2019-10-21 MED ORDER — DEXAMETHASONE SODIUM PHOSPHATE 10 MG/ML IJ SOLN
6.0000 mg | Freq: Once | INTRAMUSCULAR | Status: AC
  Administered 2019-10-21: 6 mg via INTRAVENOUS

## 2019-10-21 MED ORDER — DIPHENHYDRAMINE HCL 50 MG/ML IJ SOLN
INTRAMUSCULAR | Status: AC
  Filled 2019-10-21: qty 1

## 2019-10-21 MED ORDER — SODIUM CHLORIDE 0.9 % IV SOLN
50.0000 mg/m2 | Freq: Once | INTRAVENOUS | Status: AC
  Administered 2019-10-21: 96 mg via INTRAVENOUS
  Filled 2019-10-21: qty 16

## 2019-10-21 MED ORDER — DEXAMETHASONE SODIUM PHOSPHATE 10 MG/ML IJ SOLN
INTRAMUSCULAR | Status: AC
  Filled 2019-10-21: qty 1

## 2019-10-21 MED ORDER — HYDROCODONE-ACETAMINOPHEN 5-325 MG PO TABS: 0.5000 | ORAL_TABLET | Freq: Four times a day (QID) | ORAL | 0 refills | Status: DC | PRN

## 2019-10-21 MED ORDER — PANTOPRAZOLE SODIUM 40 MG PO TBEC
40.0000 mg | DELAYED_RELEASE_TABLET | Freq: Every day | ORAL | 0 refills | Status: DC
Start: 2019-10-21 — End: 2019-10-28

## 2019-10-21 MED ORDER — PALONOSETRON HCL INJECTION 0.25 MG/5ML
0.2500 mg | Freq: Once | INTRAVENOUS | Status: AC
  Administered 2019-10-21: 0.25 mg via INTRAVENOUS

## 2019-10-21 MED ORDER — PALONOSETRON HCL INJECTION 0.25 MG/5ML
INTRAVENOUS | Status: AC
  Filled 2019-10-21: qty 5

## 2019-10-21 MED ORDER — FAMOTIDINE IN NACL 20-0.9 MG/50ML-% IV SOLN
20.0000 mg | Freq: Once | INTRAVENOUS | Status: AC
  Administered 2019-10-21: 20 mg via INTRAVENOUS

## 2019-10-21 MED ORDER — DIPHENHYDRAMINE HCL 50 MG/ML IJ SOLN
25.0000 mg | Freq: Once | INTRAMUSCULAR | Status: AC
  Administered 2019-10-21: 25 mg via INTRAVENOUS

## 2019-10-21 MED ORDER — SODIUM CHLORIDE 0.9 % IV SOLN
Freq: Once | INTRAVENOUS | Status: AC
  Filled 2019-10-21: qty 250

## 2019-10-21 NOTE — Patient Instructions (Signed)
   [facility] Discharge Instructions for Patients Receiving Chemotherapy  Today you received the following chemotherapy agents Taxol and Carboplatin   To help prevent nausea and vomiting after your treatment, we encourage you to take your nausea medication as directed.    If you develop nausea and vomiting that is not controlled by your nausea medication, call the clinic.   BELOW ARE SYMPTOMS THAT SHOULD BE REPORTED IMMEDIATELY:  *FEVER GREATER THAN 100.5 F  *CHILLS WITH OR WITHOUT FEVER  NAUSEA AND VOMITING THAT IS NOT CONTROLLED WITH YOUR NAUSEA MEDICATION  *UNUSUAL SHORTNESS OF BREATH  *UNUSUAL BRUISING OR BLEEDING  TENDERNESS IN MOUTH AND THROAT WITH OR WITHOUT PRESENCE OF ULCERS  *URINARY PROBLEMS  *BOWEL PROBLEMS  UNUSUAL RASH Items with * indicate a potential emergency and should be followed up as soon as possible.  Feel free to call the clinic should you have any questions or concerns. The clinic phone number is [phone].  Please show the CHEMO ALERT CARD at check-in to the Emergency Department and triage nurse.   

## 2019-10-22 ENCOUNTER — Ambulatory Visit
Admission: RE | Admit: 2019-10-22 | Discharge: 2019-10-22 | Disposition: A | Discharge status: Home / Self Care | Payer: PPO | Source: Ambulatory Visit | Attending: Radiation Oncology | Admitting: Radiation Oncology

## 2019-10-22 ENCOUNTER — Other Ambulatory Visit: Payer: Self-pay

## 2019-10-22 DIAGNOSIS — Z510 Encounter for antineoplastic radiation therapy: Secondary | ICD-10-CM | POA: Diagnosis not present

## 2019-10-22 DIAGNOSIS — C155 Malignant neoplasm of lower third of esophagus: Secondary | ICD-10-CM | POA: Diagnosis not present

## 2019-10-22 NOTE — Progress Notes (Signed)
Pharmacist Chemotherapy Monitoring - Follow Up Assessment    I verify that I have reviewed each item in the below checklist:  . Regimen for the patient is scheduled for the appropriate day and plan matches scheduled date. Marland Kitchen Appropriate non-routine labs are ordered dependent on drug ordered. . If applicable, additional medications reviewed and ordered per protocol based on lifetime cumulative doses and/or treatment regimen.   Plan for follow-up and/or issues identified: No . I-vent associated with next due treatment: No . MD and/or nursing notified: No   Kennith Center, Pharm.D., CPP 10/22/2019@2 :28 PM

## 2019-10-23 ENCOUNTER — Ambulatory Visit
Admission: RE | Admit: 2019-10-23 | Discharge: 2019-10-23 | Disposition: A | Discharge status: Home / Self Care | Payer: PPO | Source: Ambulatory Visit | Attending: Radiation Oncology | Admitting: Radiation Oncology

## 2019-10-23 ENCOUNTER — Other Ambulatory Visit: Payer: Self-pay

## 2019-10-23 DIAGNOSIS — C155 Malignant neoplasm of lower third of esophagus: Secondary | ICD-10-CM | POA: Diagnosis not present

## 2019-10-23 DIAGNOSIS — Z510 Encounter for antineoplastic radiation therapy: Secondary | ICD-10-CM | POA: Diagnosis not present

## 2019-10-24 ENCOUNTER — Ambulatory Visit
Admission: RE | Admit: 2019-10-24 | Discharge: 2019-10-24 | Disposition: A | Discharge status: Home / Self Care | Payer: PPO | Source: Ambulatory Visit | Attending: Radiation Oncology | Admitting: Radiation Oncology

## 2019-10-24 ENCOUNTER — Other Ambulatory Visit: Payer: Self-pay

## 2019-10-24 DIAGNOSIS — C155 Malignant neoplasm of lower third of esophagus: Secondary | ICD-10-CM | POA: Diagnosis not present

## 2019-10-24 DIAGNOSIS — Z510 Encounter for antineoplastic radiation therapy: Secondary | ICD-10-CM | POA: Diagnosis not present

## 2019-10-25 ENCOUNTER — Ambulatory Visit
Admission: RE | Admit: 2019-10-25 | Discharge: 2019-10-25 | Disposition: A | Discharge status: Home / Self Care | Payer: PPO | Source: Ambulatory Visit | Attending: Radiation Oncology | Admitting: Radiation Oncology

## 2019-10-25 ENCOUNTER — Other Ambulatory Visit: Payer: Self-pay

## 2019-10-25 DIAGNOSIS — C155 Malignant neoplasm of lower third of esophagus: Secondary | ICD-10-CM | POA: Diagnosis not present

## 2019-10-25 DIAGNOSIS — Z510 Encounter for antineoplastic radiation therapy: Secondary | ICD-10-CM | POA: Diagnosis not present

## 2019-10-25 NOTE — Progress Notes (Signed)
[facility]   [phone] [phone]   Clinic Follow up Note   Patient Care Team: Denita Lung, MD as PCP - General (Family Medicine) [name], MD as Consulting Physician (Gastroenterology) [name], MD as Consulting Physician (Hematology) Jonnie Finner, RN as Oncology Nurse Navigator  Date of Service:  10/28/2019  CHIEF COMPLAINT: F/u ofEsophagus cancer  SUMMARY OF ONCOLOGIC HISTORY: Oncology History  Esophageal cancer ([place])  08/30/2019 Procedure   EGD by Dr. [name] 08/30/19  IMPRESSION - Partially obstructing, malignant esophageal tumor was found in the lower third of the esophagus. Biopsied. Injected. - Normal stomach. - Normal examined duodenum.   08/30/2019 Initial Biopsy   FINAL MICROSCOPIC DIAGNOSIS:   A. ESOPHAGUS, BIOPSY:  - At least intramucosal adenocarcinoma.  - See comment.   COMMENT:   - The depth of invasion can not be determined due to the superficial  nature of the biopsy.  Dr.  Vic Ripper has reviewed the case and concurs  with this interpretation.  Additional studies can be performed upon  clinician request.    09/05/2019 Imaging   CT CAP W Contrast 09/05/19  IMPRESSION: 1. Soft tissue mass noted distal esophagus, compatible with known neoplasm. There is a small 6 mm short axis adjacent paraesophageal node, concerning for metastatic disease. 2. Upper normal to mildly enlarged hepatoduodenal ligament lymph node, with upper normal right para-aortic nodes. As metastatic disease cannot be excluded, PET-CT may prove helpful to further evaluate. 3. 1.9 x 1.3 cm nodular collection of soft tissue posterior to the descending duodenum. This is in close proximity to the pancreatic head but a definite communication of parenchyma between these 2 structures is not discernible by CT. 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 Attention on follow-up recommended. 4. 6 mm perifissural nodule  right middle lobe, likely a subpleural lymph node. Attention on follow-up recommended. 5. Hepatomegaly with hepatic steatosis. 6. Ascending thoracic aorta measures 4 cm diameter. Recommend annual imaging followup by CTA or MRA. This recommendation follows 2010 ACCF/AHA/AATS/ACR/ASA/SCA/SCAI/SIR/STS/SVM Guidelines for the Diagnosis and Management of Patients with Thoracic Aortic Disease. Circulation. 2010; [id number]. Aortic aneurysm NOS (ICD10-I71.9) 7. Left-sided IVC, normal variant.   09/11/2019 Tumor Marker   Baseline CEA 25.12 on 09/11/19   09/13/2019 Cancer Staging   Staging form: Esophagus - Adenocarcinoma, AJCC 8th Edition - Clinical stage from 09/13/2019: Stage IVA (cT3, cN2, cM0) - Signed by [name], MD on 09/22/2019   09/13/2019 Procedure   EUS by Dr. [name] 09/13/19  IMPRESSION - A mass was found in the lower third of the esophagus. A tissue diagnosis was obtained prior to this exam. This is of adenocarcinoma. This was staged T3 N3 Mx by endosonographic criteria. - No specimens collected.   09/20/2019 PET scan   PET IMPRESSION: 1. Hypermetabolic distal esophageal primary. 2. Low-level hypermetabolism within upper abdominal nodes. These are technically nonspecific, but favored to be reactive in the setting of hepatic steatosis and hepatomegaly. 3. Otherwise, no evidence of metastatic disease. 4. Anal hypermetabolism could be physiologic. Consider correlation with physical exam and colonoscopy, if patient is not up-to-date. 5. Incidental findings, including: Aortic atherosclerosis (ICD10-I70.0), coronary artery atherosclerosis and emphysema (ICD10-J43.9). Sinus disease and bilateral mastoid effusions.   09/22/2019 Initial Diagnosis   Esophageal cancer ([place])   09/30/2019 -  Chemotherapy   concurrent ChemoRT with weekly Carboplatin and Taxol starting 4/12   09/30/2019 -  Radiation Therapy   concurrent ChemoRT with Dr. Lisbeth Renshaw starting 4/12  CURRENT THERAPY:    concurrent chemoRT with weekly CT for 6 weeks starting 09/30/19  INTERVAL HISTORY:  James Dunn is here for a follow up and treatment. He presents to the clinic alone. He notes with week 4 he had more pain. He tried hydrocodone which he increased to 1 tab which helped better than half tablet. Yesterday he took 1/2 tablet 2-3 times a day. He tries not to take it. His weight has been trending down due to less eating due to pain with swallowing. He has been trying to drink Glucerna but this causes pain as well. He notes Mylanta only temporarily reduces his pain. He takes 2 tabs of 500mg  Metformin in the morning, but notes the size is hard to swallow. He denies skin issues from RT.     REVIEW OF SYSTEMS:   Constitutional: Denies fevers, chills (+) weight loss Eyes: Denies blurriness of vision Ears, nose, mouth, throat, and face: Denies mucositis or sore throat (+) Dysphagia, Odynophagia  Respiratory: Denies cough, dyspnea or wheezes Cardiovascular: Denies palpitation, chest discomfort or lower extremity swelling Gastrointestinal:  Denies nausea, heartburn or change in bowel habits (+) Epigastric pain  Skin: Denies abnormal skin rashes Lymphatics: Denies new lymphadenopathy or easy bruising Neurological:Denies numbness, tingling or new weaknesses Behavioral/Psych: Mood is stable, no new changes  All other systems were reviewed with the patient and are negative.  MEDICAL HISTORY:  Past Medical History:  Diagnosis Date  . Allergy   . Asthma   . Diabetes mellitus without complication ([place])   . Diverticulosis   . GERD (gastroesophageal reflux disease) 2002  . Health maintenance examination     SURGICAL HISTORY: Past Surgical History:  Procedure Laterality Date  . BIOPSY  08/30/2019   Procedure: BIOPSY;  Surgeon: [name], MD;  Location: [facility];  Service: Endoscopy;;  . ESOPHAGOGASTRODUODENOSCOPY (EGD) WITH PROPOFOL N/A 08/30/2019   Procedure: ESOPHAGOGASTRODUODENOSCOPY (EGD)  WITH PROPOFOL;  Surgeon: [name], MD;  Location: [facility];  Service: Endoscopy;  Laterality: N/A;  . ESOPHAGOGASTRODUODENOSCOPY (EGD) WITH PROPOFOL N/A 09/13/2019   Procedure: ESOPHAGOGASTRODUODENOSCOPY (EGD) WITH PROPOFOL;  Surgeon: [name], MD;  Location: [facility];  Service: Endoscopy;  Laterality: N/A;  . SKIN BIOPSY N/A 03/04/2019   upper back Nerofibroma and follicular cyst   . SUBMUCOSAL INJECTION  08/30/2019   Procedure: SUBMUCOSAL INJECTION;  Surgeon: [name], MD;  Location: [facility];  Service: Endoscopy;;  . UPPER ESOPHAGEAL ENDOSCOPIC ULTRASOUND (EUS) N/A 09/13/2019   Procedure: UPPER ESOPHAGEAL ENDOSCOPIC ULTRASOUND (EUS);  Surgeon: [name], MD;  Location: [facility];  Service: Endoscopy;  Laterality: N/A;    I have reviewed the social history and family history with the patient and they are unchanged from previous note.  ALLERGIES:  has No Known Allergies.  MEDICATIONS:  Current Outpatient Medications  Medication Sig Dispense Refill  . albuterol (VENTOLIN HFA) 108 (90 Base) MCG/ACT inhaler TAKE 2 PUFFS BY MOUTH EVERY 6 HOURS AS NEEDED FOR WHEEZE 18 g 0  . atorvastatin (LIPITOR) 10 MG tablet Take 10 mg by mouth daily.    Marland Kitchen HYDROcodone-acetaminophen (NORCO/VICODIN) 5-325 MG tablet Take 0.5-1 tablets by mouth every 6 (six) hours as needed for moderate pain. 30 tablet 0  . lisinopril (ZESTRIL) 5 MG tablet Take 5 mg by mouth daily.    . metFORMIN (GLUCOPHAGE) 500 MG tablet Take 1 tablet (500 mg total) by mouth daily with breakfast. 30 tablet 2  . oxyCODONE (OXY IR/ROXICODONE) 5 MG immediate release tablet Take 1-2 tablets (5-10 mg total)  by mouth every 6 (six) hours as needed for severe pain. 30 tablet 0  . pantoprazole (PROTONIX) 40 MG tablet Take 1 tablet (40 mg total) by mouth daily. 30 tablet 0  . prochlorperazine (COMPAZINE) 10 MG tablet Take 1 tablet (10 mg total) by mouth every 6 (six) hours as needed (Nausea or vomiting). 30 tablet 1  .  sucralfate (CARAFATE) 1 g tablet Take 1 tablet (1 g total) by mouth 4 (four) times daily. Dissolve each tablet in 15 cc water before use. 120 tablet 2   No current facility-administered medications for this visit.   Facility-Administered Medications Ordered in Other Visits  Medication Dose Route Frequency Provider Last Rate Last Admin  . 0.9 %  sodium chloride infusion   Intravenous Continuous [name], MD 500 mL/hr at 10/28/19 1401 New Bag at 10/28/19 1401  . CARBOplatin (PARAPLATIN) 210 mg in sodium chloride 0.9 % 250 mL chemo infusion  210 mg Intravenous Once [name], MD      . PACLitaxel (TAXOL) 96 mg in sodium chloride 0.9 % 250 mL chemo infusion (</= 80mg /m2)  50 mg/m2 (Treatment Plan Recorded) Intravenous Once [name], MD 266 mL/hr at 10/28/19 1339 96 mg at 10/28/19 1339    PHYSICAL EXAMINATION: ECOG PERFORMANCE STATUS: 1 - Symptomatic but completely ambulatory  Vitals:   10/28/19 1109  BP: 98/64  Pulse: 90  Resp: 17  Temp: 99.4 F (37.4 C)  SpO2: 100%   Filed Weights   10/28/19 1109  Weight: 167 lb 3.2 oz (75.8 kg)     GENERAL:alert, no distress and comfortable SKIN: skin color, texture, turgor are normal, no rashes or significant lesions EYES: normal, Conjunctiva are pink and non-injected, sclera clear (+) Skin hyperpigmentation from RT NECK: supple, thyroid normal size, non-tender, without nodularity LYMPH:  no palpable lymphadenopathy in the cervical, axillary  LUNGS: clear to auscultation and percussion with normal breathing effort HEART: regular rate & rhythm and no murmurs and no lower extremity edema ABDOMEN:abdomen soft, non-tender and normal bowel sounds Musculoskeletal:no cyanosis of digits and no clubbing  NEURO: alert & oriented x 3 with fluent speech, no focal motor/sensory deficits  LABORATORY DATA:  I have reviewed the data as listed CBC Latest Ref Rng & Units 10/28/2019 10/21/2019 10/14/2019  WBC 4.0 - 10.5 K/uL 2.3(L) 2.6(L) 2.3(L)  Hemoglobin 13.0  - 17.0 g/dL 10.4(L) 10.8(L) 11.1(L)  Hematocrit 39.0 - 52.0 % 30.6(L) 32.1(L) 32.8(L)  Platelets 150 - 400 K/uL 117(L) 152 173     CMP Latest Ref Rng & Units 10/28/2019 10/21/2019 10/14/2019  Glucose 70 - 99 mg/dL 183(H) 212(H) 252(H)  BUN 8 - 23 mg/dL 12 10 10   Creatinine 0.61 - 1.24 mg/dL 0.92 0.93 0.98  Sodium 135 - 145 mmol/L 136 138 137  Potassium 3.5 - 5.1 mmol/L 4.2 3.9 4.2  Chloride 98 - 111 mmol/L 98 103 103  CO2 22 - 32 mmol/L 27 26 24   Calcium 8.9 - 10.3 mg/dL 8.8(L) 8.4(L) 8.7(L)  Total Protein 6.5 - 8.1 g/dL 6.7 6.4(L) 6.6  Total Bilirubin 0.3 - 1.2 mg/dL 0.6 0.6 0.6  Alkaline Phos 38 - 126 U/L 49 45 45  AST 15 - 41 U/L 11(L) 12(L) 12(L)  ALT 0 - 44 U/L 12 14 16       RADIOGRAPHIC STUDIES: I have personally reviewed the radiological images as listed and agreed with the findings in the report. No results found.   ASSESSMENT & PLAN:  James Dunn is a 69 y.o. male with  1. Adenocarcinoma of distal esophagus, T3N2M0, stage IVA -His 3/12/21EGD showed a large, fungating, partially circumferential mass in the lower third of the esophagus causing dysphagia.His PET from 09/20/19 showedno evidence of metastatic disease. -HisEUS from 09/13/19 which showedlocally advanced adenocarcinoma invading the muscular layer with 2 enlarged LNs, T3N2M0. -Idiscussed surgery is curative. Ireviewed the aggressive nature of esophageal cancer, and the high risk of recurrence after surgery.I recommended neoadjuvant CCRTto downstage his cancer before surgery. -Istarted him onstandard treatmentwithneoadjuvant concurrent ChemoRT with weekly Carboplatin and Taxol for 6 weeksbeginning 09/30/19.  -S/p week 4 his weight is trending down to due poorly controlled pain. We discussed pain management.  -Labs reviewed, WBC 2.3, Hg 10.4, plt 117K. Will given IV Fluids today, later this week and next week. Overall adequate to proceed with week 5 CT today -Continue RT  -F/u next week     2.Dysphagia, Odynophagiaand chest pain secondary to chemoRT -Started early 2021, secondary to #1 -He tolerates normal diet for the most part. Some meat and large pills get stucksuch as his metformin -He did not have initial significant weight loss, I do not feel he needs feeding tube at this time, but I discussed he may need tube feeding with surgery. Will monitor closely.I encouraged him to chew well and drink plenty of fluid.  -He has sublingualZofran for antiemetics during chemo. -He notes more mid chest pain up to 6-7/10 from his chemoRT. He has tried Sucralfate, Nexium and pepto without much help. -He has been trying to manage liquid and soft foods diet along with 2-3 Glucerna daily, but finds it difficult due to pain.  -He has Protonix but finds Mylanta helps temporarily reduce pain. He has been taking Hydrocodone 1/2 tablet 2-3 times a day which has not controlled his pain. Whole tablets have helped better but not for long. With pain he has been eating less and his weight is trending down.  -I will increase him to Oxycodone 1-2 tabs q6hours as needed (10/28/19) after he completed Hydrocodone 1 tab q4hours as needed.  -I recommend he eats within 30-60 minutes of taking pain medication. I discussed with pain medication increase he needs to watch for constipation.  -I recommend he continue to f/u with Dietician.   3.H/oTobacco and alcohol use -He has long term tobacco abuse, quit in 03/2019.He notes he has cut out alcohol recently (09/23/19). I recommend he continue cessation.   4. HTN, HL, DM, GERD -Managed by PCP, on lisinopril, atorvastatin, and metformin -His DM is not well controlled. He notes he may not continue to swallow his 500mg  metformin soon. I discussed he may need to switch to insulin to control his DM. He is apprehensive about this.  -He does have acid reflux which has been flaring lately. I previously called in Protonix (10/21/19).  -I recommended he monitor  his BG at least 1-2 times and to reduce sugar in diet given steroids during treatment.  -I discussed if his metformin is not slow release he can take 500mg  Metformin twice a day.  -BP decreased to 98/64 today (10/28/19). He has less intake. I recommend IV fluids today. I also recommend he watch for dehydration if he Golfs.    PLAN: -I called in Oxycodone today  -Labs reviewed and adequate to proceed with week 5 CT today  -Continue Radiation  -2hr IV Fluids today and on 5/13 and 5/19 -Add 2 hr IV Fluids on 5/17 after chemo  -Lab and F/u in 1 weeks  -Copy message to Dietician   No  problem-specific Assessment & Plan notes found for this encounter.   No orders of the defined types were placed in this encounter.  All questions were answered. The patient knows to call the clinic with any problems, questions or concerns. No barriers to learning was detected. The total time spent in the appointment was 30 minutes.     [name], MD 10/28/2019   I, Joslyn Devon, am acting as scribe for [name], MD.   I have reviewed the above documentation for accuracy and completeness, and I agree with the above.

## 2019-10-28 ENCOUNTER — Encounter: Payer: Self-pay | Admitting: Hematology

## 2019-10-28 ENCOUNTER — Inpatient Hospital Stay: Payer: PPO

## 2019-10-28 ENCOUNTER — Ambulatory Visit
Admission: RE | Admit: 2019-10-28 | Discharge: 2019-10-28 | Disposition: A | Discharge status: Home / Self Care | Payer: PPO | Source: Ambulatory Visit | Attending: Radiation Oncology | Admitting: Radiation Oncology

## 2019-10-28 ENCOUNTER — Inpatient Hospital Stay (HOSPITAL_BASED_OUTPATIENT_CLINIC_OR_DEPARTMENT_OTHER): Payer: PPO | Admitting: Hematology

## 2019-10-28 ENCOUNTER — Other Ambulatory Visit: Payer: Self-pay

## 2019-10-28 ENCOUNTER — Ambulatory Visit: Payer: PPO | Admitting: Nutrition

## 2019-10-28 VITALS — BP 98/64 | HR 90 | Temp 99.4°F | Resp 17 | Ht 67.0 in | Wt 167.2 lb

## 2019-10-28 DIAGNOSIS — C155 Malignant neoplasm of lower third of esophagus: Secondary | ICD-10-CM

## 2019-10-28 DIAGNOSIS — Z510 Encounter for antineoplastic radiation therapy: Secondary | ICD-10-CM | POA: Diagnosis not present

## 2019-10-28 DIAGNOSIS — E119 Type 2 diabetes mellitus without complications: Secondary | ICD-10-CM | POA: Diagnosis not present

## 2019-10-28 DIAGNOSIS — R1013 Epigastric pain: Secondary | ICD-10-CM

## 2019-10-28 DIAGNOSIS — Z5111 Encounter for antineoplastic chemotherapy: Secondary | ICD-10-CM | POA: Diagnosis not present

## 2019-10-28 DIAGNOSIS — E860 Dehydration: Secondary | ICD-10-CM

## 2019-10-28 LAB — CBC WITH DIFFERENTIAL (CANCER CENTER ONLY)
Abs Immature Granulocytes: 0.01 10*3/uL (ref 0.00–0.07)
Basophils Absolute: 0 10*3/uL (ref 0.0–0.1)
Basophils Relative: 1 %
Eosinophils Absolute: 0 10*3/uL (ref 0.0–0.5)
Eosinophils Relative: 2 %
HCT: 30.6 % — ABNORMAL LOW (ref 39.0–52.0)
Hemoglobin: 10.4 g/dL — ABNORMAL LOW (ref 13.0–17.0)
Immature Granulocytes: 0 %
Lymphocytes Relative: 4 %
Lymphs Abs: 0.1 10*3/uL — ABNORMAL LOW (ref 0.7–4.0)
MCH: 29.9 pg (ref 26.0–34.0)
MCHC: 34 g/dL (ref 30.0–36.0)
MCV: 87.9 fL (ref 80.0–100.0)
Monocytes Absolute: 0.3 10*3/uL (ref 0.1–1.0)
Monocytes Relative: 11 %
Neutro Abs: 1.9 10*3/uL (ref 1.7–7.7)
Neutrophils Relative %: 82 %
Platelet Count: 117 10*3/uL — ABNORMAL LOW (ref 150–400)
RBC: 3.48 MIL/uL — ABNORMAL LOW (ref 4.22–5.81)
RDW: 13.1 % (ref 11.5–15.5)
WBC Count: 2.3 10*3/uL — ABNORMAL LOW (ref 4.0–10.5)
nRBC: 0 % (ref 0.0–0.2)

## 2019-10-28 LAB — CMP (CANCER CENTER ONLY)
ALT: 12 U/L (ref 0–44)
AST: 11 U/L — ABNORMAL LOW (ref 15–41)
Albumin: 3.7 g/dL (ref 3.5–5.0)
Alkaline Phosphatase: 49 U/L (ref 38–126)
Anion gap: 11 (ref 5–15)
BUN: 12 mg/dL (ref 8–23)
CO2: 27 mmol/L (ref 22–32)
Calcium: 8.8 mg/dL — ABNORMAL LOW (ref 8.9–10.3)
Chloride: 98 mmol/L (ref 98–111)
Creatinine: 0.92 mg/dL (ref 0.61–1.24)
GFR, Est AFR Am: >60 mL/min (ref >60)
GFR, Estimated: >60 mL/min (ref >60)
Glucose, Bld: 183 mg/dL — ABNORMAL HIGH (ref 70–99)
Potassium: 4.2 mmol/L (ref 3.5–5.1)
Sodium: 136 mmol/L (ref 135–145)
Total Bilirubin: 0.6 mg/dL (ref 0.3–1.2)
Total Protein: 6.7 g/dL (ref 6.5–8.1)

## 2019-10-28 MED ORDER — FAMOTIDINE IN NACL 20-0.9 MG/50ML-% IV SOLN
20.0000 mg | Freq: Once | INTRAVENOUS | Status: AC
  Administered 2019-10-28: 20 mg via INTRAVENOUS

## 2019-10-28 MED ORDER — PALONOSETRON HCL INJECTION 0.25 MG/5ML
0.2500 mg | Freq: Once | INTRAVENOUS | Status: AC
  Administered 2019-10-28: 0.25 mg via INTRAVENOUS

## 2019-10-28 MED ORDER — SODIUM CHLORIDE 0.9 % IV SOLN
INTRAVENOUS | Status: AC
  Filled 2019-10-28 (×2): qty 250

## 2019-10-28 MED ORDER — DEXAMETHASONE SODIUM PHOSPHATE 10 MG/ML IJ SOLN
6.0000 mg | Freq: Once | INTRAMUSCULAR | Status: AC
  Administered 2019-10-28: 6 mg via INTRAVENOUS

## 2019-10-28 MED ORDER — DIPHENHYDRAMINE HCL 50 MG/ML IJ SOLN
25.0000 mg | Freq: Once | INTRAMUSCULAR | Status: AC
  Administered 2019-10-28: 25 mg via INTRAVENOUS

## 2019-10-28 MED ORDER — DIPHENHYDRAMINE HCL 50 MG/ML IJ SOLN
INTRAMUSCULAR | Status: AC
  Filled 2019-10-28: qty 1

## 2019-10-28 MED ORDER — OXYCODONE HCL 5 MG PO TABS: 5.0000 mg | ORAL_TABLET | Freq: Four times a day (QID) | ORAL | 0 refills | Status: DC | PRN

## 2019-10-28 MED ORDER — SODIUM CHLORIDE 0.9 % IV SOLN
208.8000 mg | Freq: Once | INTRAVENOUS | Status: AC
  Administered 2019-10-28: 210 mg via INTRAVENOUS
  Filled 2019-10-28: qty 21

## 2019-10-28 MED ORDER — OXYCODONE-ACETAMINOPHEN 5-325 MG PO TABS
ORAL_TABLET | ORAL | Status: AC
  Filled 2019-10-28: qty 1

## 2019-10-28 MED ORDER — PALONOSETRON HCL INJECTION 0.25 MG/5ML
INTRAVENOUS | Status: AC
  Filled 2019-10-28: qty 5

## 2019-10-28 MED ORDER — FAMOTIDINE IN NACL 20-0.9 MG/50ML-% IV SOLN
INTRAVENOUS | Status: AC
  Filled 2019-10-28: qty 50

## 2019-10-28 MED ORDER — DEXAMETHASONE SODIUM PHOSPHATE 10 MG/ML IJ SOLN
INTRAMUSCULAR | Status: AC
  Filled 2019-10-28: qty 1

## 2019-10-28 MED ORDER — SODIUM CHLORIDE 0.9 % IV SOLN
50.0000 mg/m2 | Freq: Once | INTRAVENOUS | Status: AC
  Administered 2019-10-28: 96 mg via INTRAVENOUS
  Filled 2019-10-28: qty 16

## 2019-10-28 MED ORDER — OXYCODONE-ACETAMINOPHEN 5-325 MG PO TABS
1.0000 | ORAL_TABLET | ORAL | Status: AC
  Administered 2019-10-28: 1 via ORAL

## 2019-10-28 MED ORDER — SODIUM CHLORIDE 0.9 % IV SOLN
Freq: Once | INTRAVENOUS | Status: AC
  Filled 2019-10-28: qty 250

## 2019-10-28 MED ORDER — PANTOPRAZOLE SODIUM 40 MG PO TBEC: 40.0000 mg | DELAYED_RELEASE_TABLET | Freq: Every day | ORAL | 0 refills | Status: DC

## 2019-10-28 NOTE — Patient Instructions (Signed)
   [facility] Discharge Instructions for Patients Receiving Chemotherapy  Today you received the following chemotherapy agents Taxol and Carboplatin   To help prevent nausea and vomiting after your treatment, we encourage you to take your nausea medication as directed.    If you develop nausea and vomiting that is not controlled by your nausea medication, call the clinic.   BELOW ARE SYMPTOMS THAT SHOULD BE REPORTED IMMEDIATELY:  *FEVER GREATER THAN 100.5 F  *CHILLS WITH OR WITHOUT FEVER  NAUSEA AND VOMITING THAT IS NOT CONTROLLED WITH YOUR NAUSEA MEDICATION  *UNUSUAL SHORTNESS OF BREATH  *UNUSUAL BRUISING OR BLEEDING  TENDERNESS IN MOUTH AND THROAT WITH OR WITHOUT PRESENCE OF ULCERS  *URINARY PROBLEMS  *BOWEL PROBLEMS  UNUSUAL RASH Items with * indicate a potential emergency and should be followed up as soon as possible.  Feel free to call the clinic should you have any questions or concerns. The clinic phone number is [phone].  Please show the CHEMO ALERT CARD at check-in to the Emergency Department and triage nurse.   

## 2019-10-28 NOTE — Addendum Note (Signed)
Addended by: [name] on: 10/28/2019 02:20 PM   Modules accepted: Orders

## 2019-10-28 NOTE — Progress Notes (Signed)
Nutrition follow-up completed with patient during infusion for esophageal cancer. Weight decreased and documented as 167.2 pounds on May 10.  This is down from 184 pounds January 2021. Patient reports increased pain when swallowing.  He can hardly even swallow water. Notes he dislikes taking pain medication. He has been drinking Glucerna however even that is difficult to swallow.. Patient has lost 9% of his body weight over 5 months.  Nutrition diagnosis: Unintended weight loss continues.  Intervention: Change Glucerna to Ensure Plus or boost plus or Ensure Enlive as tolerated.  This provides additional calories in the same volume secondary to difficulty swallowing. Patient agreeable to trying a different nutrition supplement. I provided samples and coupons.   Encourage patient to take pain medication so that he is better able to swallow. Provided support and encouragement.  Monitoring, evaluation, goals: Patient will work to increase calories and protein to minimize further weight loss.  Next visit: RD will follow-up with patient on Monday, May 17 to determine if patient needs additional oral nutrition supplements samples.  **Disclaimer: This note was dictated with voice recognition software. Similar sounding words can inadvertently be transcribed and this note may contain transcription errors which may not have been corrected upon publication of note.**

## 2019-10-29 ENCOUNTER — Other Ambulatory Visit: Payer: Self-pay

## 2019-10-29 ENCOUNTER — Ambulatory Visit
Admission: RE | Admit: 2019-10-29 | Discharge: 2019-10-29 | Disposition: A | Discharge status: Home / Self Care | Payer: PPO | Source: Ambulatory Visit | Attending: Radiation Oncology | Admitting: Radiation Oncology

## 2019-10-29 DIAGNOSIS — Z510 Encounter for antineoplastic radiation therapy: Secondary | ICD-10-CM | POA: Diagnosis not present

## 2019-10-29 DIAGNOSIS — C155 Malignant neoplasm of lower third of esophagus: Secondary | ICD-10-CM | POA: Diagnosis not present

## 2019-10-29 NOTE — Progress Notes (Signed)
Pharmacist Chemotherapy Monitoring - Follow Up Assessment    I verify that I have reviewed each item in the below checklist:  . Regimen for the patient is scheduled for the appropriate day and plan matches scheduled date. Marland Kitchen Appropriate non-routine labs are ordered dependent on drug ordered. . If applicable, additional medications reviewed and ordered per protocol based on lifetime cumulative doses and/or treatment regimen.   Plan for follow-up and/or issues identified: No . I-vent associated with next due treatment: No . MD and/or nursing notified: No  Tora Kindred 10/29/2019 1:55 PM

## 2019-10-30 ENCOUNTER — Ambulatory Visit
Admission: RE | Admit: 2019-10-30 | Discharge: 2019-10-30 | Disposition: A | Discharge status: Home / Self Care | Payer: PPO | Source: Ambulatory Visit | Attending: Radiation Oncology | Admitting: Radiation Oncology

## 2019-10-30 ENCOUNTER — Other Ambulatory Visit: Payer: Self-pay

## 2019-10-30 DIAGNOSIS — Z510 Encounter for antineoplastic radiation therapy: Secondary | ICD-10-CM | POA: Diagnosis not present

## 2019-10-30 DIAGNOSIS — C155 Malignant neoplasm of lower third of esophagus: Secondary | ICD-10-CM | POA: Diagnosis not present

## 2019-10-31 ENCOUNTER — Inpatient Hospital Stay: Payer: PPO

## 2019-10-31 ENCOUNTER — Other Ambulatory Visit: Payer: Self-pay

## 2019-10-31 ENCOUNTER — Ambulatory Visit
Admission: RE | Admit: 2019-10-31 | Discharge: 2019-10-31 | Disposition: A | Discharge status: Home / Self Care | Payer: PPO | Source: Ambulatory Visit | Attending: Radiation Oncology | Admitting: Radiation Oncology

## 2019-10-31 VITALS — BP 107/61 | HR 75 | Temp 98.2°F | Resp 18

## 2019-10-31 DIAGNOSIS — Z510 Encounter for antineoplastic radiation therapy: Secondary | ICD-10-CM | POA: Diagnosis not present

## 2019-10-31 DIAGNOSIS — C155 Malignant neoplasm of lower third of esophagus: Secondary | ICD-10-CM

## 2019-10-31 DIAGNOSIS — Z5111 Encounter for antineoplastic chemotherapy: Secondary | ICD-10-CM | POA: Diagnosis not present

## 2019-10-31 MED ORDER — SODIUM CHLORIDE 0.9 % IV SOLN
Freq: Once | INTRAVENOUS | Status: AC
  Filled 2019-10-31: qty 250

## 2019-10-31 NOTE — Patient Instructions (Signed)

## 2019-11-01 ENCOUNTER — Other Ambulatory Visit: Payer: Self-pay

## 2019-11-01 ENCOUNTER — Other Ambulatory Visit: Payer: Self-pay | Admitting: Radiation Oncology

## 2019-11-01 ENCOUNTER — Ambulatory Visit
Admission: RE | Admit: 2019-11-01 | Discharge: 2019-11-01 | Disposition: A | Discharge status: Home / Self Care | Payer: PPO | Source: Ambulatory Visit | Attending: Radiation Oncology | Admitting: Radiation Oncology

## 2019-11-01 DIAGNOSIS — Z510 Encounter for antineoplastic radiation therapy: Secondary | ICD-10-CM | POA: Diagnosis not present

## 2019-11-01 DIAGNOSIS — C155 Malignant neoplasm of lower third of esophagus: Secondary | ICD-10-CM | POA: Diagnosis not present

## 2019-11-01 MED ORDER — LIDOCAINE VISCOUS HCL 2 % MT SOLN
10.0000 mL | OROMUCOSAL | 1 refills | Status: DC | PRN
Start: 2019-11-01 — End: 2020-03-20

## 2019-11-01 NOTE — Progress Notes (Signed)
[name]   [phone] [phone]   Clinic Follow up Note   Patient Care Team: James Lung, MD as PCP - General (Family Medicine) James Ada, MD as Consulting Physician (Gastroenterology) James Merle, MD as Consulting Physician (Hematology) James Finner, RN as Oncology Nurse Navigator  Date of Service:  11/04/2019  CHIEF COMPLAINT:  F/u ofEsophagus cancer  SUMMARY OF ONCOLOGIC HISTORY: Oncology History  Esophageal cancer ([place])  08/30/2019 Procedure   EGD by Dr. Benson Dunn 08/30/19  IMPRESSION - Partially obstructing, malignant esophageal tumor was found in the lower third of the esophagus. Biopsied. Injected. - Normal stomach. - Normal examined duodenum.   08/30/2019 Initial Biopsy   FINAL MICROSCOPIC DIAGNOSIS:   A. ESOPHAGUS, BIOPSY:  - At least intramucosal adenocarcinoma.  - See comment.   COMMENT:   - The depth of invasion can not be determined due to the superficial  nature of the biopsy.  Dr.  Vic Dunn has reviewed the case and concurs  with this interpretation.  Additional studies can be performed upon  clinician request.    09/05/2019 Imaging   CT CAP W Contrast 09/05/19  IMPRESSION: 1. Soft tissue mass noted distal esophagus, compatible with known neoplasm. There is a small 6 mm short axis adjacent paraesophageal node, concerning for metastatic disease. 2. Upper normal to mildly enlarged hepatoduodenal ligament lymph node, with upper normal right para-aortic nodes. As metastatic disease cannot be excluded, PET-CT may prove helpful to further evaluate. 3. 1.9 x 1.3 cm nodular collection of soft tissue posterior to the descending duodenum. This is in close proximity to the pancreatic head but a definite communication of parenchyma between these 2 structures is not discernible by CT. 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 Attention on follow-up recommended. 4. 6 mm perifissural nodule  right middle lobe, likely a subpleural lymph node. Attention on follow-up recommended. 5. Hepatomegaly with hepatic steatosis. 6. Ascending thoracic aorta measures 4 cm diameter. Recommend annual imaging followup by CTA or MRA. This recommendation follows 2010 ACCF/AHA/AATS/ACR/ASA/SCA/SCAI/SIR/STS/SVM Guidelines for the Diagnosis and Management of Patients with Thoracic Aortic Disease. Circulation. 2010; [id number]. Aortic aneurysm NOS (ICD10-I71.9) 7. Left-sided IVC, normal variant.   09/11/2019 Tumor Marker   Baseline CEA 25.12 on 09/11/19   09/13/2019 Cancer Staging   Staging form: Esophagus - Adenocarcinoma, AJCC 8th Edition - Clinical stage from 09/13/2019: Stage IVA (cT3, cN2, cM0) - Signed by James Merle, MD on 09/22/2019   09/13/2019 Procedure   EUS by Dr. Benson Dunn 09/13/19  IMPRESSION - A mass was found in the lower third of the esophagus. A tissue diagnosis was obtained prior to this exam. This is of adenocarcinoma. This was staged T3 N3 Mx by endosonographic criteria. - No specimens collected.   09/20/2019 PET scan   PET IMPRESSION: 1. Hypermetabolic distal esophageal primary. 2. Low-level hypermetabolism within upper abdominal nodes. These are technically nonspecific, but favored to be reactive in the setting of hepatic steatosis and hepatomegaly. 3. Otherwise, no evidence of metastatic disease. 4. Anal hypermetabolism could be physiologic. Consider correlation with physical exam and colonoscopy, if patient is not up-to-date. 5. Incidental findings, including: Aortic atherosclerosis (ICD10-I70.0), coronary artery atherosclerosis and emphysema (ICD10-J43.9). Sinus disease and bilateral mastoid effusions.   09/22/2019 Initial Diagnosis   Esophageal cancer ([place])   09/30/2019 - 11/04/2019 Chemotherapy   concurrent ChemoRT with weekly Carboplatin and Taxol starting 09/30/19. Last dose was Taxol alone given neutropenia.    09/30/2019 - 11/06/2019 Radiation Therapy   concurrent  ChemoRT with Dr. Lisbeth Dunn starting 09/30/19-11/06/19      CURRENT THERAPY:  concurrent chemoRT with weekly CT for 6 weeks starting 09/30/19  INTERVAL HISTORY:  James Dunn is here for a follow up and treatment. He presents to the clinic alone. He notes again he take Metformin 500mg  BID. He notes he completed Hydrocodone this morning about 1 tablet every 4-5 hours. He notes he plans to start Oxycodone moving forward. He notes he is still eating less due to pain and weight is trending down.  He notes his pain flares, some days are constant pain others are intermittent.  He notes IV fluids helped him last week, especially his BP. He notes his energy level is adequate to do what he can at home just not as much at once.    REVIEW OF SYSTEMS:   Constitutional: Denies fevers, chills (+) weight loss Eyes: Denies blurriness of vision Ears, nose, mouth, throat, and face: Denies mucositis or sore throat (+) Dysphagia, Odynophagia, lower chest pain Respiratory: Denies cough, dyspnea or wheezes Cardiovascular: Denies palpitation, chest discomfort or lower extremity swelling Gastrointestinal:  Denies nausea, heartburn or change in bowel habits Skin: Denies abnormal skin rashes Lymphatics: Denies new lymphadenopathy or easy bruising Neurological:Denies numbness, tingling or new weaknesses Behavioral/Psych: Mood is stable, no new changes  All other systems were reviewed with the patient and are negative.  MEDICAL HISTORY:  Past Medical History:  Diagnosis Date  . Allergy   . Asthma   . Diabetes mellitus without complication ([place])   . Diverticulosis   . GERD (gastroesophageal reflux disease) 2002  . Health maintenance examination     SURGICAL HISTORY: Past Surgical History:  Procedure Laterality Date  . BIOPSY  08/30/2019   Procedure: BIOPSY;  Surgeon: James Ada, MD;  Location: [facility];  Service: Endoscopy;;  . ESOPHAGOGASTRODUODENOSCOPY (EGD) WITH PROPOFOL N/A 08/30/2019   Procedure:  ESOPHAGOGASTRODUODENOSCOPY (EGD) WITH PROPOFOL;  Surgeon: James Ada, MD;  Location: [facility];  Service: Endoscopy;  Laterality: N/A;  . ESOPHAGOGASTRODUODENOSCOPY (EGD) WITH PROPOFOL N/A 09/13/2019   Procedure: ESOPHAGOGASTRODUODENOSCOPY (EGD) WITH PROPOFOL;  Surgeon: James Ada, MD;  Location: [facility];  Service: Endoscopy;  Laterality: N/A;  . SKIN BIOPSY N/A 03/04/2019   upper back Nerofibroma and follicular cyst   . SUBMUCOSAL INJECTION  08/30/2019   Procedure: SUBMUCOSAL INJECTION;  Surgeon: James Ada, MD;  Location: [facility];  Service: Endoscopy;;  . UPPER ESOPHAGEAL ENDOSCOPIC ULTRASOUND (EUS) N/A 09/13/2019   Procedure: UPPER ESOPHAGEAL ENDOSCOPIC ULTRASOUND (EUS);  Surgeon: James Ada, MD;  Location: [facility];  Service: Endoscopy;  Laterality: N/A;    I have reviewed the social history and family history with the patient and they are unchanged from previous note.  ALLERGIES:  has No Known Allergies.  MEDICATIONS:  Current Outpatient Medications  Medication Sig Dispense Refill  . albuterol (VENTOLIN HFA) 108 (90 Base) MCG/ACT inhaler TAKE 2 PUFFS BY MOUTH EVERY 6 HOURS AS NEEDED FOR WHEEZE 18 g 0  . atorvastatin (LIPITOR) 10 MG tablet Take 10 mg by mouth daily.    Marland Kitchen lidocaine (XYLOCAINE) 2 % solution Use as directed 10 mLs in the mouth or throat every 4 (four) hours as needed. Swallow every 4 hours as needed for esophagitis. 450 mL 1  . lisinopril (ZESTRIL) 5 MG tablet Take 5 mg by mouth daily.    . metFORMIN (GLUCOPHAGE) 500 MG tablet Take 1 tablet (500 mg total) by mouth daily with breakfast. (Patient taking differently: Take 1,000 mg by mouth daily with breakfast. )  30 tablet 2  . morphine (MS CONTIN) 15 MG 12 hr tablet Take 1 tablet (15 mg total) by mouth every 12 (twelve) hours. 30 tablet 0  . oxyCODONE (OXY IR/ROXICODONE) 5 MG immediate release tablet Take 1-2 tablets (5-10 mg total) by mouth every 6 (six) hours as needed for severe pain. 30 tablet 0    . pantoprazole (PROTONIX) 40 MG tablet Take 1 tablet (40 mg total) by mouth daily. 30 tablet 0  . prochlorperazine (COMPAZINE) 10 MG tablet Take 1 tablet (10 mg total) by mouth every 6 (six) hours as needed (Nausea or vomiting). 30 tablet 1  . sucralfate (CARAFATE) 1 g tablet Take 1 tablet (1 g total) by mouth 4 (four) times daily. Dissolve each tablet in 15 cc water before use. 120 tablet 2   No current facility-administered medications for this visit.   Facility-Administered Medications Ordered in Other Visits  Medication Dose Route Frequency Provider Last Rate Last Admin  . 0.9 %  sodium chloride infusion   Intravenous Continuous James Merle, MD 500 mL/hr at 11/04/19 1144 New Bag at 11/04/19 1144  . PACLitaxel (TAXOL) 96 mg in sodium chloride 0.9 % 250 mL chemo infusion (</= 80mg /m2)  50 mg/m2 (Treatment Plan Recorded) Intravenous Once James Merle, MD 266 mL/hr at 11/04/19 1239 96 mg at 11/04/19 1239    PHYSICAL EXAMINATION: ECOG PERFORMANCE STATUS: 2 - Symptomatic, <50% confined to bed  Vitals:   11/04/19 0957  BP: 111/73  Pulse: 96  Resp: 20  Temp: 98.7 F (37.1 C)  SpO2: 100%   Filed Weights   11/04/19 0957  Weight: 161 lb 9.6 oz (73.3 kg)    Due to COVID19 we will limit examination to appearance. Patient had no complaints.  GENERAL:alert, no distress and comfortable SKIN: skin color normal, no rashes or significant lesions EYES: normal, Conjunctiva are pink and non-injected, sclera clear  NEURO: alert & oriented x 3 with fluent speech   LABORATORY DATA:  I have reviewed the data as listed CBC Latest Ref Rng & Units 11/04/2019 10/28/2019 10/21/2019  WBC 4.0 - 10.5 K/uL 1.1(L) 2.3(L) 2.6(L)  Hemoglobin 13.0 - 17.0 g/dL 9.8(L) 10.4(L) 10.8(L)  Hematocrit 39.0 - 52.0 % 28.5(L) 30.6(L) 32.1(L)  Platelets 150 - 400 K/uL 134(L) 117(L) 152     CMP Latest Ref Rng & Units 11/04/2019 10/28/2019 10/21/2019  Glucose 70 - 99 mg/dL 186(H) 183(H) 212(H)  BUN 8 - 23 mg/dL 12 12 10    Creatinine 0.61 - 1.24 mg/dL 0.90 0.92 0.93  Sodium 135 - 145 mmol/L 132(L) 136 138  Potassium 3.5 - 5.1 mmol/L 4.0 4.2 3.9  Chloride 98 - 111 mmol/L 100 98 103  CO2 22 - 32 mmol/L 24 27 26   Calcium 8.9 - 10.3 mg/dL 8.8(L) 8.8(L) 8.4(L)  Total Protein 6.5 - 8.1 g/dL 6.6 6.7 6.4(L)  Total Bilirubin 0.3 - 1.2 mg/dL 0.5 0.6 0.6  Alkaline Phos 38 - 126 U/L 55 49 45  AST 15 - 41 U/L 11(L) 11(L) 12(L)  ALT 0 - 44 U/L 12 12 14       RADIOGRAPHIC STUDIES: I have personally reviewed the radiological images as listed and agreed with the findings in the report. No results found.   ASSESSMENT & PLAN:  James Dunn is a 69 y.o. male with    1. Adenocarcinoma of distal esophagus, T3N2M0, stage IVA -His 3/12/21EGD showed a large, fungating, partially circumferential mass in the lower third of the esophagus causing dysphagia.His PET from 09/20/19 showedno evidence of  metastatic disease. -HisEUS from 09/13/19 which showedlocally advanced adenocarcinoma invading the muscular layer with 2 enlarged LNs, T3N2M0. -Idiscussed surgery is curative. Ireviewed the aggressive nature of esophageal cancer, and the high risk of recurrence after surgery.I recommended neoadjuvant CCRTto downstage his cancer before surgery. -Istarted him onstandard treatmentwithneoadjuvant concurrent ChemoRT with weekly Carboplatin and Taxol for 6 weeksbeginning 09/30/19. -S/p week 5 his pain is still preventing him to eat enough to maintain weight. I reviewed medication list and symptoms management with him. Will start him on MS Contin daily for better pain control.  -Labs reviewed, WBC 1.1, plt 134K, Hg 9.8, ANC 0.8. Overall adequate to proceed with week 6 Taxol alone today given neutropenia. Continue with IV Fluids twice a week.  -I recommend he watch for fever and signs of infection.  -Continue RT until 11/06/19 -F/u next week. He will f/u with Dr Servando Snare on 5/20.    2.Dysphagia, Odynophagiaand chest pain  secondary to chemoRT -Started early 2021, secondary to #1. His baseline weight was 185 pounds.  -He tolerates normal diet for the most part. Some meat and large pills get stucksuch as his metformin -He did not have initial significant weight loss, I do not feel he needs feeding tube at this time, but I discussed he may need tube feeding with surgery. Will monitor closely.I encouraged him to chew well and drink plenty of fluid.  -He has sublingualZofran for antiemetics during chemo. -He notes more mid chest pain up to 6-7/10 from his chemoRT. He has tried Sucralfate, Nexium and pepto without much help. Hydrocodone was no longer controlling his pain.  -He has been trying to manage liquid and soft foods diet along with 2-3 Glucerna daily, but finds it difficult due to pain.  -He is still trying eat without tube feeding placement.  -He has Protonix but finds Mylanta helps temporarily reduce pain. I increased his pain medication to Oxycodone 5mg  1-2 tabs q6hours as needed. He plans to start today on 11/04/19. I will also call in 15mg  MS Contin for extended release (11/04/19). His pain pain is during the day, so he will take once daily in the morning. I advised him to watch for drowsiness and constipation with higher dose medications.  -He was given Lidocaine to take immediately before eating by Dr. Lisbeth Dunn.  -I again recommend he eats within 30-60 minutes of taking pain medication. I discussed with pain medication increase he needs to watch for constipation.  -He will continue to f/u with Dietician, next visit today  -We also discussed feeding tube placement, he declined.  I think he will eat better if his pain is better controlled.   3.H/oTobacco and alcohol use -He has long term tobacco abuse, quit in 03/2019.He notes he has cut out alcohol recently (09/23/19). I recommend he continue cessation.   4. HTN, HL, DM, GERD -Managed by PCP, on lisinopril, atorvastatin, and metformin -His DM is not  well controlled. He notes he may not continue to swallow his500mg metformin soon. I discussed he may need to switch to insulin to control his DM. He is apprehensive about this.  -He does have acid reflux which has been flaring lately. I previously called in Protonix (10/21/19).  -I recommended he monitor his BG at least 1-2 times and to reduce sugar in dietgiven steroids during treatment.  -I discussed if his metformin is not slow release he can take 500mg  Metformin twice a day. -BP improved after He has less intake. I recommend IV fluids today. I also recommend he watch  for dehydration if he Golfs.    PLAN: -I called in [place] Contin 15mg  daily today, he can start Oxycodone as need for breakthrough pain also -Labs reviewed and adequate to proceed with week 6 Taxol today given neutropenia, will hold on carboplatin.  -Continue Radiation until 11/06/19 -IV Fluids today and 5/22, 5/24, 5/27 -Lab and F/u in 1 week -I asked our GI navigator Malachy Mood to call his wife to watch him at home and call us if needed     No problem-specific Assessment & Plan notes found for this encounter.   No orders of the defined types were placed in this encounter.  All questions were answered. The patient knows to call the clinic with any problems, questions or concerns. No barriers to learning was detected. The total time spent in the appointment was 30 minutes.     James Merle, MD 11/04/2019   I, Joslyn Devon, am acting as scribe for James Merle, MD.   I have reviewed the above documentation for accuracy and completeness, and I agree with the above.

## 2019-11-04 ENCOUNTER — Inpatient Hospital Stay (HOSPITAL_BASED_OUTPATIENT_CLINIC_OR_DEPARTMENT_OTHER): Payer: PPO | Admitting: Hematology

## 2019-11-04 ENCOUNTER — Inpatient Hospital Stay: Payer: PPO | Admitting: Nutrition

## 2019-11-04 ENCOUNTER — Ambulatory Visit
Admission: RE | Admit: 2019-11-04 | Discharge: 2019-11-04 | Disposition: A | Discharge status: Home / Self Care | Payer: PPO | Source: Ambulatory Visit | Attending: Radiation Oncology | Admitting: Radiation Oncology

## 2019-11-04 ENCOUNTER — Other Ambulatory Visit: Payer: Self-pay

## 2019-11-04 ENCOUNTER — Encounter: Payer: PPO | Admitting: Nutrition

## 2019-11-04 ENCOUNTER — Inpatient Hospital Stay: Payer: PPO

## 2019-11-04 ENCOUNTER — Encounter: Payer: Self-pay | Admitting: Hematology

## 2019-11-04 VITALS — BP 111/73 | HR 96 | Temp 98.7°F | Resp 20 | Ht 67.0 in | Wt 161.6 lb

## 2019-11-04 DIAGNOSIS — Z5111 Encounter for antineoplastic chemotherapy: Secondary | ICD-10-CM | POA: Diagnosis not present

## 2019-11-04 DIAGNOSIS — C155 Malignant neoplasm of lower third of esophagus: Secondary | ICD-10-CM

## 2019-11-04 DIAGNOSIS — E119 Type 2 diabetes mellitus without complications: Secondary | ICD-10-CM

## 2019-11-04 DIAGNOSIS — Z510 Encounter for antineoplastic radiation therapy: Secondary | ICD-10-CM | POA: Diagnosis not present

## 2019-11-04 DIAGNOSIS — E860 Dehydration: Secondary | ICD-10-CM

## 2019-11-04 LAB — CEA (IN HOUSE-CHCC): CEA (CHCC-In House): 7.74 ng/mL — ABNORMAL HIGH (ref 0.00–5.00)

## 2019-11-04 LAB — IRON AND TIBC
Iron: 53 ug/dL (ref 42–163)
Saturation Ratios: 17 % — ABNORMAL LOW (ref 20–55)
TIBC: 308 ug/dL (ref 202–409)
UIBC: 255 ug/dL (ref 117–376)

## 2019-11-04 LAB — CBC WITH DIFFERENTIAL (CANCER CENTER ONLY)
Abs Immature Granulocytes: 0.01 10*3/uL (ref 0.00–0.07)
Basophils Absolute: 0 10*3/uL (ref 0.0–0.1)
Basophils Relative: 1 %
Eosinophils Absolute: 0 10*3/uL (ref 0.0–0.5)
Eosinophils Relative: 3 %
HCT: 28.5 % — ABNORMAL LOW (ref 39.0–52.0)
Hemoglobin: 9.8 g/dL — ABNORMAL LOW (ref 13.0–17.0)
Immature Granulocytes: 1 %
Lymphocytes Relative: 9 %
Lymphs Abs: 0.1 10*3/uL — ABNORMAL LOW (ref 0.7–4.0)
MCH: 30.5 pg (ref 26.0–34.0)
MCHC: 34.4 g/dL (ref 30.0–36.0)
MCV: 88.8 fL (ref 80.0–100.0)
Monocytes Absolute: 0.1 10*3/uL (ref 0.1–1.0)
Monocytes Relative: 9 %
Neutro Abs: 0.8 10*3/uL — ABNORMAL LOW (ref 1.7–7.7)
Neutrophils Relative %: 77 %
Platelet Count: 134 10*3/uL — ABNORMAL LOW (ref 150–400)
RBC: 3.21 MIL/uL — ABNORMAL LOW (ref 4.22–5.81)
RDW: 13.4 % (ref 11.5–15.5)
WBC Count: 1.1 10*3/uL — ABNORMAL LOW (ref 4.0–10.5)
nRBC: 0 % (ref 0.0–0.2)

## 2019-11-04 LAB — CMP (CANCER CENTER ONLY)
ALT: 12 U/L (ref 0–44)
AST: 11 U/L — ABNORMAL LOW (ref 15–41)
Albumin: 3.6 g/dL (ref 3.5–5.0)
Alkaline Phosphatase: 55 U/L (ref 38–126)
Anion gap: 8 (ref 5–15)
BUN: 12 mg/dL (ref 8–23)
CO2: 24 mmol/L (ref 22–32)
Calcium: 8.8 mg/dL — ABNORMAL LOW (ref 8.9–10.3)
Chloride: 100 mmol/L (ref 98–111)
Creatinine: 0.9 mg/dL (ref 0.61–1.24)
GFR, Est AFR Am: >60 mL/min (ref >60)
GFR, Estimated: >60 mL/min (ref >60)
Glucose, Bld: 186 mg/dL — ABNORMAL HIGH (ref 70–99)
Potassium: 4 mmol/L (ref 3.5–5.1)
Sodium: 132 mmol/L — ABNORMAL LOW (ref 135–145)
Total Bilirubin: 0.5 mg/dL (ref 0.3–1.2)
Total Protein: 6.6 g/dL (ref 6.5–8.1)

## 2019-11-04 LAB — FERRITIN: Ferritin: 558 ng/mL — ABNORMAL HIGH (ref 24–336)

## 2019-11-04 MED ORDER — DEXAMETHASONE SODIUM PHOSPHATE 10 MG/ML IJ SOLN
INTRAMUSCULAR | Status: AC
  Filled 2019-11-04: qty 1

## 2019-11-04 MED ORDER — DIPHENHYDRAMINE HCL 50 MG/ML IJ SOLN
INTRAMUSCULAR | Status: AC
  Filled 2019-11-04: qty 1

## 2019-11-04 MED ORDER — FAMOTIDINE IN NACL 20-0.9 MG/50ML-% IV SOLN
INTRAVENOUS | Status: AC
  Filled 2019-11-04: qty 50

## 2019-11-04 MED ORDER — FAMOTIDINE IN NACL 20-0.9 MG/50ML-% IV SOLN
20.0000 mg | Freq: Once | INTRAVENOUS | Status: AC
  Administered 2019-11-04: 20 mg via INTRAVENOUS

## 2019-11-04 MED ORDER — SODIUM CHLORIDE 0.9 % IV SOLN
INTRAVENOUS | Status: AC
  Filled 2019-11-04 (×2): qty 250

## 2019-11-04 MED ORDER — DIPHENHYDRAMINE HCL 50 MG/ML IJ SOLN
25.0000 mg | Freq: Once | INTRAMUSCULAR | Status: AC
  Administered 2019-11-04: 25 mg via INTRAVENOUS

## 2019-11-04 MED ORDER — SODIUM CHLORIDE 0.9 % IV SOLN
50.0000 mg/m2 | Freq: Once | INTRAVENOUS | Status: AC
  Administered 2019-11-04: 96 mg via INTRAVENOUS
  Filled 2019-11-04: qty 16

## 2019-11-04 MED ORDER — PALONOSETRON HCL INJECTION 0.25 MG/5ML: 0.2500 mg | Freq: Once | INTRAVENOUS | Status: DC

## 2019-11-04 MED ORDER — MORPHINE SULFATE ER 15 MG PO TBCR: 15.0000 mg | EXTENDED_RELEASE_TABLET | Freq: Two times a day (BID) | ORAL | 0 refills | Status: DC

## 2019-11-04 MED ORDER — DEXAMETHASONE SODIUM PHOSPHATE 10 MG/ML IJ SOLN
6.0000 mg | Freq: Once | INTRAMUSCULAR | Status: AC
  Administered 2019-11-04: 6 mg via INTRAVENOUS

## 2019-11-04 NOTE — Progress Notes (Signed)
Per Dr. [name], will keep Taxol dose at 50 mg/m2 without Carbo today.  Kennith Center, Pharm.D., CPP 11/04/2019@11 :26 AM

## 2019-11-04 NOTE — Progress Notes (Signed)
Nutrition Follow-up:  Patient with esophageal cancer followed by Dr. [name].    Met with patient in infusion.  Patient reports continued pain with swallowing.  Reports that Dr. [name] is changing his pain medications. Reports that he has been able to get down cereal but still causes pain.  Played golf yesterday and drank water and ate 1/2 cheese sandwich.  Later in the evening ate 2/3rd fish sandwich.  Also ate ice cream.  Tried the shakes but reports they are hard to get down.  Has a blender and magic bullet but reports everything causes pain and is hard to get down.  "I am trying the best I can."  Feeding tube has been discussed.  Medications: reviewed  Labs: Na 132, glucose 186  Anthropometrics:   Weight 161 lb 9.6 today decreased from 167 lb on 5/10  NUTRITION DIAGNOSIS: Unintentional weight loss continues   INTERVENTION:  Patient denied needing more oral nutrition supplements or coupons. Encouraged patient to take pain medications as prescribed. Contact information provided    MONITORING, EVALUATION, GOAL: Patient will increase calories and protein to minimize further weight loss   NEXT VISIT:  June 7th phone f/u  Aylene Acoff B. Zenia Resides, [place], [place] Registered Dietitian [phone] (pager)

## 2019-11-04 NOTE — Patient Instructions (Signed)
[facility] Discharge Instructions for Patients Receiving Chemotherapy  Today you received the following chemotherapy agents:  Taxol.  To help prevent nausea and vomiting after your treatment, we encourage you to take your nausea medication as directed.   If you develop nausea and vomiting that is not controlled by your nausea medication, call the clinic.   BELOW ARE SYMPTOMS THAT SHOULD BE REPORTED IMMEDIATELY:  *FEVER GREATER THAN 100.5 F  *CHILLS WITH OR WITHOUT FEVER  NAUSEA AND VOMITING THAT IS NOT CONTROLLED WITH YOUR NAUSEA MEDICATION  *UNUSUAL SHORTNESS OF BREATH  *UNUSUAL BRUISING OR BLEEDING  TENDERNESS IN MOUTH AND THROAT WITH OR WITHOUT PRESENCE OF ULCERS  *URINARY PROBLEMS  *BOWEL PROBLEMS  UNUSUAL RASH Items with * indicate a potential emergency and should be followed up as soon as possible.  Feel free to call the clinic should you have any questions or concerns. The clinic phone number is [phone].  Please show the CHEMO ALERT CARD at check-in to the Emergency Department and triage nurse.   

## 2019-11-04 NOTE — Progress Notes (Signed)
Per Dr. [name], okay to treat today with ANC 0.8

## 2019-11-05 ENCOUNTER — Ambulatory Visit
Admission: RE | Admit: 2019-11-05 | Discharge: 2019-11-05 | Disposition: A | Discharge status: Home / Self Care | Payer: PPO | Source: Ambulatory Visit | Attending: Radiation Oncology | Admitting: Radiation Oncology

## 2019-11-05 ENCOUNTER — Other Ambulatory Visit: Payer: Self-pay

## 2019-11-05 ENCOUNTER — Telehealth: Payer: Self-pay | Admitting: Hematology

## 2019-11-05 DIAGNOSIS — C155 Malignant neoplasm of lower third of esophagus: Secondary | ICD-10-CM | POA: Diagnosis not present

## 2019-11-05 DIAGNOSIS — Z510 Encounter for antineoplastic radiation therapy: Secondary | ICD-10-CM | POA: Diagnosis not present

## 2019-11-05 NOTE — Telephone Encounter (Signed)
Scheduled appt per 5/17 los.  Left a vm of the appt date and time,

## 2019-11-06 ENCOUNTER — Inpatient Hospital Stay: Payer: PPO

## 2019-11-06 ENCOUNTER — Other Ambulatory Visit: Payer: Self-pay

## 2019-11-06 ENCOUNTER — Encounter: Payer: Self-pay | Admitting: Radiation Oncology

## 2019-11-06 ENCOUNTER — Ambulatory Visit
Admission: RE | Admit: 2019-11-06 | Discharge: 2019-11-06 | Disposition: A | Discharge status: Home / Self Care | Payer: PPO | Source: Ambulatory Visit | Attending: Radiation Oncology | Admitting: Radiation Oncology

## 2019-11-06 VITALS — BP 104/69 | HR 79 | Temp 99.2°F | Resp 18

## 2019-11-06 DIAGNOSIS — C155 Malignant neoplasm of lower third of esophagus: Secondary | ICD-10-CM

## 2019-11-06 DIAGNOSIS — Z5111 Encounter for antineoplastic chemotherapy: Secondary | ICD-10-CM | POA: Diagnosis not present

## 2019-11-06 DIAGNOSIS — Z510 Encounter for antineoplastic radiation therapy: Secondary | ICD-10-CM | POA: Diagnosis not present

## 2019-11-06 MED ORDER — SODIUM CHLORIDE 0.9 % IV SOLN
Freq: Once | INTRAVENOUS | Status: AC
  Filled 2019-11-06: qty 250

## 2019-11-06 NOTE — Patient Instructions (Signed)

## 2019-11-07 ENCOUNTER — Other Ambulatory Visit: Payer: Self-pay | Admitting: Cardiothoracic Surgery

## 2019-11-07 ENCOUNTER — Ambulatory Visit (INDEPENDENT_AMBULATORY_CARE_PROVIDER_SITE_OTHER): Payer: PPO | Admitting: Cardiothoracic Surgery

## 2019-11-07 VITALS — BP 114/70 | HR 104 | Temp 97.8°F | Resp 20 | Ht 67.0 in | Wt 162.4 lb

## 2019-11-07 DIAGNOSIS — I712 Thoracic aortic aneurysm, without rupture: Secondary | ICD-10-CM

## 2019-11-07 DIAGNOSIS — C155 Malignant neoplasm of lower third of esophagus: Secondary | ICD-10-CM | POA: Diagnosis not present

## 2019-11-07 DIAGNOSIS — I7121 Aneurysm of the ascending aorta, without rupture: Secondary | ICD-10-CM

## 2019-11-07 NOTE — Progress Notes (Signed)
[address]       [place] [address]             [phone]                    James Dunn [place] Medical Record [id number] Date of Birth: [birth year]  Referring: [name], MD Primary Care: Denita Lung, MD Primary Cardiologist: No primary care provider on file.  Chief Complaint:    Chief Complaint  Patient presents with  . Esophageal Cancer    1 month f/u, completed Radiation yesterday    History of Present Illness:    James Dunn 69 y.o. 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  The  presented  With food hanging up especially meat when he tried to swallow up.  He also noted hiccuping with eating.  He had approximately 10 pound weight loss over the past 2 months from 185-174.  With these symptoms he underwent barium swallow upper endoscopy and was noted to have a distal esophageal mass circumferential.  Biopsy confirmed adenocarcinoma.  Esophageal ultrasound was done staged T3N3 .    The patient has been started on weekly chemotherapy and daily radiation therapy starting April 14.  His last day of weekly chemo was 5/17 last radiation therapy was on 5/19  The patient continues to take a p.o. diet but with difficulty and notes significant pain with swallowing, though he does not have obstructive symptoms.  The patient is a previous heavy smoker for more than 40 years, but notes that he quit in October 2020.  He has no prior history of hypertension myocardial infarction angina denies hyperlipidemia previous stroke peripheral vascular disease or renal vascular disease, he does note oral agent controlled diabetes./  Cancer Staging Esophageal cancer [facility]) Staging form: Esophagus - Adenocarcinoma, AJCC 8th Edition - Clinical stage from 09/13/2019: Stage IVA (cT3, cN2, cM0) - Signed by [name], MD on 09/22/2019  SURGICAL PATHOLOGY  CASE: [id number]  PATIENT: James Dunn  Surgical Pathology Report    Clinical History: Dysphagia / abnormal barium swallow-evaluate for  malignancy (cm)  FINAL MICROSCOPIC DIAGNOSIS:  A. ESOPHAGUS, BIOPSY:  - At least intramucosal adenocarcinoma.  - See comment.  COMMENT:  - The depth of invasion can not be determined due to the superficial  nature of the biopsy. Dr. Vic Ripper has reviewed the case and concurs  with this interpretation. Additional studies can be performed upon  clinician request. Dr. [name] was paged on 09/03/2019.   Final Diagnosis performed by Enid Cutter, MD.  Electronically signed  09/03/2019  Current Activity/ Functional Status:  Patient is independent with mobility/ambulation, transfers, ADL's, IADL's.   Zubrod Score: At the time of surgery this patient's most appropriate activity status/level should be described as: []     0    Normal activity, no symptoms [x]     1    Restricted in physical strenuous activity but ambulatory, able to do out light work []     2    Ambulatory and capable of self care, unable to do work activities, up and about               >50 % of waking hours                              []     3    Only limited self  care, in bed greater than 50% of waking hours []     4    Completely disabled, no self care, confined to bed or chair []     5    Moribund   Past Medical History:  Diagnosis Date  . Allergy   . Asthma   . Diabetes mellitus without complication ([place])   . Diverticulosis   . GERD (gastroesophageal reflux disease) 2002  . Health maintenance examination     Past Surgical History:  Procedure Laterality Date  . BIOPSY  08/30/2019   Procedure: BIOPSY;  Surgeon: [name], MD;  Location: [facility];  Service: Endoscopy;;  . ESOPHAGOGASTRODUODENOSCOPY (EGD) WITH PROPOFOL N/A 08/30/2019   Procedure: ESOPHAGOGASTRODUODENOSCOPY (EGD) WITH PROPOFOL;  Surgeon: [name], MD;  Location: [facility];  Service: Endoscopy;  Laterality: N/A;  . ESOPHAGOGASTRODUODENOSCOPY (EGD) WITH PROPOFOL N/A  09/13/2019   Procedure: ESOPHAGOGASTRODUODENOSCOPY (EGD) WITH PROPOFOL;  Surgeon: [name], MD;  Location: [facility];  Service: Endoscopy;  Laterality: N/A;  . SKIN BIOPSY N/A 03/04/2019   upper back Nerofibroma and follicular cyst   . SUBMUCOSAL INJECTION  08/30/2019   Procedure: SUBMUCOSAL INJECTION;  Surgeon: [name], MD;  Location: [facility];  Service: Endoscopy;;  . UPPER ESOPHAGEAL ENDOSCOPIC ULTRASOUND (EUS) N/A 09/13/2019   Procedure: UPPER ESOPHAGEAL ENDOSCOPIC ULTRASOUND (EUS);  Surgeon: [name], MD;  Location: [facility];  Service: Endoscopy;  Laterality: N/A;    No family history on file.   Social History   Tobacco Use  Smoking Status Former Smoker  . Types: Cigarettes  . Quit date: 03/21/2019  . Years since quitting: 0.6  Smokeless Tobacco Never Used    Social History   Substance and Sexual Activity  Alcohol Use Not Currently  . Alcohol/week: 7.0 standard drinks  . Types: 7 Cans of beer per week     No Known Allergies  Current Outpatient Medications  Medication Sig Dispense Refill  . albuterol (VENTOLIN HFA) 108 (90 Base) MCG/ACT inhaler TAKE 2 PUFFS BY MOUTH EVERY 6 HOURS AS NEEDED FOR WHEEZE 18 g 0  . atorvastatin (LIPITOR) 10 MG tablet Take 10 mg by mouth daily.    Marland Kitchen esomeprazole (NEXIUM) 40 MG capsule Take 40 mg by mouth daily.    Marland Kitchen lidocaine (XYLOCAINE) 2 % solution Use as directed 10 mLs in the mouth or throat every 4 (four) hours as needed. Swallow every 4 hours as needed for esophagitis. 450 mL 1  . lisinopril (ZESTRIL) 5 MG tablet Take 5 mg by mouth daily.    . metFORMIN (GLUCOPHAGE) 500 MG tablet Take 1 tablet (500 mg total) by mouth daily with breakfast. (Patient taking differently: Take 1,000 mg by mouth daily with breakfast. ) 30 tablet 2  . morphine (MS CONTIN) 15 MG 12 hr tablet Take 1 tablet (15 mg total) by mouth every 12 (twelve) hours. 30 tablet 0  . oxyCODONE (OXY IR/ROXICODONE) 5 MG immediate release tablet Take 1-2  tablets (5-10 mg total) by mouth every 6 (six) hours as needed for severe pain. 30 tablet 0  . pantoprazole (PROTONIX) 40 MG tablet Take 1 tablet (40 mg total) by mouth daily. 30 tablet 0  . prochlorperazine (COMPAZINE) 10 MG tablet Take 1 tablet (10 mg total) by mouth every 6 (six) hours as needed (Nausea or vomiting). 30 tablet 1  . sucralfate (CARAFATE) 1 g tablet Take 1 tablet (1 g total) by mouth 4 (four) times daily. Dissolve each tablet in 15 cc water before use. 120 tablet 2   No  current facility-administered medications for this visit.    Pertinent items are noted in HPI.   Review of Systems:     Cardiac Review of Systems: [Y] = yes  or   [ N ] = no   Chest Pain [  n  ]  Resting SOB [  n ] Exertional SOB  n[  ]  Orthopnea [n  ]   Pedal Edema [n   ]    Palpitations Florencio.Farrier  ] Syncope  [  n]   Presyncope [ n ]   General Review of Systems: [Y] = yes [  ]=no Constitional: recent weight change [  ];  Wt loss over the last 3 months [   ] anorexia [  ]; fatigue [  ]; nausea [  ]; night sweats [  ]; fever [  ]; or chills [  ];           Eye : blurred vision [  ]; diplopia [   ]; vision changes [  ];  Amaurosis fugax[  ]; Resp: cough [  ];  wheezing[  ];  hemoptysis[  ]; shortness of breath[  ]; paroxysmal nocturnal dyspnea[  ]; dyspnea on exertion[  ]; or orthopnea[  ];  GI:  gallstones[  ], vomiting[y  ];  dysphagia[ y ]; melena[  ];  hematochezia [  ]; heartburn[ y ];   Hx of  Colonoscopy[  ]; GU: kidney stones [  ]; hematuria[  ];   dysuria [  ];  nocturia[  ];  history of     obstruction [  ]; urinary frequency [  ]             Skin: rash, swelling[  ];, hair loss[  ];  peripheral edema[  ];  or itching[  ]; Musculosketetal: myalgias[  ];  joint swelling[  ];  joint erythema[  ];  joint pain[  ];  back pain[  ];  Heme/Lymph: bruising[  ];  bleeding[  ];  anemia[  ];  Neuro: TIA[  ];  headaches[  ];  stroke[  ];  vertigo[  ];  seizures[  ];   paresthesias[  ];  difficulty walking[   ];  Psych:depression[  ]; anxiety[  ];  Endocrine: diabetes[  ];  thyroid dysfunction[  ];  Immunizations: Flu up to date Blue.Reese  ]; Pneumococcal up to date [ n ];  Other:  Wt Readings from Last 3 Encounters:  11/07/19 162 lb 6.4 oz (73.7 kg)  11/04/19 161 lb 9.6 oz (73.3 kg)  10/28/19 167 lb 3.2 oz (75.8 kg)    Pre treatment 185  PHYSICAL EXAMINATION: BP 114/70   Pulse (!) 104   Temp 97.8 F (36.6 C) (Skin)   Resp 20   Ht 5\' 7"  (1.702 m)   Wt 162 lb 6.4 oz (73.7 kg)   SpO2 99% Comment: RA  BMI 25.44 kg/m  General appearance: alert and cooperative Head: Normocephalic, without obvious abnormality, atraumatic Neck: no adenopathy, no carotid bruit, no JVD, supple, symmetrical, trachea midline and thyroid not enlarged, symmetric, no tenderness/mass/nodules Lymph nodes: Cervical, supraclavicular, and axillary nodes normal. Resp: clear to auscultation bilaterally Cardio: regular rate and rhythm, S1, S2 normal, no murmur, click, rub or gallop GI: soft, non-tender; bowel sounds normal; no masses,  no organomegaly Extremities: extremities normal, atraumatic, no cyanosis or edema and Homans sign is negative, no sign of DVT Neurologic: Grossly normal   Diagnostic Studies & Laboratory data:  Recent Radiology Findings:   CLINICAL DATA:  New diagnosis of esophageal cancer.  EXAM: CT CHEST, ABDOMEN, AND PELVIS WITH CONTRAST  TECHNIQUE: Multidetector CT imaging of the chest, abdomen and pelvis was performed following the standard protocol during bolus administration of intravenous contrast.  CONTRAST:  135mL ISOVUE-300 IOPAMIDOL (ISOVUE-300) INJECTION 61%  COMPARISON:  Barium esophagram 08/22/2019.  FINDINGS: CT CHEST FINDINGS  Cardiovascular: The heart size is normal. No substantial pericardial effusion. Coronary artery calcification is evident. Atherosclerotic calcification is noted in the wall of the thoracic aorta. Ascending thoracic aorta is 4 cm  diameter.  Mediastinum/Nodes: No mediastinal lymphadenopathy. 6 mm short axis lower paraesophageal node is visible on image 48/series 2. Soft tissue mass noted distal esophagus, compatible with known neoplasm. No axillary lymphadenopathy. There is no hilar lymphadenopathy.  Lungs/Pleura: Centrilobular and paraseptal emphysema evident. 6 mm perifissural nodule noted right middle lobe on image 100/series 4, likely a subpleural lymph node. Tiny calcified granuloma noted left lower lobe on 140/4. A cluster of granulomata in scarring in the lateral left lower lobe is compatible with prior granulomatous involvement.  Musculoskeletal: No worrisome lytic or sclerotic osseous abnormality.  CT ABDOMEN PELVIS FINDINGS  Hepatobiliary: The liver shows diffusely decreased attenuation suggesting fat deposition. Liver measures 18.2 cm craniocaudal length, enlarged. No suspicious focal lesion within the hepatic parenchyma. Gallbladder is nondistended. No intrahepatic or extrahepatic biliary dilation.  Pancreas: No focal mass lesion. No dilatation of the main duct. No intraparenchymal cyst. No peripancreatic edema.  1.9 x 1.3 cm nodular collection of soft tissues identified posterior to the descending duodenum (see image 75/series 2. This is in close proximity to the pancreatic head but a definite communication of parenchyma between these 2 structures is not discernible by CT. 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  Spleen: No splenomegaly. No focal mass lesion.  Adrenals/Urinary Tract: No adrenal nodule or mass. 7 mm hypoattenuating lesion interpolar right kidney is too small to characterize but is most likely benign. Similar 4 mm cortical hypointensity in the lower pole left kidney cannot be characterized but is likely benign. No evidence for hydroureter. The  urinary bladder appears normal for the degree of distention.  Stomach/Bowel: Stomach is unremarkable. No gastric wall thickening. No evidence of outlet obstruction. Duodenum is normally positioned as is the ligament of Treitz. No small bowel wall thickening. No small bowel dilatation. The terminal ileum is normal. The appendix is normal. Diverticular changes are noted in the left colon without evidence of diverticulitis.  Vascular/Lymphatic: There is abdominal aortic atherosclerosis without aneurysm. Left-sided IVC evident, normal variant. 15 mm short axis hepatoduodenal ligament lymph node is mildly enlarged (image 70/2. No other mediastinal lymphadenopathy although upper normal right para-aortic nodes are visible on images 73 and 75 of series 2, measuring up to 12 mm short axis. No pelvic sidewall lymphadenopathy.  Reproductive: The prostate gland and seminal vesicles are unremarkable.  Other: No intraperitoneal free fluid.  Musculoskeletal: Fatty lesion in the inferior aspect of the iliopsoas complex is compatible with lipoma. No worrisome lytic or sclerotic osseous abnormality.  IMPRESSION: 1. Soft tissue mass noted distal esophagus, compatible with known neoplasm. There is a small 6 mm short axis adjacent paraesophageal node, concerning for metastatic disease. 2. Upper normal to mildly enlarged hepatoduodenal ligament lymph node, with upper normal right para-aortic nodes. As metastatic disease cannot be excluded, PET-CT may prove helpful to further evaluate. 3. 1.9 x 1.3 cm nodular  collection of soft tissue posterior to the descending duodenum. This is in close proximity to the pancreatic head but a definite communication of parenchyma between these 2 structures is not discernible by CT. 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 Attention on follow-up recommended. 4. 6 mm perifissural nodule right middle lobe, likely a subpleural lymph  node. Attention on follow-up recommended. 5. Hepatomegaly with hepatic steatosis. 6. Ascending thoracic aorta measures 4 cm diameter. Recommend annual imaging followup by CTA or MRA. This recommendation follows 2010 ACCF/AHA/AATS/ACR/ASA/SCA/SCAI/SIR/STS/SVM Guidelines for the Diagnosis and Management of Patients with Thoracic Aortic Disease. Circulation. 2010; [id number]. Aortic aneurysm NOS (ICD10-I71.9) 7. Left-sided IVC, normal variant.   Electronically Signed   By: Misty Stanley M.D.   On: 09/05/2019 10:38   NM PET Image Initial (PI) Skull Base To Thigh  Result Date: 09/20/2019 CLINICAL DATA:  Initial treatment strategy for malignant neoplasm of distal third of esophagus. COVID vaccine in right arm on 08/19/2019 EXAM: NUCLEAR MEDICINE PET SKULL BASE TO THIGH TECHNIQUE: 8.8 mCi F-18 FDG was injected intravenously. Full-ring PET imaging was performed from the skull base to thigh after the radiotracer. CT data was obtained and used for attenuation correction and anatomic localization. Fasting blood glucose: 134 mg/dl COMPARISON:  Esophagram of 08/22/2019. Chest abdomen and pelvic CTs of 09/05/2019. FINDINGS: Mediastinal blood pool activity: SUV max 2.8 Liver activity: SUV max NA NECK: No areas of abnormal hypermetabolism. Incidental CT findings: Bilateral carotid atherosclerosis. No cervical adenopathy. Mucosal thickening of both maxillary sinuses. Fluid in both mastoid air cells. CHEST: Low-level hypermetabolism within right axillary nodes, including index node of 1.2 cm and a S.U.V. max of 3.0, likely reactive and related to recent COVID-19 vaccine. The distal esophageal primary is markedly hypermetabolic. Example at a S.U.V. max of 25.8, including on 103/4. The adjacent periesophageal node described on CT is not hypermetabolic. No pulmonary parenchymal hypermetabolism. Incidental CT findings: Centrilobular and paraseptal emphysema. Aortic and coronary artery atherosclerosis. Borderline  ascending aortic aneurysm, as on diagnostic CT. ABDOMEN/PELVIS: Multiple upper abdominal nodes demonstrate low-level, nonspecific hypermetabolism. An index retrocaval node measures 8 mm and a S.U.V. max of 2.7 on 125/4. A portal caval node measures 1.2 cm and a S.U.V. max of 3.3 on 128/4. Multifocal colonic hypermetabolism is primarily felt to be physiologic. There is focal anal hypermetabolism, slightly eccentric left, which measures a S.U.V. max of 11.9, including on 195/4. Incidental CT findings: Deferred to prior diagnostic CT. Hepatic steatosis and hepatomegaly. Left IVC. Aortic atherosclerosis. Extensive colonic diverticulosis. Lipoma within the right thigh musculature. SKELETON: No abnormal marrow activity. Incidental CT findings: none IMPRESSION: 1. Hypermetabolic distal esophageal primary. 2. Low-level hypermetabolism within upper abdominal nodes. These are technically nonspecific, but favored to be reactive in the setting of hepatic steatosis and hepatomegaly. 3. Otherwise, no evidence of metastatic disease. 4. Anal hypermetabolism could be physiologic. Consider correlation with physical exam and colonoscopy, if patient is not up-to-date. 5. Incidental findings, including: Aortic atherosclerosis (ICD10-I70.0), coronary artery atherosclerosis and emphysema (ICD10-J43.9). Sinus disease and bilateral mastoid effusions. Electronically Signed   By: Abigail Miyamoto M.D.   On: 09/20/2019 09:26     I have independently reviewed the above radiology studies  and reviewed the findings with the patient.   Recent Lab Findings: Lab Results  Component Value Date   WBC 1.1 (L) 11/04/2019   HGB 9.8 (L) 11/04/2019   HCT 28.5 (L) 11/04/2019   PLT 134 (L) 11/04/2019   GLUCOSE 186 (H) 11/04/2019   CHOL 146 11/07/2018  TRIG 202 (H) 11/07/2018   HDL 35 (L) 11/07/2018   LDLCALC 71 11/07/2018   ALT 12 11/04/2019   AST 11 (L) 11/04/2019   NA 132 (L) 11/04/2019   K 4.0 11/04/2019   CL 100 11/04/2019   CREATININE  0.90 11/04/2019   BUN 12 11/04/2019   CO2 24 11/04/2019   HGBA1C 6.7 (A) 07/08/2019   ENDOSONOGRAPHIC FINDING: Findings: A hypoechoic mass was found in the lower third of the esophagus. The mass was encountered at 36 cm from the incisors and extended to 40 cm. The lesion was circumferential. The endosonographic borders were irregular. The mass measured up to 26 mm in thickness. There was sonographic evidence suggesting invasion into the adventitia (Layer 5). The large friable distal esophageal adenocarcinoma again identified. With gentle pressure and maneuvering, both the linear and radial echoendoscopes were able to pass through the area with very little resistance. The mass was circumfirential on EUS, but there was eccentric growth. The lesion extended beyond the muscularis propria and to the adventitia (images 12 and 13). At least three distinct peritumoral lymph nodes were found measuring 7-12 mm. No other supicious lesions were found in the upper GI tract. The heterotopic pancreatic tissue noted on the CT scan appeared to be continguous with the entire pancreas. There was lobularity in this area. - A mass was found in the lower third of the esophagus. A tissue diagnosis was obtained prior to this exam. This is of adenocarcinoma. This was staged T3 N3 Mx by endosonographic criteria. - No specimens collected.   Assessment / Plan:    #1 locally advanced stage adenocarcinoma the distal esophagus-completed   weekly chemotherapy and radiation therapy- will see back in one month with repeat /restaging ct scan chest and abdomen  #2 history of long-term tobacco use-currently not smoking since October 2020 #3 oral agent controlled diabetes #4 incidental finding of mildly dilated ascending aorta 4 cm in diameter        [name] MD      [address] [place] [address] Office [phone]     11/07/2019 11:51 AM

## 2019-11-08 ENCOUNTER — Telehealth: Payer: Self-pay | Admitting: Emergency Medicine

## 2019-11-08 ENCOUNTER — Telehealth: Payer: Self-pay | Admitting: Nurse Practitioner

## 2019-11-08 NOTE — Telephone Encounter (Signed)
R/s appt per 5/21 sch msg - unable to reach pt .left message with appt date and time

## 2019-11-08 NOTE — Telephone Encounter (Signed)
Confirmed 9 am appt on 11/09/19 for IVF with patient, as well as appt times on Monday 11/11/19.

## 2019-11-09 ENCOUNTER — Other Ambulatory Visit: Payer: Self-pay

## 2019-11-09 ENCOUNTER — Inpatient Hospital Stay: Payer: PPO

## 2019-11-09 VITALS — BP 118/71 | HR 90 | Temp 98.7°F | Resp 18

## 2019-11-09 DIAGNOSIS — C155 Malignant neoplasm of lower third of esophagus: Secondary | ICD-10-CM

## 2019-11-09 DIAGNOSIS — Z5111 Encounter for antineoplastic chemotherapy: Secondary | ICD-10-CM | POA: Diagnosis not present

## 2019-11-09 MED ORDER — SODIUM CHLORIDE 0.9 % IV SOLN
Freq: Once | INTRAVENOUS | Status: AC
  Filled 2019-11-09: qty 250

## 2019-11-09 NOTE — Progress Notes (Signed)
Opened in error. Patient seen by Dr. Feng on 11/11/19.    

## 2019-11-09 NOTE — Patient Instructions (Signed)

## 2019-11-11 ENCOUNTER — Inpatient Hospital Stay: Payer: PPO

## 2019-11-11 ENCOUNTER — Inpatient Hospital Stay (HOSPITAL_BASED_OUTPATIENT_CLINIC_OR_DEPARTMENT_OTHER): Payer: PPO | Admitting: Hematology

## 2019-11-11 ENCOUNTER — Encounter: Payer: Self-pay | Admitting: Hematology

## 2019-11-11 ENCOUNTER — Other Ambulatory Visit: Payer: Self-pay

## 2019-11-11 VITALS — BP 109/75 | HR 86 | Temp 98.1°F | Resp 16

## 2019-11-11 DIAGNOSIS — F1010 Alcohol abuse, uncomplicated: Secondary | ICD-10-CM | POA: Diagnosis not present

## 2019-11-11 DIAGNOSIS — C155 Malignant neoplasm of lower third of esophagus: Secondary | ICD-10-CM | POA: Diagnosis not present

## 2019-11-11 DIAGNOSIS — Z5111 Encounter for antineoplastic chemotherapy: Secondary | ICD-10-CM | POA: Diagnosis not present

## 2019-11-11 LAB — CMP (CANCER CENTER ONLY)
ALT: 12 U/L (ref 0–44)
AST: 13 U/L — ABNORMAL LOW (ref 15–41)
Albumin: 3.4 g/dL — ABNORMAL LOW (ref 3.5–5.0)
Alkaline Phosphatase: 58 U/L (ref 38–126)
Anion gap: 7 (ref 5–15)
BUN: 11 mg/dL (ref 8–23)
CO2: 26 mmol/L (ref 22–32)
Calcium: 8.9 mg/dL (ref 8.9–10.3)
Chloride: 102 mmol/L (ref 98–111)
Creatinine: 0.87 mg/dL (ref 0.61–1.24)
GFR, Est AFR Am: >60 mL/min (ref >60)
GFR, Estimated: >60 mL/min (ref >60)
Glucose, Bld: 179 mg/dL — ABNORMAL HIGH (ref 70–99)
Potassium: 4.1 mmol/L (ref 3.5–5.1)
Sodium: 135 mmol/L (ref 135–145)
Total Bilirubin: 0.5 mg/dL (ref 0.3–1.2)
Total Protein: 6.4 g/dL — ABNORMAL LOW (ref 6.5–8.1)

## 2019-11-11 LAB — CBC WITH DIFFERENTIAL (CANCER CENTER ONLY)
Abs Immature Granulocytes: 0.02 10*3/uL (ref 0.00–0.07)
Basophils Absolute: 0 10*3/uL (ref 0.0–0.1)
Basophils Relative: 0 %
Eosinophils Absolute: 0 10*3/uL (ref 0.0–0.5)
Eosinophils Relative: 2 %
HCT: 26 % — ABNORMAL LOW (ref 39.0–52.0)
Hemoglobin: 8.9 g/dL — ABNORMAL LOW (ref 13.0–17.0)
Immature Granulocytes: 2 %
Lymphocytes Relative: 11 %
Lymphs Abs: 0.1 10*3/uL — ABNORMAL LOW (ref 0.7–4.0)
MCH: 30.6 pg (ref 26.0–34.0)
MCHC: 34.2 g/dL (ref 30.0–36.0)
MCV: 89.3 fL (ref 80.0–100.0)
Monocytes Absolute: 0.2 10*3/uL (ref 0.1–1.0)
Monocytes Relative: 16 %
Neutro Abs: 0.9 10*3/uL — ABNORMAL LOW (ref 1.7–7.7)
Neutrophils Relative %: 69 %
Platelet Count: 190 10*3/uL (ref 150–400)
RBC: 2.91 MIL/uL — ABNORMAL LOW (ref 4.22–5.81)
RDW: 14.3 % (ref 11.5–15.5)
WBC Count: 1.2 10*3/uL — ABNORMAL LOW (ref 4.0–10.5)
nRBC: 0 % (ref 0.0–0.2)

## 2019-11-11 MED ORDER — SODIUM CHLORIDE 0.9 % IV SOLN
Freq: Once | INTRAVENOUS | Status: AC
  Filled 2019-11-11: qty 250

## 2019-11-11 MED ORDER — OXYCODONE HCL 5 MG PO TABS: 5.0000 mg | ORAL_TABLET | Freq: Four times a day (QID) | ORAL | 0 refills | Status: DC | PRN

## 2019-11-11 NOTE — Patient Instructions (Signed)

## 2019-11-11 NOTE — Progress Notes (Signed)
[place]   [phone]) [phone] Fax:(336) [phone]   Clinic Follow up Note   Patient Care Team: Denita Lung, MD as PCP - General (Family Medicine) [name], MD as Consulting Physician (Gastroenterology) [name], MD as Consulting Physician (Hematology) Jonnie Finner, RN as Oncology Nurse Navigator  Date of Service:  11/11/2019  CHIEF COMPLAINT: F/u ofEsophagus cancer  SUMMARY OF ONCOLOGIC HISTORY: Oncology History  Esophageal cancer ([place])  08/30/2019 Procedure   EGD by Dr. [name] 08/30/19  IMPRESSION - Partially obstructing, malignant esophageal tumor was found in the lower third of the esophagus. Biopsied. Injected. - Normal stomach. - Normal examined duodenum.   08/30/2019 Initial Biopsy   FINAL MICROSCOPIC DIAGNOSIS:   A. ESOPHAGUS, BIOPSY:  - At least intramucosal adenocarcinoma.  - See comment.   COMMENT:   - The depth of invasion can not be determined due to the superficial  nature of the biopsy.  Dr.  Vic Ripper has reviewed the case and concurs  with this interpretation.  Additional studies can be performed upon  clinician request.    09/05/2019 Imaging   CT CAP W Contrast 09/05/19  IMPRESSION: 1. Soft tissue mass noted distal esophagus, compatible with known neoplasm. There is a small 6 mm short axis adjacent paraesophageal node, concerning for metastatic disease. 2. Upper normal to mildly enlarged hepatoduodenal ligament lymph node, with upper normal right para-aortic nodes. As metastatic disease cannot be excluded, PET-CT may prove helpful to further evaluate. 3. 1.9 x 1.3 cm nodular collection of soft tissue posterior to the descending duodenum. This is in close proximity to the pancreatic head but a definite communication of parenchyma between these 2 structures is not discernible by CT. 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 Attention on follow-up recommended. 4. 6 mm perifissural nodule  right middle lobe, likely a subpleural lymph node. Attention on follow-up recommended. 5. Hepatomegaly with hepatic steatosis. 6. Ascending thoracic aorta measures 4 cm diameter. Recommend annual imaging followup by CTA or MRA. This recommendation follows 2010 ACCF/AHA/AATS/ACR/ASA/SCA/SCAI/SIR/STS/SVM Guidelines for the Diagnosis and Management of Patients with Thoracic Aortic Disease. Circulation. 2010; [id number]. Aortic aneurysm NOS (ICD10-I71.9) 7. Left-sided IVC, normal variant.   09/11/2019 Tumor Marker   Baseline CEA 25.12 on 09/11/19   09/13/2019 Cancer Staging   Staging form: Esophagus - Adenocarcinoma, AJCC 8th Edition - Clinical stage from 09/13/2019: Stage IVA (cT3, cN2, cM0) - Signed by [name], MD on 09/22/2019   09/13/2019 Procedure   EUS by Dr. [name] 09/13/19  IMPRESSION - A mass was found in the lower third of the esophagus. A tissue diagnosis was obtained prior to this exam. This is of adenocarcinoma. This was staged T3 N3 Mx by endosonographic criteria. - No specimens collected.   09/20/2019 PET scan   PET IMPRESSION: 1. Hypermetabolic distal esophageal primary. 2. Low-level hypermetabolism within upper abdominal nodes. These are technically nonspecific, but favored to be reactive in the setting of hepatic steatosis and hepatomegaly. 3. Otherwise, no evidence of metastatic disease. 4. Anal hypermetabolism could be physiologic. Consider correlation with physical exam and colonoscopy, if patient is not up-to-date. 5. Incidental findings, including: Aortic atherosclerosis (ICD10-I70.0), coronary artery atherosclerosis and emphysema (ICD10-J43.9). Sinus disease and bilateral mastoid effusions.   09/22/2019 Initial Diagnosis   Esophageal cancer ([place])   09/30/2019 - 11/04/2019 Chemotherapy   concurrent ChemoRT with weekly Carboplatin and Taxol starting 09/30/19. Last dose was Taxol alone given neutropenia.    09/30/2019 - 11/06/2019 Radiation Therapy   concurrent  ChemoRT  with Dr. Lisbeth Renshaw starting 09/30/19-11/06/19      CURRENT THERAPY:  PENDING Surgery   INTERVAL HISTORY:  [name] is here for a follow up. He presents to the clinic alone.  He notes he has lost 2-3 more pounds. He notes he can keep everything down but still has dysphagia and odynophagia. She has tried Lidocaine and pain medication 30 minutes before eating and still has explosive pain when his food reaches esophagus. He is able to eat bowel of cereal in the morning and not much after that. He is able to have 1 Ensure a day and little water. He can do frozen ice, ice cream and jello. He notes increased oxycodone does make him drowsy. He also takes Morphine at night which gets him through the night. He notes he was able to go golfing yesterday for 4 hours and tried to stay hydrated with water. He still would like to have his Metformin dosage updated in his chart.    REVIEW OF SYSTEMS:   Constitutional: Denies fevers, chills or abnormal weight loss Eyes: Denies blurriness of vision Ears, nose, mouth, throat, and face: Denies mucositis or sore throat (+) dysphagia and Odynophagia  Respiratory: Denies cough, dyspnea or wheezes  Cardiovascular: Denies palpitation, chest discomfort or lower extremity swelling Gastrointestinal:  Denies nausea, heartburn or change in bowel habits Skin: Denies abnormal skin rashes Lymphatics: Denies new lymphadenopathy or easy bruising Neurological:Denies numbness, tingling or new weaknesses Behavioral/Psych: Mood is stable, no new changes  All other systems were reviewed with the patient and are negative.  MEDICAL HISTORY:  Past Medical History:  Diagnosis Date  . Allergy   . Asthma   . Diabetes mellitus without complication ([place])   . Diverticulosis   . GERD (gastroesophageal reflux disease) 2002  . Health maintenance examination     SURGICAL HISTORY: Past Surgical History:  Procedure Laterality Date  . BIOPSY  08/30/2019   Procedure: BIOPSY;   Surgeon: [name], MD;  Location: [facility];  Service: Endoscopy;;  . ESOPHAGOGASTRODUODENOSCOPY (EGD) WITH PROPOFOL N/A 08/30/2019   Procedure: ESOPHAGOGASTRODUODENOSCOPY (EGD) WITH PROPOFOL;  Surgeon: [name], MD;  Location: [facility];  Service: Endoscopy;  Laterality: N/A;  . ESOPHAGOGASTRODUODENOSCOPY (EGD) WITH PROPOFOL N/A 09/13/2019   Procedure: ESOPHAGOGASTRODUODENOSCOPY (EGD) WITH PROPOFOL;  Surgeon: [name], MD;  Location: [facility];  Service: Endoscopy;  Laterality: N/A;  . SKIN BIOPSY N/A 03/04/2019   upper back Nerofibroma and follicular cyst   . SUBMUCOSAL INJECTION  08/30/2019   Procedure: SUBMUCOSAL INJECTION;  Surgeon: [name], MD;  Location: [facility];  Service: Endoscopy;;  . UPPER ESOPHAGEAL ENDOSCOPIC ULTRASOUND (EUS) N/A 09/13/2019   Procedure: UPPER ESOPHAGEAL ENDOSCOPIC ULTRASOUND (EUS);  Surgeon: [name], MD;  Location: [facility];  Service: Endoscopy;  Laterality: N/A;    I have reviewed the social history and family history with the patient and they are unchanged from previous note.  ALLERGIES:  has No Known Allergies.  MEDICATIONS:  Current Outpatient Medications  Medication Sig Dispense Refill  . metFORMIN (GLUCOPHAGE) 1000 MG tablet Take 1,000 mg by mouth daily with breakfast.    . albuterol (VENTOLIN HFA) 108 (90 Base) MCG/ACT inhaler TAKE 2 PUFFS BY MOUTH EVERY 6 HOURS AS NEEDED FOR WHEEZE 18 g 0  . atorvastatin (LIPITOR) 10 MG tablet Take 10 mg by mouth daily.    Marland Kitchen esomeprazole (NEXIUM) 40 MG capsule Take 40 mg by mouth daily.    Marland Kitchen lidocaine (XYLOCAINE) 2 % solution Use as directed 10 mLs in the  mouth or throat every 4 (four) hours as needed. Swallow every 4 hours as needed for esophagitis. 450 mL 1  . lisinopril (ZESTRIL) 5 MG tablet Take 5 mg by mouth daily.    Marland Kitchen morphine (MS CONTIN) 15 MG 12 hr tablet Take 1 tablet (15 mg total) by mouth every 12 (twelve) hours. 30 tablet 0  . oxyCODONE (OXY IR/ROXICODONE) 5 MG  immediate release tablet Take 1-2 tablets (5-10 mg total) by mouth every 6 (six) hours as needed for severe pain. 60 tablet 0  . pantoprazole (PROTONIX) 40 MG tablet Take 1 tablet (40 mg total) by mouth daily. 30 tablet 0  . prochlorperazine (COMPAZINE) 10 MG tablet Take 1 tablet (10 mg total) by mouth every 6 (six) hours as needed (Nausea or vomiting). 30 tablet 1  . sucralfate (CARAFATE) 1 g tablet Take 1 tablet (1 g total) by mouth 4 (four) times daily. Dissolve each tablet in 15 cc water before use. 120 tablet 2   No current facility-administered medications for this visit.    PHYSICAL EXAMINATION: ECOG PERFORMANCE STATUS: 2 - Symptomatic, <50% confined to bed  There were no vitals filed for this visit. There were no vitals filed for this visit.  Due to [place] we will limit examination to appearance. Patient had no complaints.  GENERAL:alert, no distress and comfortable SKIN: skin color normal, no rashes or significant lesions EYES: normal, Conjunctiva are pink and non-injected, sclera clear  NEURO: alert & oriented x 3 with fluent speech   LABORATORY DATA:  I have reviewed the data as listed CBC Latest Ref Rng & Units 11/11/2019 11/04/2019 10/28/2019  WBC 4.0 - 10.5 K/uL 1.2(L) 1.1(L) 2.3(L)  Hemoglobin 13.0 - 17.0 g/dL 8.9(L) 9.8(L) 10.4(L)  Hematocrit 39.0 - 52.0 % 26.0(L) 28.5(L) 30.6(L)  Platelets 150 - 400 K/uL 190 134(L) 117(L)     CMP Latest Ref Rng & Units 11/11/2019 11/04/2019 10/28/2019  Glucose 70 - 99 mg/dL 179(H) 186(H) 183(H)  BUN 8 - 23 mg/dL 11 12 12   Creatinine 0.61 - 1.24 mg/dL 0.87 0.90 0.92  Sodium 135 - 145 mmol/L 135 132(L) 136  Potassium 3.5 - 5.1 mmol/L 4.1 4.0 4.2  Chloride 98 - 111 mmol/L 102 100 98  CO2 22 - 32 mmol/L 26 24 27   Calcium 8.9 - 10.3 mg/dL 8.9 8.8(L) 8.8(L)  Total Protein 6.5 - 8.1 g/dL 6.4(L) 6.6 6.7  Total Bilirubin 0.3 - 1.2 mg/dL 0.5 0.5 0.6  Alkaline Phos 38 - 126 U/L 58 55 49  AST 15 - 41 U/L 13(L) 11(L) 11(L)  ALT 0 - 44 U/L 12  12 12       RADIOGRAPHIC STUDIES: I have personally reviewed the radiological images as listed and agreed with the findings in the report. No results found.   ASSESSMENT & PLAN:  James Dunn is a 69 y.o. male with    1. Adenocarcinoma of distal esophagus, T3N2M0, stage IVA -His 3/12/21EGD showed a large, fungating, partially circumferential mass in the lower third of the esophagus causing dysphagia.His PET from 09/20/19 showedno evidence of metastatic disease. -HisEUS from 09/13/19 which showedlocally advanced adenocarcinoma invading the muscular layer with 2 enlarged LNs, T3N2M0. -Idiscussed surgery is curative. Ireviewed the aggressive nature of esophageal cancer, and the high risk of recurrence after surgery.I recommended neoadjuvant CCRTto downstage his cancer before surgery. -Istarted him onstandard treatmentwithneoadjuvant concurrent ChemoRT with weekly Carboplatin and Taxol for 6 weeksbeginning 09/30/19. He completed on 11/06/19.  -S/p week 6 he still has moderate to significant dysphagia  and odynophagia. He still has lower oral intake and weight still trending down. I reviewed pain medication use and encouraged him to drink insure, eat ice cream etc  -Labs reviewed, Will proceed with IV Fluids today, 5/27 and 5/31.  -He will have post treatment CT CAP on 6/17 and see Dr. Pia Mau on same day -F/u in 2 weeks    2.Dysphagia, Odynophagiaand chest pain secondary to chemoRT -Started early 2021, secondary to #1. His baseline weight was 185 pounds.  -He tolerates normal diet for the most part. Some meat and large pills get stucksuch as his metformin -He did not have initial significant weight loss, I do not feel he needs feeding tube at this time, but I discussed he may need tube feeding with surgery. Will monitor closely.I encouraged him to chew well and drink plenty of fluid.  -He has sublingualZofran for antiemetics during chemo. -He has had more mid chest pain up  to 6-7/10 from his chemoRT. He has tried Sucralfate, Nexium and pepto without much help. Hydrocodone was no longer controlling his pain.  -He has been trying to manage liquid and soft foods diet along with 2-3 Glucerna daily, but finds it difficult due to pain. -He is still trying eat without tube feeding placement.  -He has Protonix but finds Mylanta helps temporarily reduce pain. I increased his pain medication to Oxycodone 5mg  1-2 tabs q6hours as needed and 15mg  MS Contin for extended release (11/04/19). -He was given Lidocaine to take immediately before eating by Dr. Lisbeth Renshaw, which helps but not long enough to finish eating.  -I have recommended he eat within 30-60 minutes of taking pain medication. I discussed with pain medication increase he needs to watch for constipation. -We also discussed feeding tube placement, he declined. I think he will eat better if his pain is better controlled.  -He is fairly managing his pain on oxycodone and Morphine, he does not want to increase dose due to drowsiness. He will continue Lidocaine and work on increase eating the foods he can get down along with cold substances. I encouraged him this will take time to recover and to remain and hydrated. -He willcontinue to f/u with Dietician    3.H/oTobacco and alcohol use -He has long term tobacco abuse, quit in 03/2019.He notes he has cut out alcohol recently (09/23/19). I recommend he continue cessation.   4. HTN, HL, DM, GERD -Managed by PCP, on lisinopril, atorvastatin, and metformin -His DM is not well controlled. He notes he may not continue to swallow his500mg metformin soon. I discussed he may need to switch to insulin to control his DM. He is apprehensive about this.  -He does have acid reflux which has been flaring lately.Ipreviouslycalled in Protonix(10/21/19). -I recommendedhe monitor his BG at least 1-2 times and to reduce sugar in dietgiven steroids during treatment.  -I discussed if  his metformin is not slow release he can take 500mg  Metformin twice a day. -Will continue IV Fluids 1-2 times a week for now.    PLAN: -IV Fluids today and 5/27 and 5/31  -Lab and F/u in 2 weeks    No problem-specific Assessment & Plan notes found for this encounter.   No orders of the defined types were placed in this encounter.  All questions were answered. The patient knows to call the clinic with any problems, questions or concerns. No barriers to learning was detected. The total time spent in the appointment was 25 minutes.     [name], MD 11/11/2019  Oneal Deputy, am acting as scribe for [name], MD.   I have reviewed the above documentation for accuracy and completeness, and I agree with the above.

## 2019-11-12 ENCOUNTER — Telehealth: Payer: Self-pay | Admitting: Hematology

## 2019-11-12 ENCOUNTER — Encounter: Payer: Self-pay | Admitting: Cardiothoracic Surgery

## 2019-11-12 DIAGNOSIS — F1010 Alcohol abuse, uncomplicated: Secondary | ICD-10-CM | POA: Diagnosis not present

## 2019-11-12 NOTE — Telephone Encounter (Signed)
Scheduled appt per 5/24 los.  Spoke with pt, and pt did not want to schedule the 2 week f/u.  Informed the MD nurse.  Pt aware of his scheduled appt dates and time

## 2019-11-13 ENCOUNTER — Ambulatory Visit (INDEPENDENT_AMBULATORY_CARE_PROVIDER_SITE_OTHER): Payer: PPO | Admitting: Family Medicine

## 2019-11-13 ENCOUNTER — Other Ambulatory Visit: Payer: Self-pay

## 2019-11-13 ENCOUNTER — Encounter: Payer: Self-pay | Admitting: Family Medicine

## 2019-11-13 VITALS — BP 74/46 | HR 94 | Temp 97.3°F | Wt 159.2 lb

## 2019-11-13 DIAGNOSIS — E1169 Type 2 diabetes mellitus with other specified complication: Secondary | ICD-10-CM

## 2019-11-13 DIAGNOSIS — E785 Hyperlipidemia, unspecified: Secondary | ICD-10-CM

## 2019-11-13 DIAGNOSIS — R1310 Dysphagia, unspecified: Secondary | ICD-10-CM

## 2019-11-13 DIAGNOSIS — C155 Malignant neoplasm of lower third of esophagus: Secondary | ICD-10-CM

## 2019-11-13 DIAGNOSIS — E119 Type 2 diabetes mellitus without complications: Secondary | ICD-10-CM

## 2019-11-13 LAB — POCT GLYCOSYLATED HEMOGLOBIN (HGB A1C): Hemoglobin A1C: 7.9 % — AB (ref 4.0–5.6)

## 2019-11-13 MED ORDER — METFORMIN HCL 500 MG PO TABS: 500.0000 mg | ORAL_TABLET | Freq: Two times a day (BID) | ORAL | 1 refills | Status: DC

## 2019-11-13 NOTE — Progress Notes (Signed)
Subjective:    Patient ID: James Dunn, male    DOB: [birth year], 69 y.o.   MRN: [id number]  James Dunn is a 69 y.o. male who presents for follow-up of Type 2 diabetes mellitus.  Home blood sugar records: meter record 100- 220 fasting and post meal Current symptoms/problems include none at this time. Daily foot checks: yes   Any foot concerns: none  Exercise: staying active  Diet: not good due to throat cancer.  He finished radiation about a week ago.  He is now using Carafate once or twice per day and also Xylocaine to help with swallowing but still finds this quite difficult.  He is getting regular IV fluids to help with this.  He continues on Metformin but is using the 500 mg which is easier to swallow.  He is also taking atorvastatin.  He continues on MS Contin but is usually using this mainly at night because it makes him drowsy during the day.  Overall he seems to be handling this as well as can be expected and is quite angry over the whole situation.  I would validated his thoughts and feelings concerning this. The following portions of the patient's history were reviewed and updated as appropriate: allergies, current medications, past medical history, past social history and problem list.  ROS as in subjective above.     Objective:    Physical Exam Alert and in no distress otherwise not examined.   Hemoglobin A1c is 7.9 Lab Review Diabetic Labs Latest Ref Rng & Units 11/11/2019 11/04/2019 10/28/2019 10/21/2019 10/14/2019  HbA1c 4.0 - 5.6 % - - - - -  Microalbumin mg/L - - - - -  Micro/Creat Ratio - - - - - -  Chol 100 - 199 mg/dL - - - - -  HDL >39 mg/dL - - - - -  Calc LDL 0 - 99 mg/dL - - - - -  Triglycerides 0 - 149 mg/dL - - - - -  Creatinine 0.61 - 1.24 mg/dL 0.87 0.90 0.92 0.93 0.98   BP/Weight 11/11/2019 11/09/2019 11/07/2019 11/06/2019 [id number]  Systolic BP 0000000 [id number]  Diastolic BP 75 71 70 69 73  Wt. (Lbs) - - 162.4 - 161.6  BMI - - 25.44 - 25.31   Foot/eye  exam completion dates Latest Ref Rng & Units 08/02/2017 01/16/2017  Eye Exam No Retinopathy - No Retinopathy  Foot Form Completion - Done -    James Dunn  reports that he quit smoking about 7 months ago. His smoking use included cigarettes. He has never used smokeless tobacco. He reports previous alcohol use of about 7.0 standard drinks of alcohol per week. He reports that he does not use drugs.     Assessment & Plan:    Controlled type 2 diabetes mellitus without complication, without long-term current use of insulin ([place]) - Plan: metFORMIN (GLUCOPHAGE) 500 MG tablet  Dysphagia, unspecified type  Hyperlipidemia associated with type 2 diabetes mellitus ([place])  Malignant neoplasm of lower third of esophagus ([place])   1. Rx changes: Stop lisinopril.  Use the Carafate 4 times per day and also continue on Xylocaine. 2. Education: Reviewed 'ABCs' of diabetes management (respective goals in parentheses):  A1C (<7), blood pressure (<130/80), and cholesterol (LDL <100). 3. Compliance at present is estimated to be fair.  Under the circumstances that is as good as it gets. 4. Follow up: 4 months I reinforced the fact that it is okay for him to be very upset and  mad over the whole situation.  Encouraged him to call me if he has any questions about any of his care and I would gladly run interference for him.

## 2019-11-13 NOTE — Addendum Note (Signed)
Addended by: [name] on: 11/13/2019 12:13 PM   Modules accepted: Orders

## 2019-11-13 NOTE — Patient Instructions (Signed)
Take the Carafate 4 times per day and keep using the Xylocaine Stop the lisinopril Take the Metformin twice per day

## 2019-11-14 ENCOUNTER — Inpatient Hospital Stay: Payer: PPO

## 2019-11-14 ENCOUNTER — Other Ambulatory Visit: Payer: Self-pay

## 2019-11-14 VITALS — BP 98/61 | HR 89 | Temp 98.7°F | Resp 18

## 2019-11-14 DIAGNOSIS — C155 Malignant neoplasm of lower third of esophagus: Secondary | ICD-10-CM

## 2019-11-14 DIAGNOSIS — F1010 Alcohol abuse, uncomplicated: Secondary | ICD-10-CM | POA: Diagnosis not present

## 2019-11-14 DIAGNOSIS — Z5111 Encounter for antineoplastic chemotherapy: Secondary | ICD-10-CM | POA: Diagnosis not present

## 2019-11-14 MED ORDER — ALTEPLASE 2 MG IJ SOLR
2.0000 mg | Freq: Once | INTRAMUSCULAR | Status: DC | PRN
  Filled 2019-11-14: qty 2

## 2019-11-14 MED ORDER — SODIUM CHLORIDE 0.9 % IV SOLN
Freq: Once | INTRAVENOUS | Status: AC
  Filled 2019-11-14: qty 250

## 2019-11-14 MED ORDER — SODIUM CHLORIDE 0.9% FLUSH
10.0000 mL | Freq: Once | INTRAVENOUS | Status: AC | PRN
  Administered 2019-11-14: 10 mL
  Filled 2019-11-14: qty 10

## 2019-11-19 ENCOUNTER — Encounter: Payer: Self-pay | Admitting: Nutrition

## 2019-11-19 DIAGNOSIS — F1010 Alcohol abuse, uncomplicated: Secondary | ICD-10-CM | POA: Diagnosis not present

## 2019-11-19 NOTE — Progress Notes (Signed)
Patient was scheduled for telephone follow-up today.  Noted patient called and canceled nutrition appointment and did not want to reschedule.

## 2019-11-20 ENCOUNTER — Other Ambulatory Visit: Payer: Self-pay | Admitting: Hematology

## 2019-11-20 ENCOUNTER — Inpatient Hospital Stay: Payer: PPO | Attending: Nurse Practitioner

## 2019-11-20 ENCOUNTER — Other Ambulatory Visit: Payer: Self-pay

## 2019-11-20 VITALS — BP 94/62 | HR 68 | Temp 99.0°F | Resp 18

## 2019-11-20 DIAGNOSIS — C155 Malignant neoplasm of lower third of esophagus: Secondary | ICD-10-CM

## 2019-11-20 MED ORDER — SODIUM CHLORIDE 0.9 % IV SOLN
Freq: Once | INTRAVENOUS | Status: AC
  Filled 2019-11-20: qty 250

## 2019-11-20 NOTE — Patient Instructions (Signed)

## 2019-11-21 DIAGNOSIS — F1010 Alcohol abuse, uncomplicated: Secondary | ICD-10-CM | POA: Diagnosis not present

## 2019-11-25 ENCOUNTER — Encounter: Payer: PPO | Admitting: Nutrition

## 2019-11-26 DIAGNOSIS — F1010 Alcohol abuse, uncomplicated: Secondary | ICD-10-CM | POA: Diagnosis not present

## 2019-11-27 ENCOUNTER — Other Ambulatory Visit: Payer: Self-pay

## 2019-11-27 ENCOUNTER — Inpatient Hospital Stay: Payer: PPO

## 2019-11-27 VITALS — BP 107/71 | HR 71 | Temp 98.6°F | Resp 17

## 2019-11-27 DIAGNOSIS — C155 Malignant neoplasm of lower third of esophagus: Secondary | ICD-10-CM

## 2019-11-27 MED ORDER — SODIUM CHLORIDE 0.9 % IV SOLN
Freq: Once | INTRAVENOUS | Status: AC
  Filled 2019-11-27: qty 250

## 2019-11-27 NOTE — Patient Instructions (Signed)

## 2019-11-28 DIAGNOSIS — F1010 Alcohol abuse, uncomplicated: Secondary | ICD-10-CM | POA: Diagnosis not present

## 2019-12-03 DIAGNOSIS — F1010 Alcohol abuse, uncomplicated: Secondary | ICD-10-CM | POA: Diagnosis not present

## 2019-12-05 ENCOUNTER — Ambulatory Visit
Admission: RE | Admit: 2019-12-05 | Discharge: 2019-12-05 | Disposition: A | Discharge status: Home / Self Care | Payer: PPO | Source: Ambulatory Visit | Attending: Cardiothoracic Surgery | Admitting: Cardiothoracic Surgery

## 2019-12-05 ENCOUNTER — Encounter: Payer: Self-pay | Admitting: *Deleted

## 2019-12-05 ENCOUNTER — Other Ambulatory Visit: Payer: Self-pay

## 2019-12-05 ENCOUNTER — Ambulatory Visit (INDEPENDENT_AMBULATORY_CARE_PROVIDER_SITE_OTHER): Payer: PPO | Admitting: Cardiothoracic Surgery

## 2019-12-05 ENCOUNTER — Other Ambulatory Visit: Payer: Self-pay | Admitting: *Deleted

## 2019-12-05 ENCOUNTER — Encounter: Payer: Self-pay | Admitting: Cardiothoracic Surgery

## 2019-12-05 VITALS — BP 116/68 | HR 78 | Temp 97.7°F | Resp 20 | Ht 67.0 in | Wt 158.4 lb

## 2019-12-05 DIAGNOSIS — C155 Malignant neoplasm of lower third of esophagus: Secondary | ICD-10-CM

## 2019-12-05 DIAGNOSIS — C159 Malignant neoplasm of esophagus, unspecified: Secondary | ICD-10-CM

## 2019-12-05 DIAGNOSIS — F1010 Alcohol abuse, uncomplicated: Secondary | ICD-10-CM | POA: Diagnosis not present

## 2019-12-05 MED ORDER — IOPAMIDOL (ISOVUE-300) INJECTION 61%
100.0000 mL | Freq: Once | INTRAVENOUS | Status: AC | PRN
  Administered 2019-12-05: 100 mL via INTRAVENOUS

## 2019-12-05 NOTE — Progress Notes (Signed)
James Dunn       James Dunn,[place] [address]             [phone]                    James Dunn James Dunn Date of Birth: February 17, 1951  Referring: James Merle, MD Primary Care: James Lung, MD Primary Cardiologist: No primary care provider on file.  Chief Complaint:    Chief Complaint  Patient presents with  . Esophageal Cancer    f/u with chest CT for restaging    History of Present Illness:    James Dunn 69 y.o. 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  He   presented  With  food hanging up especially meat when he tried to swallow up.  He also noted hiccuping with eating.  He had approximately 10 pound weight loss over the past 2 months from 185-174.  With these symptoms he underwent barium swallow upper endoscopy and was noted to have a distal esophageal mass circumferential.  Biopsy confirmed adenocarcinoma.  Esophageal ultrasound was done staged T3N3 .    The patient  started on weekly chemotherapy and daily radiation therapy starting April 14.  His last day of weekly chemo was 5/17 last radiation therapy was on 5/19  The patient is taking p.o. diet relatively well, pain with swallowing has markedly improved.  He notes he was able to eat steak without feeling like food sticking.  The patient is a previous heavy smoker for more than 40 years, but notes that he quit in October 2020.  He has no prior history of hypertension myocardial infarction angina denies hyperlipidemia previous stroke peripheral vascular disease or renal vascular disease, he does note oral agent controlled diabetes.  CT scans noted significant left main calcification coronary artery calcifications.  Cancer Staging Esophageal cancer [facility]) Staging form: Esophagus - Adenocarcinoma, AJCC 8th Edition - Clinical stage from 09/13/2019: Stage IVA (cT3, cN2, cM0) - Signed by James Merle, MD on 09/22/2019  SURGICAL PATHOLOGY    CASE: [id number]  PATIENT: James Dunn  Surgical Pathology Report  Clinical History: Dysphagia / abnormal barium swallow-evaluate for  malignancy (cm)  FINAL MICROSCOPIC DIAGNOSIS:  A. ESOPHAGUS, BIOPSY:  - At least intramucosal adenocarcinoma.  - See comment.  COMMENT:  - The depth of invasion can not be determined due to the superficial  nature of the biopsy. Dr. Vic Dunn has reviewed the case and concurs  with this interpretation. Additional studies can be performed upon  clinician request. Dr. Benson Dunn was paged on 09/03/2019.   Final Diagnosis performed by James Cutter, MD.  Electronically signed  09/03/2019  Current Activity/ Functional Status:  Patient is independent with mobility/ambulation, transfers, ADL's, IADL's.   Zubrod Score: At the time of surgery this patient's most appropriate activity status/level should be described as: []     0    Normal activity, no symptoms [x]     1    Restricted in physical strenuous activity but ambulatory, able to do out light work []     2    Ambulatory and capable of self care, unable to do work activities, up and about               >50 % of waking hours                              []   3    Only limited self care, in bed greater than 50% of waking hours []     4    Completely disabled, no self care, confined to bed or chair []     5    Moribund   Past Medical History:  Diagnosis Date  . Allergy   . Asthma   . Diabetes mellitus without complication ([place])   . Diverticulosis   . GERD (gastroesophageal reflux disease) 2002  . Health maintenance examination     Past Surgical History:  Procedure Laterality Date  . BIOPSY  08/30/2019   Procedure: BIOPSY;  Surgeon: James Ada, MD;  Location: [facility];  Service: Endoscopy;;  . ESOPHAGOGASTRODUODENOSCOPY (EGD) WITH PROPOFOL N/A 08/30/2019   Procedure: ESOPHAGOGASTRODUODENOSCOPY (EGD) WITH PROPOFOL;  Surgeon: James Ada, MD;  Location: [facility];  Service: Endoscopy;   Laterality: N/A;  . ESOPHAGOGASTRODUODENOSCOPY (EGD) WITH PROPOFOL N/A 09/13/2019   Procedure: ESOPHAGOGASTRODUODENOSCOPY (EGD) WITH PROPOFOL;  Surgeon: James Ada, MD;  Location: [facility];  Service: Endoscopy;  Laterality: N/A;  . SKIN BIOPSY N/A 03/04/2019   upper back Nerofibroma and follicular cyst   . SUBMUCOSAL INJECTION  08/30/2019   Procedure: SUBMUCOSAL INJECTION;  Surgeon: James Ada, MD;  Location: [facility];  Service: Endoscopy;;  . UPPER ESOPHAGEAL ENDOSCOPIC ULTRASOUND (EUS) N/A 09/13/2019   Procedure: UPPER ESOPHAGEAL ENDOSCOPIC ULTRASOUND (EUS);  Surgeon: James Ada, MD;  Location: [facility];  Service: Endoscopy;  Laterality: N/A;    No family history on file.   Social History   Tobacco Use  Smoking Status Former Smoker  . Types: Cigarettes  . Quit date: 03/21/2019  . Years since quitting: 0.7  Smokeless Tobacco Never Used    Social History   Substance and Sexual Activity  Alcohol Use Not Currently  . Alcohol/week: 7.0 standard drinks  . Types: 7 Cans of beer per week     No Known Allergies  Current Outpatient Medications  Medication Sig Dispense Refill  . albuterol (VENTOLIN HFA) 108 (90 Base) MCG/ACT inhaler TAKE 2 PUFFS BY MOUTH EVERY 6 HOURS AS NEEDED FOR WHEEZE 18 g 0  . atorvastatin (LIPITOR) 10 MG tablet Take 10 mg by mouth daily.    Marland Kitchen esomeprazole (NEXIUM) 40 MG capsule Take 40 mg by mouth daily.    Marland Kitchen lidocaine (XYLOCAINE) 2 % solution Use as directed 10 mLs in the mouth or throat every 4 (four) hours as needed. Swallow every 4 hours as needed for esophagitis. 450 mL 1  . metFORMIN (GLUCOPHAGE) 500 MG tablet Take 1 tablet (500 mg total) by mouth 2 (two) times daily with a meal. 180 tablet 1  . morphine (MS CONTIN) 15 MG 12 hr tablet Take 1 tablet (15 mg total) by mouth every 12 (twelve) hours. 30 tablet 0  . oxyCODONE (OXY IR/ROXICODONE) 5 MG immediate release tablet Take 1-2 tablets (5-10 mg total) by mouth every 6 (six) hours as  needed for severe pain. 60 tablet 0  . pantoprazole (PROTONIX) 40 MG tablet TAKE 1 TABLET BY MOUTH EVERY DAY 30 tablet 3  . prochlorperazine (COMPAZINE) 10 MG tablet Take 1 tablet (10 mg total) by mouth every 6 (six) hours as needed (Nausea or vomiting). 30 tablet 1  . sucralfate (CARAFATE) 1 g tablet Take 1 tablet (1 g total) by mouth 4 (four) times daily. Dissolve each tablet in 15 cc water before use. 120 tablet 2   No current facility-administered medications for this visit.    Pertinent items are noted in HPI.   Review  of Systems:     Cardiac Review of Systems: [Y] = yes  or   [ N ] = no   Chest Pain [  N ]  Resting SOB [  N ] Exertional SOB  [  N]  Orthopnea Aqua.Slicker ]   Pedal Edema Aqua.Slicker   ]    Palpitations Aqua.Slicker ] Syncope  [  N   Presyncope [ N]   General Review of Systems: [Y] = yes [  ]=no Constitional: recent weight change [  ];  Wt loss over the last 3 months [   ] anorexia [  ]; fatigue [  ]; nausea [  ]; night sweats [  ]; fever [  ]; or chills [  ];           Eye : blurred vision [  ]; diplopia [   ]; vision changes [  ];  Amaurosis fugax[  ]; Resp: cough [  ];  wheezing[  ];  hemoptysis[  ]; shortness of breath[  ]; paroxysmal nocturnal dyspnea[  ]; dyspnea on exertion[  ]; or orthopnea[  ];  GI:  gallstones[  ], vomiting[Y  ];  dysphagia[ Y ]; melena[  ];  hematochezia [  ]; heartburn[ Y ];   Hx of  Colonoscopy[  ]; GU: kidney stones [  ]; hematuria[  ];   dysuria [  ];  nocturia[  ];  history of     obstruction [  ]; urinary frequency [  ]             Skin: rash, swelling[  ];, hair loss[  ];  peripheral edema[  ];  or itching[  ]; Musculosketetal: myalgias[  ];  joint swelling[  ];  joint erythema[  ];  joint pain[  ];  back pain[  ];  Heme/Lymph: bruising[  ];  bleeding[  ];  anemia[  ];  Neuro: TIA[  ];  headaches[  ];  stroke[  ];  vertigo[  ];  seizures[  ];   paresthesias[  ];  difficulty walking[  ];  Psych:depression[  ]; anxiety[  ];  Endocrine: diabetes[  ];  thyroid  dysfunction[  ];  Immunizations: Flu up to date Blue.Reese  ]; Pneumococcal up to date [ n ];  Other:  Wt Readings from Last 3 Encounters:  12/05/19 158 lb 6.4 oz (71.8 kg)  11/13/19 159 lb 3.2 oz (72.2 kg)  11/07/19 162 lb 6.4 oz (73.7 kg)    Pre treatment 185  PHYSICAL EXAMINATION: BP 116/68 (BP Location: Right Arm, Patient Position: Sitting, Cuff Size: Normal)   Pulse 78   Temp 97.7 F (36.5 C) (Temporal)   Resp 20   Ht 5\' 7"  (1.702 m)   Wt 158 lb 6.4 oz (71.8 kg)   SpO2 97% Comment: RA  BMI 24.81 kg/m  General appearance: alert, cooperative and no distress Head: Normocephalic, without obvious abnormality, atraumatic Neck: no adenopathy, no carotid bruit, no JVD, supple, symmetrical, trachea midline and thyroid not enlarged, symmetric, no tenderness/mass/nodules Lymph nodes: Cervical, supraclavicular, and axillary nodes normal. Resp: clear to auscultation bilaterally Cardio: regular rate and rhythm, S1, S2 normal, no murmur, click, rub or gallop GI: soft, non-tender; bowel sounds normal; no masses,  no organomegaly Extremities: extremities normal, atraumatic, no cyanosis or edema and Homans sign is negative, no sign of DVT Neurologic: Grossly normal    Diagnostic Studies & Laboratory data:     Recent Radiology Findings:  CT Chest W Contrast  CT ABDOMEN W CONTRAST  Result Date: 12/05/2019 CLINICAL DATA:  Esophageal cancer, restaging. EXAM: CT CHEST AND ABDOMEN WITH CONTRAST TECHNIQUE: Multidetector CT imaging of the chest and abdomen was performed following the standard protocol during bolus administration of intravenous contrast. CONTRAST:  177mL ISOVUE-300 IOPAMIDOL (ISOVUE-300) INJECTION 61% COMPARISON:  PET-CT 09/20/2019 FINDINGS: CT CHEST FINDINGS Cardiovascular: The heart size appears normal. Aortic atherosclerosis. Left main, lad, left circumflex and RCA coronary artery calcifications. Mediastinum/Nodes: Normal appearance of the thyroid gland. The trachea appears patent and  is midline. Distal esophageal wall thickening has improved from previous exam. On today's exam the distal esophagus underlying lesion measures 2.5 x 2.3 by 4.7 cm (volume = 14 cm^3), image 101/9 and image 57/3. On the previous exam using the same measuring scheme this measured 3.2 x 3.2 by 5.6 cm (volume = 30 cm^3). No enlarged supraclavicular, axillary, mediastinal, or hilar lymph nodes. Calcified left hilar lymph nodes identified compatible with prior granulomatous disease. Lungs/Pleura: No pleural effusion identified. Paraseptal and centrilobular emphysema. No airspace consolidation, atelectasis or pneumothorax. Calcified granulomas identified within the anterolateral left lower lobe. No suspicious pulmonary nodule or mass identified. Musculoskeletal: No chest wall mass or suspicious bone lesions identified. CT ABDOMEN FINDINGS Hepatobiliary: No focal liver abnormality is seen. No gallstones, gallbladder wall thickening, or biliary dilatation. Pancreas: Unremarkable. No pancreatic ductal dilatation or surrounding inflammatory changes. Spleen: Normal in size without focal abnormality. Adrenals/Urinary Tract: Normal appearance of the adrenal glands. Low-density structure within lateral cortex of right kidney measures 5 mm and is too small to reliably characterize. No hydronephrosis identified bilaterally. Stomach/Bowel: The stomach appears nondistended. No bowel wall thickening, inflammation or distension. Left-sided colonic diverticula noted. Vascular/Lymphatic: Aortic atherosclerosis. Similar appearance of upper limits of normal right retro peritoneal lymph nodes. No adenopathy. Other: No free fluid or fluid collections. Musculoskeletal: Spondylosis identified. IMPRESSION: 1. Interval response to therapy. Approximately 50% reduction in size of distal esophageal mass 2. No new or progressive disease identified within the chest or abdomen. 3. Emphysema and aortic atherosclerosis. 4. Left main and 3 vessel coronary  artery calcifications. Aortic Atherosclerosis (ICD10-I70.0) and Emphysema (ICD10-J43.9). Electronically Signed   By: Kerby Moors M.D.   On: 12/05/2019 09:07    CLINICAL DATA:  New diagnosis of esophageal cancer.  EXAM: CT CHEST, ABDOMEN, AND PELVIS WITH CONTRAST  TECHNIQUE: Multidetector CT imaging of the chest, abdomen and pelvis was performed following the standard protocol during bolus administration of intravenous contrast.  CONTRAST:  12mL ISOVUE-300 IOPAMIDOL (ISOVUE-300) INJECTION 61%  COMPARISON:  Barium esophagram 08/22/2019.  FINDINGS: CT CHEST FINDINGS  Cardiovascular: The heart size is normal. No substantial pericardial effusion. Coronary artery calcification is evident. Atherosclerotic calcification is noted in the wall of the thoracic aorta. Ascending thoracic aorta is 4 cm diameter.  Mediastinum/Nodes: No mediastinal lymphadenopathy. 6 mm short axis lower paraesophageal node is visible on image 48/series 2. Soft tissue mass noted distal esophagus, compatible with known neoplasm. No axillary lymphadenopathy. There is no hilar lymphadenopathy.  Lungs/Pleura: Centrilobular and paraseptal emphysema evident. 6 mm perifissural nodule noted right middle lobe on image 100/series 4, likely a subpleural lymph node. Tiny calcified granuloma noted left lower lobe on 140/4. A cluster of granulomata in scarring in the lateral left lower lobe is compatible with prior granulomatous involvement.  Musculoskeletal: No worrisome lytic or sclerotic osseous abnormality.  CT ABDOMEN PELVIS FINDINGS  Hepatobiliary: The liver shows diffusely decreased attenuation suggesting fat deposition. Liver measures 18.2 cm craniocaudal length, enlarged. No suspicious focal lesion within the hepatic parenchyma.  Gallbladder is nondistended. No intrahepatic or extrahepatic biliary dilation.  Pancreas: No focal mass lesion. No dilatation of the main duct. No intraparenchymal  cyst. No peripancreatic edema.  1.9 x 1.3 cm nodular collection of soft tissues identified posterior to the descending duodenum (see image 75/series 2. This is in close proximity to the pancreatic head but a definite communication of parenchyma between these 2 structures is not discernible by CT. 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  Spleen: No splenomegaly. No focal mass lesion.  Adrenals/Urinary Tract: No adrenal nodule or mass. 7 mm hypoattenuating lesion interpolar right kidney is too small to characterize but is most likely benign. Similar 4 mm cortical hypointensity in the lower pole left kidney cannot be characterized but is likely benign. No evidence for hydroureter. The urinary bladder appears normal for the degree of distention.  Stomach/Bowel: Stomach is unremarkable. No gastric wall thickening. No evidence of outlet obstruction. Duodenum is normally positioned as is the ligament of Treitz. No small bowel wall thickening. No small bowel dilatation. The terminal ileum is normal. The appendix is normal. Diverticular changes are noted in the left colon without evidence of diverticulitis.  Vascular/Lymphatic: There is abdominal aortic atherosclerosis without aneurysm. Left-sided IVC evident, normal variant. 15 mm short axis hepatoduodenal ligament lymph node is mildly enlarged (image 70/2. No other mediastinal lymphadenopathy although upper normal right para-aortic nodes are visible on images 73 and 75 of series 2, measuring up to 12 mm short axis. No pelvic sidewall lymphadenopathy.  Reproductive: The prostate gland and seminal vesicles are unremarkable.  Other: No intraperitoneal free fluid.  Musculoskeletal: Fatty lesion in the inferior aspect of the iliopsoas complex is compatible with lipoma. No worrisome lytic or sclerotic osseous  abnormality.  IMPRESSION: 1. Soft tissue mass noted distal esophagus, compatible with known neoplasm. There is a small 6 mm short axis adjacent paraesophageal node, concerning for metastatic disease. 2. Upper normal to mildly enlarged hepatoduodenal ligament lymph node, with upper normal right para-aortic nodes. As metastatic disease cannot be excluded, PET-CT may prove helpful to further evaluate. 3. 1.9 x 1.3 cm nodular collection of soft tissue posterior to the descending duodenum. This is in close proximity to the pancreatic head but a definite communication of parenchyma between these 2 structures is not discernible by CT. 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 Attention on follow-up recommended. 4. 6 mm perifissural nodule right middle lobe, likely a subpleural lymph node. Attention on follow-up recommended. 5. Hepatomegaly with hepatic steatosis. 6. Ascending thoracic aorta measures 4 cm diameter. Recommend annual imaging followup by CTA or MRA. This recommendation follows 2010 ACCF/AHA/AATS/ACR/ASA/SCA/SCAI/SIR/STS/SVM Guidelines for the Diagnosis and Management of Patients with Thoracic Aortic Disease. Circulation. 2010; 121: H846-N629. Aortic aneurysm NOS (ICD10-I71.9) 7. Left-sided IVC, normal variant.   Electronically Signed   By: Misty Stanley M.D.   On: 09/05/2019 10:38   NM PET Image Initial (PI) Skull Base To Thigh  Result Date: 09/20/2019 CLINICAL DATA:  Initial treatment strategy for malignant neoplasm of distal third of esophagus. COVID vaccine in right arm on 08/19/2019 EXAM: NUCLEAR MEDICINE PET SKULL BASE TO THIGH TECHNIQUE: 8.8 mCi F-18 FDG was injected intravenously. Full-ring PET imaging was performed from the skull base to thigh after the radiotracer. CT data was obtained and used for attenuation correction and anatomic localization. Fasting blood glucose: 134 mg/dl COMPARISON:  Esophagram of 08/22/2019. Chest abdomen and  pelvic CTs of  09/05/2019. FINDINGS: Mediastinal blood pool activity: SUV max 2.8 Liver activity: SUV max NA NECK: No areas of abnormal hypermetabolism. Incidental CT findings: Bilateral carotid atherosclerosis. No cervical adenopathy. Mucosal thickening of both maxillary sinuses. Fluid in both mastoid air cells. CHEST: Low-level hypermetabolism within right axillary nodes, including index node of 1.2 cm and a S.U.V. max of 3.0, likely reactive and related to recent COVID-19 vaccine. The distal esophageal primary is markedly hypermetabolic. Example at a S.U.V. max of 25.8, including on 103/4. The adjacent periesophageal node described on CT is not hypermetabolic. No pulmonary parenchymal hypermetabolism. Incidental CT findings: Centrilobular and paraseptal emphysema. Aortic and coronary artery atherosclerosis. Borderline ascending aortic aneurysm, as on diagnostic CT. ABDOMEN/PELVIS: Multiple upper abdominal nodes demonstrate low-level, nonspecific hypermetabolism. An index retrocaval node measures 8 mm and a S.U.V. max of 2.7 on 125/4. A portal caval node measures 1.2 cm and a S.U.V. max of 3.3 on 128/4. Multifocal colonic hypermetabolism is primarily felt to be physiologic. There is focal anal hypermetabolism, slightly eccentric left, which measures a S.U.V. max of 11.9, including on 195/4. Incidental CT findings: Deferred to prior diagnostic CT. Hepatic steatosis and hepatomegaly. Left IVC. Aortic atherosclerosis. Extensive colonic diverticulosis. Lipoma within the right thigh musculature. SKELETON: No abnormal marrow activity. Incidental CT findings: none IMPRESSION: 1. Hypermetabolic distal esophageal primary. 2. Low-level hypermetabolism within upper abdominal nodes. These are technically nonspecific, but favored to be reactive in the setting of hepatic steatosis and hepatomegaly. 3. Otherwise, no evidence of metastatic disease. 4. Anal hypermetabolism could be physiologic. Consider correlation with physical  exam and colonoscopy, if patient is not up-to-date. 5. Incidental findings, including: Aortic atherosclerosis (ICD10-I70.0), coronary artery atherosclerosis and emphysema (ICD10-J43.9). Sinus disease and bilateral mastoid effusions. Electronically Signed   By: Abigail Miyamoto M.D.   On: 09/20/2019 09:26     I have independently reviewed the above radiology studies  and reviewed the findings with the patient.   Recent Lab Findings: Lab Results  Component Value Date   WBC 1.2 (L) 11/11/2019   HGB 8.9 (L) 11/11/2019   HCT 26.0 (L) 11/11/2019   PLT 190 11/11/2019   GLUCOSE 179 (H) 11/11/2019   CHOL 146 11/07/2018   TRIG 202 (H) 11/07/2018   HDL 35 (L) 11/07/2018   LDLCALC 71 11/07/2018   ALT 12 11/11/2019   AST 13 (L) 11/11/2019   NA 135 11/11/2019   K 4.1 11/11/2019   CL 102 11/11/2019   CREATININE 0.87 11/11/2019   BUN 11 11/11/2019   CO2 26 11/11/2019   HGBA1C 7.9 (A) 11/13/2019   ENDOSONOGRAPHIC FINDING: Findings: A hypoechoic mass was found in the lower third of the esophagus. The mass was encountered at 36 cm from the incisors and extended to 40 cm. The lesion was circumferential. The endosonographic borders were irregular. The mass measured up to 26 mm in thickness. There was sonographic evidence suggesting invasion into the adventitia (Layer 5). The large friable distal esophageal adenocarcinoma again identified. With gentle pressure and maneuvering, both the linear and radial echoendoscopes were able to pass through the area with very little resistance. The mass was circumfirential on EUS, but there was eccentric growth. The lesion extended beyond the muscularis propria and to the adventitia (images 12 and 13). At least three distinct peritumoral lymph nodes were found measuring 7-12 mm. No other supicious lesions were found in the upper GI tract. The heterotopic pancreatic tissue noted on the CT scan appeared to be continguous with the entire pancreas. There was lobularity  in this  area. - A mass was found in the lower third of the esophagus. A tissue diagnosis was obtained prior to this exam. This is of adenocarcinoma. This was staged T3 N3 Mx by endosonographic criteria. - No specimens collected.   Assessment / Plan:    #1 locally advanced stage adenocarcinoma the distal esophagus-completed   weekly chemotherapy and radiation therapy-now with CT evidence of at least 50% reduction in size of the mass, and without evidence of distant metastatic disease.  Again reviewed with the patient and his wife the pros and cons of surgical resection.  He is agreeable to proceed with esophagectomy.  While we wait for recovery from chemo and radiation will obtain a cardiology cardiac clearance-although he does not have active chest pain currently he does have significant calcification of the left main And coronary arteries there is high risk for coronary artery disease.     #2 history of long-term tobacco use-currently not smoking since October 2020 #3 oral agent controlled diabetes #4 incidental finding of mildly dilated ascending aorta 4 cm in diameter  Plan see the patient back in the first week of July and consider proceeding with esophagectomy POSS July 14  [name] MD      [address] [place] [address] Office [phone]     12/05/2019 10:40 AM

## 2019-12-10 DIAGNOSIS — Z01810 Encounter for preprocedural cardiovascular examination: Secondary | ICD-10-CM | POA: Insufficient documentation

## 2019-12-10 DIAGNOSIS — F1010 Alcohol abuse, uncomplicated: Secondary | ICD-10-CM | POA: Diagnosis not present

## 2019-12-10 DIAGNOSIS — Z7189 Other specified counseling: Secondary | ICD-10-CM | POA: Insufficient documentation

## 2019-12-10 DIAGNOSIS — R931 Abnormal findings on diagnostic imaging of heart and coronary circulation: Secondary | ICD-10-CM | POA: Insufficient documentation

## 2019-12-10 NOTE — Progress Notes (Signed)
Cardiology Office Note   Date:  12/11/2019   ID:  [name], [name] [birth year], MRN [id number]  PCP:  Denita Lung, MD  Cardiologist:   No primary care provider on file. Referring: [name], MD  No chief complaint on file.     History of Present Illness: James Dunn is a 69 y.o. male who is referred by [name], MD for evaluation of coronary calcium.     He is being seen by Dr. Servando Snare for treatment of esophageal adenocarcinoma.   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       He is being considered for surgical resection.   He has not had any prior cardiac history but he does have significant cardiovascular risk factors.  He is limited in his functional activity because of bilateral hip pain.  He tries to little golfing.  He tries to do a little yard work.  However, he fatigues.  There might be some shortness of breath.  He is not describing any chest pressure, neck or arm discomfort.  Is not having any palpitations, presyncope or syncope.  He was noted to have the esophageal cancer after he gets difficulty swallowing.   Past Medical History:  Diagnosis Date  . Allergy   . Asthma   . Controlled type 2 diabetes mellitus without complication, without long-term current use of insulin ([place]) 06/25/2012  . Diverticulosis   . ED (erectile dysfunction) 12/28/2010  . Esophageal cancer ([place]) 09/22/2019  . Former smoker 06/25/2012  . GERD (gastroesophageal reflux disease) 2002  . History of colonic polyps 05/11/2016  . Hyperlipidemia associated with type 2 diabetes mellitus ([place]) 05/11/2016  . Hypertension associated with diabetes ([place]) 08/02/2017  . Mild intermittent asthma without complication 6/80/3212  . Seasonal allergic rhinitis due to pollen 11/29/2016  . Tubular adenoma of colon 12/05/2016    Past Surgical History:  Procedure Laterality Date  . BIOPSY  08/30/2019   Procedure: BIOPSY;  Surgeon: [name], MD;  Location: [facility]  ENDOSCOPY;  Service: Endoscopy;;  . ESOPHAGOGASTRODUODENOSCOPY (EGD) WITH PROPOFOL N/A 08/30/2019   Procedure: ESOPHAGOGASTRODUODENOSCOPY (EGD) WITH PROPOFOL;  Surgeon: [name], MD;  Location: [facility] ENDOSCOPY;  Service: Endoscopy;  Laterality: N/A;  . ESOPHAGOGASTRODUODENOSCOPY (EGD) WITH PROPOFOL N/A 09/13/2019   Procedure: ESOPHAGOGASTRODUODENOSCOPY (EGD) WITH PROPOFOL;  Surgeon: [name], MD;  Location: [facility] END[facility]COPY;  Service: Endoscopy;  Laterality: N/A;  . SKIN BIOPSY N/A 03/04/2019   upper back Nerofibroma and follicular cyst   . SUBMUCOSAL INJECTION  08/30/2019   Procedure: SUBMUCOSAL INJECTION;  Surgeon: [name], MD;  Location: [facility] END[facility]COPY;  Service: Endoscopy;;  . UPPER ESOPHAGEAL ENDOSCOPIC ULTRASOUND (EUS) N/A 09/13/2019   Procedure: UPPER ESOPHAGEAL ENDOSCOPIC ULTRASOUND (EUS);  Surgeon: [name], MD;  Location: [facility];  Service: Endoscopy;  Laterality: N/A;     Current Outpatient Medications  Medication Sig Dispense Refill  . albuterol (VENTOLIN HFA) 108 (90 Base) MCG/ACT inhaler TAKE 2 PUFFS BY MOUTH EVERY 6 HOURS AS NEEDED FOR WHEEZE 18 g 0  . atorvastatin (LIPITOR) 10 MG tablet Take 10 mg by mouth daily.    Marland Kitchen esomeprazole (NEXIUM) 40 MG capsule Take 40 mg by mouth daily.    Marland Kitchen lidocaine (XYLOCAINE) 2 % solution Use as directed 10 mLs in the mouth or throat every 4 (four) hours as needed. Swallow every 4 hours as needed for esophagitis. 450 mL 1  . metFORMIN (GLUCOPHAGE) 500 MG tablet Take 1 tablet (  500 mg total) by mouth 2 (two) times daily with a meal. 180 tablet 1  . morphine (MS CONTIN) 15 MG 12 hr tablet Take 1 tablet (15 mg total) by mouth every 12 (twelve) hours. 30 tablet 0  . oxyCODONE (OXY IR/ROXICODONE) 5 MG immediate release tablet Take 1-2 tablets (5-10 mg total) by mouth every 6 (six) hours as needed for severe pain. 60 tablet 0  . pantoprazole (PROTONIX) 40 MG tablet TAKE 1 TABLET BY MOUTH EVERY DAY 30 tablet 3  . prochlorperazine (COMPAZINE)  10 MG tablet Take 1 tablet (10 mg total) by mouth every 6 (six) hours as needed (Nausea or vomiting). 30 tablet 1  . sucralfate (CARAFATE) 1 g tablet Take 1 tablet (1 g total) by mouth 4 (four) times daily. Dissolve each tablet in 15 cc water before use. 120 tablet 2   No current facility-administered medications for this visit.    Allergies:   Patient has no known allergies.    Social History:  The patient  reports that he quit smoking about 8 months ago. His smoking use included cigarettes. He has never used smokeless tobacco. He reports previous alcohol use of about 7.0 standard drinks of alcohol per week. He reports that he does not use drugs.   Family History:  The patient's family history includes AAA (abdominal aortic aneurysm) in his father; Emphysema in his mother.    ROS:  Please see the history of present illness.   Otherwise, review of systems are positive for none.   All other systems are reviewed and negative.    PHYSICAL EXAM: VS:  BP 115/60   Pulse 87   Temp 98.2 F (36.8 C)   Ht 5\' 7"  (1.702 m)   Wt 157 lb (71.2 kg)   SpO2 98%   BMI 24.59 kg/m  , BMI Body mass index is 24.59 kg/m. GENERAL:  Well appearing HEENT:  Pupils equal round and reactive, fundi not visualized, oral mucosa unremarkable NECK:  No jugular venous distention, waveform within normal limits, carotid upstroke brisk and symmetric, no bruits, no thyromegaly LYMPHATICS:  No cervical, inguinal adenopathy LUNGS:  Clear to auscultation bilaterally BACK:  No CVA tenderness CHEST:  Unremarkable HEART:  PMI not displaced or sustained,S1 and S2 within normal limits, no S3, no S4, no clicks, no rubs, no murmurs ABD:  Flat, positive bowel sounds normal in frequency in pitch, no bruits, no rebound, no guarding, no midline pulsatile mass, no hepatomegaly, no splenomegaly EXT:  2 plus pulses throughout, no edema, no cyanosis no clubbing SKIN:  No rashes no nodules NEURO:  Cranial nerves II through XII grossly  intact, motor grossly intact throughout PSYCH:  Cognitively intact, oriented to person place and time    EKG:  EKG is ordered today. The ekg ordered today demonstrates sinus rhythm, rate 87, low voltage on the limb leads, no acute ST-T wave changes.   Recent Labs: 11/11/2019: ALT 12; BUN 11; Creatinine 0.87; Hemoglobin 8.9; Platelet Count 190; Potassium 4.1; Sodium 135    Lipid Panel    Component Value Date/Time   CHOL 146 11/07/2018 0835   TRIG 202 (H) 11/07/2018 0835   HDL 35 (L) 11/07/2018 0835   CHOLHDL 4.2 11/07/2018 0835   CHOLHDL 6.2 (H) 05/11/2016 0906   VLDL 52 (H) 05/11/2016 0906   LDLCALC 71 11/07/2018 0835      Wt Readings from Last 3 Encounters:  12/11/19 157 lb (71.2 kg)  12/05/19 158 lb 6.4 oz (71.8 kg)  11/13/19 159  lb 3.2 oz (72.2 kg)      Other studies Reviewed: Additional studies/ records that were reviewed today include: CT. Review of the above records demonstrates:  Please see elsewhere in the note.     ASSESSMENT AND PLAN:  Preop:   The patient is being considered for what would be a high risk procedure.  He has a limited functional level because of hip pain.  He has significant cardiovascular risk factors.  There are some slight abnormalities on EKG.  He has fatigue which could be an anginal equivalent.  Given all of this, according to ACC/AHA guidelines, the patient needs preoperative screening.  He would not be able to walk on a treadmill.  Therefore, he will need a Lexiscan Myoview.  Elevated coronary calcium:    This will be evaluated as above.  DM: A1c was 7.9.  I will defer to Denita Lung, MD  Dyslipidemia:   LDL 71 with an HDL of 35.  We will continue the meds as listed.  HTN: Blood pressures actually been running well.  No change in therapy.    Current medicines are reviewed at length with the patient today.  The patient does not have concerns regarding medicines.  The following changes have been made:  no change  Labs/ tests  ordered today include:  Orders Placed This Encounter  Procedures  . MYOCARDIAL PERFUSION IMAGING  . EKG 12-Lead     Disposition:   FU with me as needed.      Signed, Minus Breeding, MD  12/11/2019 12:05 PM    [place]

## 2019-12-11 ENCOUNTER — Ambulatory Visit (INDEPENDENT_AMBULATORY_CARE_PROVIDER_SITE_OTHER): Payer: PPO | Admitting: Cardiology

## 2019-12-11 ENCOUNTER — Encounter: Payer: Self-pay | Admitting: Cardiology

## 2019-12-11 ENCOUNTER — Encounter: Payer: Self-pay | Admitting: *Deleted

## 2019-12-11 ENCOUNTER — Other Ambulatory Visit: Payer: Self-pay

## 2019-12-11 VITALS — BP 115/60 | HR 87 | Temp 98.2°F | Ht 67.0 in | Wt 157.0 lb

## 2019-12-11 DIAGNOSIS — R931 Abnormal findings on diagnostic imaging of heart and coronary circulation: Secondary | ICD-10-CM

## 2019-12-11 DIAGNOSIS — R9431 Abnormal electrocardiogram [ECG] [EKG]: Secondary | ICD-10-CM

## 2019-12-11 DIAGNOSIS — E118 Type 2 diabetes mellitus with unspecified complications: Secondary | ICD-10-CM

## 2019-12-11 DIAGNOSIS — Z01810 Encounter for preprocedural cardiovascular examination: Secondary | ICD-10-CM

## 2019-12-11 DIAGNOSIS — Z7189 Other specified counseling: Secondary | ICD-10-CM

## 2019-12-11 DIAGNOSIS — E785 Hyperlipidemia, unspecified: Secondary | ICD-10-CM

## 2019-12-11 DIAGNOSIS — I10 Essential (primary) hypertension: Secondary | ICD-10-CM | POA: Diagnosis not present

## 2019-12-11 NOTE — Patient Instructions (Addendum)
Medication Instructions:  Your physician recommends that you continue on your current medications as directed. Please refer to the Current Medication list given to you today.  *If you need a refill on your cardiac medications before your next appointment, please call your pharmacy*  Lab Work: NONE   Testing/Procedures: Your physician has requested that you have a lexiscan myoview. For further information please visit [email or web address]. Please follow instruction sheet, as given.  Follow-Up: As needed

## 2019-12-12 DIAGNOSIS — F1010 Alcohol abuse, uncomplicated: Secondary | ICD-10-CM | POA: Diagnosis not present

## 2019-12-16 ENCOUNTER — Other Ambulatory Visit: Payer: Self-pay | Admitting: Hematology

## 2019-12-16 ENCOUNTER — Telehealth: Payer: Self-pay | Admitting: Hematology

## 2019-12-16 ENCOUNTER — Telehealth: Payer: Self-pay

## 2019-12-16 MED ORDER — OXYCODONE HCL 5 MG PO TABS: 5.0000 mg | ORAL_TABLET | Freq: Three times a day (TID) | ORAL | 0 refills | Status: DC | PRN

## 2019-12-16 NOTE — Telephone Encounter (Signed)
Patient called today to refill his pain medication.  I called him back.  He had no-show for his appointments after he was last seen by [place] a month ago.  He is overall doing much better, dysphagia has much improved.  He is taking oxycodone 5 mg 1 to 2 tablets a day, he has stopped using MS Contin.  He is scheduled for esophagectomy on January 01, 2020 with Dr. Servando Snare.  I will refill oxycodone 5 mg total of 40 tablets, I will defer his future refill after surgery to Dr. Pia Mau.  I will schedule his follow-up with [place] 3 weeks after his surgery, to discuss adjuvant immunotherapy, which was recently approved for esophageal cancer.  I encouraged him to call us if he is not able to make his next appointment.  He voiced good understanding and agrees with the plan.  [name]  12/16/2019

## 2019-12-16 NOTE — Telephone Encounter (Signed)
James Dunn left vm requesting refill for MS contin and oxycodone.

## 2019-12-17 ENCOUNTER — Telehealth (HOSPITAL_COMMUNITY): Payer: Self-pay

## 2019-12-17 ENCOUNTER — Telehealth: Payer: Self-pay | Admitting: Hematology

## 2019-12-17 NOTE — Telephone Encounter (Signed)
Encounter complete. 

## 2019-12-17 NOTE — Telephone Encounter (Signed)
Patient returning call.

## 2019-12-17 NOTE — Telephone Encounter (Signed)
Scheduled appt per 6/ sch message - pt aware of appts added.

## 2019-12-18 ENCOUNTER — Encounter: Payer: PPO | Admitting: Cardiothoracic Surgery

## 2019-12-19 ENCOUNTER — Other Ambulatory Visit: Payer: Self-pay

## 2019-12-19 ENCOUNTER — Encounter: Payer: PPO | Admitting: Cardiothoracic Surgery

## 2019-12-19 ENCOUNTER — Ambulatory Visit (HOSPITAL_COMMUNITY)
Admission: RE | Admit: 2019-12-19 | Discharge: 2019-12-19 | Disposition: A | Discharge status: Home / Self Care | Payer: PPO | Source: Ambulatory Visit | Attending: Cardiology | Admitting: Cardiology

## 2019-12-19 ENCOUNTER — Telehealth: Payer: Self-pay | Admitting: Family Medicine

## 2019-12-19 DIAGNOSIS — E118 Type 2 diabetes mellitus with unspecified complications: Secondary | ICD-10-CM | POA: Insufficient documentation

## 2019-12-19 DIAGNOSIS — I10 Essential (primary) hypertension: Secondary | ICD-10-CM | POA: Insufficient documentation

## 2019-12-19 DIAGNOSIS — R9431 Abnormal electrocardiogram [ECG] [EKG]: Secondary | ICD-10-CM | POA: Insufficient documentation

## 2019-12-19 DIAGNOSIS — Z01810 Encounter for preprocedural cardiovascular examination: Secondary | ICD-10-CM | POA: Insufficient documentation

## 2019-12-19 DIAGNOSIS — R931 Abnormal findings on diagnostic imaging of heart and coronary circulation: Secondary | ICD-10-CM | POA: Insufficient documentation

## 2019-12-19 DIAGNOSIS — E785 Hyperlipidemia, unspecified: Secondary | ICD-10-CM | POA: Diagnosis not present

## 2019-12-19 LAB — MYOCARDIAL PERFUSION IMAGING
LV dias vol: 100 mL (ref 62–150)
LV sys vol: 44 mL
Peak HR: 96 {beats}/min
Rest HR: 69 {beats}/min
SDS: 2
SRS: 6
SSS: 8
TID: 0.99

## 2019-12-19 MED ORDER — ATORVASTATIN CALCIUM 10 MG PO TABS: 10.0000 mg | ORAL_TABLET | Freq: Every day | ORAL | 1 refills | Status: DC

## 2019-12-19 MED ORDER — TECHNETIUM TC 99M TETROFOSMIN IV KIT
10.9000 | PACK | Freq: Once | INTRAVENOUS | Status: AC | PRN
  Administered 2019-12-19: 10.9 via INTRAVENOUS
  Filled 2019-12-19: qty 11

## 2019-12-19 MED ORDER — REGADENOSON 0.4 MG/5ML IV SOLN
0.4000 mg | Freq: Once | INTRAVENOUS | Status: AC
  Administered 2019-12-19: 0.4 mg via INTRAVENOUS

## 2019-12-19 MED ORDER — TECHNETIUM TC 99M TETROFOSMIN IV KIT
31.5000 | PACK | Freq: Once | INTRAVENOUS | Status: AC | PRN
  Administered 2019-12-19: 31.5 via INTRAVENOUS
  Filled 2019-12-19: qty 32

## 2019-12-19 NOTE — Telephone Encounter (Signed)
Pt called and is requesting a refill on his Lipitor please send to the [id number] - [place], [place] - [address]

## 2019-12-19 NOTE — Telephone Encounter (Signed)
Done and pt was advised Kh 

## 2019-12-20 ENCOUNTER — Other Ambulatory Visit: Payer: Self-pay | Admitting: Family Medicine

## 2019-12-20 DIAGNOSIS — E0865 Diabetes mellitus due to underlying condition with hyperglycemia: Secondary | ICD-10-CM

## 2019-12-20 NOTE — Telephone Encounter (Signed)
[facility] per Dr. [name] Is was D/C . Please advise if he should stay off. [facility]

## 2019-12-23 NOTE — Progress Notes (Signed)
  Radiation Oncology         [phone] ________________________________  Name: James Dunn MRN: [id number]  Date: 11/06/2019  DOB: 12-May-1951  End of Treatment Note  Diagnosis:   Esophageal cancer     Indication for treatment::  curative       Radiation treatment dates:   09/30/19 - 11/06/19    Site/dose:   The patient was treated to the esophagus to a dose of 45 Gy in 25 fractions using a IMRT technique. He then received a 5.4 Gy boost, also using IMRT. The total dose was 50.4 Gy.  Narrative: The patient tolerated radiation treatment relatively well.   Esophagitis was experienced towards the end of XRT.    Plan: The patient has completed radiation treatment. The patient will return to radiation oncology clinic for routine followup in one month. I advised the patient to call or return sooner if they have any questions or concerns related to their recovery or treatment. ________________________________  Jodelle Gross, M.D., Ph.D.

## 2019-12-27 NOTE — Pre-Procedure Instructions (Signed)
[id number] [place] [place] [address] [place] [address] Phone: [phone] Fax: [phone]     Your procedure is scheduled on Wednesday, July 14 from 08:30 AM- 4:46 PM.  Report to [facility]tr[facility] "A" at 06:30 A.M., and check in at the Admitting office.  Call this number if you have problems the morning of surgery:  [phone]  Call [phone] if you have any questions prior to your surgery date Monday-Friday 8am-4pm    Remember:  Do not eat or drink after midnight the night before your surgery.    Take these medicines the morning of surgery with A SIP OF WATER: atorvastatin (LIPITOR) sucralfate (CARAFATE)  IF NEEDED: esomeprazole (NEXIUM) oxyCODONE (OXY IR/ROXICODONE) prochlorperazine (COMPAZINE)  albuterol (VENTOLIN HFA) inhaler- Please bring with you the day of surgery.   As of today, STOP taking any Aspirin (unless otherwise instructed by your surgeon) Aleve, Naproxen, Ibuprofen, Motrin, Advil, Goody's, BC's, all herbal medications, fish oil, and all vitamins.    WHAT DO I DO ABOUT MY DIABETES MEDICATION?  Marland Kitchen Do not take metFORMIN (GLUCOPHAGE) the morning of surgery.   HOW TO MANAGE YOUR DIABETES BEFORE AND AFTER SURGERY  Why is it important to control my blood sugar before and after surgery? . Improving blood sugar levels before and after surgery helps healing and can limit problems. . A way of improving blood sugar control is eating a healthy diet by: o  Eating less sugar and carbohydrates o  Increasing activity/exercise o  Talking with your doctor about reaching your blood sugar goals . High blood sugars (greater than 180 mg/dL) can raise your risk of infections and slow your recovery, so you will need to focus on controlling your diabetes during the weeks before surgery. . Make sure that the doctor who takes care of your diabetes knows about your planned surgery  including the date and location.  How do I manage my blood sugar before surgery? . Check your blood sugar at least 4 times a day, starting 2 days before surgery, to make sure that the level is not too high or low. . Check your blood sugar the morning of your surgery when you wake up and every 2 hours until you get to the Short Stay unit. o If your blood sugar is less than 70 mg/dL, you will need to treat for low blood sugar: - Treat a low blood sugar (less than 70 mg/dL) with  cup of clear juice (cranberry or apple), 4 glucose tablets, OR glucose gel. - Recheck blood sugar in 15 minutes after treatment (to make sure it is greater than 70 mg/dL). If your blood sugar is not greater than 70 mg/dL on recheck, call [phone] for further instructions. . Report your blood sugar to the short stay nurse when you get to Short Stay.  . If you are admitted to the hospital after surgery: o Your blood sugar will be checked by the staff and you will probably be given insulin after surgery (instead of oral diabetes medicines) to make sure you have good blood sugar levels. o The goal for blood sugar control after surgery is 80-180 mg/dL.      The Morning of Surgery:                 Do not wear jewelry.            Do not wear lotions, powders, colognes, or deodorant.  Men may shave face and neck.            Do not bring valuables to the hospital.            [facility] is not responsible for any belongings or valuables.  Do NOT Smoke (Tobacco/Vaping) or drink Alcohol 24 hours prior to your procedure. If you use a CPAP at night, you may bring all equipment for your overnight stay.   Contacts, glasses, dentures or bridgework may not be worn into surgery.      For patients admitted to the hospital, discharge time will be determined by your treatment team.   Patients discharged the day of surgery will not be allowed to drive home, and someone needs to stay with them for 24 hours.    Special  instructions:   - Preparing For Surgery  Before surgery, you can play an important role. Because skin is not sterile, your skin needs to be as free of germs as possible. You can reduce the number of germs on your skin by washing with CHG (chlorahexidine gluconate) Soap before surgery.  CHG is an antiseptic cleaner which kills germs and bonds with the skin to continue killing germs even after washing.    Oral Hygiene is also important to reduce your risk of infection.  Remember - BRUSH YOUR TEETH THE MORNING OF SURGERY WITH YOUR REGULAR TOOTHPASTE  Please do not use if you have an allergy to CHG or antibacterial soaps. If your skin becomes reddened/irritated stop using the CHG.  Do not shave (including legs and underarms) for at least 48 hours prior to first CHG shower. It is OK to shave your face.  Please follow these instructions carefully.   1. Shower the NIGHT BEFORE SURGERY and the MORNING OF SURGERY with CHG Soap.   2. If you chose to wash your hair, wash your hair first as usual with your normal shampoo.  3. After you shampoo, rinse your hair and body thoroughly to remove the shampoo.  4. Use CHG as you would any other liquid soap. You can apply CHG directly to the skin and wash gently with a scrungie or a clean washcloth.   5. Apply the CHG Soap to your body ONLY FROM THE NECK DOWN.  Do not use on open wounds or open sores. Avoid contact with your eyes, ears, mouth and genitals (private parts). Wash Face and genitals (private parts)  with your normal soap.   6. Wash thoroughly, paying special attention to the area where your surgery will be performed.  7. Thoroughly rinse your body with warm water from the neck down.  8. DO NOT shower/wash with your normal soap after using and rinsing off the CHG Soap.  9. Pat yourself dry with a CLEAN TOWEL.  10. Wear CLEAN PAJAMAS to bed the night before surgery  11. Place CLEAN SHEETS on your bed the night of your first shower and  DO NOT SLEEP WITH PETS.   Day of Surgery: Wear Clean/Comfortable clothing the morning of surgery Do not apply any deodorants/lotions.   Remember to brush your teeth WITH YOUR REGULAR TOOTHPASTE.   Please read over the following fact sheets that you were given.

## 2019-12-30 ENCOUNTER — Ambulatory Visit (INDEPENDENT_AMBULATORY_CARE_PROVIDER_SITE_OTHER): Payer: PPO | Admitting: Cardiothoracic Surgery

## 2019-12-30 ENCOUNTER — Ambulatory Visit (HOSPITAL_COMMUNITY)
Admission: RE | Admit: 2019-12-30 | Discharge: 2019-12-30 | Disposition: A | Discharge status: Home / Self Care | Payer: PPO | Source: Ambulatory Visit | Attending: Cardiothoracic Surgery | Admitting: Cardiothoracic Surgery

## 2019-12-30 ENCOUNTER — Encounter (HOSPITAL_COMMUNITY)
Admission: RE | Admit: 2019-12-30 | Discharge: 2019-12-30 | Disposition: A | Discharge status: Home / Self Care | Payer: PPO | Source: Ambulatory Visit | Attending: Cardiothoracic Surgery | Admitting: Cardiothoracic Surgery

## 2019-12-30 ENCOUNTER — Encounter (HOSPITAL_COMMUNITY): Payer: Self-pay

## 2019-12-30 ENCOUNTER — Other Ambulatory Visit (HOSPITAL_COMMUNITY)
Admission: RE | Admit: 2019-12-30 | Discharge: 2019-12-30 | Disposition: A | Discharge status: Home / Self Care | Payer: PPO | Source: Ambulatory Visit | Attending: Cardiothoracic Surgery | Admitting: Cardiothoracic Surgery

## 2019-12-30 ENCOUNTER — Other Ambulatory Visit: Payer: Self-pay

## 2019-12-30 ENCOUNTER — Other Ambulatory Visit (HOSPITAL_COMMUNITY): Payer: PPO

## 2019-12-30 VITALS — BP 115/68 | HR 76 | Temp 97.6°F | Resp 20 | Ht 67.0 in | Wt 159.8 lb

## 2019-12-30 DIAGNOSIS — C159 Malignant neoplasm of esophagus, unspecified: Secondary | ICD-10-CM

## 2019-12-30 DIAGNOSIS — Z6825 Body mass index (BMI) 25.0-25.9, adult: Secondary | ICD-10-CM | POA: Diagnosis not present

## 2019-12-30 DIAGNOSIS — I2510 Atherosclerotic heart disease of native coronary artery without angina pectoris: Secondary | ICD-10-CM | POA: Diagnosis not present

## 2019-12-30 DIAGNOSIS — T17900A Unspecified foreign body in respiratory tract, part unspecified causing asphyxiation, initial encounter: Secondary | ICD-10-CM | POA: Diagnosis not present

## 2019-12-30 DIAGNOSIS — E1169 Type 2 diabetes mellitus with other specified complication: Secondary | ICD-10-CM | POA: Diagnosis present

## 2019-12-30 DIAGNOSIS — E440 Moderate protein-calorie malnutrition: Secondary | ICD-10-CM | POA: Diagnosis present

## 2019-12-30 DIAGNOSIS — K219 Gastro-esophageal reflux disease without esophagitis: Secondary | ICD-10-CM | POA: Diagnosis present

## 2019-12-30 DIAGNOSIS — I454 Nonspecific intraventricular block: Secondary | ICD-10-CM | POA: Diagnosis not present

## 2019-12-30 DIAGNOSIS — Z9181 History of falling: Secondary | ICD-10-CM | POA: Diagnosis not present

## 2019-12-30 DIAGNOSIS — K5790 Diverticulosis of intestine, part unspecified, without perforation or abscess without bleeding: Secondary | ICD-10-CM | POA: Diagnosis not present

## 2019-12-30 DIAGNOSIS — Z86010 Personal history of colonic polyps: Secondary | ICD-10-CM | POA: Diagnosis not present

## 2019-12-30 DIAGNOSIS — Z4682 Encounter for fitting and adjustment of non-vascular catheter: Secondary | ICD-10-CM | POA: Diagnosis not present

## 2019-12-30 DIAGNOSIS — J4520 Mild intermittent asthma, uncomplicated: Secondary | ICD-10-CM | POA: Diagnosis present

## 2019-12-30 DIAGNOSIS — I10 Essential (primary) hypertension: Secondary | ICD-10-CM | POA: Diagnosis present

## 2019-12-30 DIAGNOSIS — J9811 Atelectasis: Secondary | ICD-10-CM | POA: Diagnosis not present

## 2019-12-30 DIAGNOSIS — N529 Male erectile dysfunction, unspecified: Secondary | ICD-10-CM | POA: Diagnosis not present

## 2019-12-30 DIAGNOSIS — Z87891 Personal history of nicotine dependence: Secondary | ICD-10-CM | POA: Diagnosis not present

## 2019-12-30 DIAGNOSIS — I700 Atherosclerosis of aorta: Secondary | ICD-10-CM | POA: Diagnosis not present

## 2019-12-30 DIAGNOSIS — C155 Malignant neoplasm of lower third of esophagus: Secondary | ICD-10-CM | POA: Diagnosis present

## 2019-12-30 DIAGNOSIS — Z01812 Encounter for preprocedural laboratory examination: Secondary | ICD-10-CM | POA: Insufficient documentation

## 2019-12-30 DIAGNOSIS — E785 Hyperlipidemia, unspecified: Secondary | ICD-10-CM | POA: Diagnosis present

## 2019-12-30 DIAGNOSIS — J439 Emphysema, unspecified: Secondary | ICD-10-CM | POA: Diagnosis not present

## 2019-12-30 DIAGNOSIS — Z85038 Personal history of other malignant neoplasm of large intestine: Secondary | ICD-10-CM | POA: Diagnosis not present

## 2019-12-30 DIAGNOSIS — D62 Acute posthemorrhagic anemia: Secondary | ICD-10-CM | POA: Diagnosis not present

## 2019-12-30 DIAGNOSIS — K2210 Ulcer of esophagus without bleeding: Secondary | ICD-10-CM | POA: Diagnosis not present

## 2019-12-30 DIAGNOSIS — Z825 Family history of asthma and other chronic lower respiratory diseases: Secondary | ICD-10-CM | POA: Diagnosis not present

## 2019-12-30 DIAGNOSIS — R1310 Dysphagia, unspecified: Secondary | ICD-10-CM | POA: Diagnosis present

## 2019-12-30 DIAGNOSIS — Z9049 Acquired absence of other specified parts of digestive tract: Secondary | ICD-10-CM | POA: Diagnosis not present

## 2019-12-30 DIAGNOSIS — Z20822 Contact with and (suspected) exposure to covid-19: Secondary | ICD-10-CM | POA: Diagnosis present

## 2019-12-30 DIAGNOSIS — Z483 Aftercare following surgery for neoplasm: Secondary | ICD-10-CM | POA: Diagnosis not present

## 2019-12-30 DIAGNOSIS — Z7984 Long term (current) use of oral hypoglycemic drugs: Secondary | ICD-10-CM | POA: Diagnosis not present

## 2019-12-30 DIAGNOSIS — J90 Pleural effusion, not elsewhere classified: Secondary | ICD-10-CM | POA: Diagnosis not present

## 2019-12-30 LAB — COMPREHENSIVE METABOLIC PANEL
ALT: 17 U/L (ref 0–44)
AST: 18 U/L (ref 15–41)
Albumin: 3.8 g/dL (ref 3.5–5.0)
Alkaline Phosphatase: 69 U/L (ref 38–126)
Anion gap: 9 (ref 5–15)
BUN: 11 mg/dL (ref 8–23)
CO2: 23 mmol/L (ref 22–32)
Calcium: 9.2 mg/dL (ref 8.9–10.3)
Chloride: 103 mmol/L (ref 98–111)
Creatinine, Ser: 0.8 mg/dL (ref 0.61–1.24)
GFR calc Af Amer: >60 mL/min (ref >60)
GFR calc non Af Amer: >60 mL/min (ref >60)
Glucose, Bld: 161 mg/dL — ABNORMAL HIGH (ref 70–99)
Potassium: 4.2 mmol/L (ref 3.5–5.1)
Sodium: 135 mmol/L (ref 135–145)
Total Bilirubin: 0.4 mg/dL (ref 0.3–1.2)
Total Protein: 6.7 g/dL (ref 6.5–8.1)

## 2019-12-30 LAB — URINALYSIS, ROUTINE W REFLEX MICROSCOPIC
Bilirubin Urine: NEGATIVE
Glucose, UA: NEGATIVE mg/dL
Hgb urine dipstick: NEGATIVE
Ketones, ur: NEGATIVE mg/dL
Leukocytes,Ua: NEGATIVE
Nitrite: NEGATIVE
Protein, ur: NEGATIVE mg/dL
Specific Gravity, Urine: 1.027 (ref 1.005–1.030)
pH: 5 (ref 5.0–8.0)

## 2019-12-30 LAB — BLOOD GAS, ARTERIAL
Acid-Base Excess: 0.3 mmol/L (ref 0.0–2.0)
Bicarbonate: 24.1 mmol/L (ref 20.0–28.0)
Drawn by: 58793
FIO2: 21
O2 Saturation: 97 %
Patient temperature: 37
pCO2 arterial: 36.6 mmHg (ref 32.0–48.0)
pH, Arterial: 7.434 (ref 7.350–7.450)
pO2, Arterial: 87 mmHg (ref 83.0–108.0)

## 2019-12-30 LAB — SURGICAL PCR SCREEN
MRSA, PCR: NEGATIVE
Staphylococcus aureus: NEGATIVE

## 2019-12-30 LAB — CBC
HCT: 33.4 % — ABNORMAL LOW (ref 39.0–52.0)
Hemoglobin: 11.2 g/dL — ABNORMAL LOW (ref 13.0–17.0)
MCH: 30.8 pg (ref 26.0–34.0)
MCHC: 33.5 g/dL (ref 30.0–36.0)
MCV: 91.8 fL (ref 80.0–100.0)
Platelets: 176 10*3/uL (ref 150–400)
RBC: 3.64 MIL/uL — ABNORMAL LOW (ref 4.22–5.81)
RDW: 14.5 % (ref 11.5–15.5)
WBC: 6.2 10*3/uL (ref 4.0–10.5)
nRBC: 0 % (ref 0.0–0.2)

## 2019-12-30 LAB — PROTIME-INR
INR: 0.9 (ref 0.8–1.2)
Prothrombin Time: 11.5 seconds (ref 11.4–15.2)

## 2019-12-30 LAB — TYPE AND SCREEN
ABO/RH(D): O NEG
Antibody Screen: NEGATIVE

## 2019-12-30 LAB — APTT: aPTT: 28 seconds (ref 24–36)

## 2019-12-30 LAB — GLUCOSE, CAPILLARY: Glucose-Capillary: 160 mg/dL — ABNORMAL HIGH (ref 70–99)

## 2019-12-30 LAB — SARS CORONAVIRUS 2 (TAT 6-24 HRS): SARS Coronavirus 2: NEGATIVE

## 2019-12-30 NOTE — Progress Notes (Signed)
PCP Redmond School, MD Cardiologist - Percival Spanish, MD   Chest x-ray - 12/30/19 EKG - 12/11/19 Stress Test - 12/19/19 ECHO - pt denies Cardiac Cath - pt denies   Fasting Blood Sugar - pt does not know Checks Blood Sugar 1x/week   ERAS Protcol - n/a PRE-SURGERY Ensure or G2- n/a  COVID TEST- 12/30/19  Coronavirus Screening  Have you experienced the following symptoms:  Cough yes/no: No Fever (>100.1F)  yes/no: No Runny nose yes/no: No Sore throat yes/no: No Difficulty breathing/shortness of breath  yes/no: No  Have you or a family member traveled in the last 14 days and where? yes/no: No   If the patient indicates "YES" to the above questions, their PAT will be rescheduled to limit the exposure to others and, the surgeon will be notified. THE PATIENT WILL NEED TO BE ASYMPTOMATIC FOR 14 DAYS.   If the patient is not experiencing any of these symptoms, the PAT nurse will instruct them to NOT bring anyone with them to their appointment since they may have these symptoms or traveled as well.   Please remind your patients and families that hospital visitation restrictions are in effect and the importance of the restrictions.     Anesthesia review: n/a  Patient denies shortness of breath, fever, cough and chest pain at PAT appointment   All instructions explained to the patient, with a verbal understanding of the material. Patient agrees to go over the instructions while at home for a better understanding. Patient also instructed to self quarantine after being tested for COVID-19. The opportunity to ask questions was provided.

## 2019-12-30 NOTE — Progress Notes (Signed)
[place] [address]       James Dunn,James Dunn [id number]             [id number]                    [facility] [place] Medical Record #[id number] Date of Birth: 1951-06-15  Referring: [name], MD Primary Care: Denita Lung, MD Primary Cardiologist:Dr Gwenlyn Found  Chief Complaint:    Chief Complaint  Patient presents with  . Esophageal Cancer        History of Present Illness:    James Dunn 69 y.o. 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  He   presented  With  food hanging up especially meat when he tried to swallow up.  He also noted hiccuping with eating.  He had approximately 10 pound weight loss over  2 months from 185-174.  With these symptoms he underwent barium swallow upper endoscopy and was noted to have a distal esophageal mass circumferential.  Biopsy confirmed adenocarcinoma.  Esophageal ultrasound was done staged T3N3 .    The patient  started on weekly chemotherapy and daily radiation therapy starting April 14.  His last day of weekly chemo was 5/17 last radiation therapy was on 5/19  The patient is taking p.o. diet relatively well, pain with swallowing has markedly improved.  He notes he was able to eat steak without feeling like food sticking.  The patient is a previous heavy smoker for more than 40 years, but notes that he quit in October 2020.  He has no prior history of hypertension myocardial infarction angina denies hyperlipidemia previous stroke peripheral vascular disease or renal vascular disease, he does note oral agent controlled diabetes.  CT scans noted significant left main calcification coronary artery calcifications.-He was seen by cardiology and stress test was performed  "Negative perfusion study. No further work up. The patient is not going for a high risk surgery.  Therefore, based on ACC/AHA guidelines, the patient would be at acceptable risk for the planned procedure without  further cardiovascular testing. Call [name] with the results and send results to Denita Lung, MD"   Cancer Staging Esophageal cancer [facility]) Staging form: Esophagus - Adenocarcinoma, AJCC 8th Edition - Clinical stage from 09/13/2019: Stage IVA (cT3, cN2, cM0) - Signed by [name], MD on 09/22/2019  SURGICAL PATHOLOGY  CASE: [id number]  PATIENT: James Dunn  Surgical Pathology Report  Clinical History: Dysphagia / abnormal barium swallow-evaluate for  malignancy (cm)  FINAL MICROSCOPIC DIAGNOSIS:  A. ESOPHAGUS, BIOPSY:  - At least intramucosal adenocarcinoma.  - See comment.  COMMENT:  - The depth of invasion can not be determined due to the superficial  nature of the biopsy. Dr. Vic Ripper has reviewed the case and concurs  with this interpretation. Additional studies can be performed upon  clinician request. Dr. [name] was paged on 09/03/2019.   Final Diagnosis performed by Enid Cutter, MD.  Electronically signed  09/03/2019  Current Activity/ Functional Status:  Patient is independent with mobility/ambulation, transfers, ADL's, IADL's.   Zubrod Score: At the time of surgery this patient's most appropriate activity status/level should be described as: []     0    Normal activity, no symptoms [x]     1    Restricted in physical strenuous activity but ambulatory, able to do out light work []     2    Ambulatory and capable  of self care, unable to do work activities, up and about               >50 % of waking hours                              []     3    Only limited self care, in bed greater than 50% of waking hours []     4    Completely disabled, no self care, confined to bed or chair []     5    Moribund   Past Medical History:  Diagnosis Date  . Allergy   . Asthma   . Controlled type 2 diabetes mellitus without complication, without long-term current use of insulin ([place]) 06/25/2012  . Diverticulosis   . ED (erectile dysfunction) 12/28/2010  . Esophageal cancer ([place])  09/22/2019  . Former smoker 06/25/2012  . GERD (gastroesophageal reflux disease) 2002  . History of colonic polyps 05/11/2016  . Hyperlipidemia associated with type 2 diabetes mellitus ([place]) 05/11/2016  . Hypertension associated with diabetes ([place]) 08/02/2017  . Mild intermittent asthma without complication 0/63/0160  . Seasonal allergic rhinitis due to pollen 11/29/2016  . Tubular adenoma of colon 12/05/2016    Past Surgical History:  Procedure Laterality Date  . BIOPSY  08/30/2019   Procedure: BIOPSY;  Surgeon: [name], MD;  Location: [facility];  Service: Endoscopy;;  . ESOPHAGOGASTRODUODENOSCOPY (EGD) WITH PROPOFOL N/A 08/30/2019   Procedure: ESOPHAGOGASTRODUODENOSCOPY (EGD) WITH PROPOFOL;  Surgeon: [name], MD;  Location: [facility];  Service: Endoscopy;  Laterality: N/A;  . ESOPHAGOGASTRODUODENOSCOPY (EGD) WITH PROPOFOL N/A 09/13/2019   Procedure: ESOPHAGOGASTRODUODENOSCOPY (EGD) WITH PROPOFOL;  Surgeon: [name], MD;  Location: [facility];  Service: Endoscopy;  Laterality: N/A;  . SKIN BIOPSY N/A 03/04/2019   upper back Nerofibroma and follicular cyst   . SUBMUCOSAL INJECTION  08/30/2019   Procedure: SUBMUCOSAL INJECTION;  Surgeon: [name], MD;  Location: [facility];  Service: Endoscopy;;  . UPPER ESOPHAGEAL ENDOSCOPIC ULTRASOUND (EUS) N/A 09/13/2019   Procedure: UPPER ESOPHAGEAL ENDOSCOPIC ULTRASOUND (EUS);  Surgeon: [name], MD;  Location: [facility];  Service: Endoscopy;  Laterality: N/A;    Family History  Problem Relation Age of Onset  . Emphysema Mother   . AAA (abdominal aortic aneurysm) Father      Social History   Tobacco Use  Smoking Status Former Smoker  . Types: Cigarettes  . Quit date: 03/21/2019  . Years since quitting: 0.7  Smokeless Tobacco Never Used    Social History   Substance and Sexual Activity  Alcohol Use Not Currently  . Alcohol/week: 7.0 standard drinks  . Types: 7 Cans of beer per week     No Known  Allergies  Current Outpatient Medications  Medication Sig Dispense Refill  . albuterol (VENTOLIN HFA) 108 (90 Base) MCG/ACT inhaler TAKE 2 PUFFS BY MOUTH EVERY 6 HOURS AS NEEDED FOR WHEEZE (Patient taking differently: Inhale 2 puffs into the lungs every 6 (six) hours as needed for wheezing. ) 18 g 0  . atorvastatin (LIPITOR) 10 MG tablet Take 1 tablet (10 mg total) by mouth daily. 90 tablet 1  . esomeprazole (NEXIUM) 40 MG capsule Take 40 mg by mouth daily as needed (acid reflux).     Marland Kitchen lidocaine (XYLOCAINE) 2 % solution Use as directed 10 mLs in the mouth or throat every 4 (four) hours as needed. Swallow every 4 hours as needed for esophagitis. [place]  mL 1  . metFORMIN (GLUCOPHAGE) 500 MG tablet Take 1 tablet (500 mg total) by mouth 2 (two) times daily with a meal. (Patient taking differently: Take 1,000 mg by mouth daily with breakfast. ) 180 tablet 1  . oxyCODONE (OXY IR/ROXICODONE) 5 MG immediate release tablet Take 1 tablet (5 mg total) by mouth every 8 (eight) hours as needed for severe pain. 40 tablet 0  . pantoprazole (PROTONIX) 40 MG tablet TAKE 1 TABLET BY MOUTH EVERY DAY 30 tablet 3  . prochlorperazine (COMPAZINE) 10 MG tablet Take 1 tablet (10 mg total) by mouth every 6 (six) hours as needed (Nausea or vomiting). 30 tablet 1  . sucralfate (CARAFATE) 1 g tablet Take 1 tablet (1 g total) by mouth 4 (four) times daily. Dissolve each tablet in 15 cc water before use. 120 tablet 2   No current facility-administered medications for this visit.    Pertinent items are noted in HPI.   Review of Systems:     Cardiac Review of Systems: [Y] = yes  or   [ N ] = no   Chest Pain [n ]  Resting SOB [   n] Exertional SOB  [n  ]  Orthopnea Florencio.Farrier ]   Pedal Edema [  n]    Palpitations [ n] Syncope  [ n   Presyncope [n ]   General Review of Systems: [Y] = yes [  ]=no Constitional: recent weight change [  ];  Wt loss over the last 3 months [   ] anorexia [  ]; fatigue [  ]; nausea [  ]; night sweats [  ];  fever [  ]; or chills [  ];           Eye : blurred vision [  ]; diplopia [   ]; vision changes [  ];  Amaurosis fugax[  ]; Resp: cough [  ];  wheezing[  ];  hemoptysis[  ]; shortness of breath[  ]; paroxysmal nocturnal dyspnea[  ]; dyspnea on exertion[  ]; or orthopnea[  ];  GI:  gallstones[  ], vomiting[Y  ];  dysphagia[ Y ]; melena[  ];  hematochezia [  ]; heartburn[ Y ];   Hx of  Colonoscopy[  ]; GU: kidney stones [  ]; hematuria[  ];   dysuria [  ];  nocturia[  ];  history of     obstruction [  ]; urinary frequency [  ]             Skin: rash, swelling[  ];, hair loss[  ];  peripheral edema[  ];  or itching[  ]; Musculosketetal: myalgias[  ];  joint swelling[  ];  joint erythema[  ];  joint pain[  ];  back pain[  ];  Heme/Lymph: bruising[  ];  bleeding[  ];  anemia[  ];  Neuro: TIA[  ];  headaches[  ];  stroke[  ];  vertigo[  ];  seizures[  ];   paresthesias[  ];  difficulty walking[  ];  Psych:depression[  ]; anxiety[  ];  Endocrine: diabetes[  ];  thyroid dysfunction[  ];  Immunizations: Flu up to date Blue.Reese  ]; Pneumococcal up to date [ n ];  Other:  Wt Readings from Last 3 Encounters:  12/30/19 159 lb 6.4 oz (72.3 kg)  12/30/19 159 lb 12.8 oz (72.5 kg)  12/19/19 157 lb (71.2 kg)    Pre treatment 185  PHYSICAL EXAMINATION: BP 115/68   Pulse 76  Temp 97.6 F (36.4 C) (Skin)   Resp 20   Ht 5\' 7"  (1.702 m)   Wt 159 lb 12.8 oz (72.5 kg)   SpO2 97%   BMI 25.03 kg/m  General appearance: alert, cooperative, appears stated age and no distress Neck: no adenopathy, no carotid bruit, no JVD, supple, symmetrical, trachea midline and thyroid not enlarged, symmetric, no tenderness/mass/nodules Lymph nodes: Cervical, supraclavicular, and axillary nodes normal. Resp: clear to auscultation bilaterally Cardio: regular rate and rhythm, S1, S2 normal, no murmur, click, rub or gallop GI: soft, non-tender; bowel sounds normal; no masses,  no organomegaly Extremities: extremities normal,  atraumatic, no cyanosis or edema Neurologic: Grossly normal   Diagnostic Studies & Laboratory data:     Recent Radiology Findings:  No results found. CT Chest W Contrast  CT ABDOMEN W CONTRAST  Result Date: 12/05/2019 CLINICAL DATA:  Esophageal cancer, restaging. EXAM: CT CHEST AND ABDOMEN WITH CONTRAST TECHNIQUE: Multidetector CT imaging of the chest and abdomen was performed following the standard protocol during bolus administration of intravenous contrast. CONTRAST:  176mL ISOVUE-300 IOPAMIDOL (ISOVUE-300) INJECTION 61% COMPARISON:  PET-CT 09/20/2019 FINDINGS: CT CHEST FINDINGS Cardiovascular: The heart size appears normal. Aortic atherosclerosis. Left main, lad, left circumflex and RCA coronary artery calcifications. Mediastinum/Nodes: Normal appearance of the thyroid gland. The trachea appears patent and is midline. Distal esophageal wall thickening has improved from previous exam. On today's exam the distal esophagus underlying lesion measures 2.5 x 2.3 by 4.7 cm (volume = 14 cm^3), image 101/9 and image 57/3. On the previous exam using the same measuring scheme this measured 3.2 x 3.2 by 5.6 cm (volume = 30 cm^3). No enlarged supraclavicular, axillary, mediastinal, or hilar lymph nodes. Calcified left hilar lymph nodes identified compatible with prior granulomatous disease. Lungs/Pleura: No pleural effusion identified. Paraseptal and centrilobular emphysema. No airspace consolidation, atelectasis or pneumothorax. Calcified granulomas identified within the anterolateral left lower lobe. No suspicious pulmonary nodule or mass identified. Musculoskeletal: No chest wall mass or suspicious bone lesions identified. CT ABDOMEN FINDINGS Hepatobiliary: No focal liver abnormality is seen. No gallstones, gallbladder wall thickening, or biliary dilatation. Pancreas: Unremarkable. No pancreatic ductal dilatation or surrounding inflammatory changes. Spleen: Normal in size without focal abnormality.  Adrenals/Urinary Tract: Normal appearance of the adrenal glands. Low-density structure within lateral cortex of right kidney measures 5 mm and is too small to reliably characterize. No hydronephrosis identified bilaterally. Stomach/Bowel: The stomach appears nondistended. No bowel wall thickening, inflammation or distension. Left-sided colonic diverticula noted. Vascular/Lymphatic: Aortic atherosclerosis. Similar appearance of upper limits of normal right retro peritoneal lymph nodes. No adenopathy. Other: No free fluid or fluid collections. Musculoskeletal: Spondylosis identified. IMPRESSION: 1. Interval response to therapy. Approximately 50% reduction in size of distal esophageal mass 2. No new or progressive disease identified within the chest or abdomen. 3. Emphysema and aortic atherosclerosis. 4. Left main and 3 vessel coronary artery calcifications. Aortic Atherosclerosis (ICD10-I70.0) and Emphysema (ICD10-J43.9). Electronically Signed   By: Kerby Moors M.D.   On: 12/05/2019 09:07    CLINICAL DATA:  New diagnosis of esophageal cancer.  EXAM: CT CHEST, ABDOMEN, AND PELVIS WITH CONTRAST  TECHNIQUE: Multidetector CT imaging of the chest, abdomen and pelvis was performed following the standard protocol during bolus administration of intravenous contrast.  CONTRAST:  174mL ISOVUE-300 IOPAMIDOL (ISOVUE-300) INJECTION 61%  COMPARISON:  Barium esophagram 08/22/2019.  FINDINGS: CT CHEST FINDINGS  Cardiovascular: The heart size is normal. No substantial pericardial effusion. Coronary artery calcification is evident. Atherosclerotic calcification is noted in the wall of  the thoracic aorta. Ascending thoracic aorta is 4 cm diameter.  Mediastinum/Nodes: No mediastinal lymphadenopathy. 6 mm short axis lower paraesophageal node is visible on image 48/series 2. Soft tissue mass noted distal esophagus, compatible with known neoplasm. No axillary lymphadenopathy. There is no hilar  lymphadenopathy.  Lungs/Pleura: Centrilobular and paraseptal emphysema evident. 6 mm perifissural nodule noted right middle lobe on image 100/series 4, likely a subpleural lymph node. Tiny calcified granuloma noted left lower lobe on 140/4. A cluster of granulomata in scarring in the lateral left lower lobe is compatible with prior granulomatous involvement.  Musculoskeletal: No worrisome lytic or sclerotic osseous abnormality.  CT ABDOMEN PELVIS FINDINGS  Hepatobiliary: The liver shows diffusely decreased attenuation suggesting fat deposition. Liver measures 18.2 cm craniocaudal length, enlarged. No suspicious focal lesion within the hepatic parenchyma. Gallbladder is nondistended. No intrahepatic or extrahepatic biliary dilation.  Pancreas: No focal mass lesion. No dilatation of the main duct. No intraparenchymal cyst. No peripancreatic edema.  1.9 x 1.3 cm nodular collection of soft tissues identified posterior to the descending duodenum (see image 75/series 2. This is in close proximity to the pancreatic head but a definite communication of parenchyma between these 2 structures is not discernible by CT. 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  Spleen: No splenomegaly. No focal mass lesion.  Adrenals/Urinary Tract: No adrenal nodule or mass. 7 mm hypoattenuating lesion interpolar right kidney is too small to characterize but is most likely benign. Similar 4 mm cortical hypointensity in the lower pole left kidney cannot be characterized but is likely benign. No evidence for hydroureter. The urinary bladder appears normal for the degree of distention.  Stomach/Bowel: Stomach is unremarkable. No gastric wall thickening. No evidence of outlet obstruction. Duodenum is normally positioned as is the ligament of Treitz. No small bowel wall thickening. No small  bowel dilatation. The terminal ileum is normal. The appendix is normal. Diverticular changes are noted in the left colon without evidence of diverticulitis.  Vascular/Lymphatic: There is abdominal aortic atherosclerosis without aneurysm. Left-sided IVC evident, normal variant. 15 mm short axis hepatoduodenal ligament lymph node is mildly enlarged (image 70/2. No other mediastinal lymphadenopathy although upper normal right para-aortic nodes are visible on images 73 and 75 of series 2, measuring up to 12 mm short axis. No pelvic sidewall lymphadenopathy.  Reproductive: The prostate gland and seminal vesicles are unremarkable.  Other: No intraperitoneal free fluid.  Musculoskeletal: Fatty lesion in the inferior aspect of the iliopsoas complex is compatible with lipoma. No worrisome lytic or sclerotic osseous abnormality.  IMPRESSION: 1. Soft tissue mass noted distal esophagus, compatible with known neoplasm. There is a small 6 mm short axis adjacent paraesophageal node, concerning for metastatic disease. 2. Upper normal to mildly enlarged hepatoduodenal ligament lymph node, with upper normal right para-aortic nodes. As metastatic disease cannot be excluded, PET-CT may prove helpful to further evaluate. 3. 1.9 x 1.3 cm nodular collection of soft tissue posterior to the descending duodenum. This is in close proximity to the pancreatic head but a definite communication of parenchyma between these 2 structures is not discernible by CT. 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 Attention on follow-up recommended. 4. 6 mm perifissural nodule right middle lobe, likely a subpleural lymph node. Attention on follow-up recommended. 5. Hepatomegaly with hepatic steatosis. 6. Ascending thoracic aorta measures 4 cm diameter. Recommend annual imaging followup by CTA or MRA. This recommendation follows 2010  ACCF/AHA/AATS/ACR/ASA/SCA/SCAI/SIR/STS/SVM Guidelines  for the Diagnosis and Management of Patients with Thoracic Aortic Disease. Circulation. 2010; 121: J497-W263. Aortic aneurysm NOS (ICD10-I71.9) 7. Left-sided IVC, normal variant.   Electronically Signed   By: Misty Stanley M.D.   On: 09/05/2019 10:38   NM PET Image Initial (PI) Skull Base To Thigh  Result Date: 09/20/2019 CLINICAL DATA:  Initial treatment strategy for malignant neoplasm of distal third of esophagus. COVID vaccine in right arm on 08/19/2019 EXAM: NUCLEAR MEDICINE PET SKULL BASE TO THIGH TECHNIQUE: 8.8 mCi F-18 FDG was injected intravenously. Full-ring PET imaging was performed from the skull base to thigh after the radiotracer. CT data was obtained and used for attenuation correction and anatomic localization. Fasting blood glucose: 134 mg/dl COMPARISON:  Esophagram of 08/22/2019. Chest abdomen and pelvic CTs of 09/05/2019. FINDINGS: Mediastinal blood pool activity: SUV max 2.8 Liver activity: SUV max NA NECK: No areas of abnormal hypermetabolism. Incidental CT findings: Bilateral carotid atherosclerosis. No cervical adenopathy. Mucosal thickening of both maxillary sinuses. Fluid in both mastoid air cells. CHEST: Low-level hypermetabolism within right axillary nodes, including index node of 1.2 cm and a S.U.V. max of 3.0, likely reactive and related to recent COVID-19 vaccine. The distal esophageal primary is markedly hypermetabolic. Example at a S.U.V. max of 25.8, including on 103/4. The adjacent periesophageal node described on CT is not hypermetabolic. No pulmonary parenchymal hypermetabolism. Incidental CT findings: Centrilobular and paraseptal emphysema. Aortic and coronary artery atherosclerosis. Borderline ascending aortic aneurysm, as on diagnostic CT. ABDOMEN/PELVIS: Multiple upper abdominal nodes demonstrate low-level, nonspecific hypermetabolism. An index retrocaval node measures 8 mm and a S.U.V. max of 2.7 on 125/4. A portal caval node measures 1.2 cm and a S.U.V. max of  3.3 on 128/4. Multifocal colonic hypermetabolism is primarily felt to be physiologic. There is focal anal hypermetabolism, slightly eccentric left, which measures a S.U.V. max of 11.9, including on 195/4. Incidental CT findings: Deferred to prior diagnostic CT. Hepatic steatosis and hepatomegaly. Left IVC. Aortic atherosclerosis. Extensive colonic diverticulosis. Lipoma within the right thigh musculature. SKELETON: No abnormal marrow activity. Incidental CT findings: none IMPRESSION: 1. Hypermetabolic distal esophageal primary. 2. Low-level hypermetabolism within upper abdominal nodes. These are technically nonspecific, but favored to be reactive in the setting of hepatic steatosis and hepatomegaly. 3. Otherwise, no evidence of metastatic disease. 4. Anal hypermetabolism could be physiologic. Consider correlation with physical exam and colonoscopy, if patient is not up-to-date. 5. Incidental findings, including: Aortic atherosclerosis (ICD10-I70.0), coronary artery atherosclerosis and emphysema (ICD10-J43.9). Sinus disease and bilateral mastoid effusions. Electronically Signed   By: Abigail Miyamoto M.D.   On: 09/20/2019 09:26     I have independently reviewed the above radiology studies  and reviewed the findings with the patient.   Recent Lab Findings: Lab Results  Component Value Date   WBC 6.2 12/30/2019   HGB 11.2 (L) 12/30/2019   HCT 33.4 (L) 12/30/2019   PLT 176 12/30/2019   GLUCOSE 161 (H) 12/30/2019   CHOL 146 11/07/2018   TRIG 202 (H) 11/07/2018   HDL 35 (L) 11/07/2018   LDLCALC 71 11/07/2018   ALT 17 12/30/2019   AST 18 12/30/2019   NA 135 12/30/2019   K 4.2 12/30/2019   CL 103 12/30/2019   CREATININE 0.80 12/30/2019   BUN 11 12/30/2019   CO2 23 12/30/2019   INR 0.9 12/30/2019   HGBA1C 7.9 (A) 11/13/2019   ENDOSONOGRAPHIC FINDING: Findings: A hypoechoic mass was found in the lower third of the esophagus. The mass was encountered at 3  cm from the incisors and extended to 40 cm.  The lesion was circumferential. The endosonographic borders were irregular. The mass measured up to 26 mm in thickness. There was sonographic evidence suggesting invasion into the adventitia (Layer 5). The large friable distal esophageal adenocarcinoma again identified. With gentle pressure and maneuvering, both the linear and radial echoendoscopes were able to pass through the area with very little resistance. The mass was circumfirential on EUS, but there was eccentric growth. The lesion extended beyond the muscularis propria and to the adventitia (images 12 and 13). At least three distinct peritumoral lymph nodes were found measuring 7-12 mm. No other supicious lesions were found in the upper GI tract. The heterotopic pancreatic tissue noted on the CT scan appeared to be continguous with the entire pancreas. There was lobularity in this area. - A mass was found in the lower third of the esophagus. A tissue diagnosis was obtained prior to this exam. This is of adenocarcinoma. This was staged T3 N3 Mx by endosonographic criteria. - No specimens collected.   Assessment / Plan:    #1 locally advanced stage adenocarcinoma the distal esophagus-completed   weekly chemotherapy and radiation therapy-now with CT evidence of at least 50% reduction in size of the mass, and without evidence of distant metastatic disease.  Again reviewed with the patient and his wife the pros and cons of surgical resection.  He is agreeable to proceed with esophagectomy.   cardiology cardiac clearance-done  #2 history of long-term tobacco use-currently not smoking since October 2020 #3 oral agent controlled diabetes #4 incidental finding of mildly dilated ascending aorta 4 cm in diameter  I discussed with the patient and his wife in detail the pros and cons of surgical resection of this esophagus now that he has completed preoperative radiation chemotherapy.  He has tolerated this well CT scan shows evidence of  response.  I had a detailed discussion with [name] and his wife  regarding the magnitude of the surgical esophagectomy procedure as well as the risks, the expected benefits, and alternatives.  I quoted [name] 5% perioperative mortality rate and a complication rate as high as 40%.  We specifically discussed complications, which include, but were not limited to: recurrent nerve injury with possible permanent hoarseness, anastomotic leak, airway and great vessel injury, conduit ischemia, thoracic duct leak, the inability to complete the operation via a transhiatal approach requiring a right thoracotomy,  bleeding, need for blood transfusion and the potential need for ventilator support.  [name] has had questions answered is well informed and willing to proceed.  [name] MD      [place] [place] [address] Office [phone]     12/30/2019 12:15 PM

## 2020-01-01 ENCOUNTER — Encounter (HOSPITAL_COMMUNITY): Payer: Self-pay | Admitting: Cardiothoracic Surgery

## 2020-01-01 ENCOUNTER — Telehealth: Payer: Self-pay | Admitting: Radiation Oncology

## 2020-01-01 ENCOUNTER — Encounter (HOSPITAL_COMMUNITY)
Admission: RE | Disposition: A | Discharge status: Home / Self Care | Payer: Self-pay | Source: Home / Self Care | Attending: Cardiothoracic Surgery

## 2020-01-01 ENCOUNTER — Inpatient Hospital Stay (HOSPITAL_COMMUNITY): Payer: PPO | Admitting: Critical Care Medicine

## 2020-01-01 ENCOUNTER — Other Ambulatory Visit: Payer: Self-pay

## 2020-01-01 ENCOUNTER — Inpatient Hospital Stay (HOSPITAL_COMMUNITY): Payer: PPO

## 2020-01-01 ENCOUNTER — Inpatient Hospital Stay (HOSPITAL_COMMUNITY)
Admission: RE | Admit: 2020-01-01 | Discharge: 2020-01-13 | DRG: 327 | Disposition: A | Discharge status: Home Health | Payer: PPO | Attending: Cardiothoracic Surgery | Admitting: Cardiothoracic Surgery

## 2020-01-01 DIAGNOSIS — Z825 Family history of asthma and other chronic lower respiratory diseases: Secondary | ICD-10-CM | POA: Diagnosis not present

## 2020-01-01 DIAGNOSIS — Z6825 Body mass index (BMI) 25.0-25.9, adult: Secondary | ICD-10-CM

## 2020-01-01 DIAGNOSIS — J4520 Mild intermittent asthma, uncomplicated: Secondary | ICD-10-CM | POA: Diagnosis present

## 2020-01-01 DIAGNOSIS — I10 Essential (primary) hypertension: Secondary | ICD-10-CM | POA: Diagnosis present

## 2020-01-01 DIAGNOSIS — E440 Moderate protein-calorie malnutrition: Secondary | ICD-10-CM | POA: Diagnosis present

## 2020-01-01 DIAGNOSIS — Z20822 Contact with and (suspected) exposure to covid-19: Secondary | ICD-10-CM | POA: Diagnosis present

## 2020-01-01 DIAGNOSIS — E1169 Type 2 diabetes mellitus with other specified complication: Secondary | ICD-10-CM | POA: Diagnosis present

## 2020-01-01 DIAGNOSIS — Z9049 Acquired absence of other specified parts of digestive tract: Secondary | ICD-10-CM

## 2020-01-01 DIAGNOSIS — J90 Pleural effusion, not elsewhere classified: Secondary | ICD-10-CM

## 2020-01-01 DIAGNOSIS — Z9889 Other specified postprocedural states: Secondary | ICD-10-CM

## 2020-01-01 DIAGNOSIS — Z09 Encounter for follow-up examination after completed treatment for conditions other than malignant neoplasm: Secondary | ICD-10-CM

## 2020-01-01 DIAGNOSIS — E785 Hyperlipidemia, unspecified: Secondary | ICD-10-CM | POA: Diagnosis present

## 2020-01-01 DIAGNOSIS — C155 Malignant neoplasm of lower third of esophagus: Principal | ICD-10-CM | POA: Diagnosis present

## 2020-01-01 DIAGNOSIS — K219 Gastro-esophageal reflux disease without esophagitis: Secondary | ICD-10-CM | POA: Diagnosis present

## 2020-01-01 DIAGNOSIS — Z87891 Personal history of nicotine dependence: Secondary | ICD-10-CM

## 2020-01-01 DIAGNOSIS — I454 Nonspecific intraventricular block: Secondary | ICD-10-CM | POA: Diagnosis not present

## 2020-01-01 DIAGNOSIS — R1310 Dysphagia, unspecified: Secondary | ICD-10-CM | POA: Diagnosis present

## 2020-01-01 DIAGNOSIS — C159 Malignant neoplasm of esophagus, unspecified: Secondary | ICD-10-CM

## 2020-01-01 DIAGNOSIS — D62 Acute posthemorrhagic anemia: Secondary | ICD-10-CM | POA: Diagnosis not present

## 2020-01-01 DIAGNOSIS — Z9689 Presence of other specified functional implants: Secondary | ICD-10-CM

## 2020-01-01 HISTORY — PX: JEJUNOSTOMY: SHX313

## 2020-01-01 HISTORY — PX: CHEST TUBE INSERTION: SHX231

## 2020-01-01 HISTORY — PX: COMPLETE ESOPHAGECTOMY: SHX5286

## 2020-01-01 HISTORY — PX: VIDEO BRONCHOSCOPY: SHX5072

## 2020-01-01 LAB — POCT I-STAT, CHEM 8
BUN: 9 mg/dL (ref 8–23)
BUN: 10 mg/dL (ref 8–23)
BUN: 10 mg/dL (ref 8–23)
Calcium, Ion: 1.09 mmol/L — ABNORMAL LOW (ref 1.15–1.40)
Calcium, Ion: 1.17 mmol/L (ref 1.15–1.40)
Calcium, Ion: 1.14 mmol/L — ABNORMAL LOW (ref 1.15–1.40)
Chloride: 100 mmol/L (ref 98–111)
Chloride: 101 mmol/L (ref 98–111)
Chloride: 102 mmol/L (ref 98–111)
Creatinine, Ser: 0.8 mg/dL (ref 0.61–1.24)
Creatinine, Ser: 0.8 mg/dL (ref 0.61–1.24)
Creatinine, Ser: 0.8 mg/dL (ref 0.61–1.24)
Glucose, Bld: 197 mg/dL — ABNORMAL HIGH (ref 70–99)
Glucose, Bld: 144 mg/dL — ABNORMAL HIGH (ref 70–99)
Glucose, Bld: 119 mg/dL — ABNORMAL HIGH (ref 70–99)
HCT: 32 % — ABNORMAL LOW (ref 39.0–52.0)
HCT: 31 % — ABNORMAL LOW (ref 39.0–52.0)
HCT: 33 % — ABNORMAL LOW (ref 39.0–52.0)
Hemoglobin: 10.9 g/dL — ABNORMAL LOW (ref 13.0–17.0)
Hemoglobin: 10.5 g/dL — ABNORMAL LOW (ref 13.0–17.0)
Hemoglobin: 11.2 g/dL — ABNORMAL LOW (ref 13.0–17.0)
Potassium: 4.3 mmol/L (ref 3.5–5.1)
Potassium: 3.8 mmol/L (ref 3.5–5.1)
Potassium: 3.7 mmol/L (ref 3.5–5.1)
Sodium: 137 mmol/L (ref 135–145)
Sodium: 139 mmol/L (ref 135–145)
Sodium: 139 mmol/L (ref 135–145)
TCO2: 24 mmol/L (ref 22–32)
TCO2: 23 mmol/L (ref 22–32)
TCO2: 25 mmol/L (ref 22–32)

## 2020-01-01 LAB — POCT I-STAT 7, (LYTES, BLD GAS, ICA,H+H)
Acid-Base Excess: 1 mmol/L (ref 0.0–2.0)
Acid-Base Excess: 1 mmol/L (ref 0.0–2.0)
Acid-base deficit: 1 mmol/L (ref 0.0–2.0)
Acid-base deficit: 1 mmol/L (ref 0.0–2.0)
Acid-base deficit: 1 mmol/L (ref 0.0–2.0)
Bicarbonate: 27.6 mmol/L (ref 20.0–28.0)
Bicarbonate: 27 mmol/L (ref 20.0–28.0)
Bicarbonate: 24.6 mmol/L (ref 20.0–28.0)
Bicarbonate: 24.8 mmol/L (ref 20.0–28.0)
Bicarbonate: 23.1 mmol/L (ref 20.0–28.0)
Calcium, Ion: 1.23 mmol/L (ref 1.15–1.40)
Calcium, Ion: 1.14 mmol/L — ABNORMAL LOW (ref 1.15–1.40)
Calcium, Ion: 1.13 mmol/L — ABNORMAL LOW (ref 1.15–1.40)
Calcium, Ion: 1.17 mmol/L (ref 1.15–1.40)
Calcium, Ion: 1.1 mmol/L — ABNORMAL LOW (ref 1.15–1.40)
HCT: 32 % — ABNORMAL LOW (ref 39.0–52.0)
HCT: 32 % — ABNORMAL LOW (ref 39.0–52.0)
HCT: 34 % — ABNORMAL LOW (ref 39.0–52.0)
HCT: 35 % — ABNORMAL LOW (ref 39.0–52.0)
HCT: 35 % — ABNORMAL LOW (ref 39.0–52.0)
Hemoglobin: 10.9 g/dL — ABNORMAL LOW (ref 13.0–17.0)
Hemoglobin: 10.9 g/dL — ABNORMAL LOW (ref 13.0–17.0)
Hemoglobin: 11.6 g/dL — ABNORMAL LOW (ref 13.0–17.0)
Hemoglobin: 11.9 g/dL — ABNORMAL LOW (ref 13.0–17.0)
Hemoglobin: 11.9 g/dL — ABNORMAL LOW (ref 13.0–17.0)
O2 Saturation: 98 %
O2 Saturation: 100 %
O2 Saturation: 100 %
O2 Saturation: 99 %
O2 Saturation: 99 %
Patient temperature: 35
Patient temperature: 34.7
Patient temperature: 97.8
Patient temperature: 98.6
Potassium: 4.3 mmol/L (ref 3.5–5.1)
Potassium: 4.5 mmol/L (ref 3.5–5.1)
Potassium: 3.6 mmol/L (ref 3.5–5.1)
Potassium: 4 mmol/L (ref 3.5–5.1)
Potassium: 6.2 mmol/L — ABNORMAL HIGH (ref 3.5–5.1)
Sodium: 139 mmol/L (ref 135–145)
Sodium: 138 mmol/L (ref 135–145)
Sodium: 141 mmol/L (ref 135–145)
Sodium: 141 mmol/L (ref 135–145)
Sodium: 135 mmol/L (ref 135–145)
TCO2: 29 mmol/L (ref 22–32)
TCO2: 28 mmol/L (ref 22–32)
TCO2: 26 mmol/L (ref 22–32)
TCO2: 26 mmol/L (ref 22–32)
TCO2: 24 mmol/L (ref 22–32)
pCO2 arterial: 46 mmHg (ref 32.0–48.0)
pCO2 arterial: 42 mmHg (ref 32.0–48.0)
pCO2 arterial: 42.5 mmHg (ref 32.0–48.0)
pCO2 arterial: 43.2 mmHg (ref 32.0–48.0)
pCO2 arterial: 36.2 mmHg (ref 32.0–48.0)
pH, Arterial: 7.378 (ref 7.350–7.450)
pH, Arterial: 7.406 (ref 7.350–7.450)
pH, Arterial: 7.37 (ref 7.350–7.450)
pH, Arterial: 7.365 (ref 7.350–7.450)
pH, Arterial: 7.412 (ref 7.350–7.450)
pO2, Arterial: 101 mmHg (ref 83.0–108.0)
pO2, Arterial: 301 mmHg — ABNORMAL HIGH (ref 83.0–108.0)
pO2, Arterial: 203 mmHg — ABNORMAL HIGH (ref 83.0–108.0)
pO2, Arterial: 170 mmHg — ABNORMAL HIGH (ref 83.0–108.0)
pO2, Arterial: 134 mmHg — ABNORMAL HIGH (ref 83.0–108.0)

## 2020-01-01 LAB — CBC
HCT: 36.3 % — ABNORMAL LOW (ref 39.0–52.0)
Hemoglobin: 11.9 g/dL — ABNORMAL LOW (ref 13.0–17.0)
MCH: 30.1 pg (ref 26.0–34.0)
MCHC: 32.8 g/dL (ref 30.0–36.0)
MCV: 91.9 fL (ref 80.0–100.0)
Platelets: 221 10*3/uL (ref 150–400)
RBC: 3.95 MIL/uL — ABNORMAL LOW (ref 4.22–5.81)
RDW: 14.2 % (ref 11.5–15.5)
WBC: 9.9 10*3/uL (ref 4.0–10.5)
nRBC: 0 % (ref 0.0–0.2)

## 2020-01-01 LAB — GLUCOSE, CAPILLARY
Glucose-Capillary: 155 mg/dL — ABNORMAL HIGH (ref 70–99)
Glucose-Capillary: 105 mg/dL — ABNORMAL HIGH (ref 70–99)
Glucose-Capillary: 99 mg/dL (ref 70–99)
Glucose-Capillary: 139 mg/dL — ABNORMAL HIGH (ref 70–99)

## 2020-01-01 LAB — BASIC METABOLIC PANEL
Anion gap: 11 (ref 5–15)
BUN: 10 mg/dL (ref 8–23)
CO2: 22 mmol/L (ref 22–32)
Calcium: 8.5 mg/dL — ABNORMAL LOW (ref 8.9–10.3)
Chloride: 105 mmol/L (ref 98–111)
Creatinine, Ser: 1.23 mg/dL (ref 0.61–1.24)
GFR calc Af Amer: >60 mL/min (ref >60)
GFR calc non Af Amer: 60 mL/min — ABNORMAL LOW (ref >60)
Glucose, Bld: 106 mg/dL — ABNORMAL HIGH (ref 70–99)
Potassium: 4 mmol/L (ref 3.5–5.1)
Sodium: 138 mmol/L (ref 135–145)

## 2020-01-01 LAB — ABO/RH: ABO/RH(D): O NEG

## 2020-01-01 SURGERY — BRONCHOSCOPY, VIDEO-ASSISTED
Anesthesia: General | Site: Abdomen

## 2020-01-01 MED ORDER — EPINEPHRINE PF 1 MG/ML IJ SOLN
INTRAMUSCULAR | Status: AC
  Filled 2020-01-01: qty 1

## 2020-01-01 MED ORDER — SODIUM CHLORIDE 0.9% FLUSH: 9.0000 mL | INTRAVENOUS | Status: DC | PRN

## 2020-01-01 MED ORDER — PROPOFOL 10 MG/ML IV BOLUS
INTRAVENOUS | Status: DC | PRN
  Administered 2020-01-01: 120 mg via INTRAVENOUS

## 2020-01-01 MED ORDER — LIDOCAINE 2% (20 MG/ML) 5 ML SYRINGE
INTRAMUSCULAR | Status: AC
  Filled 2020-01-01: qty 5

## 2020-01-01 MED ORDER — ONDANSETRON HCL 4 MG/2ML IJ SOLN
INTRAMUSCULAR | Status: AC
  Filled 2020-01-01: qty 2

## 2020-01-01 MED ORDER — POLYETHYLENE GLYCOL 3350 17 G PO PACK
17.0000 g | PACK | Freq: Every day | ORAL | Status: DC
  Administered 2020-01-02 – 2020-01-06 (×5): 17 g via ORAL
  Filled 2020-01-01 (×9): qty 1

## 2020-01-01 MED ORDER — ONDANSETRON HCL 4 MG/2ML IJ SOLN: 4.0000 mg | INTRAMUSCULAR | Status: DC | PRN

## 2020-01-01 MED ORDER — HYDROMORPHONE 1 MG/ML IV SOLN: INTRAVENOUS | Status: DC

## 2020-01-01 MED ORDER — LACTATED RINGERS IV SOLN: INTRAVENOUS | Status: DC | PRN

## 2020-01-01 MED ORDER — DIPHENHYDRAMINE HCL 50 MG/ML IJ SOLN: 12.5000 mg | Freq: Four times a day (QID) | INTRAMUSCULAR | Status: DC | PRN

## 2020-01-01 MED ORDER — ORAL CARE MOUTH RINSE: 15.0000 mL | Freq: Once | OROMUCOSAL | Status: AC

## 2020-01-01 MED ORDER — CHLORHEXIDINE GLUCONATE 0.12 % MT SOLN
15.0000 mL | OROMUCOSAL | Status: AC
  Administered 2020-01-01: 15 mL via OROMUCOSAL

## 2020-01-01 MED ORDER — LIDOCAINE HCL (PF) 1 % IJ SOLN
INTRAMUSCULAR | Status: AC
  Filled 2020-01-01: qty 30

## 2020-01-01 MED ORDER — SODIUM CHLORIDE 0.45 % IV SOLN: INTRAVENOUS | Status: DC

## 2020-01-01 MED ORDER — CHLORHEXIDINE GLUCONATE CLOTH 2 % EX PADS
6.0000 | MEDICATED_PAD | Freq: Every day | CUTANEOUS | Status: DC
  Administered 2020-01-01 – 2020-01-12 (×11): 6 via TOPICAL

## 2020-01-01 MED ORDER — DIPHENHYDRAMINE HCL 12.5 MG/5ML PO ELIX
12.5000 mg | ORAL_SOLUTION | Freq: Four times a day (QID) | ORAL | Status: DC | PRN
Start: 2020-01-01 — End: 2020-01-01

## 2020-01-01 MED ORDER — ROCURONIUM BROMIDE 10 MG/ML (PF) SYRINGE
PREFILLED_SYRINGE | INTRAVENOUS | Status: DC | PRN
  Administered 2020-01-01: 10 mg via INTRAVENOUS
  Administered 2020-01-01: 20 mg via INTRAVENOUS
  Administered 2020-01-01: 70 mg via INTRAVENOUS
  Administered 2020-01-01: 30 mg via INTRAVENOUS

## 2020-01-01 MED ORDER — ONDANSETRON HCL 4 MG/2ML IJ SOLN
INTRAMUSCULAR | Status: DC | PRN
  Administered 2020-01-01: 4 mg via INTRAVENOUS

## 2020-01-01 MED ORDER — ROCURONIUM BROMIDE 10 MG/ML (PF) SYRINGE
PREFILLED_SYRINGE | INTRAVENOUS | Status: AC
  Filled 2020-01-01: qty 10

## 2020-01-01 MED ORDER — NALOXONE HCL 0.4 MG/ML IJ SOLN: 0.4000 mg | INTRAMUSCULAR | Status: DC | PRN

## 2020-01-01 MED ORDER — HYDROMORPHONE HCL 1 MG/ML IJ SOLN: 0.5000 mg | INTRAMUSCULAR | Status: DC | PRN

## 2020-01-01 MED ORDER — DEXAMETHASONE SODIUM PHOSPHATE 10 MG/ML IJ SOLN
INTRAMUSCULAR | Status: DC | PRN
  Administered 2020-01-01: 5 mg via INTRAVENOUS

## 2020-01-01 MED ORDER — DEXMEDETOMIDINE HCL IN NACL 400 MCG/100ML IV SOLN
INTRAVENOUS | Status: DC | PRN
Start: 2020-01-01 — End: 2020-01-01
  Administered 2020-01-01: .4 ug/kg/h via INTRAVENOUS

## 2020-01-01 MED ORDER — PANTOPRAZOLE SODIUM 40 MG IV SOLR
40.0000 mg | Freq: Two times a day (BID) | INTRAVENOUS | Status: DC
  Administered 2020-01-01 – 2020-01-09 (×16): 40 mg via INTRAVENOUS
  Filled 2020-01-01 (×16): qty 40

## 2020-01-01 MED ORDER — FENTANYL CITRATE (PF) 250 MCG/5ML IJ SOLN
INTRAMUSCULAR | Status: AC
  Filled 2020-01-01: qty 5

## 2020-01-01 MED ORDER — PHENYLEPHRINE HCL-NACL 10-0.9 MG/250ML-% IV SOLN
INTRAVENOUS | Status: DC | PRN
  Administered 2020-01-01: 15 ug/min via INTRAVENOUS

## 2020-01-01 MED ORDER — LACTATED RINGERS IV SOLN: INTRAVENOUS | Status: DC

## 2020-01-01 MED ORDER — PHENYLEPHRINE 40 MCG/ML (10ML) SYRINGE FOR IV PUSH (FOR BLOOD PRESSURE SUPPORT)
PREFILLED_SYRINGE | INTRAVENOUS | Status: AC
  Filled 2020-01-01: qty 20

## 2020-01-01 MED ORDER — SODIUM CHLORIDE 0.9 % IV SOLN
2.0000 g | Freq: Four times a day (QID) | INTRAVENOUS | Status: AC
  Administered 2020-01-01 – 2020-01-02 (×3): 2 g via INTRAVENOUS
  Filled 2020-01-01 (×3): qty 2

## 2020-01-01 MED ORDER — PHENYLEPHRINE 40 MCG/ML (10ML) SYRINGE FOR IV PUSH (FOR BLOOD PRESSURE SUPPORT)
PREFILLED_SYRINGE | INTRAVENOUS | Status: DC | PRN
  Administered 2020-01-01 (×13): 80 ug via INTRAVENOUS

## 2020-01-01 MED ORDER — INSULIN REGULAR(HUMAN) IN NACL 100-0.9 UT/100ML-% IV SOLN
INTRAVENOUS | Status: DC | PRN
  Administered 2020-01-01: 8.5 [IU]/h via INTRAVENOUS

## 2020-01-01 MED ORDER — PROPOFOL 10 MG/ML IV BOLUS
INTRAVENOUS | Status: AC
  Filled 2020-01-01: qty 40

## 2020-01-01 MED ORDER — DEXAMETHASONE SODIUM PHOSPHATE 10 MG/ML IJ SOLN
INTRAMUSCULAR | Status: AC
  Filled 2020-01-01: qty 1

## 2020-01-01 MED ORDER — MORPHINE SULFATE (PF) 2 MG/ML IV SOLN
1.0000 mg | INTRAVENOUS | Status: DC | PRN
  Administered 2020-01-02: 2 mg via INTRAVENOUS
  Filled 2020-01-01: qty 2

## 2020-01-01 MED ORDER — ONDANSETRON HCL 4 MG/2ML IJ SOLN: 4.0000 mg | Freq: Four times a day (QID) | INTRAMUSCULAR | Status: DC | PRN

## 2020-01-01 MED ORDER — CHLORHEXIDINE GLUCONATE 0.12 % MT SOLN
15.0000 mL | Freq: Once | OROMUCOSAL | Status: AC
  Administered 2020-01-01: 15 mL via OROMUCOSAL
  Filled 2020-01-01: qty 15

## 2020-01-01 MED ORDER — SODIUM CHLORIDE 0.9 % IV SOLN
2.0000 g | INTRAVENOUS | Status: AC
  Administered 2020-01-01 (×4): 2 g via INTRAVENOUS
  Filled 2020-01-01 (×2): qty 2

## 2020-01-01 MED ORDER — 0.9 % SODIUM CHLORIDE (POUR BTL) OPTIME
TOPICAL | Status: DC | PRN
  Administered 2020-01-01: 2000 mL

## 2020-01-01 MED ORDER — DIPHENHYDRAMINE HCL 12.5 MG/5ML PO ELIX: 12.5000 mg | ORAL_SOLUTION | Freq: Four times a day (QID) | ORAL | Status: DC | PRN

## 2020-01-01 MED ORDER — DOCUSATE SODIUM 50 MG/5ML PO LIQD
100.0000 mg | Freq: Two times a day (BID) | ORAL | Status: DC
  Administered 2020-01-01 – 2020-01-08 (×15): 100 mg via ORAL
  Filled 2020-01-01 (×20): qty 10

## 2020-01-01 MED ORDER — DIPHENHYDRAMINE HCL 50 MG/ML IJ SOLN
12.5000 mg | Freq: Four times a day (QID) | INTRAMUSCULAR | Status: DC | PRN
Start: 2020-01-01 — End: 2020-01-01

## 2020-01-01 MED ORDER — MIDAZOLAM HCL 2 MG/2ML IJ SOLN
INTRAMUSCULAR | Status: AC
  Filled 2020-01-01: qty 2

## 2020-01-01 MED ORDER — DEXMEDETOMIDINE HCL IN NACL 400 MCG/100ML IV SOLN
0.0000 ug/kg/h | INTRAVENOUS | Status: DC
Start: 2020-01-01 — End: 2020-01-01

## 2020-01-01 MED ORDER — ALBUTEROL SULFATE (2.5 MG/3ML) 0.083% IN NEBU: 2.5000 mg | INHALATION_SOLUTION | Freq: Four times a day (QID) | RESPIRATORY_TRACT | Status: AC | PRN

## 2020-01-01 MED ORDER — PHENYLEPHRINE HCL (PRESSORS) 10 MG/ML IV SOLN
INTRAVENOUS | Status: AC
  Filled 2020-01-01: qty 1

## 2020-01-01 MED ORDER — INSULIN ASPART 100 UNIT/ML ~~LOC~~ SOLN
0.0000 [IU] | SUBCUTANEOUS | Status: DC
  Administered 2020-01-01 – 2020-01-03 (×9): 2 [IU] via SUBCUTANEOUS
  Administered 2020-01-03: 4 [IU] via SUBCUTANEOUS
  Administered 2020-01-03 – 2020-01-05 (×12): 2 [IU] via SUBCUTANEOUS
  Administered 2020-01-05: 4 [IU] via SUBCUTANEOUS
  Administered 2020-01-05 – 2020-01-06 (×3): 2 [IU] via SUBCUTANEOUS
  Administered 2020-01-06: 4 [IU] via SUBCUTANEOUS
  Administered 2020-01-06 (×2): 2 [IU] via SUBCUTANEOUS
  Administered 2020-01-07: 4 [IU] via SUBCUTANEOUS
  Administered 2020-01-07: 2 [IU] via SUBCUTANEOUS
  Administered 2020-01-07: 4 [IU] via SUBCUTANEOUS
  Administered 2020-01-07: 2 [IU] via SUBCUTANEOUS
  Administered 2020-01-07: 4 [IU] via SUBCUTANEOUS
  Administered 2020-01-08: 2 [IU] via SUBCUTANEOUS
  Administered 2020-01-08 (×2): 4 [IU] via SUBCUTANEOUS
  Administered 2020-01-08: 2 [IU] via SUBCUTANEOUS
  Administered 2020-01-08 – 2020-01-09 (×2): 4 [IU] via SUBCUTANEOUS
  Administered 2020-01-09 (×2): 8 [IU] via SUBCUTANEOUS
  Administered 2020-01-09 (×2): 2 [IU] via SUBCUTANEOUS
  Administered 2020-01-09 – 2020-01-10 (×3): 4 [IU] via SUBCUTANEOUS
  Administered 2020-01-10 (×2): 8 [IU] via SUBCUTANEOUS
  Administered 2020-01-10 (×2): 2 [IU] via SUBCUTANEOUS
  Administered 2020-01-10 – 2020-01-11 (×2): 4 [IU] via SUBCUTANEOUS
  Administered 2020-01-11: 8 [IU] via SUBCUTANEOUS

## 2020-01-01 MED ORDER — DEXMEDETOMIDINE HCL IN NACL 400 MCG/100ML IV SOLN: 0.0000 ug/kg/h | INTRAVENOUS | Status: DC

## 2020-01-01 MED ORDER — FENTANYL CITRATE (PF) 250 MCG/5ML IJ SOLN
INTRAMUSCULAR | Status: DC | PRN
  Administered 2020-01-01 (×4): 50 ug via INTRAVENOUS
  Administered 2020-01-01: 100 ug via INTRAVENOUS
  Administered 2020-01-01 (×5): 50 ug via INTRAVENOUS
  Administered 2020-01-01: 100 ug via INTRAVENOUS
  Administered 2020-01-01 (×2): 50 ug via INTRAVENOUS

## 2020-01-01 MED ORDER — MIDAZOLAM HCL 5 MG/5ML IJ SOLN
INTRAMUSCULAR | Status: DC | PRN
  Administered 2020-01-01: 2 mg via INTRAVENOUS

## 2020-01-01 MED ORDER — PHENYLEPHRINE HCL-NACL 10-0.9 MG/250ML-% IV SOLN
0.0000 ug/min | INTRAVENOUS | Status: DC
  Administered 2020-01-01: 30 ug/min via INTRAVENOUS
  Administered 2020-01-02: 70 ug/min via INTRAVENOUS
  Administered 2020-01-02: 10 ug/min via INTRAVENOUS
  Administered 2020-01-02: 20 ug/min via INTRAVENOUS
  Filled 2020-01-01 (×4): qty 250

## 2020-01-01 MED ORDER — SODIUM CHLORIDE 0.9 % IV SOLN
2.0000 g | Freq: Once | INTRAVENOUS | Status: DC
  Filled 2020-01-01 (×3): qty 2

## 2020-01-01 MED ORDER — ALBUTEROL SULFATE HFA 108 (90 BASE) MCG/ACT IN AERS: 2.0000 | INHALATION_SPRAY | Freq: Four times a day (QID) | RESPIRATORY_TRACT | Status: DC | PRN

## 2020-01-01 MED ORDER — HYDROMORPHONE 1 MG/ML IV SOLN
INTRAVENOUS | Status: DC
  Administered 2020-01-01: 30 mg via INTRAVENOUS
  Administered 2020-01-02: 2 mg via INTRAVENOUS
  Administered 2020-01-02: 1.3 mg via INTRAVENOUS
  Administered 2020-01-02: 1.6 mg via INTRAVENOUS
  Administered 2020-01-02: 2.3 mg via INTRAVENOUS
  Administered 2020-01-02: 1.4 mg via INTRAVENOUS
  Administered 2020-01-02 (×2): 1.6 mg via INTRAVENOUS
  Administered 2020-01-03: 2 mg via INTRAVENOUS
  Administered 2020-01-03 (×2): 1 mg via INTRAVENOUS
  Administered 2020-01-03: 0.8 mg via INTRAVENOUS
  Administered 2020-01-03: 1.6 mg via INTRAVENOUS
  Administered 2020-01-03: 1.2 mg via INTRAVENOUS
  Administered 2020-01-04: 2.6 mg via INTRAVENOUS
  Administered 2020-01-04: 1.8 mg via INTRAVENOUS
  Administered 2020-01-04: 1.4 mg via INTRAVENOUS
  Administered 2020-01-04: 3.2 mg via INTRAVENOUS
  Administered 2020-01-04: 30 mg via INTRAVENOUS
  Administered 2020-01-05: 1.8 mg via INTRAVENOUS
  Administered 2020-01-05: 1.4 mg via INTRAVENOUS
  Administered 2020-01-05: 2 mg via INTRAVENOUS
  Administered 2020-01-05: 1.8 mg via INTRAVENOUS
  Administered 2020-01-05: 1.2 mg via INTRAVENOUS
  Administered 2020-01-05: 0.8 mg via INTRAVENOUS
  Administered 2020-01-06: 0.2 mg via INTRAVENOUS
  Administered 2020-01-06: 2 mg via INTRAVENOUS
  Filled 2020-01-01 (×3): qty 30

## 2020-01-01 SURGICAL SUPPLY — 141 items
ADAPTER VALVE BIOPSY EBUS (MISCELLANEOUS) IMPLANT
ADH SKN CLS APL DERMABOND .7 (GAUZE/BANDAGES/DRESSINGS) ×6
ADPTR VALVE BIOPSY EBUS (MISCELLANEOUS)
BLADE CLIPPER SURG (BLADE) ×4 IMPLANT
BRUSH CYTOL CELLEBRITY 1.5X140 (MISCELLANEOUS) IMPLANT
CANISTER SUCT 3000ML PPV (MISCELLANEOUS) ×8 IMPLANT
CATH FOLEY 2WAY SLVR 18FR 30CC (CATHETERS) ×4 IMPLANT
CATH ROBINSON RED A/P 18FR (CATHETERS) ×1 IMPLANT
CATH THORACIC 28FR (CATHETERS) ×4 IMPLANT
CLIP FOGARTY SPRING 6M (CLIP) ×4 IMPLANT
CLIP VESOCCLUDE LG 6/CT (CLIP) ×1 IMPLANT
CLIP VESOCCLUDE MED 24/CT (CLIP) ×4 IMPLANT
CLIP VESOCCLUDE SM WIDE 24/CT (CLIP) ×4 IMPLANT
CNTNR URN SCR LID CUP LEK RST (MISCELLANEOUS) ×3 IMPLANT
CONT SPEC 4OZ STRL OR WHT (MISCELLANEOUS) ×4
COUNTER NEEDLE 20 DBL MAG RED (NEEDLE) ×4 IMPLANT
COVER BACK TABLE 60X90IN (DRAPES) ×4 IMPLANT
COVER PROBE W GEL 5X96 (DRAPES) ×8 IMPLANT
DERMABOND ADVANCED (GAUZE/BANDAGES/DRESSINGS) ×2
DERMABOND ADVANCED .7 DNX12 (GAUZE/BANDAGES/DRESSINGS) ×6 IMPLANT
DRAIN CHANNEL 28F RND 3/8 FF (WOUND CARE) ×4 IMPLANT
DRAIN PENROSE 1/2X36 LTX STRL (DRAIN) ×4 IMPLANT
DRAIN PENROSE 1/4X12 LTX STRL (WOUND CARE) ×1 IMPLANT
DRAPE CV SPLIT W-CLR ANES SCRN (DRAPES) ×4 IMPLANT
DRAPE INCISE IOBAN 66X45 STRL (DRAPES) ×5 IMPLANT
DRAPE PERI GROIN 82X75IN TIB (DRAPES) ×4 IMPLANT
DRAPE SLUSH/WARMER DISC (DRAPES) ×1 IMPLANT
DRESSING AQUACEL AG SP 3.5X10 (GAUZE/BANDAGES/DRESSINGS) IMPLANT
DRSG AQUACEL AG ADV 3.5X 6 (GAUZE/BANDAGES/DRESSINGS) ×1 IMPLANT
DRSG AQUACEL AG ADV 3.5X14 (GAUZE/BANDAGES/DRESSINGS) ×4 IMPLANT
DRSG AQUACEL AG SP 3.5X10 (GAUZE/BANDAGES/DRESSINGS) ×4
ELECT BLADE 4.0 EZ CLEAN MEGAD (MISCELLANEOUS) ×4
ELECT BLADE 6.5 EXT (BLADE) ×1 IMPLANT
ELECT NDL BLADE 2-5/6 (NEEDLE) IMPLANT
ELECT NDL TIP 2.8 STRL (NEEDLE) ×3 IMPLANT
ELECT NEEDLE BLADE 2-5/6 (NEEDLE) ×4 IMPLANT
ELECT NEEDLE TIP 2.8 STRL (NEEDLE) ×4 IMPLANT
ELECT REM PT RETURN 9FT ADLT (ELECTROSURGICAL) ×8
ELECTRODE BLDE 4.0 EZ CLN MEGD (MISCELLANEOUS) ×3 IMPLANT
ELECTRODE REM PT RTRN 9FT ADLT (ELECTROSURGICAL) ×6 IMPLANT
FILTER SMOKE EVAC ULPA (FILTER) ×4 IMPLANT
FORCEPS BIOP RJ4 1.8 (CUTTING FORCEPS) IMPLANT
GAUZE SPONGE 4X4 12PLY STRL (GAUZE/BANDAGES/DRESSINGS) ×5 IMPLANT
GLOVE BIO SURGEON STRL SZ 6 (GLOVE) ×2 IMPLANT
GLOVE BIO SURGEON STRL SZ 6.5 (GLOVE) ×15 IMPLANT
GLOVE SURG SS PI 7.5 STRL IVOR (GLOVE) ×1 IMPLANT
GOWN STRL REUS W/ TWL LRG LVL3 (GOWN DISPOSABLE) ×9 IMPLANT
GOWN STRL REUS W/TWL LRG LVL3 (GOWN DISPOSABLE) ×12
HANDLE STAPLE ENDO GIA SHORT (STAPLE) ×1
HANDLE SUCTION POOLE (INSTRUMENTS) IMPLANT
HEMOSTAT SURGICEL 2X14 (HEMOSTASIS) IMPLANT
INSERT FOGARTY 61MM (MISCELLANEOUS) IMPLANT
KIT BASIN OR (CUSTOM PROCEDURE TRAY) ×4 IMPLANT
KIT CLEAN ENDO COMPLIANCE (KITS) ×4 IMPLANT
KIT SUCTION CATH 14FR (SUCTIONS) ×4 IMPLANT
KIT TURNOVER KIT B (KITS) ×4 IMPLANT
LOOP VESSEL MINI RED (MISCELLANEOUS) IMPLANT
MARKER SKIN DUAL TIP RULER LAB (MISCELLANEOUS) IMPLANT
NS IRRIG 1000ML POUR BTL (IV SOLUTION) ×8 IMPLANT
OIL SILICONE PENTAX (PARTS (SERVICE/REPAIRS)) ×4 IMPLANT
PACK CHEST (CUSTOM PROCEDURE TRAY) ×4 IMPLANT
PAD ARMBOARD 7.5X6 YLW CONV (MISCELLANEOUS) ×8 IMPLANT
PENCIL BUTTON HOLSTER BLD 10FT (ELECTRODE) IMPLANT
PENCIL SMOKE EVACUATOR (MISCELLANEOUS) ×4 IMPLANT
PLUG CATH AND CAP STER (CATHETERS) IMPLANT
PROBE NERVBE PRASS .33 (MISCELLANEOUS) ×5 IMPLANT
RELOAD EGIA 45 MED/THCK PURPLE (STAPLE) IMPLANT
RELOAD EGIA TRIS TAN 45 CVD (STAPLE) ×4 IMPLANT
RELOAD LINEAR CUT PROX 55 BLUE (ENDOMECHANICALS) ×24 IMPLANT
RELOAD STAPLE 30 PURP MED/THCK (STAPLE) IMPLANT
RELOAD STAPLE 35X2.5 WHT THIN (STAPLE) IMPLANT
RELOAD STAPLE 45 TAN MED CVD (STAPLE) IMPLANT
RELOAD STAPLE 55 3.8 BLU REG (ENDOMECHANICALS) ×9 IMPLANT
RELOAD TRI 2.0 30 MED THCK SUL (STAPLE) ×4 IMPLANT
RETAINER VISCERA MED (MISCELLANEOUS) ×3 IMPLANT
RETRACTOR WND ALEXIS 25 LRG (MISCELLANEOUS) ×3 IMPLANT
RTRCTR WOUND ALEXIS 25CM LRG (MISCELLANEOUS) ×4
SEALER TISSUE X1 CVD JAW (INSTRUMENTS) ×4 IMPLANT
SHEARS HARMONIC ACE PLUS 36CM (ENDOMECHANICALS) ×4 IMPLANT
SHEARS HARMONIC HDI 20CM (ELECTROSURGICAL) IMPLANT
SLEEVE SUCTION 125 (MISCELLANEOUS) ×5 IMPLANT
SLEEVE SUCTION CATH 165 (SLEEVE) ×1 IMPLANT
SPONGE INTESTINAL PEANUT (DISPOSABLE) ×1 IMPLANT
SPONGE LAP 18X18 RF (DISPOSABLE) ×8 IMPLANT
STAPLE RELOAD 2.5MM WHITE (STAPLE) IMPLANT
STAPLER ENDO GIA 12 SHRT THIN (STAPLE) ×3 IMPLANT
STAPLER ENDO GIA 12MM SHORT (STAPLE) ×3 IMPLANT
STAPLER PROXIMATE 55 BLUE (STAPLE) ×4 IMPLANT
STAPLER VASCULAR ECHELON 35 (CUTTER) IMPLANT
STAPLER VISISTAT 35W (STAPLE) IMPLANT
SUCTION POOLE HANDLE (INSTRUMENTS)
SUT ETHILON 3 0 FSL (SUTURE) ×6 IMPLANT
SUT PDS AB 1 TP1 96 (SUTURE) ×8 IMPLANT
SUT PDS AB 4-0 SH 27 (SUTURE) IMPLANT
SUT PROLENE 0 CT 1 CR/8 (SUTURE) IMPLANT
SUT PROLENE 2 0 MH 48 (SUTURE) IMPLANT
SUT PROLENE 3 0 RB 1 (SUTURE) ×4 IMPLANT
SUT PROLENE 3 0 SH1 36 (SUTURE) ×4 IMPLANT
SUT PROLENE 4 0 RB 1 (SUTURE)
SUT PROLENE 4-0 RB1 .5 CRCL 36 (SUTURE) IMPLANT
SUT SILK  1 MH (SUTURE) ×12
SUT SILK 1 MH (SUTURE) ×3 IMPLANT
SUT SILK 1 TIES 10X30 (SUTURE) ×4 IMPLANT
SUT SILK 2 0 SH CR/8 (SUTURE) ×9 IMPLANT
SUT SILK 2 0SH CR/8 30 (SUTURE) ×5 IMPLANT
SUT SILK 3 0 SH CR/8 (SUTURE) ×1 IMPLANT
SUT SILK 3 0SH CR/8 30 (SUTURE) IMPLANT
SUT SILK 4 0 SH CR/8 (SUTURE) ×5 IMPLANT
SUT VIC AB 1 CTX 27 (SUTURE) IMPLANT
SUT VIC AB 2-0 CT1 18 (SUTURE) IMPLANT
SUT VIC AB 2-0 CTX 36 (SUTURE) ×5 IMPLANT
SUT VIC AB 3-0 SH 18 (SUTURE) IMPLANT
SUT VIC AB 3-0 X1 27 (SUTURE) ×8 IMPLANT
SUT VIC AB 4-0 SH 18 (SUTURE) ×14 IMPLANT
SUT VICRYL 2 TP 1 (SUTURE) IMPLANT
SYR 10ML LL (SYRINGE) IMPLANT
SYR 20ML ECCENTRIC (SYRINGE) ×4 IMPLANT
SYR 20ML LL LF (SYRINGE) IMPLANT
SYR 30ML LL (SYRINGE) ×4 IMPLANT
SYR 30ML SLIP (SYRINGE) ×4 IMPLANT
SYR 5ML LUER SLIP (SYRINGE) IMPLANT
SYR TOOMEY 50ML (SYRINGE) ×4 IMPLANT
SYSTEM SAHARA CHEST DRAIN ATS (WOUND CARE) ×4 IMPLANT
TAPE CLOTH SURG 4X10 WHT LF (GAUZE/BANDAGES/DRESSINGS) ×2 IMPLANT
TAPE UMBILICAL 1/8 X36 TWILL (MISCELLANEOUS) IMPLANT
TOWEL GREEN STERILE (TOWEL DISPOSABLE) ×4 IMPLANT
TOWEL GREEN STERILE FF (TOWEL DISPOSABLE) ×4 IMPLANT
TRAP FLUID SMOKE EVACUATOR (MISCELLANEOUS) ×4 IMPLANT
TRAP SPECIMEN MUCUS 40CC (MISCELLANEOUS) IMPLANT
TRAY FOLEY MTR SLVR 16FR STAT (SET/KITS/TRAYS/PACK) ×4 IMPLANT
TUBE CONNECTING 12X1/4 (SUCTIONS) ×1 IMPLANT
TUBE CONNECTING 20X1/4 (TUBING) ×4 IMPLANT
TUBE ENDOTRAC EMG 7X10.2 (MISCELLANEOUS) IMPLANT
TUBE ENDOTRAC EMG 8X11.3 (MISCELLANEOUS) ×1 IMPLANT
TUBE ENDOTRACH  EMG 6MMTUBE EN (MISCELLANEOUS)
TUBE ENDOTRACH EMG 6MMTUBE EN (MISCELLANEOUS) IMPLANT
TUBE J 18FR (TUBING) IMPLANT
VALVE BIOPSY  SINGLE USE (MISCELLANEOUS) ×4
VALVE BIOPSY SINGLE USE (MISCELLANEOUS) ×3 IMPLANT
VALVE SUCTION BRONCHIO DISP (MISCELLANEOUS) ×4 IMPLANT
WATER STERILE IRR 1000ML POUR (IV SOLUTION) ×4 IMPLANT

## 2020-01-01 NOTE — Anesthesia Postprocedure Evaluation (Signed)
Anesthesia Post Note  Patient: James Dunn  Procedure(s) Performed: VIDEO BRONCHOSCOPY WITH BRONCHIAL WASHING FOR CULTURES AND GRAM STAIN (N/A ) TRANSHIATAL TOTAL ESOPHAGECTOMY COMPLETE WITH NIMS TUBE (N/A Abdomen) JEJUNOSTOMY (N/A ) CHEST TUBE INSERTION (Left )     Patient location during evaluation: SICU Anesthesia Type: General Level of consciousness: sedated Pain management: pain level controlled Vital Signs Assessment: post-procedure vital signs reviewed and stable Respiratory status: patient remains intubated per anesthesia plan Cardiovascular status: stable Postop Assessment: no apparent nausea or vomiting Anesthetic complications: no   No complications documented.  Last Vitals:  Vitals:   01/01/20 1745 01/01/20 1800  BP: (!) 151/86 (!) 150/85  Pulse: (!) 56 (!) 55  Resp: 18 18  Temp:    SpO2: 100% 100%    Last Pain:  Vitals:   01/01/20 0707  TempSrc:   PainSc: 0-No pain                 Effie Berkshire

## 2020-01-01 NOTE — Anesthesia Procedure Notes (Signed)
Arterial Line Insertion Start/End7/14/2021 7:30 AM, 01/01/2020 7:35 AM Performed by: Junie Bame, RN  Patient location: Pre-op. Preanesthetic checklist: patient identified, IV checked, site marked, risks and benefits discussed, surgical consent, monitors and equipment checked, pre-op evaluation, timeout performed and anesthesia consent Lidocaine 1% used for infiltration Right, radial was placed Catheter size: 20 G Hand hygiene performed  and maximum sterile barriers used   Attempts: 1 Procedure performed without using ultrasound guided technique. Following insertion, dressing applied and Biopatch. Post procedure assessment: normal and unchanged

## 2020-01-01 NOTE — H&P (Signed)
[address]       [place] [address]             [phone]                    James Dunn [place] Medical Record #[id number] Date of Birth: 1950-07-29  Referring: [name], MD Primary Care: Denita Lung, MD Primary Cardiologist:Dr Gwenlyn Found  Chief Complaint:    Chief Complaint  Patient presents with  . Esophageal Cancer     History of Present Illness:    James Dunn 69 y.o. male followed  in the office for  distal esophageal adenocarcinoma.  He   presented  With  food hanging up especially meat when he tried to swallow up.  He also noted hiccuping with eating.  He had approximately 10 pound weight loss over  2 months from 185-174.  With these symptoms he underwent barium swallow upper endoscopy and was noted to have a distal esophageal mass circumferential.  Biopsy confirmed adenocarcinoma.  Esophageal ultrasound was done staged T3N3 .    The patient  started on weekly chemotherapy and daily radiation therapy starting April 14.  His last day of weekly chemo was 5/17 last radiation therapy was on 5/19  The patient is taking p.o. diet relatively well, pain with swallowing has markedly improved.  He notes he was able to eat steak without feeling like food sticking.  The patient is a previous heavy smoker for more than 40 years, but notes that he quit in October 2020.  He has no prior history of hypertension myocardial infarction angina denies hyperlipidemia previous stroke peripheral vascular disease or renal vascular disease, he does note oral agent controlled diabetes.  CT scans noted significant left main calcification coronary artery calcifications.-He was seen by cardiology and stress test was performed  "Negative perfusion study. No further work up. The patient is not going for a high risk surgery.  Therefore, based on ACC/AHA guidelines, the patient would be at acceptable risk for the planned procedure without further cardiovascular testing. Call  James Dunn with the results and send results to Denita Lung, MD"   Cancer Staging Esophageal cancer [facility]) Staging form: Esophagus - Adenocarcinoma, AJCC 8th Edition - Clinical stage from 09/13/2019: Stage IVA (cT3, cN2, cM0) - Signed by [name], MD on 09/22/2019  SURGICAL PATHOLOGY  CASE: [id number]  PATIENT: James Dunn  Surgical Pathology Report  Clinical History: Dysphagia / abnormal barium swallow-evaluate for  malignancy (cm)  FINAL MICROSCOPIC DIAGNOSIS:  A. ESOPHAGUS, BIOPSY:  - At least intramucosal adenocarcinoma.  - See comment.  COMMENT:  - The depth of invasion can not be determined due to the superficial  nature of the biopsy. Dr. Vic Ripper has reviewed the case and concurs  with this interpretation. Additional studies can be performed upon  clinician request. Dr. [name] was paged on 09/03/2019.   Final Diagnosis performed by Enid Cutter, MD.  Electronically signed  09/03/2019  Current Activity/ Functional Status:  Patient is independent with mobility/ambulation, transfers, ADL's, IADL's.   Zubrod Score: At the time of surgery this patient's most appropriate activity status/level should be described as: []     0    Normal activity, no symptoms [x]     1    Restricted in physical strenuous activity but ambulatory, able to do out light work []     2    Ambulatory and capable of self care, unable to do work activities, up  and about               >50 % of waking hours                              []     3    Only limited self care, in bed greater than 50% of waking hours []     4    Completely disabled, no self care, confined to bed or chair []     5    Moribund   Past Medical History:  Diagnosis Date  . Allergy   . Asthma   . Controlled type 2 diabetes mellitus without complication, without long-term current use of insulin ([place]) 06/25/2012  . Diverticulosis   . ED (erectile dysfunction) 12/28/2010  . Esophageal cancer ([place]) 09/22/2019  . Former smoker 06/25/2012  .  GERD (gastroesophageal reflux disease) 2002  . History of colonic polyps 05/11/2016  . Hyperlipidemia associated with type 2 diabetes mellitus ([place]) 05/11/2016  . Hypertension associated with diabetes ([place]) 08/02/2017  . Mild intermittent asthma without complication 1/47/8295  . Seasonal allergic rhinitis due to pollen 11/29/2016  . Tubular adenoma of colon 12/05/2016    Past Surgical History:  Procedure Laterality Date  . BIOPSY  08/30/2019   Procedure: BIOPSY;  Surgeon: [name], MD;  Location: [facility];  Service: Endoscopy;;  . ESOPHAGOGASTRODUODENOSCOPY (EGD) WITH PROPOFOL N/A 08/30/2019   Procedure: ESOPHAGOGASTRODUODENOSCOPY (EGD) WITH PROPOFOL;  Surgeon: [name], MD;  Location: [facility];  Service: Endoscopy;  Laterality: N/A;  . ESOPHAGOGASTRODUODENOSCOPY (EGD) WITH PROPOFOL N/A 09/13/2019   Procedure: ESOPHAGOGASTRODUODENOSCOPY (EGD) WITH PROPOFOL;  Surgeon: [name], MD;  Location: [facility];  Service: Endoscopy;  Laterality: N/A;  . SKIN BIOPSY N/A 03/04/2019   upper back Nerofibroma and follicular cyst   . SUBMUCOSAL INJECTION  08/30/2019   Procedure: SUBMUCOSAL INJECTION;  Surgeon: [name], MD;  Location: [facility];  Service: Endoscopy;;  . UPPER ESOPHAGEAL ENDOSCOPIC ULTRASOUND (EUS) N/A 09/13/2019   Procedure: UPPER ESOPHAGEAL ENDOSCOPIC ULTRASOUND (EUS);  Surgeon: [name], MD;  Location: [facility];  Service: Endoscopy;  Laterality: N/A;    Family History  Problem Relation Age of Onset  . Emphysema Mother   . AAA (abdominal aortic aneurysm) Father      Social History   Tobacco Use  Smoking Status Former Smoker  . Types: Cigarettes  . Quit date: 03/21/2019  . Years since quitting: 0.7  Smokeless Tobacco Never Used    Social History   Substance and Sexual Activity  Alcohol Use Not Currently  . Alcohol/week: 7.0 standard drinks  . Types: 7 Cans of beer per week     No Known Allergies  Current Facility-Administered  Medications  Medication Dose Route Frequency Provider Last Rate Last Admin  . cefOXitin (MEFOXIN) 2 g in sodium chloride 0.9 % 100 mL IVPB  2 g Intravenous On Call to OR [name], MD      . lactated ringers infusion   Intravenous Continuous Effie Berkshire, MD        Pertinent items are noted in HPI.   Review of Systems:     Cardiac Review of Systems: [Y] = yes  or   [ N ] = no   Chest Pain [n ]  Resting SOB [   n] Exertional SOB  [n  ]  Orthopnea Florencio.Farrier ]   Pedal Edema [  n]    Palpitations [ n] Syncope  [  n   Presyncope [n ]   General Review of Systems: [Y] = yes [  ]=no Constitional: recent weight change [  ];  Wt loss over the last 3 months [   ] anorexia [  ]; fatigue [  ]; nausea [  ]; night sweats [  ]; fever [  ]; or chills [  ];           Eye : blurred vision [  ]; diplopia [   ]; vision changes [  ];  Amaurosis fugax[  ]; Resp: cough [  ];  wheezing[  ];  hemoptysis[  ]; shortness of breath[  ]; paroxysmal nocturnal dyspnea[  ]; dyspnea on exertion[  ]; or orthopnea[  ];  GI:  gallstones[  ], vomiting[Y  ];  dysphagia[ Y ]; melena[  ];  hematochezia [  ]; heartburn[ Y ];   Hx of  Colonoscopy[  ]; GU: kidney stones [  ]; hematuria[  ];   dysuria [  ];  nocturia[  ];  history of     obstruction [  ]; urinary frequency [  ]             Skin: rash, swelling[  ];, hair loss[  ];  peripheral edema[  ];  or itching[  ]; Musculosketetal: myalgias[  ];  joint swelling[  ];  joint erythema[  ];  joint pain[  ];  back pain[  ];  Heme/Lymph: bruising[  ];  bleeding[  ];  anemia[  ];  Neuro: TIA[  ];  headaches[  ];  stroke[  ];  vertigo[  ];  seizures[  ];   paresthesias[  ];  difficulty walking[  ];  Psych:depression[  ]; anxiety[  ];  Endocrine: diabetes[  ];  thyroid dysfunction[  ];  Immunizations: Flu up to date Blue.Reese  ]; Pneumococcal up to date [ n ];  Other:  Wt Readings from Last 3 Encounters:  01/01/20 72.6 kg  12/30/19 72.3 kg  12/30/19 72.5 kg    Pre treatment  185  PHYSICAL EXAMINATION: BP 118/63   Pulse 69   Temp 98.5 F (36.9 C) (Oral)   Resp 17   Ht 5\' 7"  (1.702 m)   Wt 72.6 kg   SpO2 98%   BMI 25.06 kg/m  General appearance: alert, cooperative, appears stated age and no distress Neck: no adenopathy, no carotid bruit, no JVD, supple, symmetrical, trachea midline and thyroid not enlarged, symmetric, no tenderness/mass/nodules Lymph nodes: Cervical, supraclavicular, and axillary nodes normal. Resp: clear to auscultation bilaterally Cardio: regular rate and rhythm, S1, S2 normal, no murmur, click, rub or gallop GI: soft, non-tender; bowel sounds normal; no masses,  no organomegaly Extremities: extremities normal, atraumatic, no cyanosis or edema Neurologic: Grossly normal   Diagnostic Studies & Laboratory data:     Recent Radiology Findings:  DG Chest 2 View  Result Date: 12/30/2019 CLINICAL DATA:  Smoking history EXAM: CHEST - 2 VIEW COMPARISON:  CT 12/05/2019 FINDINGS: The heart size and mediastinal contours are within normal limits. Both lungs are clear. The visualized skeletal structures are unremarkable. IMPRESSION: No active cardiopulmonary disease. Electronically Signed   By: Davina Poke D.O.   On: 12/30/2019 15:50  CT Chest W Contrast  CT ABDOMEN W CONTRAST  Result Date: 12/05/2019 CLINICAL DATA:  Esophageal cancer, restaging. EXAM: CT CHEST AND ABDOMEN WITH CONTRAST TECHNIQUE: Multidetector CT imaging of the chest and abdomen was performed following the standard protocol during bolus administration of intravenous contrast.  CONTRAST:  167mL ISOVUE-300 IOPAMIDOL (ISOVUE-300) INJECTION 61% COMPARISON:  PET-CT 09/20/2019 FINDINGS: CT CHEST FINDINGS Cardiovascular: The heart size appears normal. Aortic atherosclerosis. Left main, lad, left circumflex and RCA coronary artery calcifications. Mediastinum/Nodes: Normal appearance of the thyroid gland. The trachea appears patent and is midline. Distal esophageal wall thickening has  improved from previous exam. On today's exam the distal esophagus underlying lesion measures 2.5 x 2.3 by 4.7 cm (volume = 14 cm^3), image 101/9 and image 57/3. On the previous exam using the same measuring scheme this measured 3.2 x 3.2 by 5.6 cm (volume = 30 cm^3). No enlarged supraclavicular, axillary, mediastinal, or hilar lymph nodes. Calcified left hilar lymph nodes identified compatible with prior granulomatous disease. Lungs/Pleura: No pleural effusion identified. Paraseptal and centrilobular emphysema. No airspace consolidation, atelectasis or pneumothorax. Calcified granulomas identified within the anterolateral left lower lobe. No suspicious pulmonary nodule or mass identified. Musculoskeletal: No chest wall mass or suspicious bone lesions identified. CT ABDOMEN FINDINGS Hepatobiliary: No focal liver abnormality is seen. No gallstones, gallbladder wall thickening, or biliary dilatation. Pancreas: Unremarkable. No pancreatic ductal dilatation or surrounding inflammatory changes. Spleen: Normal in size without focal abnormality. Adrenals/Urinary Tract: Normal appearance of the adrenal glands. Low-density structure within lateral cortex of right kidney measures 5 mm and is too small to reliably characterize. No hydronephrosis identified bilaterally. Stomach/Bowel: The stomach appears nondistended. No bowel wall thickening, inflammation or distension. Left-sided colonic diverticula noted. Vascular/Lymphatic: Aortic atherosclerosis. Similar appearance of upper limits of normal right retro peritoneal lymph nodes. No adenopathy. Other: No free fluid or fluid collections. Musculoskeletal: Spondylosis identified. IMPRESSION: 1. Interval response to therapy. Approximately 50% reduction in size of distal esophageal mass 2. No new or progressive disease identified within the chest or abdomen. 3. Emphysema and aortic atherosclerosis. 4. Left main and 3 vessel coronary artery calcifications. Aortic Atherosclerosis  (ICD10-I70.0) and Emphysema (ICD10-J43.9). Electronically Signed   By: Kerby Moors M.D.   On: 12/05/2019 09:07    CLINICAL DATA:  New diagnosis of esophageal cancer.  EXAM: CT CHEST, ABDOMEN, AND PELVIS WITH CONTRAST  TECHNIQUE: Multidetector CT imaging of the chest, abdomen and pelvis was performed following the standard protocol during bolus administration of intravenous contrast.  CONTRAST:  137mL ISOVUE-300 IOPAMIDOL (ISOVUE-300) INJECTION 61%  COMPARISON:  Barium esophagram 08/22/2019.  FINDINGS: CT CHEST FINDINGS  Cardiovascular: The heart size is normal. No substantial pericardial effusion. Coronary artery calcification is evident. Atherosclerotic calcification is noted in the wall of the thoracic aorta. Ascending thoracic aorta is 4 cm diameter.  Mediastinum/Nodes: No mediastinal lymphadenopathy. 6 mm short axis lower paraesophageal node is visible on image 48/series 2. Soft tissue mass noted distal esophagus, compatible with known neoplasm. No axillary lymphadenopathy. There is no hilar lymphadenopathy.  Lungs/Pleura: Centrilobular and paraseptal emphysema evident. 6 mm perifissural nodule noted right middle lobe on image 100/series 4, likely a subpleural lymph node. Tiny calcified granuloma noted left lower lobe on 140/4. A cluster of granulomata in scarring in the lateral left lower lobe is compatible with prior granulomatous involvement.  Musculoskeletal: No worrisome lytic or sclerotic osseous abnormality.  CT ABDOMEN PELVIS FINDINGS  Hepatobiliary: The liver shows diffusely decreased attenuation suggesting fat deposition. Liver measures 18.2 cm craniocaudal length, enlarged. No suspicious focal lesion within the hepatic parenchyma. Gallbladder is nondistended. No intrahepatic or extrahepatic biliary dilation.  Pancreas: No focal mass lesion. No dilatation of the main duct. No intraparenchymal cyst. No peripancreatic edema.  1.9 x 1.3 cm  nodular collection of soft tissues identified posterior to the  descending duodenum (see image 75/series 2. This is in close proximity to the pancreatic head but a definite communication of parenchyma between these 2 structures is not discernible by CT. 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  Spleen: No splenomegaly. No focal mass lesion.  Adrenals/Urinary Tract: No adrenal nodule or mass. 7 mm hypoattenuating lesion interpolar right kidney is too small to characterize but is most likely benign. Similar 4 mm cortical hypointensity in the lower pole left kidney cannot be characterized but is likely benign. No evidence for hydroureter. The urinary bladder appears normal for the degree of distention.  Stomach/Bowel: Stomach is unremarkable. No gastric wall thickening. No evidence of outlet obstruction. Duodenum is normally positioned as is the ligament of Treitz. No small bowel wall thickening. No small bowel dilatation. The terminal ileum is normal. The appendix is normal. Diverticular changes are noted in the left colon without evidence of diverticulitis.  Vascular/Lymphatic: There is abdominal aortic atherosclerosis without aneurysm. Left-sided IVC evident, normal variant. 15 mm short axis hepatoduodenal ligament lymph node is mildly enlarged (image 70/2. No other mediastinal lymphadenopathy although upper normal right para-aortic nodes are visible on images 73 and 75 of series 2, measuring up to 12 mm short axis. No pelvic sidewall lymphadenopathy.  Reproductive: The prostate gland and seminal vesicles are unremarkable.  Other: No intraperitoneal free fluid.  Musculoskeletal: Fatty lesion in the inferior aspect of the iliopsoas complex is compatible with lipoma. No worrisome lytic or sclerotic osseous abnormality.  IMPRESSION: 1. Soft tissue mass noted  distal esophagus, compatible with known neoplasm. There is a small 6 mm short axis adjacent paraesophageal node, concerning for metastatic disease. 2. Upper normal to mildly enlarged hepatoduodenal ligament lymph node, with upper normal right para-aortic nodes. As metastatic disease cannot be excluded, PET-CT may prove helpful to further evaluate. 3. 1.9 x 1.3 cm nodular collection of soft tissue posterior to the descending duodenum. This is in close proximity to the pancreatic head but a definite communication of parenchyma between these 2 structures is not discernible by CT. 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 Attention on follow-up recommended. 4. 6 mm perifissural nodule right middle lobe, likely a subpleural lymph node. Attention on follow-up recommended. 5. Hepatomegaly with hepatic steatosis. 6. Ascending thoracic aorta measures 4 cm diameter. Recommend annual imaging followup by CTA or MRA. This recommendation follows 2010 ACCF/AHA/AATS/ACR/ASA/SCA/SCAI/SIR/STS/SVM Guidelines for the Diagnosis and Management of Patients with Thoracic Aortic Disease. Circulation. 2010; 121: J242-A834. Aortic aneurysm NOS (ICD10-I71.9) 7. Left-sided IVC, normal variant.   Electronically Signed   By: Misty Stanley M.D.   On: 09/05/2019 10:38   NM PET Image Initial (PI) Skull Base To Thigh  Result Date: 09/20/2019 CLINICAL DATA:  Initial treatment strategy for malignant neoplasm of distal third of esophagus. COVID vaccine in right arm on 08/19/2019 EXAM: NUCLEAR MEDICINE PET SKULL BASE TO THIGH TECHNIQUE: 8.8 mCi F-18 FDG was injected intravenously. Full-ring PET imaging was performed from the skull base to thigh after the radiotracer. CT data was obtained and used for attenuation correction and anatomic localization. Fasting blood glucose: 134 mg/dl COMPARISON:  Esophagram of 08/22/2019. Chest abdomen and pelvic CTs of 09/05/2019. FINDINGS: Mediastinal blood pool  activity: SUV max 2.8 Liver activity: SUV max NA NECK: No areas of abnormal hypermetabolism. Incidental CT findings: Bilateral carotid atherosclerosis. No cervical adenopathy. Mucosal thickening of both maxillary sinuses. Fluid in both mastoid air cells. CHEST:  Low-level hypermetabolism within right axillary nodes, including index node of 1.2 cm and a S.U.V. max of 3.0, likely reactive and related to recent COVID-19 vaccine. The distal esophageal primary is markedly hypermetabolic. Example at a S.U.V. max of 25.8, including on 103/4. The adjacent periesophageal node described on CT is not hypermetabolic. No pulmonary parenchymal hypermetabolism. Incidental CT findings: Centrilobular and paraseptal emphysema. Aortic and coronary artery atherosclerosis. Borderline ascending aortic aneurysm, as on diagnostic CT. ABDOMEN/PELVIS: Multiple upper abdominal nodes demonstrate low-level, nonspecific hypermetabolism. An index retrocaval node measures 8 mm and a S.U.V. max of 2.7 on 125/4. A portal caval node measures 1.2 cm and a S.U.V. max of 3.3 on 128/4. Multifocal colonic hypermetabolism is primarily felt to be physiologic. There is focal anal hypermetabolism, slightly eccentric left, which measures a S.U.V. max of 11.9, including on 195/4. Incidental CT findings: Deferred to prior diagnostic CT. Hepatic steatosis and hepatomegaly. Left IVC. Aortic atherosclerosis. Extensive colonic diverticulosis. Lipoma within the right thigh musculature. SKELETON: No abnormal marrow activity. Incidental CT findings: none IMPRESSION: 1. Hypermetabolic distal esophageal primary. 2. Low-level hypermetabolism within upper abdominal nodes. These are technically nonspecific, but favored to be reactive in the setting of hepatic steatosis and hepatomegaly. 3. Otherwise, no evidence of metastatic disease. 4. Anal hypermetabolism could be physiologic. Consider correlation with physical exam and colonoscopy, if patient is not up-to-date. 5.  Incidental findings, including: Aortic atherosclerosis (ICD10-I70.0), coronary artery atherosclerosis and emphysema (ICD10-J43.9). Sinus disease and bilateral mastoid effusions. Electronically Signed   By: Abigail Miyamoto M.D.   On: 09/20/2019 09:26     I have independently reviewed the above radiology studies  and reviewed the findings with the patient.   Recent Lab Findings: Lab Results  Component Value Date   WBC 6.2 12/30/2019   HGB 11.2 (L) 12/30/2019   HCT 33.4 (L) 12/30/2019   PLT 176 12/30/2019   GLUCOSE 161 (H) 12/30/2019   CHOL 146 11/07/2018   TRIG 202 (H) 11/07/2018   HDL 35 (L) 11/07/2018   LDLCALC 71 11/07/2018   ALT 17 12/30/2019   AST 18 12/30/2019   NA 135 12/30/2019   K 4.2 12/30/2019   CL 103 12/30/2019   CREATININE 0.80 12/30/2019   BUN 11 12/30/2019   CO2 23 12/30/2019   INR 0.9 12/30/2019   HGBA1C 7.9 (A) 11/13/2019   ENDOSONOGRAPHIC FINDING: Findings: A hypoechoic mass was found in the lower third of the esophagus. The mass was encountered at 36 cm from the incisors and extended to 40 cm. The lesion was circumferential. The endosonographic borders were irregular. The mass measured up to 26 mm in thickness. There was sonographic evidence suggesting invasion into the adventitia (Layer 5). The large friable distal esophageal adenocarcinoma again identified. With gentle pressure and maneuvering, both the linear and radial echoendoscopes were able to pass through the area with very little resistance. The mass was circumfirential on EUS, but there was eccentric growth. The lesion extended beyond the muscularis propria and to the adventitia (images 12 and 13). At least three distinct peritumoral lymph nodes were found measuring 7-12 mm. No other supicious lesions were found in the upper GI tract. The heterotopic pancreatic tissue noted on the CT scan appeared to be continguous with the entire pancreas. There was lobularity in this area. - A mass was found in the  lower third of the esophagus. A tissue diagnosis was obtained prior to this exam. This is of adenocarcinoma. This was staged T3 N3 Mx by endosonographic criteria. - No specimens collected.  Assessment / Plan:    #1 locally advanced stage adenocarcinoma the distal esophagus-completed   weekly chemotherapy and radiation therapy-now with CT evidence of at least 50% reduction in size of the mass, and without evidence of distant metastatic disease.  Again reviewed with the patient and his wife the pros and cons of surgical resection.  He is agreeable to proceed with esophagectomy.   cardiology cardiac clearance-done  #2 history of long-term tobacco use-currently not smoking since October 2020 #3 oral agent controlled diabetes #4 incidental finding of mildly dilated ascending aorta 4 cm in diameter  I discussed with the patient and his wife in detail the pros and cons of surgical resection of this esophagus now that he has completed preoperative radiation chemotherapy.  He has tolerated this well CT scan shows evidence of response.  I had a detailed discussion with James Dunn and his wife  regarding the magnitude of the surgical esophagectomy procedure as well as the risks, the expected benefits, and alternatives. We discussed transhiatal approach.   I quoted James Dunn 5% perioperative mortality rate and a complication rate as high as 40%.  We specifically discussed complications, which include, but were not limited to: recurrent nerve injury with possible permanent hoarseness, anastomotic leak, airway and great vessel injury, conduit ischemia, thoracic duct leak, the inability to complete the operation via a transhiatal approach requiring a right thoracotomy,  bleeding, need for blood transfusion and the potential need for ventilator support.  James Dunn has had questions answered is well informed and willing to proceed.  [name] MD      [address]  [id number] Office [phone]     01/01/2020 7:28 AM

## 2020-01-01 NOTE — Brief Op Note (Signed)
      [address]       [place] [address]             [phone]    01/01/2020  4:56 PM  PATIENT:  James Dunn  69 y.o. male  PRE-OPERATIVE DIAGNOSIS:  ADENOCARCINOMA OF DISTAL ESOPHAGUS  POST-OPERATIVE DIAGNOSIS:  ADENOCARCINOMA OF DISTAL ESOPHAGUS  PROCEDURE:  Procedure(s): VIDEO BRONCHOSCOPY WITH BRONCHIAL WASHING FOR CULTURES AND GRAM STAIN (N/A) TRANSHIATAL TOTAL ESOPHAGECTOMY COMPLETE WITH NIMS TUBE (N/A) JEJUNOSTOMY (N/A) CHEST TUBE INSERTION (Left) PYLOROPLASTY  SURGEON:  Surgeon(s) and Role:    * [name], MD - Primary  PHYSICIAN ASSISTANT: M Roddenbury   ANESTHESIA:   general  EBL:  200 mL   BLOOD ADMINISTERED:none  DRAINS: left chest tube, left neck drain   LOCAL MEDICATIONS USED:  NONE  SPECIMEN:  Source of Specimen:  esophagus  DISPOSITION OF SPECIMEN:  PATHOLOGY  COUNTS:  YES   DICTATION: .Dragon Dictation  PLAN OF CARE: Admit to inpatient   PATIENT DISPOSITION:  ICU - intubated and hemodynamically stable.   Delay start of Pharmacological VTE agent (>24hrs) due to surgical blood loss or risk of bleeding: yes

## 2020-01-01 NOTE — Progress Notes (Signed)
      [address]       [place] [address]             [phone]      S/p transhiatal esophagectomy  BP (!) 141/82   Pulse (!) 57   Temp 98.5 F (36.9 C) (Oral)   Resp 12   Ht 5\' 7"  (1.702 m)   Wt 72.6 kg   SpO2 100%   BMI 25.06 kg/m   On neo at 20 mcg/min IMV 12 50%, 5 PEEP  Intake/Output Summary (Last 24 hours) at 01/01/2020 1919 Last data filed at 01/01/2020 1900 Gross per 24 hour  Intake 3543.54 ml  Output 600 ml  Net 2943.54 ml   K= 4.0, Hct= 36  Doing well early postop  Remo Lipps C. Roxan Hockey, MD [facility] [phone]

## 2020-01-01 NOTE — Transfer of Care (Signed)
Immediate Anesthesia Transfer of Care Note  Patient: James Dunn  Procedure(s) Performed: VIDEO BRONCHOSCOPY WITH BRONCHIAL WASHING FOR CULTURES AND GRAM STAIN (N/A ) TRANSHIATAL TOTAL ESOPHAGECTOMY COMPLETE WITH NIMS TUBE (N/A Abdomen) JEJUNOSTOMY (N/A ) CHEST TUBE INSERTION (Left )  Patient Location: ICU  Anesthesia Type:General  Level of Consciousness: sedated and Patient remains intubated per anesthesia plan  Airway & Oxygen Therapy: Patient remains intubated per anesthesia plan and Patient placed on Ventilator (see vital sign flow sheet for setting)  Post-op Assessment: Report given to RN and Post -op Vital signs reviewed and stable  Post vital signs: Reviewed and stable  Last Vitals:  Vitals Value Taken Time  BP 129/83 01/01/20 1628  Temp    Pulse 56 01/01/20 1630  Resp 14 01/01/20 1630  SpO2 100 % 01/01/20 1630  Vitals shown include unvalidated device data.  Last Pain:  Vitals:   01/01/20 0707  TempSrc:   PainSc: 0-No pain      Patients Stated Pain Goal: 3 (13/88/71 9597)  Complications: No complications documented.

## 2020-01-01 NOTE — Anesthesia Procedure Notes (Signed)
Central Venous Catheter Insertion Performed by: Effie Berkshire, MD, anesthesiologist Start/End7/14/2021 8:05 AM, 01/01/2020 8:15 AM Patient location: Pre-op. Preanesthetic checklist: patient identified, IV checked, site marked, risks and benefits discussed, surgical consent, monitors and equipment checked, pre-op evaluation, timeout performed and anesthesia consent Position: Trendelenburg Lidocaine 1% used for infiltration and patient sedated Hand hygiene performed , maximum sterile barriers used  and Seldinger technique used Catheter size: 8 Fr Total catheter length 16. Central line was placed.Double lumen Procedure performed using ultrasound guided technique. Ultrasound Notes:anatomy identified, needle tip was noted to be adjacent to the nerve/plexus identified, no ultrasound evidence of intravascular and/or intraneural injection and image(s) printed for medical record Attempts: 1 Following insertion, dressing applied, line sutured and Biopatch. Post procedure assessment: blood return through all ports  Patient tolerated the procedure well with no immediate complications.

## 2020-01-01 NOTE — Anesthesia Procedure Notes (Signed)
Procedure Name: Intubation Date/Time: 01/01/2020 8:37 AM Performed by: Wilburn Cornelia, CRNA Pre-anesthesia Checklist: Patient identified, Emergency Drugs available, Suction available and Patient being monitored Patient Re-evaluated:Patient Re-evaluated prior to induction Oxygen Delivery Method: Circle System Utilized Preoxygenation: Pre-oxygenation with 100% oxygen Induction Type: IV induction Ventilation: Mask ventilation without difficulty Laryngoscope Size: Glidescope and 3 Grade View: Grade I Tube type: Oral (NIM tube) Tube size: 8.0 mm Number of attempts: 1 Airway Equipment and Method: Stylet Placement Confirmation: ETT inserted through vocal cords under direct vision,  positive ETCO2 and breath sounds checked- equal and bilateral Secured at: 23 cm Tube secured with: Tape Dental Injury: Teeth and Oropharynx as per pre-operative assessment

## 2020-01-01 NOTE — Procedures (Signed)
Extubation Procedure Note  Patient Details:   Name: James Dunn DOB: 12-Mar-1951 MRN: [id number]   Airway Documentation:  Airway 8 mm (Active)  Secured at (cm) 23 cm 01/01/20 1630  Measured From Lips 01/01/20 2005  Secured Location Right 01/01/20 2005  Secured By Pink Tape 01/01/20 2005  Site Condition Dry 01/01/20 1630   Vent end date: 01/01/20 Vent end time: 2044   Evaluation  O2 sats: stable throughout and transiently fell during during procedure Complications: No apparent complications Patient did tolerate procedure well. Bilateral Breath Sounds: Clear, Diminished   Yes   NIF -35 VC 1.5L placed pt on 4L [place] No stridor heard in upper airway.  Angelique Holm 01/01/2020, 8:52 PM

## 2020-01-01 NOTE — Anesthesia Preprocedure Evaluation (Addendum)
Anesthesia Evaluation  Patient identified by MRN, date of birth, ID band Patient awake    Reviewed: Allergy & Precautions, NPO status , Patient's Chart, lab work & pertinent test results  Airway Mallampati: III  TM Distance: >3 FB Neck ROM: Full    Dental  (+) Teeth Intact, Dental Advisory Given   Pulmonary asthma , former smoker,    breath sounds clear to auscultation       Cardiovascular hypertension, + CAD   Rhythm:Regular Rate:Normal     Neuro/Psych negative neurological ROS  negative psych ROS   GI/Hepatic Neg liver ROS, GERD  ,  Endo/Other  diabetes, Type 2  Renal/GU negative Renal ROS     Musculoskeletal negative musculoskeletal ROS (+)   Abdominal Normal abdominal exam  (+)   Peds  Hematology negative hematology ROS (+)   Anesthesia Other Findings   Reproductive/Obstetrics                            Anesthesia Physical Anesthesia Plan  ASA: III  Anesthesia Plan: General   Post-op Pain Management:    Induction: Intravenous  PONV Risk Score and Plan: 3 and Ondansetron, Dexamethasone and Midazolam  Airway Management Planned: Oral ETT  Additional Equipment: Arterial line, CVP and Ultrasound Guidance Line Placement  Intra-op Plan:   Post-operative Plan: Post-operative intubation/ventilation  Informed Consent: I have reviewed the patients History and Physical, chart, labs and discussed the procedure including the risks, benefits and alternatives for the proposed anesthesia with the patient or authorized representative who has indicated his/her understanding and acceptance.     Dental advisory given  Plan Discussed with: CRNA  Anesthesia Plan Comments: (Lab Results      Component                Value               Date                      WBC                      6.2                 12/30/2019                HGB                      11.2 (L)            12/30/2019                 HCT                      33.4 (L)            12/30/2019                MCV                      91.8                12/30/2019                PLT                      176  12/30/2019            Lab Results      Component                Value               Date                      CREATININE               0.80                12/30/2019                BUN                      11                  12/30/2019                NA                       135                 12/30/2019                K                        4.2                 12/30/2019                CL                       103                 12/30/2019                CO2                      23                  12/30/2019           )       Anesthesia Quick Evaluation

## 2020-01-01 NOTE — Telephone Encounter (Signed)
  Radiation Oncology         [phone] ________________________________  Name: James Dunn MRN: [id number]  Date of Service: .01/06/20  DOB: 09-06-1950  Post Treatment Telephone Note  Diagnosis: Stage IV, cT3N2M0 adenocarcinoma of the distal esophagus.  Interval Since Last Radiation:  8 weeks   09/30/19 - 11/06/19                    The patient was treated to the esophagus to a dose of 45 Gy in 25 fractions using a IMRT technique. He then received a 5.4 Gy boost, also using IMRT. The total dose was 50.4 Gy.  Narrative:  The patient was contacted today for routine follow-up. 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 He met with Dr. Servando Snare this week and is planning to proceed with esophagectomy.  Impression/Plan: 1. Stage IV, cT3N2M0 adenocarcinoma of the distal esophagus. The patient has been doing well since completion of radiotherapy. I had to leave him a voicemail but on it I discussed that we would be happy to continue to follow him as needed, but he will also continue to follow up with Dr. [name] in medical oncology and with Dr. Servando Snare as he is planning to proceed with esophagectomy.     Carola Rhine, PAC

## 2020-01-02 ENCOUNTER — Encounter (HOSPITAL_COMMUNITY): Payer: Self-pay | Admitting: Cardiothoracic Surgery

## 2020-01-02 ENCOUNTER — Inpatient Hospital Stay (HOSPITAL_COMMUNITY): Payer: PPO

## 2020-01-02 LAB — CBC
HCT: 33.8 % — ABNORMAL LOW (ref 39.0–52.0)
Hemoglobin: 11.1 g/dL — ABNORMAL LOW (ref 13.0–17.0)
MCH: 30.4 pg (ref 26.0–34.0)
MCHC: 32.8 g/dL (ref 30.0–36.0)
MCV: 92.6 fL (ref 80.0–100.0)
Platelets: 167 10*3/uL (ref 150–400)
RBC: 3.65 MIL/uL — ABNORMAL LOW (ref 4.22–5.81)
RDW: 14.6 % (ref 11.5–15.5)
WBC: 11.1 10*3/uL — ABNORMAL HIGH (ref 4.0–10.5)
nRBC: 0 % (ref 0.0–0.2)

## 2020-01-02 LAB — GLUCOSE, CAPILLARY
Glucose-Capillary: 115 mg/dL — ABNORMAL HIGH (ref 70–99)
Glucose-Capillary: 122 mg/dL — ABNORMAL HIGH (ref 70–99)
Glucose-Capillary: 131 mg/dL — ABNORMAL HIGH (ref 70–99)
Glucose-Capillary: 132 mg/dL — ABNORMAL HIGH (ref 70–99)
Glucose-Capillary: 135 mg/dL — ABNORMAL HIGH (ref 70–99)
Glucose-Capillary: 138 mg/dL — ABNORMAL HIGH (ref 70–99)
Glucose-Capillary: 152 mg/dL — ABNORMAL HIGH (ref 70–99)

## 2020-01-02 LAB — BASIC METABOLIC PANEL
Anion gap: 10 (ref 5–15)
BUN: 14 mg/dL (ref 8–23)
CO2: 22 mmol/L (ref 22–32)
Calcium: 8.1 mg/dL — ABNORMAL LOW (ref 8.9–10.3)
Chloride: 103 mmol/L (ref 98–111)
Creatinine, Ser: 1.11 mg/dL (ref 0.61–1.24)
GFR calc Af Amer: >60 mL/min (ref >60)
GFR calc non Af Amer: >60 mL/min (ref >60)
Glucose, Bld: 136 mg/dL — ABNORMAL HIGH (ref 70–99)
Potassium: 4.5 mmol/L (ref 3.5–5.1)
Sodium: 135 mmol/L (ref 135–145)

## 2020-01-02 LAB — POCT I-STAT 7, (LYTES, BLD GAS, ICA,H+H)
Acid-base deficit: 3 mmol/L — ABNORMAL HIGH (ref 0.0–2.0)
Bicarbonate: 22.3 mmol/L (ref 20.0–28.0)
Calcium, Ion: 1.07 mmol/L — ABNORMAL LOW (ref 1.15–1.40)
HCT: 33 % — ABNORMAL LOW (ref 39.0–52.0)
Hemoglobin: 11.2 g/dL — ABNORMAL LOW (ref 13.0–17.0)
O2 Saturation: 98 %
Patient temperature: 36.1
Potassium: 4.5 mmol/L (ref 3.5–5.1)
Sodium: 138 mmol/L (ref 135–145)
TCO2: 23 mmol/L (ref 22–32)
pCO2 arterial: 37.3 mmHg (ref 32.0–48.0)
pH, Arterial: 7.38 (ref 7.350–7.450)
pO2, Arterial: 94 mmHg (ref 83.0–108.0)

## 2020-01-02 MED ORDER — ENOXAPARIN SODIUM 30 MG/0.3ML ~~LOC~~ SOLN
30.0000 mg | SUBCUTANEOUS | Status: DC
  Administered 2020-01-02 – 2020-01-13 (×12): 30 mg via SUBCUTANEOUS
  Filled 2020-01-02 (×12): qty 0.3

## 2020-01-02 NOTE — Op Note (Signed)
NAME: James Dunn, NOFFKE MEDICAL RECORD KD:[id number] ACCOUNT [id number] DATE OF BIRTH:09-Jun-1951 FACILITY: [facility] LOCATION: [facility] PHYSICIAN:Seena Ritacco Maryruth Bun, MD  OPERATIVE REPORT  DATE OF PROCEDURE:  01/01/2020  PREOPERATIVE DIAGNOSIS:  Adenocarcinoma of distal esophagus and gastroesophageal junction.  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  SURGICAL PROCEDURE:   1.  Bronchoscopy.   2.  Transhiatal total esophagectomy with cervical esophagogastrostomy.   3.  Pyloroplasty.   4.  Feeding jejunostomy.   5.  Placement of left chest tube.  SURGEON:  Lanelle Bal, MD  FIRST ASSISTANT:  Enid Cutter PA.  BRIEF HISTORY:  The patient is a 69 year old male who presented with a 10-pound weight loss and difficulty swallowing.  Biopsy showed adenocarcinoma.  A transesophageal ultrasound noted a T3 lesion with possibly 3 peritumor lymph nodes.  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  PET scan showed mild upper abdominal metabolic activity in the lymph nodes, thought to be more inflammatory in nature than metastatic.  With the T3 lesion in place, it was recommended to the  patient to proceed with radiation, chemotherapy and then consider surgery.  The patient has been a long-term smoker, quitting in October 2020.  Cardiac clearance was obtained.  A negative perfusion study was done.  Risks and options of surgery were  discussed with the patient in detail and he was agreeable and signed informed consent.  DESCRIPTION OF PROCEDURE:  Patient with arterial line and central line in place, underwent general endotracheal anesthesia without incident.  A NIMS tube was placed.  Appropriate leads for recurrent nerve monitoring were placed.  The left neck, chest and  abdomen were prepped with Betadine, draped in a sterile manner.  Appropriate timeout was performed.  We proceeded first making an upper abdominal midline  incision and placed in a wound protector and retractors.  This gave good exposure of the upper  abdomen.  The liver felt palpably normal.  There were no significant enlarged nodes noted in the upper abdomen on first examination.  We then proceeded with dissecting out the esophageal hiatus and encircling the esophagus with a Penrose drain.  There  was firmness in distal esophagus, but no overt extension of tumor into the crura or surrounding tissue.  We then used a Lig X  cautery unit, dividing short gastric vessels to free the fundus of the stomach.  We then entered the lesser sac and dissection  of the coronary vein and left gastric artery were obtained.  Vessel loops were placed around the vessels.  We dissected along the lesser curve of the stomach.  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  This did not appear to be tumor and was not biopsied.  With the bulldog on the left gastric artery, the stomach appeared viable and had an easily palpable right epiploic pulse.  We then proceeded  with the transhiatal portion of the resection, enlarging the hiatus and using Harmonic scalpel as far proximally as we could see with the dissection at least to the trachea.  We then turned our attention to the left neck.  The left neck incision was made  along the anterior border of the sternocleidomastoid muscle.  Dissection was carried down to the precervical fascia, retracting the jugular vein and carotid artery laterally.  Along the tracheoesophageal groove, we were able to identify the recurrent  nerve and  confirm its location with the NIMS stimulator.  We then encircled the cervical esophagus with a Penrose drain.  Then, working from above and below, completed freeing the esophagus and the upper mediastinum.  The NG tube was then retracted to  the cervical esophagus.  Using a GIA stapler the cervical esophagus was  divided.  The specimen was then delivered to the abdomen.  We then marked approximately 4 cm margin along the lesser curve and with serial firing of 55 GIA stapler, the gastric tube  was created.  The left gastric artery and coronary vein were divided with a vascular stapler, freeing the gastric tube to position in the posterior mediastinum.  The specimen was submitted to pathology labeled as gastric margin, gastric margin fundus,  gastric margin lesser curve and proximal esophageal margin.  Frozen section showed clear margins.  We then used a telescope sterile bag and positioned the gastric tube and the telescope bag attached to a Foley catheter and transferred the esophagus to  the cervical incision.  The cervical esophagus and the fundus of the stomach away from the tip which had previously been marked were then tacked together with interrupted 2-0 silk pop-offs.  We then made a small opening in the esophagus and in the  stomach.  The anastomosis was created using a 30 mm GIA Endo stapler with 1 limb in the esophagus and 1 limb in the stomach, creating the back wall of the anastomosis.  We then passed the NG tube into the gastric tube and the anterior wall was closed in  2 layers with interrupted 4-0 Vicryl sutures and then followed by interrupted 4-0 silk sutures.  The esophagus and the stomach appeared viable and were repositioned into the posterior neck.  A Penrose drain was placed in the vicinity of the anastomosis  and brought out through a separate site in the left neck.  When we completed this, the patient at the time was not paralyzed and we confirmed the intactness of the recurrent nerve with the nerve stimulator.  Prior to placing the stomach into the posterior mediastinum, a pyloroplasty was done.  We initially started with a pyloromyotomy, but entered into the stomach, so a formal pyloroplasty was completed and closed with interrupted Vicryl sutures and  interrupted 4-0 silk sutures.  With  the stomach in place, the esophageal hiatus was then closed.  We had entered the left pleural space, so a left chest tube was placed.  We then confirmed the position of the ligament of Treitz and in the proximal  jejunum, placed an 18-French red rubber catheter whitzeled in place with interrupted 4-0 silk sutures to use as a jejunostomy tube.  The tube was brought out through the left abdominal wall.  The entrance site was tacked to the abdominal wall and distal  to this, the bowel was tacked to the abdominal wall to prevent torsion.  Then with the operative field hemostatic, the abdominal incision was closed with #2 Prolene sutures, running 2-0 Vicryl and a 3-0 subcuticular stitch.  The left neck incision was  closed, reapproximating the platysma and anterior border of the sternocleidomastoid muscle with interrupted 3-0 Vicryl sutures, a running 4-0 Vicryl subcuticular stitch was placed.  At the completion of the procedure, sponge and needle count was reported  as correct.  The patient tolerated the procedure without obvious complication.  He was then transferred to the surgical intensive care unit in stable condition, intubated, to be weaned in the early postoperative period.  VN/NUANCE  D:01/02/2020 T:01/02/2020 JOB:[id number]

## 2020-01-02 NOTE — Plan of Care (Signed)
  Problem: Education: Goal: Knowledge of General Education information will improve Description: Including pain rating scale, medication(s)/side effects and non-pharmacologic comfort measures Outcome: Progressing   Problem: Health Behavior/Discharge Planning: Goal: Ability to manage health-related needs will improve Outcome: Progressing   Problem: Clinical Measurements: Goal: Ability to maintain clinical measurements within normal limits will improve Outcome: Progressing Goal: Will remain free from infection Outcome: Progressing Goal: Diagnostic test results will improve Outcome: Progressing Goal: Respiratory complications will improve Outcome: Progressing Goal: Cardiovascular complication will be avoided Outcome: Progressing   Problem: Activity: Goal: Risk for activity intolerance will decrease Outcome: Progressing   Problem: Nutrition: Goal: Adequate nutrition will be maintained Outcome: Progressing   Problem: Coping: Goal: Level of anxiety will decrease Outcome: Progressing   Problem: Elimination: Goal: Will not experience complications related to bowel motility Outcome: Progressing Goal: Will not experience complications related to urinary retention Outcome: Progressing   Problem: Pain Managment: Goal: General experience of comfort will improve Outcome: Progressing   Problem: Safety: Goal: Ability to remain free from injury will improve Outcome: Progressing   Problem: Skin Integrity: Goal: Risk for impaired skin integrity will decrease Outcome: Progressing   Problem: Education: Goal: Knowledge of the prescribed therapeutic regimen will improve Outcome: Progressing   Problem: Bowel/Gastric: Goal: Gastrointestinal status for postoperative course will improve Outcome: Progressing   Problem: Nutritional: Goal: Ability to achieve adequate nutritional intake will improve Outcome: Progressing   Problem: Clinical Measurements: Goal: Postoperative  complications will be avoided or minimized Outcome: Progressing   Problem: Respiratory: Goal: Ability to maintain a clear airway will improve Outcome: Progressing   Problem: Skin Integrity: Goal: Demonstration of wound healing without infection will improve Outcome: Progressing   

## 2020-01-02 NOTE — Progress Notes (Signed)
CTS  Breathing comfortably after esophagectomy Min chest tube output  Blood pressure 116/61, pulse 94, temperature 98.7 F (37.1 C), temperature source Oral, resp. rate 20, height 5\' 7"  (1.702 m), weight 73.1 kg, SpO2 96 %.

## 2020-01-02 NOTE — Progress Notes (Signed)
Initial Nutrition Assessment  DOCUMENTATION CODES:   Non-severe (moderate) malnutrition in context of chronic illness  INTERVENTION:   Tube Feeding via J-tube: Osmolite 1.5 at 65 ml/hr Recommended titration: Begin at 20 ml/hr; titrate by 10 mL q 8 hours until goal rate of 65 ml/hr ProSource TF 45 mL BID Provides 2420 kcals, 120 g of protein, 1186 mL of free water Meets 100% estimated calorie and protein needs  NUTRITION DIAGNOSIS:   Moderate Malnutrition related to chronic illness as evidenced by mild fat depletion, mild muscle depletion.  GOAL:   Patient will meet greater than or equal to 90% of their needs  MONITOR:   TF tolerance, Diet advancement, Labs, Weight trends, Skin  REASON FOR ASSESSMENT:   Rounds, Diagnosis    ASSESSMENT:   69 yo male with adenocarcinoma of distal esophagus admitted for esophagectomy. Pt started on weekly chemo and daily radiation therapy on 10/02/19. Last day of chemo was 5/17, last radiation therapy was 5/19. PMH includes long term tobacco use (stoped 03/2019), DM  7/14 Transhiatal total esophagectomy, pyloroplasty, J-tube, Chest tube  Pt did not feel well on visit today; pt complaining of lots of pain. NG in place for decompression  Appetite fairly good recently per wife. Pt eating better with no pain when swallowing, food not getting hung up. Taking jello and milkshakes but does not sound like pt was eating much in the way of solid foods. Pt also taking some Ensure or Glucerna shakes at home but po intake was variable. Pt stating that he did not have a "goal for the day."  Noted pt seen by Outpatient Oncology RD in May 2021 and pt with significant pain related to swallowing which affected his po intake. In May, noted pt reporting increased pain making it difficult to swallow water and eating poorly  Reported 10 pound weight loss in 2 months in MD notes Current weight 73.1 kg. Pt reports he began losing weight prior to his cancer dx and  initiation of chemo/radiation due to difficulty swallowing. Per outpatient records, pt weighed 184 pounds in Jan of 2021. 12-13% weight loss. Weight recently stable, while no weight loss present, pt has not regained any weight either.   Pt reports he has felt strong lately, no problems walking or perfoming ADLs    Labs: reviewed Meds: ss novolog, colace, miralax  NUTRITION - FOCUSED PHYSICAL EXAM:    Most Recent Value  Orbital Region Mild depletion  Upper Arm Region Mild depletion  Thoracic and Lumbar Region Mild depletion  Buccal Region Mild depletion  Temple Region Mild depletion  Clavicle Bone Region Mild depletion  Clavicle and Acromion Bone Region Mild depletion  Scapular Bone Region Mild depletion  Dorsal Hand No depletion  Patellar Region Mild depletion  Anterior Thigh Region Mild depletion  Posterior Calf Region Mild depletion  Edema (RD Assessment) None       Diet Order:   Diet Order    None      EDUCATION NEEDS:   Education needs have been addressed  Skin:  Skin Assessment: Reviewed RN Assessment  Last BM:  PTA  Height:   Ht Readings from Last 1 Encounters:  01/01/20 5\' 7"  (1.702 m)    Weight:   Wt Readings from Last 1 Encounters:  01/02/20 73.1 kg    BMI:  Body mass index is 25.24 kg/m.  Estimated Nutritional Needs:   Kcal:  2200-2500 kcals  Protein:  110-130 g  Fluid:  >/= 2 L    Kerman Passey  MS, RDN, LDN, CNSC Registered Dietitian III Clinical Nutrition RD Pager and On-Call Pager Number Located in [place]

## 2020-01-02 NOTE — Progress Notes (Signed)
Patient ID: James Dunn, male   DOB: 11-24-1950, 69 y.o.   MRN: [id number] TCTS DAILY ICU PROGRESS NOTE                   [address]            [place] [address]          [phone]   1 Day Post-Op Procedure(s) (LRB): VIDEO BRONCHOSCOPY WITH BRONCHIAL WASHING FOR CULTURES AND GRAM STAIN (N/A) TRANSHIATAL TOTAL ESOPHAGECTOMY COMPLETE WITH NIMS TUBE (N/A) JEJUNOSTOMY (N/A) CHEST TUBE INSERTION (Left)  Total Length of Stay:  LOS: 1 day   Subjective: Patient awake alert extubated last night sitting in the chair this morning.  Objective: Vital signs in last 24 hours: Temp:  [97 F (36.1 C)-98.7 F (37.1 C)] 97 F (36.1 C) (07/15 0006) Pulse Rate:  [55-83] 73 (07/15 0700) Cardiac Rhythm: Normal sinus rhythm (07/15 0400) Resp:  [12-55] 20 (07/15 0700) BP: (97-155)/(50-100) 105/69 (07/15 0700) SpO2:  [94 %-100 %] 99 % (07/15 0700) Arterial Line BP: (90-151)/(41-80) 93/44 (07/15 0700) FiO2 (%):  [40 %-50 %] 40 % (07/14 2005) Weight:  [73.1 kg] 73.1 kg (07/15 0500)  Filed Weights   01/01/20 0645 01/02/20 0500  Weight: 72.6 kg 73.1 kg    Weight change: 0.524 kg   Hemodynamic parameters for last 24 hours:    Intake/Output from previous day: 07/14 0701 - 07/15 0700 In: 5310.9 [I.V.:4810.8; IV Piggyback:500.1] Out: 1415 [Urine:1015; Emesis/NG output:5; Drains:60; Blood:200; Chest Tube:135]  Intake/Output this shift: No intake/output data recorded.  Current Meds: Scheduled Meds: . Chlorhexidine Gluconate Cloth  6 each Topical Daily  . docusate  100 mg Oral BID  . enoxaparin (LOVENOX) injection  30 mg Subcutaneous Q24H  . HYDROmorphone   Intravenous Q4H  . insulin aspart  0-24 Units Subcutaneous Q4H  . pantoprazole (PROTONIX) IV  40 mg Intravenous Q12H  . polyethylene glycol  17 g Oral Daily   Continuous Infusions: . sodium chloride 100 mL/hr at 01/02/20 0700  . dexmedetomidine (PRECEDEX) IV infusion Stopped (01/01/20 2113)  . phenylephrine  (NEO-SYNEPHRINE) Adult infusion 70 mcg/min (01/02/20 0700)   PRN Meds:.albuterol, diphenhydrAMINE **OR** diphenhydrAMINE, HYDROmorphone (DILAUDID) injection, HYDROmorphone (DILAUDID) injection, morphine injection, naloxone **AND** sodium chloride flush, ondansetron (ZOFRAN) IV  General appearance: alert, cooperative and no distress Neurologic: intact Heart: regular rate and rhythm, S1, S2 normal, no murmur, click, rub or gallop Lungs: diminished breath sounds bibasilar Abdomen: soft, non-tender; bowel sounds normal; no masses,  no organomegaly Extremities: extremities normal, atraumatic, no cyanosis or edema Wound: drain intact neck   Lab Results: CBC: Recent Labs    01/01/20 1644 01/01/20 2038 01/02/20 0408 01/02/20 0618  WBC 9.9  --  11.1*  --   HGB 11.9*   < > 11.1* 11.2*  HCT 36.3*   < > 33.8* 33.0*  PLT 221  --  167  --    < > = values in this interval not displayed.   BMET:  Recent Labs    01/01/20 1644 01/01/20 2038 01/02/20 0408 01/02/20 0618  NA 138   < > 135 138  K 4.0   < > 4.5 4.5  CL 105  --  103  --   CO2 22  --  22  --   GLUCOSE 106*  --  136*  --   BUN 10  --  14  --   CREATININE 1.23  --  1.11  --   CALCIUM 8.5*  --  8.1*  --    < > = values in this interval not displayed.    CMET: Lab Results  Component Value Date   WBC 11.1 (H) 01/02/2020   HGB 11.2 (L) 01/02/2020   HCT 33.0 (L) 01/02/2020   PLT 167 01/02/2020   GLUCOSE 136 (H) 01/02/2020   CHOL 146 11/07/2018   TRIG 202 (H) 11/07/2018   HDL 35 (L) 11/07/2018   LDLCALC 71 11/07/2018   ALT 17 12/30/2019   AST 18 12/30/2019   NA 138 01/02/2020   K 4.5 01/02/2020   CL 103 01/02/2020   CREATININE 1.11 01/02/2020   BUN 14 01/02/2020   CO2 22 01/02/2020   INR 0.9 12/30/2019   HGBA1C 7.9 (A) 11/13/2019   MICROALBUR 6.3 07/08/2019      PT/INR:  Recent Labs    12/30/19 1044  LABPROT 11.5  INR 0.9   Radiology: [facility] Chest Port 1 View  Result Date: 01/01/2020 CLINICAL DATA:  History  of esophagectomy EXAM: PORTABLE CHEST 1 VIEW COMPARISON:  December 30, 2019 FINDINGS: The heart size and mediastinal contours are within normal limits. Aortic knob calcifications. Both lungs are clear. A left-sided chest tube is seen. NG tube is seen within stomach. Endotracheal tube is seen 3 cm above the level of the carina. A central venous catheter seen with the tip in mid SVC. IMPRESSION: Lines and tubes in satisfactory position. No acute cardiopulmonary process Electronically Signed   By: Prudencio Pair M.D.   On: 01/01/2020 17:05     Assessment/Plan: S/P Procedure(s) (LRB): VIDEO BRONCHOSCOPY WITH BRONCHIAL WASHING FOR CULTURES AND GRAM STAIN (N/A) TRANSHIATAL TOTAL ESOPHAGECTOMY COMPLETE WITH NIMS TUBE (N/A) JEJUNOSTOMY (N/A) CHEST TUBE INSERTION (Left) Mobilize Diabetes control See progression orders Renal function stable Voice clear - good singing e     [name] 01/02/2020 8:05 AM

## 2020-01-03 ENCOUNTER — Inpatient Hospital Stay (HOSPITAL_COMMUNITY): Payer: PPO

## 2020-01-03 DIAGNOSIS — E440 Moderate protein-calorie malnutrition: Secondary | ICD-10-CM | POA: Insufficient documentation

## 2020-01-03 LAB — CULTURE, RESPIRATORY W GRAM STAIN
Culture: NO GROWTH
Gram Stain: NONE SEEN

## 2020-01-03 LAB — CBC
HCT: 30.5 % — ABNORMAL LOW (ref 39.0–52.0)
Hemoglobin: 9.4 g/dL — ABNORMAL LOW (ref 13.0–17.0)
MCH: 29.7 pg (ref 26.0–34.0)
MCHC: 30.8 g/dL (ref 30.0–36.0)
MCV: 96.5 fL (ref 80.0–100.0)
Platelets: 132 10*3/uL — ABNORMAL LOW (ref 150–400)
RBC: 3.16 MIL/uL — ABNORMAL LOW (ref 4.22–5.81)
RDW: 15 % (ref 11.5–15.5)
WBC: 9 10*3/uL (ref 4.0–10.5)
nRBC: 0 % (ref 0.0–0.2)

## 2020-01-03 LAB — BASIC METABOLIC PANEL
Anion gap: 8 (ref 5–15)
BUN: 15 mg/dL (ref 8–23)
CO2: 25 mmol/L (ref 22–32)
Calcium: 8 mg/dL — ABNORMAL LOW (ref 8.9–10.3)
Chloride: 104 mmol/L (ref 98–111)
Creatinine, Ser: 1.12 mg/dL (ref 0.61–1.24)
GFR calc Af Amer: >60 mL/min (ref >60)
GFR calc non Af Amer: >60 mL/min (ref >60)
Glucose, Bld: 152 mg/dL — ABNORMAL HIGH (ref 70–99)
Potassium: 4.6 mmol/L (ref 3.5–5.1)
Sodium: 137 mmol/L (ref 135–145)

## 2020-01-03 LAB — GLUCOSE, CAPILLARY
Glucose-Capillary: 144 mg/dL — ABNORMAL HIGH (ref 70–99)
Glucose-Capillary: 136 mg/dL — ABNORMAL HIGH (ref 70–99)
Glucose-Capillary: 133 mg/dL — ABNORMAL HIGH (ref 70–99)
Glucose-Capillary: 149 mg/dL — ABNORMAL HIGH (ref 70–99)
Glucose-Capillary: 153 mg/dL — ABNORMAL HIGH (ref 70–99)
Glucose-Capillary: 162 mg/dL — ABNORMAL HIGH (ref 70–99)

## 2020-01-03 MED ORDER — PROSOURCE PLUS PO LIQD
30.0000 mL | Freq: Two times a day (BID) | ORAL | Status: DC
  Filled 2020-01-03: qty 30

## 2020-01-03 MED ORDER — PROSOURCE TF PO LIQD
45.0000 mL | Freq: Two times a day (BID) | ORAL | Status: DC
  Administered 2020-01-03 – 2020-01-12 (×19): 45 mL
  Filled 2020-01-03 (×20): qty 45

## 2020-01-03 MED ORDER — OSMOLITE 1.5 CAL PO LIQD
1000.0000 mL | ORAL | Status: DC
  Administered 2020-01-03: 1000 mL
  Filled 2020-01-03 (×2): qty 1000

## 2020-01-03 MED ORDER — PROSOURCE NO CARB PO LIQD
45.0000 mL | Freq: Two times a day (BID) | ORAL | Status: DC
  Administered 2020-01-03: 45 mL
  Filled 2020-01-03 (×2): qty 60

## 2020-01-03 MED ORDER — SODIUM CHLORIDE 0.9% FLUSH: 10.0000 mL | INTRAVENOUS | Status: DC | PRN

## 2020-01-03 MED ORDER — WHITE PETROLATUM EX OINT
TOPICAL_OINTMENT | CUTANEOUS | Status: DC | PRN
  Administered 2020-01-03: 0.2 via TOPICAL
  Filled 2020-01-03: qty 28.35

## 2020-01-03 MED ORDER — SODIUM CHLORIDE 0.9% FLUSH
10.0000 mL | Freq: Two times a day (BID) | INTRAVENOUS | Status: DC
  Administered 2020-01-03 – 2020-01-04 (×2): 10 mL
  Administered 2020-01-04: 40 mL
  Administered 2020-01-05: 10 mL
  Administered 2020-01-05: 30 mL
  Administered 2020-01-06: 10 mL
  Administered 2020-01-06: 30 mL
  Administered 2020-01-07 – 2020-01-12 (×11): 10 mL

## 2020-01-03 NOTE — Progress Notes (Addendum)
TCTS DAILY ICU PROGRESS NOTE                   [address]            [place] [address]          [phone]   2 Days Post-Op Procedure(s) (LRB): VIDEO BRONCHOSCOPY WITH BRONCHIAL WASHING FOR CULTURES AND GRAM STAIN (N/A) TRANSHIATAL TOTAL ESOPHAGECTOMY COMPLETE WITH NIMS TUBE (N/A) JEJUNOSTOMY (N/A) CHEST TUBE INSERTION (Left)  Total Length of Stay:  LOS: 2 days   Subjective: Awake and alert, says pain control is adequate. He is trying to use the PCA less.   Objective: Vital signs in last 24 hours: Temp:  [98.1 F (36.7 C)-98.8 F (37.1 C)] 98.7 F (37.1 C) (07/16 0741) Pulse Rate:  [78-107] 107 (07/16 0600) Cardiac Rhythm: Normal sinus rhythm (07/16 0400) Resp:  [14-32] 28 (07/16 0600) BP: (89-128)/(55-74) 112/57 (07/16 0500) SpO2:  [91 %-100 %] 91 % (07/16 0600) Arterial Line BP: (98)/(43) 98/43 (07/15 0930) Weight:  [73.4 kg] 73.4 kg (07/16 0610)  Filed Weights   01/01/20 0645 01/02/20 0500 01/03/20 0610  Weight: 72.6 kg 73.1 kg 73.4 kg    Weight change: 0.3 kg   Hemodynamic parameters for last 24 hours:    Intake/Output from previous day: 07/15 0701 - 07/16 0700 In: 2194.3 [I.V.:2014.3] Out: 2737 [Urine:2495; Emesis/NG output:75; Chest Tube:167]  Intake/Output this shift: No intake/output data recorded.  Current Meds: Scheduled Meds: . Chlorhexidine Gluconate Cloth  6 each Topical Daily  . docusate  100 mg Oral BID  . enoxaparin (LOVENOX) injection  30 mg Subcutaneous Q24H  . feeding supplement (OSMOLITE 1.5 CAL)  1,000 mL Per Tube Q24H  . HYDROmorphone   Intravenous Q4H  . insulin aspart  0-24 Units Subcutaneous Q4H  . pantoprazole (PROTONIX) IV  40 mg Intravenous Q12H  . polyethylene glycol  17 g Oral Daily  . protein supplement  45 mL Per Tube BID  . sodium chloride flush  10-40 mL Intracatheter Q12H   Continuous Infusions: . sodium chloride 75 mL/hr at 01/03/20 0600  . dexmedetomidine (PRECEDEX) IV infusion Stopped  (01/01/20 2113)  . phenylephrine (NEO-SYNEPHRINE) Adult infusion Stopped (01/02/20 1830)   PRN Meds:.albuterol, diphenhydrAMINE **OR** diphenhydrAMINE, HYDROmorphone (DILAUDID) injection, HYDROmorphone (DILAUDID) injection, morphine injection, naloxone **AND** sodium chloride flush, ondansetron (ZOFRAN) IV, sodium chloride flush, white petrolatum  General appearance: alert, cooperative and mild distress Neurologic: intact Heart: regular rate and rhythm Lungs: clear to auscultation bilaterally. Minimal serous drainage from the left chest tube.  Abdomen: soft, mild tenderness, absent bowel sounds Extremities: all well perfused, no edema or tenderness.  Wound: the neck incision is covered with a dry dressing. the abdominal incision is covered with an Aquacel dressing with minor staining.   Lab Results: CBC: Recent Labs    01/02/20 0408 01/02/20 0408 01/02/20 0618 01/03/20 0420  WBC 11.1*  --   --  9.0  HGB 11.1*   < > 11.2* 9.4*  HCT 33.8*   < > 33.0* 30.5*  PLT 167  --   --  132*   < > = values in this interval not displayed.   BMET:  Recent Labs    01/02/20 0408 01/02/20 0408 01/02/20 0618 01/03/20 0420  NA 135   < > 138 137  K 4.5   < > 4.5 4.6  CL 103  --   --  104  CO2 22  --   --  25  GLUCOSE 136*  --   --  152*  BUN 14  --   --  15  CREATININE 1.11  --   --  1.12  CALCIUM 8.1*  --   --  8.0*   < > = values in this interval not displayed.    CMET: Lab Results  Component Value Date   WBC 9.0 01/03/2020   HGB 9.4 (L) 01/03/2020   HCT 30.5 (L) 01/03/2020   PLT 132 (L) 01/03/2020   GLUCOSE 152 (H) 01/03/2020   CHOL 146 11/07/2018   TRIG 202 (H) 11/07/2018   HDL 35 (L) 11/07/2018   LDLCALC 71 11/07/2018   ALT 17 12/30/2019   AST 18 12/30/2019   NA 137 01/03/2020   K 4.6 01/03/2020   CL 104 01/03/2020   CREATININE 1.12 01/03/2020   BUN 15 01/03/2020   CO2 25 01/03/2020   INR 0.9 12/30/2019   HGBA1C 7.9 (A) 11/13/2019   MICROALBUR 6.3 07/08/2019       PT/INR: No results for input(s): LABPROT, INR in the last 72 hours. Radiology: No results found.   Assessment/Plan: S/P Procedure(s) (LRB): VIDEO BRONCHOSCOPY WITH BRONCHIAL WASHING FOR CULTURES AND GRAM STAIN (N/A) TRANSHIATAL TOTAL ESOPHAGECTOMY COMPLETE WITH NIMS TUBE (N/A) JEJUNOSTOMY (N/A) CHEST TUBE INSERTION (Left)  -POD2 transhiatal esophagectomy for esophageal cancer. Progressing well. Will begin TF via the J-tube at 33ml/hr today then titrate to goal of 33ml/hr tomorrow. Also add Prosource as per dietician recs. Mobilize and begin ambulation today. Allow PCA for another day then convert to Oxycontin liquid / acetaminophen per J-tube.   -Expected acute blood loss anemia- stable, no indication for transfusion. Monitor.  -DVT PPX- continue Lovenox.    Antony Odea, PA-C [phone] 01/03/2020 8:28 AM  Patient up in chair this am, continue to work on increased mobility Start j tube feeding slowly, advance as tolerated  Poss remove left chest tube tomorrow  I have seen and examined James Dunn and agree with the above assessment  and plan.  [name] MD Bee[place] [phone] Office [phone] 01/03/2020 11:30 AM

## 2020-01-03 NOTE — Progress Notes (Signed)
TCTS BRIEF SICU PROGRESS NOTE  2 Days Post-Op  S/P Procedure(s) (LRB): VIDEO BRONCHOSCOPY WITH BRONCHIAL WASHING FOR CULTURES AND GRAM STAIN (N/A) TRANSHIATAL TOTAL ESOPHAGECTOMY COMPLETE WITH NIMS TUBE (N/A) JEJUNOSTOMY (N/A) CHEST TUBE INSERTION (Left)   Stable day NSR w/ stable BP Breathing comfortably w/ O2 sats 95% on 4 L/min UOP adequate  Plan: Continue current plan  Rexene Alberts, MD 01/03/2020 5:06 PM

## 2020-01-03 NOTE — Discharge Summary (Signed)
Physician Discharge Summary  Patient ID: ALEXANDRE FARIES MRN: [id number] DOB/AGE: [birth year] 70 y.o.  Admit date: 01/01/2020 Discharge date: 01/13/2020  Admission Diagnoses:  Esophageal cancer Type 2 diabetes mellitus Dyslipidemia   Discharge Diagnoses:   Esophageal cancer Status post transhiatal esophagectomy with pyloroplasty and feeding jejunostomy Type 2 diabetes mellitus Dyslipidemia Malnutrition of moderate degree Expected acute blood loss anemia    Discharged Condition: good  History of Present Illness:    JAEL KOSTICK 69 y.o. male followed  in the office for  distal esophageal adenocarcinoma.  He presented with  food hanging up especially meat when he tried to swallow up.  He also noted hiccuping with eating.  He had approximately 10 pound weight loss over  2 months from 185-174.  With these symptoms he underwent barium swallow upper endoscopy and was noted to have a distal esophageal mass circumferential.  Biopsy confirmed adenocarcinoma.  Esophageal ultrasound was done staged T3N3 .    The patient  started on weekly chemotherapy and daily radiation therapy starting April 14.  His last day of weekly chemo was 5/17 last radiation therapy was on 5/19  The patient is taking p.o. diet relatively well, pain with swallowing has markedly improved.  He notes he was able to eat steak without feeling like food sticking.  The patient is a previous heavy smoker for more than 40 years, but notes that he quit in October 2020.  He has no prior history of hypertension myocardial infarction angina denies hyperlipidemia previous stroke peripheral vascular disease or renal vascular disease, he does note oral agent controlled diabetes.  CT scans noted significant left main calcification coronary artery calcifications.-He was seen by cardiology and stress test was performed  "Negative perfusion study. No further work up. The patient is not going for a high risk surgery.  Therefore, based on  ACC/AHA guidelines, the patient would be at acceptable risk for the planned procedure without further cardiovascular testing. Call Mr. Cope with the results and send results to Denita Lung, MD"   Hospital Course:   Mr. Cregg was admitted to the hospital for elective surgery on 01/01/2020.  He was prepared and taken to the operating room where transhiatal total esophagectomy with cervical esophagogastrostomy was performed along with pyloroplasty and placement of a feeding jejunostomy.  Left chest tube was also placed at the time of surgery.  Following the procedure, the patient was transferred to the cardiovascular ICU.  He remained hemodynamically stable.  He was weaned from the ventilator and extubated with no difficulty.  Chest tube drainage was minimal.  He was supported initially with IV fluids while n.p.o.  On the second postoperative day, we initiated low flow tube feeding by way of the jejunostomy tube.  This was advanced following day.  He was mobilized and graduall had return of independent mobility.  Pain control was initially managed with hydromorphone PCA.  He was later converted to enteral analgesics after the PCA was discontinued on the 5th post-op day. The NG tube was also removed on the 5th post-op day. A Gastrografin swallow was performed on POD-6 and showed no leak was identified.  His diet was slowly advanced and he is taking small amounts of oral intake.  He does have occasional coughing with regurgitation type symptoms.  He is scheduled to have home nursing assist with nighttime tube feedings.  His blood sugars have been under fair control with sliding scale and his home Glucophage has been restarted.  It will need to be  crushed.  He does have a mild expected acute blood loss anemia and values have stabilized.  He has not required transfusion.  Renal function has remained within normal limits.  Incisions are noted to be healing well without evidence of infection.  His neck drain has been  removed and there is no significant  drainage.  He is tolerating gradually increasing activities using standard protocols and ambulating well.  Oxygen has been weaned and he maintains good saturations on room air.  At the time of discharge the patient is felt to be quite stable.  Full pathology report is listed below.  Consults: None  Significant Diagnostic Studies: esophageal swallow  Result Notes Component 11 d ago  SURGICAL PATHOLOGY SURGICAL PATHOLOGY  CASE: [id number]  PATIENT: Eswin Krausz  Surgical Pathology Report      Clinical History: Esophageal cancer (nt)      FINAL MICROSCOPIC DIAGNOSIS:   A. ESOPHAGUS AND PARTIAL STOMACH, ESOPHAGECTOMY:  - No residual carcinoma identified.  - Reactive changes, mixed inflammation, and ulcer.  - Sixteen of sixteen lymph nodes negative for carcinoma (0/16).  - See oncology table.   ONCOLOGY TABLE:   ESOPHAGUS:   No residual carcinoma in current specimen and where applicable the table  refers to patient's biopsy [id number].   Procedure: Esophogogastrectomy  Tumor Site: Distal esophagus  Relationship of Tumor to Esophagogastric Junction: Cannot be assessed  Tumor Size: No residual carcinoma  Histologic Type: Adenocarcinoma per biopsy  Histologic Grade: G2, moderately differentiated on biopsy  Tumor Extension: No residual carcinoma  Margins: Uninvolved by tumor  Treatment Effect: Present, with no viable cancer cells (complete  response, score 0)  Lymphovascular Invasion: Not identified  Regional Lymph Nodes:    Number of Lymph Nodes Involved: 0    Number of Lymph Nodes Examined: 16  Pathologic Stage Classification (pTNM, AJCC 8th Edition): ypT0, ypN0  Ancillary Studies: Can be performed upon request on biopsy  Representative Tumor Block: Biopsy  Comment(s): None   (v4.1.0.0)    INTRAOPERATIVE DIAGNOSIS:   A. Esophagus and proximal stomach-present on margins: "Margins: No  overt malignancy"  Intraoperative  diagnosis rendered by Dr. Melina Copa at 1405 on 01/01/2020.   GROSS DESCRIPTION:   The specimen is received fresh for rapid intraoperative consultation,  and consists of an esophagus and proximal stomach. The esophagus  measures 19.5 cm in length, and the portion of stomach measures 11.5 x  5.5 x 2.3 cm. Tags are present on the specimen designating the fundus  and lesser curvature aspect of the stomach. The serosal surface is  tan-red, hyperemic, with a small amount of attached yellow adipose  tissue and red-brown muscle. The proximal and distal margins are  submitted for frozen section analysis, with the distal margin submitted  from the fundus to lesser curvature aspect. Opening the specimen  reveals a 4.3 x 3.0 cm tan-red, hyperemic and possibly ulcerated lesion  identified at the gastroesophageal junction, the surrounding mucosa is  tan-red and slightly granular (measuring 6.0 cm in total length). The  uninvolved mucosa of the esophagus is tan-gray, and the stomach displays  tan-brown mucosa with normal folding and punctate areas of red-brown  discoloration. The granular mucosa measures approximately 4.5 cm from  the distal fundus margin, and 13.0 cm from the proximal margin.  Sectioning through the area of interest reveals a slightly thickened and  fibrotic cut surface, without gross invasion. 18 tan-pink possible  lymph nodes are identified, ranging from 0.1 to 0.7 cm in greatest  dimension. Representative sections  are submitted in 28 cassettes.  1-6 = distal gastric margin frozen section remnant, sequentially  submitted from fundus to lesser curvature  7 = proximal esophagus frozen section remnant  8 = representative sections of esophagus  9-23 = granular area of interest, entirely submitted  24 = uninvolved stomach  25 and 26 = 5 possible lymph nodes, each  27 and 28 = 4 possible lymph nodes, each Craig Staggers 01/06/2020)    Final Diagnosis performed by Vicente Males, MD.   Electronically signed  01/06/2020  Technical component performed at [employer]. [facility], [place] [address] [place], [place] [address].  Professional component performed at [facility],  [place] [address] [place], [place] [address].  Immunohistochemistry Technical component (if applicable) was performed  at [facility]. [address] [place],  [place], [place] [address].  IMMUNOHISTOCHEMISTRY DISCLAIMER (if applicable):  Some of these immunohistochemical stains may have been developed and the  performance characteristics determine by [facility]. Some  may not have been cleared or approved by the U.S. Food and Drug  Administration. The FDA has determined that such clearance or approval  is not necessary. This test is used for clinical purposes. It should not  be regarded as investigational or for research. This laboratory is  certified under the [place]  (CLIA-88) as qualified to perform high complexity clinical laboratory  testing. The controls stained appropriately.   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  SURGICAL PROCEDURE:   1.  Bronchoscopy.   2.  Transhiatal total esophagectomy with cervical esophagogastrostomy.   3.  Pyloroplasty.   4.  Feeding jejunostomy.   5.  Placement of left chest tube.  SURGEON:  Lanelle Bal, MD  FIRST ASSISTANT:  Enid Cutter PA.  BRIEF HISTORY:  The patient is a 69 year old male who presented with a 10-pound weight loss and difficulty swallowing.  Biopsy showed adenocarcinoma.  A transesophageal  ultrasound noted a T3 lesion with possibly 3 peritumor lymph nodes.  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  PET scan showed mild upper abdominal metabolic activity in the lymph nodes, thought to be more inflammatory in nature than metastatic.  With the T3 lesion in place, it was recommended to the  patient to proceed with radiation, chemotherapy and then consider surgery.  The patient has been a long-term smoker, quitting in October 2020.  Cardiac clearance was obtained.  A negative perfusion study was done.  Risks and options of surgery were  discussed with the patient in detail and he was agreeable and signed informed consent.    Discharge Exam: Blood pressure (!) 108/63, pulse 79, temperature 98.8 F (37.1 C), temperature source Oral, resp. rate 18, height 5\' 7"  (1.702 m), weight 73.4 kg, SpO2 99 %.  General appearance:alert, cooperative andnodistress Neurologic:intact Heart:regular rate and rhythm Lungs:Breath sounds are clear. Abdomen:Soft,NT Extremities:No edema or tenderness Wound:The abdominal incision is c/d/i.The left neck dressing is dry.   Disposition:    Allergies as of 01/13/2020   No Known Allergies     Medication List    STOP taking these medications   esomeprazole 40 MG  capsule Commonly known as: NEXIUM   oxyCODONE 5 MG immediate release tablet Commonly known as: Oxy IR/ROXICODONE Replaced by: oxyCODONE 5 MG/5ML solution     TAKE these medications   acetaminophen 160 MG/5ML solution Commonly known as: TYLENOL Take 15.6 mLs (500 mg total) by mouth every 6 (six) hours as needed for mild pain.   albuterol 108 (90 Base) MCG/ACT inhaler Commonly known as: VENTOLIN HFA TAKE 2 PUFFS BY MOUTH EVERY 6 HOURS AS NEEDED FOR WHEEZE What changed: See the new instructions.   atorvastatin 10 MG tablet Commonly known as: LIPITOR Take 1 tablet (10 mg total) by mouth daily.   feeding supplement (OSMOLITE 1.5 CAL) Liqd Place  1,610 mLs into feeding tube daily.   feeding supplement (GLUCERNA SHAKE) Liqd Take 237 mLs by mouth 3 (three) times daily between meals.   lidocaine 2 % solution Commonly known as: XYLOCAINE Use as directed 10 mLs in the mouth or throat every 4 (four) hours as needed. Swallow every 4 hours as needed for esophagitis.   metFORMIN 500 MG tablet Commonly known as: GLUCOPHAGE Take 1 tablet (500 mg total) by mouth 2 (two) times daily with a meal. What changed:   how much to take  when to take this   oxyCODONE 5 MG/5ML solution Commonly known as: ROXICODONE Place 5 mLs (5 mg total) into feeding tube every 6 (six) hours as needed for up to 7 days for severe pain. Replaces: oxyCODONE 5 MG immediate release tablet   pantoprazole 40 MG tablet Commonly known as: PROTONIX TAKE 1 TABLET BY MOUTH EVERY DAY   prochlorperazine 10 MG tablet Commonly known as: COMPAZINE Take 1 tablet (10 mg total) by mouth every 6 (six) hours as needed (Nausea or vomiting).   sucralfate 1 g tablet Commonly known as: Carafate Take 1 tablet (1 g total) by mouth 4 (four) times daily. Dissolve each tablet in 15 cc water before use.            Durable Medical Equipment  (From admission, onward)         Start     Ordered   01/13/20 1002  For home use only DME Tube feeding  Once       Comments: Transition to nocturnal tube feeds via J-tube: - Osmolite 1.5 @ 115 ml/hr x 14 hours from 1800 to 0800 (total of 1610 ml)  > 90 days And tubefeed pump   01/13/20 [place] [place] [facility] Follow up.   Specialty: Home Health Services Why: [place] information: [place] [address] [phone]        [name], MD. Go on 02/06/2020.   Specialty: Cardiothoracic Surgery Why: Your appointment is on Thursday, 02/06/20 at 1:00pm. Please arrive 30 minutes early for a chest x-ray to be performed by [facility]  located on the first floor of the same building.  Contact information: [place] [address] [phone]        [name], MD. Go on 02/03/2020.   Specialties: Hematology, Oncology Why: Your appointment is at 8:20am. Please arrive at [place] for lab work.  Contact information: [place] [address] [phone]               Signed: Antony Odea 01/13/2020, 10:57 AM

## 2020-01-04 ENCOUNTER — Inpatient Hospital Stay (HOSPITAL_COMMUNITY): Payer: PPO

## 2020-01-04 LAB — GLUCOSE, CAPILLARY
Glucose-Capillary: 159 mg/dL — ABNORMAL HIGH (ref 70–99)
Glucose-Capillary: 155 mg/dL — ABNORMAL HIGH (ref 70–99)
Glucose-Capillary: 151 mg/dL — ABNORMAL HIGH (ref 70–99)
Glucose-Capillary: 153 mg/dL — ABNORMAL HIGH (ref 70–99)
Glucose-Capillary: 145 mg/dL — ABNORMAL HIGH (ref 70–99)

## 2020-01-04 LAB — BASIC METABOLIC PANEL
Anion gap: 8 (ref 5–15)
BUN: 10 mg/dL (ref 8–23)
CO2: 26 mmol/L (ref 22–32)
Calcium: 8 mg/dL — ABNORMAL LOW (ref 8.9–10.3)
Chloride: 104 mmol/L (ref 98–111)
Creatinine, Ser: 0.92 mg/dL (ref 0.61–1.24)
GFR calc Af Amer: >60 mL/min (ref >60)
GFR calc non Af Amer: >60 mL/min (ref >60)
Glucose, Bld: 173 mg/dL — ABNORMAL HIGH (ref 70–99)
Potassium: 3.7 mmol/L (ref 3.5–5.1)
Sodium: 138 mmol/L (ref 135–145)

## 2020-01-04 LAB — CBC
HCT: 28 % — ABNORMAL LOW (ref 39.0–52.0)
Hemoglobin: 8.9 g/dL — ABNORMAL LOW (ref 13.0–17.0)
MCH: 30.6 pg (ref 26.0–34.0)
MCHC: 31.8 g/dL (ref 30.0–36.0)
MCV: 96.2 fL (ref 80.0–100.0)
Platelets: 135 10*3/uL — ABNORMAL LOW (ref 150–400)
RBC: 2.91 MIL/uL — ABNORMAL LOW (ref 4.22–5.81)
RDW: 15 % (ref 11.5–15.5)
WBC: 8.5 10*3/uL (ref 4.0–10.5)
nRBC: 0 % (ref 0.0–0.2)

## 2020-01-04 MED ORDER — ACETAMINOPHEN 160 MG/5ML PO SOLN
500.0000 mg | ORAL | Status: DC | PRN
  Administered 2020-01-07 – 2020-01-11 (×14): 500 mg via ORAL
  Filled 2020-01-04 (×15): qty 20.3

## 2020-01-04 MED ORDER — OSMOLITE 1.5 CAL PO LIQD
1000.0000 mL | ORAL | Status: DC
  Administered 2020-01-05 – 2020-01-06 (×2): 1000 mL
  Filled 2020-01-04 (×3): qty 1000

## 2020-01-04 MED ORDER — OXYCODONE HCL 5 MG/5ML PO SOLN
10.0000 mg | ORAL | Status: DC | PRN
  Administered 2020-01-06 – 2020-01-13 (×34): 10 mg via ORAL
  Filled 2020-01-04 (×34): qty 10

## 2020-01-04 NOTE — Progress Notes (Signed)
      [address]       [place] [address]             [phone]        CARDIOTHORACIC SURGERY PROGRESS NOTE   R3 Days Post-Op Procedure(s) (LRB): VIDEO BRONCHOSCOPY WITH BRONCHIAL WASHING FOR CULTURES AND GRAM STAIN (N/A) TRANSHIATAL TOTAL ESOPHAGECTOMY COMPLETE WITH NIMS TUBE (N/A) JEJUNOSTOMY (N/A) CHEST TUBE INSERTION (Left)  Subjective: Complaining about everything, particularly the discomfort of NG tube  Objective: Vital signs: BP Readings from Last 1 Encounters:  01/04/20 112/67   Pulse Readings from Last 1 Encounters:  01/04/20 81   Resp Readings from Last 1 Encounters:  01/04/20 (!) 28   Temp Readings from Last 1 Encounters:  01/04/20 98 F (36.7 C) (Axillary)    Hemodynamics:    Physical Exam:  Rhythm:   Sinus tach  Breath sounds: Diminished right base, o/w clear  Heart sounds:  RRR  Incisions:  Dressings intact  Abdomen:  Soft, non-distended, quiet  Extremities:  Warm, well-perfused  Chest tubes:  low volume thin serosanguinous output, no air leak   Intake/Output from previous day: 07/16 0701 - 07/17 0700 In: 1449.9 [I.V.:1319.9; NG/GT:130] Out: 1990 [Urine:1700; Chest Tube:290] Intake/Output this shift: No intake/output data recorded.  Lab Results:  CBC: Recent Labs    01/03/20 0420 01/04/20 0405  WBC 9.0 8.5  HGB 9.4* 8.9*  HCT 30.5* 28.0*  PLT 132* 135*    BMET:  Recent Labs    01/03/20 0420 01/04/20 0455  NA 137 138  K 4.6 3.7  CL 104 104  CO2 25 26  GLUCOSE 152* 173*  BUN 15 10  CREATININE 1.12 0.92  CALCIUM 8.0* 8.0*     PT/INR:  No results for input(s): LABPROT, INR in the last 72 hours.  CBG (last 3)  Recent Labs    01/03/20 2318 01/04/20 0331 01/04/20 0730  GLUCAP 162* 159* 155*    ABG    Component Value Date/Time   PHART 7.380 01/02/2020 0618   PCO2ART 37.3 01/02/2020 0618   PO2ART 94 01/02/2020 0618   HCO3 22.3 01/02/2020 0618   TCO2 23 01/02/2020 0618   ACIDBASEDEF 3.0 (H)  01/02/2020 0618   O2SAT 98.0 01/02/2020 0618    CXR: Opacity right side c/w small-moderate right pleural effusion, clear on left  Assessment/Plan: S/P Procedure(s) (LRB): VIDEO BRONCHOSCOPY WITH BRONCHIAL WASHING FOR CULTURES AND GRAM STAIN (N/A) TRANSHIATAL TOTAL ESOPHAGECTOMY COMPLETE WITH NIMS TUBE (N/A) JEJUNOSTOMY (N/A) CHEST TUBE INSERTION (Left)  Clinically stable D/C chest tube Increase tube feeds to 30 mL/hr Start Roxicet via J tube Mobilize  Rexene Alberts, MD 01/04/2020 9:51 AM

## 2020-01-05 ENCOUNTER — Inpatient Hospital Stay (HOSPITAL_COMMUNITY): Payer: PPO

## 2020-01-05 LAB — CBC
HCT: 26.8 % — ABNORMAL LOW (ref 39.0–52.0)
Hemoglobin: 8.3 g/dL — ABNORMAL LOW (ref 13.0–17.0)
MCH: 29.5 pg (ref 26.0–34.0)
MCHC: 31 g/dL (ref 30.0–36.0)
MCV: 95.4 fL (ref 80.0–100.0)
Platelets: 130 10*3/uL — ABNORMAL LOW (ref 150–400)
RBC: 2.81 MIL/uL — ABNORMAL LOW (ref 4.22–5.81)
RDW: 14.9 % (ref 11.5–15.5)
WBC: 6 10*3/uL (ref 4.0–10.5)
nRBC: 0 % (ref 0.0–0.2)

## 2020-01-05 LAB — BASIC METABOLIC PANEL
Anion gap: 6 (ref 5–15)
BUN: 10 mg/dL (ref 8–23)
CO2: 30 mmol/L (ref 22–32)
Calcium: 8.1 mg/dL — ABNORMAL LOW (ref 8.9–10.3)
Chloride: 105 mmol/L (ref 98–111)
Creatinine, Ser: 0.73 mg/dL (ref 0.61–1.24)
GFR calc Af Amer: >60 mL/min (ref >60)
GFR calc non Af Amer: >60 mL/min (ref >60)
Glucose, Bld: 166 mg/dL — ABNORMAL HIGH (ref 70–99)
Potassium: 3.3 mmol/L — ABNORMAL LOW (ref 3.5–5.1)
Sodium: 141 mmol/L (ref 135–145)

## 2020-01-05 LAB — GLUCOSE, CAPILLARY
Glucose-Capillary: 127 mg/dL — ABNORMAL HIGH (ref 70–99)
Glucose-Capillary: 142 mg/dL — ABNORMAL HIGH (ref 70–99)
Glucose-Capillary: 143 mg/dL — ABNORMAL HIGH (ref 70–99)
Glucose-Capillary: 143 mg/dL — ABNORMAL HIGH (ref 70–99)
Glucose-Capillary: 157 mg/dL — ABNORMAL HIGH (ref 70–99)
Glucose-Capillary: 161 mg/dL — ABNORMAL HIGH (ref 70–99)

## 2020-01-05 MED ORDER — POTASSIUM CHLORIDE 10 MEQ/50ML IV SOLN: 10.0000 meq | INTRAVENOUS | Status: DC

## 2020-01-05 MED ORDER — POTASSIUM CHLORIDE 10 MEQ/50ML IV SOLN
10.0000 meq | INTRAVENOUS | Status: AC
  Administered 2020-01-05 (×3): 10 meq via INTRAVENOUS
  Filled 2020-01-05 (×2): qty 50

## 2020-01-05 MED ORDER — POTASSIUM CHLORIDE 10 MEQ/50ML IV SOLN
INTRAVENOUS | Status: AC
  Filled 2020-01-05: qty 50

## 2020-01-05 MED ORDER — POTASSIUM CHLORIDE 10 MEQ/50ML IV SOLN
10.0000 meq | INTRAVENOUS | Status: AC
  Administered 2020-01-05 (×6): 10 meq via INTRAVENOUS
  Filled 2020-01-05 (×6): qty 50

## 2020-01-05 NOTE — Progress Notes (Addendum)
      [address]       [place] [address]             [phone]        CARDIOTHORACIC SURGERY PROGRESS NOTE   R4 Days Post-Op Procedure(s) (LRB): VIDEO BRONCHOSCOPY WITH BRONCHIAL WASHING FOR CULTURES AND GRAM STAIN (N/A) TRANSHIATAL TOTAL ESOPHAGECTOMY COMPLETE WITH NIMS TUBE (N/A) JEJUNOSTOMY (N/A) CHEST TUBE INSERTION (Left)  Subjective: No new complaints.  Ambulated 2 laps around ICU.  Wants NG out.  Still using PCA.  Possibly slight flatus, no BM yet  Objective: Vital signs: BP Readings from Last 1 Encounters:  01/05/20 134/78   Pulse Readings from Last 1 Encounters:  01/05/20 (!) 101   Resp Readings from Last 1 Encounters:  01/05/20 19   Temp Readings from Last 1 Encounters:  01/05/20 (!) 97.4 F (36.3 C) (Axillary)    Hemodynamics:    Physical Exam:  Rhythm:   sinus  Breath sounds: clear  Heart sounds:  RRR  Incisions:  Dressings intact  Abdomen:  Soft, non-distended, non-tender  Extremities:  Warm, well-perfused    Intake/Output from previous day: 07/17 0701 - 07/18 0700 In: 592.7 [I.V.:353.7] Out: 750 [Urine:750] Intake/Output this shift: Total I/O In: -  Out: 200 [Urine:200]  Lab Results:  CBC: Recent Labs    01/04/20 0405 01/05/20 0252  WBC 8.5 6.0  HGB 8.9* 8.3*  HCT 28.0* 26.8*  PLT 135* 130*    BMET:  Recent Labs    01/04/20 0455 01/05/20 0252  NA 138 141  K 3.7 3.3*  CL 104 105  CO2 26 30  GLUCOSE 173* 166*  BUN 10 10  CREATININE 0.92 0.73  CALCIUM 8.0* 8.1*     PT/INR:  No results for input(s): LABPROT, INR in the last 72 hours.  CBG (last 3)  Recent Labs    01/05/20 0009 01/05/20 0340 01/05/20 0722  GLUCAP 161* 142* 143*    ABG    Component Value Date/Time   PHART 7.380 01/02/2020 0618   PCO2ART 37.3 01/02/2020 0618   PO2ART 94 01/02/2020 0618   HCO3 22.3 01/02/2020 0618   TCO2 23 01/02/2020 0618   ACIDBASEDEF 3.0 (H) 01/02/2020 0618   O2SAT 98.0 01/02/2020 0618     CXR: Mild bibasilar opacity c/w small effusions  Assessment/Plan: S/P Procedure(s) (LRB): VIDEO BRONCHOSCOPY WITH BRONCHIAL WASHING FOR CULTURES AND GRAM STAIN (N/A) TRANSHIATAL TOTAL ESOPHAGECTOMY COMPLETE WITH NIMS TUBE (N/A) JEJUNOSTOMY (N/A) CHEST TUBE INSERTION (Left)  Overall doing well POD4 Mobilize Pulm toilet Advance tube feeds Keep NG and PCA one more day Supplement potassium Transfer PCU  Rexene Alberts, MD 01/05/2020 9:42 AM

## 2020-01-06 LAB — BASIC METABOLIC PANEL
Anion gap: 7 (ref 5–15)
BUN: 11 mg/dL (ref 8–23)
CO2: 27 mmol/L (ref 22–32)
Calcium: 8.1 mg/dL — ABNORMAL LOW (ref 8.9–10.3)
Chloride: 106 mmol/L (ref 98–111)
Creatinine, Ser: 0.75 mg/dL (ref 0.61–1.24)
GFR calc Af Amer: >60 mL/min (ref >60)
GFR calc non Af Amer: >60 mL/min (ref >60)
Glucose, Bld: 161 mg/dL — ABNORMAL HIGH (ref 70–99)
Potassium: 3.7 mmol/L (ref 3.5–5.1)
Sodium: 140 mmol/L (ref 135–145)

## 2020-01-06 LAB — GLUCOSE, CAPILLARY
Glucose-Capillary: 108 mg/dL — ABNORMAL HIGH (ref 70–99)
Glucose-Capillary: 125 mg/dL — ABNORMAL HIGH (ref 70–99)
Glucose-Capillary: 134 mg/dL — ABNORMAL HIGH (ref 70–99)
Glucose-Capillary: 153 mg/dL — ABNORMAL HIGH (ref 70–99)
Glucose-Capillary: 155 mg/dL — ABNORMAL HIGH (ref 70–99)
Glucose-Capillary: 161 mg/dL — ABNORMAL HIGH (ref 70–99)
Glucose-Capillary: 171 mg/dL — ABNORMAL HIGH (ref 70–99)

## 2020-01-06 LAB — SURGICAL PATHOLOGY

## 2020-01-06 MED ORDER — POTASSIUM CHLORIDE 10 MEQ/50ML IV SOLN
10.0000 meq | INTRAVENOUS | Status: AC
  Administered 2020-01-06 (×3): 10 meq via INTRAVENOUS
  Filled 2020-01-06 (×3): qty 50

## 2020-01-06 MED ORDER — OSMOLITE 1.5 CAL PO LIQD
1000.0000 mL | ORAL | Status: DC
  Filled 2020-01-06: qty 1000

## 2020-01-06 MED ORDER — OSMOLITE 1.5 CAL PO LIQD
1000.0000 mL | ORAL | Status: DC
  Administered 2020-01-06 – 2020-01-07 (×2): 1000 mL
  Filled 2020-01-06 (×2): qty 1000

## 2020-01-06 NOTE — Progress Notes (Signed)
Initial Nutrition Assessment  DOCUMENTATION CODES:   Non-severe (moderate) malnutrition in context of chronic illness  INTERVENTION:   Tube Feeding:  Osmolite 1.5 at 65 ml/hr ProSource TF 45 mL BID Provides 2420 kcals, 120 g of protein, 1186 mL of free water Meets 100% estimated calorie and protein needs  Monitor diet advanacement  NUTRITION DIAGNOSIS:   Moderate Malnutrition related to chronic illness as evidenced by mild fat depletion, mild muscle depletion.   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 Pt started on weekly chemo and daily radiation therapy on 10/02/19. Last day of chemo was 5/17, last radiation therapy was 5/19. PMH includes long term tobacco use (stoped 03/2019), DM   7/14 Transhiatal total esophagectomy, pyloroplasty, J-tube, Chest tube  NG tube removed today. Pt remains NPO. Plan for gastrografin swallow tomorrow  Tolerating Osmolite 1.5 at 30 ml/hr; increased to 50 ml/hr with Pro-Source TF at 45 mL BID via J-tube  Labs: reviewed Meds: ss novolog   Diet Order:   Diet Order    None      EDUCATION NEEDS:   Education needs have been addressed  Skin:  Skin Assessment: Reviewed RN Assessment  Last BM:  no documented BM  Height:   Ht Readings from Last 1 Encounters:  01/01/20 5\' 7"  (1.702 m)    Weight:   Wt Readings from Last 1 Encounters:  01/05/20 73.4 kg    BMI:  Body mass index is 25.34 kg/m.  Estimated Nutritional Needs:   Kcal:  2200-2500 kcals  Protein:  110-130 g  Fluid:  >/= 2 L   Kerman Passey MS, RDN, LDN, CNSC Registered Dietitian III Clinical Nutrition RD Pager and On-Call Pager Number Located in [place]

## 2020-01-06 NOTE — Progress Notes (Signed)
TCTS DAILY ICU PROGRESS NOTE                   [address]            [place] [address]          [phone]   5 Days Post-Op Procedure(s) (LRB): VIDEO BRONCHOSCOPY WITH BRONCHIAL WASHING FOR CULTURES AND GRAM STAIN (N/A) TRANSHIATAL TOTAL ESOPHAGECTOMY COMPLETE WITH NIMS TUBE (N/A) JEJUNOSTOMY (N/A) CHEST TUBE INSERTION (Left)  Total Length of Stay:  LOS: 5 days   Subjective: Patient awake alert neurologically intact sitting up in the chair this morning, has walked around the unit multiple times without difficulty                        Waiting for bed in stepdown          Objective: Vital signs in last 24 hours: Temp:  [97.7 F (36.5 C)-98.4 F (36.9 C)] 98.3 F (36.8 C) (07/19 1100) Pulse Rate:  [58-111] 93 (07/19 1100) Cardiac Rhythm: Normal sinus rhythm (07/19 0800) Resp:  [12-33] 22 (07/19 1100) BP: (112-143)/(60-89) 142/75 (07/19 1100) SpO2:  [94 %-100 %] 99 % (07/19 1100)  Filed Weights   01/03/20 0610 01/04/20 0500 01/05/20 0500  Weight: 73.4 kg 73.7 kg 73.4 kg    Weight change:    Hemodynamic parameters for last 24 hours:    Intake/Output from previous day: 07/18 0701 - 07/19 0700 In: 317.1 [I.V.:33.1] Out: 1775 [Urine:1700; Emesis/NG output:75]  Intake/Output this shift: No intake/output data recorded.  Current Meds: Scheduled Meds: . Chlorhexidine Gluconate Cloth  6 each Topical Daily  . docusate  100 mg Oral BID  . enoxaparin (LOVENOX) injection  30 mg Subcutaneous Q24H  . feeding supplement (OSMOLITE 1.5 CAL)  1,000 mL Per Tube Q24H  . feeding supplement (PROSource TF)  45 mL Per Tube BID  . insulin aspart  0-24 Units Subcutaneous Q4H  . pantoprazole (PROTONIX) IV  40 mg Intravenous Q12H  . polyethylene glycol  17 g Oral Daily  . sodium chloride flush  10-40 mL Intracatheter Q12H   Continuous Infusions: . sodium chloride Stopped (01/05/20 1024)   PRN Meds:.oxyCODONE **AND** acetaminophen, diphenhydrAMINE **OR**  diphenhydrAMINE, HYDROmorphone (DILAUDID) injection, HYDROmorphone (DILAUDID) injection, morphine injection, naloxone **AND** sodium chloride flush, ondansetron (ZOFRAN) IV, sodium chloride flush, white petrolatum  General appearance: alert and cooperative Neurologic: intact Heart: regular rate and rhythm, S1, S2 normal, no murmur, click, rub or gallop Lungs: diminished breath sounds bibasilar Abdomen: soft, non-tender; bowel sounds normal; no masses,  no organomegaly Extremities: extremities normal, atraumatic, no cyanosis or edema and Homans sign is negative, no sign of DVT Wound: wounds intact, no draiage from neck penrose drain   Lab Results: CBC: Recent Labs    01/04/20 0405 01/05/20 0252  WBC 8.5 6.0  HGB 8.9* 8.3*  HCT 28.0* 26.8*  PLT 135* 130*   BMET:  Recent Labs    01/05/20 0252 01/06/20 0408  NA 141 140  K 3.3* 3.7  CL 105 106  CO2 30 27  GLUCOSE 166* 161*  BUN 10 11  CREATININE 0.73 0.75  CALCIUM 8.1* 8.1*    CMET: Lab Results  Component Value Date   WBC 6.0 01/05/2020   HGB 8.3 (L) 01/05/2020   HCT 26.8 (L) 01/05/2020   PLT 130 (L) 01/05/2020   GLUCOSE 161 (H) 01/06/2020   CHOL 146 11/07/2018   TRIG 202 (H) 11/07/2018   HDL 35 (L)  11/07/2018   LDLCALC 71 11/07/2018   ALT 17 12/30/2019   AST 18 12/30/2019   NA 140 01/06/2020   K 3.7 01/06/2020   CL 106 01/06/2020   CREATININE 0.75 01/06/2020   BUN 11 01/06/2020   CO2 27 01/06/2020   INR 0.9 12/30/2019   HGBA1C 7.9 (A) 11/13/2019   MICROALBUR 6.3 07/08/2019      PT/INR: No results for input(s): LABPROT, INR in the last 72 hours. Radiology: No results found.   Assessment/Plan: S/P Procedure(s) (LRB): VIDEO BRONCHOSCOPY WITH BRONCHIAL WASHING FOR CULTURES AND GRAM STAIN (N/A) TRANSHIATAL TOTAL ESOPHAGECTOMY COMPLETE WITH NIMS TUBE (N/A) JEJUNOSTOMY (N/A) CHEST TUBE INSERTION (Left) Mobilize Plan removal of NG tube today Gastrografin  swallow tomorrow  D/c PCA pump Advance J tube  feeding    [name] 01/06/2020 1:05 PM

## 2020-01-07 ENCOUNTER — Encounter (HOSPITAL_COMMUNITY): Payer: Self-pay | Admitting: Cardiothoracic Surgery

## 2020-01-07 ENCOUNTER — Inpatient Hospital Stay (HOSPITAL_COMMUNITY): Payer: PPO

## 2020-01-07 LAB — GLUCOSE, CAPILLARY
Glucose-Capillary: 115 mg/dL — ABNORMAL HIGH (ref 70–99)
Glucose-Capillary: 137 mg/dL — ABNORMAL HIGH (ref 70–99)
Glucose-Capillary: 172 mg/dL — ABNORMAL HIGH (ref 70–99)
Glucose-Capillary: 174 mg/dL — ABNORMAL HIGH (ref 70–99)
Glucose-Capillary: 191 mg/dL — ABNORMAL HIGH (ref 70–99)
Glucose-Capillary: 203 mg/dL — ABNORMAL HIGH (ref 70–99)

## 2020-01-07 MED ORDER — IOHEXOL 300 MG/ML  SOLN
150.0000 mL | Freq: Once | INTRAMUSCULAR | Status: AC | PRN
  Administered 2020-01-07: 25 mL via ORAL

## 2020-01-07 NOTE — TOC Progression Note (Addendum)
Transition of Care [facility]) - Progression Note    Patient Details  Name: James Dunn MRN: [id number] Date of Birth: [birth year]  [facility]) CM/SW Contact  Zenon Mayo, RN Phone Number: 01/07/2020, 1:38 PM  Clinical Narrative:    NCM spoke with patient, he states to call his wife regarding [facility] services.  NCM left vm for wife to return call and left the agency list in patient room for wife since he states she should be here shortly.  He will need HHRN.    16:22- NCM spoke with patient wife in room, NCM explained HHRN to her and that she ,the wife will be doing the tube feeds mostly.  Will make referral.       Expected Discharge Plan and Services                                                 Social Determinants of Health (SDOH) Interventions    Readmission Risk Interventions No flowsheet data found.

## 2020-01-07 NOTE — Progress Notes (Addendum)
[address]       [place] [address]             [phone]       6 Days Post-Op Procedure(s) (LRB): VIDEO BRONCHOSCOPY WITH BRONCHIAL WASHING FOR CULTURES AND GRAM STAIN (N/A) TRANSHIATAL TOTAL ESOPHAGECTOMY COMPLETE WITH NIMS TUBE (N/A) JEJUNOSTOMY (N/A) CHEST TUBE INSERTION (Left) Subjective: Transfer to 2C yesterday. Sitting up in bed, said he felt some shortness of breath last night and felt better after O2 cannula was replaced.  TF via J-tube at 53ml/hr.  No nausea, BM last evening.  Pain control adequate.   Objective: Vital signs in last 24 hours: Temp:  [97.9 F (36.6 C)-100 F (37.8 C)] 100 F (37.8 C) (07/20 0405) Pulse Rate:  [67-93] 82 (07/20 0405) Cardiac Rhythm: Normal sinus rhythm (07/20 0714) Resp:  [12-32] 20 (07/20 0405) BP: (110-156)/(61-81) 132/81 (07/20 0405) SpO2:  [90 %-100 %] 93 % (07/20 0405)     Intake/Output from previous day: 07/19 0701 - 07/20 0700 In: 1463.2 [NG/GT:1463.2] Out: 600 [Urine:600] Intake/Output this shift: No intake/output data recorded.  General appearance: alert, cooperative and mild distress Neurologic: intact Heart: regular rate and rhythm Lungs: Breath sounds are clear and equal.  Abdomen: Soft, expected peri-incisional tenderness. Active bowel sounds.  Extremities: No edema or tenderness Wound: The right neck and abdominal dressings were removed. Both sites are clean and dry. The left neck drain is covered with a dry dressing .   Lab Results: Recent Labs    01/05/20 0252  WBC 6.0  HGB 8.3*  HCT 26.8*  PLT 130*   BMET:  Recent Labs    01/05/20 0252 01/06/20 0408  NA 141 140  K 3.3* 3.7  CL 105 106  CO2 30 27  GLUCOSE 166* 161*  BUN 10 11  CREATININE 0.73 0.75  CALCIUM 8.1* 8.1*    PT/INR: No results for input(s): LABPROT, INR in the last 72 hours. ABG    Component Value Date/Time   PHART 7.380 01/02/2020 0618   HCO3 22.3 01/02/2020 0618   TCO2 23 01/02/2020 0618    ACIDBASEDEF 3.0 (H) 01/02/2020 0618   O2SAT 98.0 01/02/2020 0618   CBG (last 3)  Recent Labs    01/06/20 2012 01/06/20 2347 01/07/20 0420  GLUCAP 161* 155* 203*   SURGICAL PATHOLOGY  CASE: [id number]  PATIENT: Javien Riggin  Surgical Pathology Report      Clinical History: Esophageal cancer (nt)      FINAL MICROSCOPIC DIAGNOSIS:   A. ESOPHAGUS AND PARTIAL STOMACH, ESOPHAGECTOMY:  - No residual carcinoma identified.  - Reactive changes, mixed inflammation, and ulcer.  - Sixteen of sixteen lymph nodes negative for carcinoma (0/16).  - See oncology table.   ONCOLOGY TABLE:   ESOPHAGUS:   No residual carcinoma in current specimen and where applicable the table  refers to patient's biopsy [id number].   Procedure: Esophogogastrectomy  Tumor Site: Distal esophagus  Relationship of Tumor to Esophagogastric Junction: Cannot be assessed  Tumor Size: No residual carcinoma  Histologic Type: Adenocarcinoma per biopsy  Histologic Grade: G2, moderately differentiated on biopsy  Tumor Extension: No residual carcinoma  Margins: Uninvolved by tumor  Treatment Effect: Present, with no viable cancer cells (complete  response, score 0)  Lymphovascular Invasion: Not identified  Regional Lymph Nodes:    Number of Lymph Nodes Involved: 0    Number of Lymph Nodes Examined: 16  Pathologic Stage Classification (pTNM, AJCC 8th Edition): ypT0, ypN0  Ancillary Studies:  Can be performed upon request on biopsy  Representative Tumor Block: Biopsy  Comment(s): None   (v4.1.0.0)    INTRAOPERATIVE DIAGNOSIS:   A. Esophagus and proximal stomach-present on margins: "Margins: No  overt malignancy"  Intraoperative diagnosis rendered by Dr. Melina Copa at 1405 on 01/01/2020.   GROSS DESCRIPTION:   The specimen is received fresh for rapid intraoperative consultation,  and consists of an esophagus and proximal stomach. The esophagus  measures 19.5 cm in length, and the portion of  stomach measures 11.5 x  5.5 x 2.3 cm. Tags are present on the specimen designating the fundus  and lesser curvature aspect of the stomach. The serosal surface is  tan-red, hyperemic, with a small amount of attached yellow adipose  tissue and red-brown muscle. The proximal and distal margins are  submitted for frozen section analysis, with the distal margin submitted  from the fundus to lesser curvature aspect. Opening the specimen  reveals a 4.3 x 3.0 cm tan-red, hyperemic and possibly ulcerated lesion  identified at the gastroesophageal junction, the surrounding mucosa is  tan-red and slightly granular (measuring 6.0 cm in total length). The  uninvolved mucosa of the esophagus is tan-gray, and the stomach displays  tan-brown mucosa with normal folding and punctate areas of red-brown  discoloration. The granular mucosa measures approximately 4.5 cm from  the distal fundus margin, and 13.0 cm from the proximal margin.  Sectioning through the area of interest reveals a slightly thickened and  fibrotic cut surface, without gross invasion. 18 tan-pink possible  lymph nodes are identified, ranging from 0.1 to 0.7 cm in greatest  dimension. Representative sections are submitted in 28 cassettes.  1-6 = distal gastric margin frozen section remnant, sequentially  submitted from fundus to lesser curvature  7 = proximal esophagus frozen section remnant  8 = representative sections of esophagus  9-23 = granular area of interest, entirely submitted  24 = uninvolved stomach  25 and 26 = 5 possible lymph nodes, each  27 and 28 = 4 possible lymph nodes, each Craig Staggers 01/06/2020)    Final Diagnosis performed by Vicente Males, MD.  Electronically signed  01/06/2020  Technical component performed at [employer]. [facility], [place] [address], [place], [place] [address].  Professional component performed at [facility],  [place] [address], [place], [place] [address].    Immunohistochemistry Technical component (if applicable) was performed  at [facility]. [address] [place],  [place], [place] [address].  IMMUNOHISTOCHEMISTRY DISCLAIMER (if applicable):  Some of these immunohistochemical stains may have been developed and the  performance characteristics determine by [facility]. Some  may not have been cleared or approved by the U.S. Food and Drug  Administration. The FDA has determined that such clearance or approval  is not necessary. This test is used for clinical purposes. It should not  be regarded as investigational or for research. This laboratory is  certified under the [place]  (CLIA-88) as qualified to perform high complexity clinical laboratory  testing. The controls stained appropriately.   Assessment/Plan:    -POD-6 transhiatal esophagectomy for adenocarcinoma. Progressing well . Tolerating TF at goal. Plan gastrografin swallow today.  Path shows clear resected margins and all nodes (16/16) negative for tumor.   -Type 2 DM- Glucose 100-200 past 24 hours. Continue SSI.   -DVT PPX- continue to encourage ambulation. Continue enoxaparin SQ daily.    LOS: 6 days    Antony Odea, PA-C  01/07/2020  Discussed final path with patient- complete response   Cancer Staging Esophageal cancer [facility]) Staging form: Esophagus - Adenocarcinoma, AJCC 8th Edition - Clinical stage from 09/13/2019: Stage IVA (cT3, cN2, cM0) - Signed by [name], MD on 09/22/2019 - Pathologic stage from 01/07/2020: Stage I (ypT0, pN0, cM0, G2) - Signed by [name], MD on 01/07/2020 Gastrografin swallow today - coughing with swallowing, no leak No drainage  from neck incision  Start sips of clear liquids - reminded patient to sit up when drinking and tuck chin Continue tube feeding for now  I have seen and examined [name] and agree with the above assessment  and  plan.  [name] MD [place] [phone] Office [phone] 01/07/2020 11:07 AM

## 2020-01-07 NOTE — Plan of Care (Signed)
  Problem: Education: Goal: Knowledge of General Education information will improve Description: Including pain rating scale, medication(s)/side effects and non-pharmacologic comfort measures Outcome: Progressing   Problem: Health Behavior/Discharge Planning: Goal: Ability to manage health-related needs will improve Outcome: Progressing   Problem: Clinical Measurements: Goal: Ability to maintain clinical measurements within normal limits will improve Outcome: Progressing Goal: Diagnostic test results will improve Outcome: Progressing Goal: Respiratory complications will improve Outcome: Progressing   Problem: Activity: Goal: Risk for activity intolerance will decrease Outcome: Progressing   Problem: Nutrition: Goal: Adequate nutrition will be maintained Outcome: Progressing   

## 2020-01-08 ENCOUNTER — Other Ambulatory Visit: Payer: Self-pay | Admitting: Radiation Oncology

## 2020-01-08 LAB — GLUCOSE, CAPILLARY
Glucose-Capillary: 114 mg/dL — ABNORMAL HIGH (ref 70–99)
Glucose-Capillary: 142 mg/dL — ABNORMAL HIGH (ref 70–99)
Glucose-Capillary: 152 mg/dL — ABNORMAL HIGH (ref 70–99)
Glucose-Capillary: 170 mg/dL — ABNORMAL HIGH (ref 70–99)
Glucose-Capillary: 175 mg/dL — ABNORMAL HIGH (ref 70–99)
Glucose-Capillary: 186 mg/dL — ABNORMAL HIGH (ref 70–99)

## 2020-01-08 MED ORDER — OSMOLITE 1.5 CAL PO LIQD
1000.0000 mL | ORAL | Status: DC
  Administered 2020-01-08 – 2020-01-13 (×6): 1000 mL
  Filled 2020-01-08 (×10): qty 1000

## 2020-01-08 NOTE — TOC Progression Note (Addendum)
Transition of Care [facility]) - Progression Note    Patient Details  Name: James Dunn MRN: [id number] Date of Birth: 1950-11-12  Transition of Care [facility]) CM/SW Contact  Zenon Mayo, RN Phone Number: 01/08/2020, 4:25 PM  Clinical Narrative:    NCM made referral to [facility], [facility], [facility], [facility], [facility], [facility], [facility], [facility] they do not have the staff for [facility] , so they are unable to take referral.  NCM contacted wife to inform her.  She states she has been learning how to do the tube feeds with [place] so she probably will not need a HHRN.  NCM informed Suzette Battiest of this also .    7/22- NCM trying to reach out to [facility] to see if they will be able help with [facility].  17:22- NCM notified Director of this , and he has reached out to [facility] to see if they can help accommodate this need for [facility].  7/23- NCM contacted [facility] to see if they will be able to help [place] with this.  I will fax information to them this am, asked Patrick Jupiter to put in [facility] order , he will be on nocturnal tube feeds.  Cyndi with [facility] states they will not be able to do this for patient.        Expected Discharge Plan and Services                                                 Social Determinants of Health (SDOH) Interventions    Readmission Risk Interventions No flowsheet data found.

## 2020-01-08 NOTE — Plan of Care (Signed)

## 2020-01-08 NOTE — Progress Notes (Addendum)
[address]       [place] [address]             [phone]      7 Days Post-Op Procedure(s) (LRB): VIDEO BRONCHOSCOPY WITH BRONCHIAL WASHING FOR CULTURES AND GRAM STAIN (N/A) TRANSHIATAL TOTAL ESOPHAGECTOMY COMPLETE WITH NIMS TUBE (N/A) JEJUNOSTOMY (N/A) CHEST TUBE INSERTION (Left) Subjective: Sitting up in bed, sipping on clear liquid tray. Says "I'm re-learning how to swallow". No nausea, having bowel movements.    Objective: Vital signs in last 24 hours: Temp:  [98 F (36.7 C)-98.5 F (36.9 C)] 98.4 F (36.9 C) (07/21 0418) Pulse Rate:  [71-87] 87 (07/21 0418) Cardiac Rhythm: Normal sinus rhythm (07/21 0705) Resp:  [14-20] 17 (07/21 0418) BP: (111-137)/(59-79) 137/79 (07/21 0418) SpO2:  [94 %-98 %] 98 % (07/21 0418)     Intake/Output from previous day: 07/20 0701 - 07/21 0700 In: 1025 [NG/GT:1025] Out: 1 [Stool:1] Intake/Output this shift: No intake/output data recorded.  General appearance: alert, cooperative and mild distress Neurologic: intact Heart: regular rate and rhythm Lungs: Breath sounds are clear and equal.  Abdomen: Soft, mild peri-incisional tenderness. Active bowel sounds.  Extremities: No edema or tenderness Wound: The abdominal incision is c/d/i.  The left neck drain is covered with a dry dressing .   Lab Results: No results for input(s): WBC, HGB, HCT, PLT in the last 72 hours. BMET:  Recent Labs    01/06/20 0408  NA 140  K 3.7  CL 106  CO2 27  GLUCOSE 161*  BUN 11  CREATININE 0.75  CALCIUM 8.1*    PT/INR: No results for input(s): LABPROT, INR in the last 72 hours. ABG    Component Value Date/Time   PHART 7.380 01/02/2020 0618   HCO3 22.3 01/02/2020 0618   TCO2 23 01/02/2020 0618   ACIDBASEDEF 3.0 (H) 01/02/2020 0618   O2SAT 98.0 01/02/2020 0618   CBG (last 3)  Recent Labs    01/07/20 1941 01/07/20 2355 01/08/20 0418  GLUCAP 191* 172* 152*    ESOPHOGRAM/BARIUM  SWALLOW  TECHNIQUE: Single contrast examination was performed using water soluble.  FLUOROSCOPY TIME:  Fluoroscopy Time:  48 seconds  COMPARISON:  None.  FINDINGS: Technically limited due to difficulties in patient positioning and inability to swallow significant volume. There is no leak identified. There is frank aspiration with cough response. A small volume of contrast was identified passing out of the stomach. Study was stopped due to significant aspiration with each swallow.  IMPRESSION: Technically limited examination. No leak identified. Aspiration with cough response.   Electronically Signed   By: Macy Mis M.D.   On: 01/07/2020 09:20  Assessment/Plan: S/P Procedure(s) (LRB): VIDEO BRONCHOSCOPY WITH BRONCHIAL WASHING FOR CULTURES AND GRAM STAIN (N/A) TRANSHIATAL TOTAL ESOPHAGECTOMY COMPLETE WITH NIMS TUBE (N/A) JEJUNOSTOMY (N/A) CHEST TUBE INSERTION (Left)  -POD-7 transhiatal esophagectomy for adenocarcinoma. Progressing well . Tolerating TF at goal. Plan gastrografin swallow today.  Path negative for residual tumor and all nodes (16/16) negative for tumor.  Gastrografin swallow reviewed by Dr. Servando Snare. Started on clear liquids PO and he is tolerating this in small amounts so far. Continue support with TF.   -Type 2 DM- Glucose 150-200 past 24 hours. Continue SSI.   -DVT PPX- continue to encourage ambulation. Continue enoxaparin SQ daily.     LOS: 7 days    Antony Odea, Hershal Coria [phone] 01/08/2020  Patient seen Continue clears for Wednesday and advance thursday I have seen and examined  [name] and agree with the above assessment  and plan.  [name] MD Beeper [phone] Office [phone] 01/09/2020 8:43 AM

## 2020-01-08 NOTE — Progress Notes (Signed)
Nutrition Follow-up  RD working remotely.  DOCUMENTATION CODES:   Non-severe (moderate) malnutrition in context of chronic illness  INTERVENTION:   Tube feeding via J-tube: - Osmolite 1.5 @ 65 ml/hr (1560 ml/day) - ProSource TF 45 ml BID  Tube feeding regimen provides 2420 kcal, 120 grams of protein, and 1186 ml of H2O (meets 100% of needs).  - Will monitor for continued diet advancement  NUTRITION DIAGNOSIS:   Moderate Malnutrition related to chronic illness as evidenced by mild fat depletion, mild muscle depletion.  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 Pt started on weekly chemo and daily radiation therapy on 10/02/19. Last day of chemo was 5/17, last radiation therapy was 5/19. PMH includes long term tobacco use (stoped 03/2019), DM.  7/14 - s/p transhiatal total esophagectomy, pyloroplasty, J-tube, chest tube 7/19 - NGT removed 7/20 - s/p gastrografin swallow showing no leak, diet advanced to clear liquids  Pt tolerating tube feeds at 50 ml/hr. Will increase to goal of 65 ml/hr. Noted plan for pt to d/c home with TF.  Per TCTS note, pt taking small amounts of clear liquids and tolerating well. Pt denies having any nausea.  Weight stable.  Medications reviewed and include: colace, SSI q 4 hours, protonix, mirala  Labs reviewed. CBG's: 114-191 x 24 hours  Diet Order:   Diet Order            Diet clear liquid Room service appropriate? Yes; Fluid consistency: Thin  Diet effective now                 EDUCATION NEEDS:   Education needs have been addressed  Skin:  Skin Assessment: Skin Integrity Issues: Incisions: abdomen, chest  Last BM:  01/07/20  Height:   Ht Readings from Last 1  Encounters:  01/01/20 5' 7"  (1.702 m)    Weight:   Wt Readings from Last 1 Encounters:  01/05/20 73.4 kg    BMI:  Body mass index is 25.34 kg/m.  Estimated Nutritional Needs:   Kcal:  2200-2500 kcals  Protein:  110-130 g  Fluid:  >/= 2 L    Gaynell Face, MS, RD, LDN Inpatient Clinical Dietitian Please see AMiON for contact information.

## 2020-01-08 NOTE — Plan of Care (Signed)
  Problem: Education: Goal: Knowledge of General Education information will improve Description: Including pain rating scale, medication(s)/side effects and non-pharmacologic comfort measures Outcome: Progressing   Problem: Health Behavior/Discharge Planning: Goal: Ability to manage health-related needs will improve Outcome: Progressing   Problem: Clinical Measurements: Goal: Will remain free from infection Outcome: Progressing Goal: Diagnostic test results will improve Outcome: Progressing Goal: Respiratory complications will improve Outcome: Progressing   Problem: Activity: Goal: Risk for activity intolerance will decrease Outcome: Progressing   

## 2020-01-09 LAB — BASIC METABOLIC PANEL
Anion gap: 6 (ref 5–15)
BUN: 14 mg/dL (ref 8–23)
CO2: 27 mmol/L (ref 22–32)
Calcium: 8.3 mg/dL — ABNORMAL LOW (ref 8.9–10.3)
Chloride: 104 mmol/L (ref 98–111)
Creatinine, Ser: 0.86 mg/dL (ref 0.61–1.24)
GFR calc Af Amer: >60 mL/min (ref >60)
GFR calc non Af Amer: >60 mL/min (ref >60)
Glucose, Bld: 163 mg/dL — ABNORMAL HIGH (ref 70–99)
Potassium: 4.2 mmol/L (ref 3.5–5.1)
Sodium: 137 mmol/L (ref 135–145)

## 2020-01-09 LAB — CBC
HCT: 27.4 % — ABNORMAL LOW (ref 39.0–52.0)
Hemoglobin: 8.7 g/dL — ABNORMAL LOW (ref 13.0–17.0)
MCH: 30 pg (ref 26.0–34.0)
MCHC: 31.8 g/dL (ref 30.0–36.0)
MCV: 94.5 fL (ref 80.0–100.0)
Platelets: 181 10*3/uL (ref 150–400)
RBC: 2.9 MIL/uL — ABNORMAL LOW (ref 4.22–5.81)
RDW: 14.4 % (ref 11.5–15.5)
WBC: 5.6 10*3/uL (ref 4.0–10.5)
nRBC: 0 % (ref 0.0–0.2)

## 2020-01-09 LAB — GLUCOSE, CAPILLARY
Glucose-Capillary: 161 mg/dL — ABNORMAL HIGH (ref 70–99)
Glucose-Capillary: 201 mg/dL — ABNORMAL HIGH (ref 70–99)
Glucose-Capillary: 122 mg/dL — ABNORMAL HIGH (ref 70–99)
Glucose-Capillary: 201 mg/dL — ABNORMAL HIGH (ref 70–99)
Glucose-Capillary: 155 mg/dL — ABNORMAL HIGH (ref 70–99)

## 2020-01-09 MED ORDER — PANTOPRAZOLE SODIUM 40 MG PO PACK
40.0000 mg | PACK | Freq: Two times a day (BID) | ORAL | Status: DC
  Administered 2020-01-09 – 2020-01-13 (×8): 40 mg
  Filled 2020-01-09 (×8): qty 20

## 2020-01-09 NOTE — Plan of Care (Signed)

## 2020-01-09 NOTE — Progress Notes (Addendum)
      [address]       [place] [address]             [phone]       8 Days Post-Op Procedure(s) (LRB): VIDEO BRONCHOSCOPY WITH BRONCHIAL WASHING FOR CULTURES AND GRAM STAIN (N/A) TRANSHIATAL TOTAL ESOPHAGECTOMY COMPLETE WITH NIMS TUBE (N/A) JEJUNOSTOMY (N/A) CHEST TUBE INSERTION (Left) Subjective: Resting in bed. No new problems or concerns. Continue to take clear liquids in small amounts. Having bowel movements. Denies nausea.  Objective: Vital signs in last 24 hours: Temp:  [98 F (36.7 C)-99.5 F (37.5 C)] 98.5 F (36.9 C) (07/22 0350) Pulse Rate:  [71-90] 79 (07/22 0350) Cardiac Rhythm: Normal sinus rhythm (07/21 1937) Resp:  [15-24] 19 (07/22 0350) BP: (92-126)/(56-72) 92/56 (07/22 0350) SpO2:  [93 %-100 %] 96 % (07/22 0350)   Intake/Output from previous day: No intake/output data recorded. Intake/Output this shift: No intake/output data recorded.  General appearance:alert, cooperative and no distress Neurologic:intact Heart:regular rate and rhythm Lungs:Breath sounds are clear. Abdomen:Soft, mild peri-incisional tenderness. Active bowel sounds. Extremities:No edema or tenderness Wound:The abdominal incision is c/d/i.  The left neck drain is covered with a dry dressing .  Lab Results: Recent Labs    01/09/20 0242  WBC 5.6  HGB 8.7*  HCT 27.4*  PLT 181   BMET:  Recent Labs    01/09/20 0242  NA 137  K 4.2  CL 104  CO2 27  GLUCOSE 163*  BUN 14  CREATININE 0.86  CALCIUM 8.3*    PT/INR: No results for input(s): LABPROT, INR in the last 72 hours. ABG    Component Value Date/Time   PHART 7.380 01/02/2020 0618   HCO3 22.3 01/02/2020 0618   TCO2 23 01/02/2020 0618   ACIDBASEDEF 3.0 (H) 01/02/2020 0618   O2SAT 98.0 01/02/2020 0618   CBG (last 3)  Recent Labs    01/08/20 1950 01/08/20 2351 01/09/20 0343  GLUCAP 142* 175* 161*    Assessment/Plan: S/P Procedure(s) (LRB): VIDEO BRONCHOSCOPY WITH BRONCHIAL  WASHING FOR CULTURES AND GRAM STAIN (N/A) TRANSHIATAL TOTAL ESOPHAGECTOMY COMPLETE WITH NIMS TUBE (N/A) JEJUNOSTOMY (N/A) CHEST TUBE INSERTION (Left)  -POD-7 transhiatal esophagectomy for adenocarcinoma. Progressing well . Tolerating TF at goal of 83ml/hr and also tolerating small amounts of clear liquids. Will consider advancing diet today and begin slowly withdrawing the left neck Penrose drain. If he is able to tolerate full liquids in sufficient quantities will switch to nocturnal TF in the next day or so.   -Type 2 DM- Continues to have acceptable control with SSI.   -DVT PPX- continue to encourage ambulation. Continue enoxaparin SQ daily.  -Disposition- Will eventually discharge to home with nocturnal TF. Several home health agencies have been contacted by the Care Mgt. team but none are able to accept new clients due to staff shortages. James Dunn wife has been participating in training at the bedside with RN's to manage the tube feeding / pump at home.    LOS: 8 days    James Dunn, [place] [phone] 01/09/2020  Patient concerned , told that there were no home nurses to help after discharge  Tolerating tube feeding. Will advance from clear liquids  No drainage from neck drain- drain "cracked" today will advance out over next 1-2 days  I have seen and examined James Dunn and agree with the above assessment  and plan.  James Isaac MD Beeper [phone] Office [phone] 01/09/2020 8:41 AM

## 2020-01-09 NOTE — Plan of Care (Signed)
  Problem: Education: Goal: Knowledge of General Education information will improve Description: Including pain rating scale, medication(s)/side effects and non-pharmacologic comfort measures Outcome: Progressing   Problem: Health Behavior/Discharge Planning: Goal: Ability to manage health-related needs will improve Outcome: Progressing   Problem: Clinical Measurements: Goal: Ability to maintain clinical measurements within normal limits will improve Outcome: Progressing Goal: Will remain free from infection Outcome: Progressing Goal: Diagnostic test results will improve Outcome: Progressing Goal: Respiratory complications will improve Outcome: Progressing Goal: Cardiovascular complication will be avoided Outcome: Progressing   Problem: Activity: Goal: Risk for activity intolerance will decrease Outcome: Progressing   Problem: Nutrition: Goal: Adequate nutrition will be maintained Outcome: Progressing   Problem: Coping: Goal: Level of anxiety will decrease Outcome: Progressing   Problem: Elimination: Goal: Will not experience complications related to bowel motility Outcome: Progressing Goal: Will not experience complications related to urinary retention Outcome: Progressing   Problem: Pain Managment: Goal: General experience of comfort will improve Outcome: Progressing   Problem: Safety: Goal: Ability to remain free from injury will improve Outcome: Progressing   Problem: Skin Integrity: Goal: Risk for impaired skin integrity will decrease Outcome: Progressing   Problem: Education: Goal: Knowledge of the prescribed therapeutic regimen will improve Outcome: Progressing   Problem: Bowel/Gastric: Goal: Gastrointestinal status for postoperative course will improve Outcome: Progressing   Problem: Nutritional: Goal: Ability to achieve adequate nutritional intake will improve Outcome: Progressing   Problem: Clinical Measurements: Goal: Postoperative  complications will be avoided or minimized Outcome: Progressing   Problem: Respiratory: Goal: Ability to maintain a clear airway will improve Outcome: Progressing   Problem: Skin Integrity: Goal: Demonstration of wound healing without infection will improve Outcome: Progressing   

## 2020-01-10 ENCOUNTER — Inpatient Hospital Stay (HOSPITAL_COMMUNITY): Payer: PPO

## 2020-01-10 LAB — GLUCOSE, CAPILLARY
Glucose-Capillary: 143 mg/dL — ABNORMAL HIGH (ref 70–99)
Glucose-Capillary: 150 mg/dL — ABNORMAL HIGH (ref 70–99)
Glucose-Capillary: 161 mg/dL — ABNORMAL HIGH (ref 70–99)
Glucose-Capillary: 191 mg/dL — ABNORMAL HIGH (ref 70–99)
Glucose-Capillary: 196 mg/dL — ABNORMAL HIGH (ref 70–99)
Glucose-Capillary: 209 mg/dL — ABNORMAL HIGH (ref 70–99)
Glucose-Capillary: 215 mg/dL — ABNORMAL HIGH (ref 70–99)

## 2020-01-10 NOTE — Plan of Care (Signed)
  Problem: Education: Goal: Knowledge of General Education information will improve Description: Including pain rating scale, medication(s)/side effects and non-pharmacologic comfort measures Outcome: Progressing   Problem: Health Behavior/Discharge Planning: Goal: Ability to manage health-related needs will improve Outcome: Progressing   Problem: Clinical Measurements: Goal: Ability to maintain clinical measurements within normal limits will improve Outcome: Progressing Goal: Will remain free from infection Outcome: Progressing Goal: Diagnostic test results will improve Outcome: Progressing Goal: Respiratory complications will improve Outcome: Progressing Goal: Cardiovascular complication will be avoided Outcome: Progressing   Problem: Activity: Goal: Risk for activity intolerance will decrease Outcome: Progressing   Problem: Nutrition: Goal: Adequate nutrition will be maintained Outcome: Progressing   Problem: Coping: Goal: Level of anxiety will decrease Outcome: Progressing   Problem: Elimination: Goal: Will not experience complications related to bowel motility Outcome: Progressing Goal: Will not experience complications related to urinary retention Outcome: Progressing   Problem: Pain Managment: Goal: General experience of comfort will improve Outcome: Progressing   Problem: Safety: Goal: Ability to remain free from injury will improve Outcome: Progressing   Problem: Skin Integrity: Goal: Risk for impaired skin integrity will decrease Outcome: Progressing   Problem: Education: Goal: Knowledge of the prescribed therapeutic regimen will improve Outcome: Progressing   Problem: Bowel/Gastric: Goal: Gastrointestinal status for postoperative course will improve Outcome: Progressing   Problem: Nutritional: Goal: Ability to achieve adequate nutritional intake will improve Outcome: Progressing   Problem: Clinical Measurements: Goal: Postoperative  complications will be avoided or minimized Outcome: Progressing   Problem: Respiratory: Goal: Ability to maintain a clear airway will improve Outcome: Progressing   Problem: Skin Integrity: Goal: Demonstration of wound healing without infection will improve Outcome: Progressing   

## 2020-01-10 NOTE — TOC Progression Note (Addendum)
Transition of Care [facility]) - Progression Note    Patient Details  Name: James Dunn MRN: [id number] Date of Birth: January 09, 1951  Transition of Care [facility]) CM/SW Contact  Zenon Mayo, RN Phone Number: 01/10/2020, 4:45 PM  Clinical Narrative:    NCM received call from [place], he states that [place] will be able to help [place] with this patient.  NCM received call from [facility] with [facility] stating they will be able to help [place] with this patient, they will need to know when he is going home. NCM spoke with [facility], he spoke with MD and they are thinking will be ready to dc on  Monday.  NCM left message for Carolynn Sayers to call back to see about pump set up.  16:35- NCM received call from [place] with [facility], she states if patient is discharging on [place] can have a HHRN out on Monday evening.  NCM contacted wife to let her know , contacted Carolynn Sayers and informed her, she states they will have the tube feeding and the pump at patient's home on Monday .  NCM informed Cyndi with [facility] which they will be working and checking on his mobility.   Expected Discharge Plan: [place] Barriers to Discharge: Continued Medical Work up  Expected Discharge Plan and Services Expected Discharge Plan: [place]   Discharge Planning Services: CM Consult Post Acute Care Choice: [place] arrangements for the past 2 months: Single Family Home                   DME Agency: NA       HH Arranged: RN [place] Agency: [facility] Date [place]: 01/10/20 Time [place]: [id number] Representative spoke with at [place]: [place] ([place]) Interventions    Readmission Risk Interventions No flowsheet data found.

## 2020-01-10 NOTE — Progress Notes (Addendum)
[address]       [place] [address]             [phone]       9 Days Post-Op Procedure(s) (LRB): VIDEO BRONCHOSCOPY WITH BRONCHIAL WASHING FOR CULTURES AND GRAM STAIN (N/A) TRANSHIATAL TOTAL ESOPHAGECTOMY COMPLETE WITH NIMS TUBE (N/A) JEJUNOSTOMY (N/A) CHEST TUBE INSERTION (Left) Subjective: Asking how to order breakfast.  Says he has not had a full liquid meal since orders placed yesterday. Says he is still having some cough with thin liquids.  He also reports an episode of shortness of breath yesterday and was back on O2 temporarily.   Objective: Vital signs in last 24 hours: Temp:  [98.3 F (36.8 C)-99.5 F (37.5 C)] 98.4 F (36.9 C) (07/23 0358) Pulse Rate:  [75-102] 80 (07/23 0358) Cardiac Rhythm: Normal sinus rhythm (07/23 0707) Resp:  [15-27] 16 (07/23 0358) BP: (96-117)/(54-70) 110/70 (07/23 0358) SpO2:  [93 %-100 %] 95 % (07/23 0358)    Intake/Output from previous day: 07/22 0701 - 07/23 0700 In: -  Out: 200 [Urine:200] Intake/Output this shift: No intake/output data recorded.    General appearance:alert, cooperative and no distress Neurologic:intact Heart:regular rate and rhythm Lungs:Breath sounds are clear. Abdomen:Soft, NT Extremities:No edema or tenderness Wound:The abdominal incision is c/d/i.The left neck dressing (a single folded 4x4) was about 50% saturated with thin serous drainage.   Lab Results: Recent Labs    01/09/20 0242  WBC 5.6  HGB 8.7*  HCT 27.4*  PLT 181   BMET:  Recent Labs    01/09/20 0242  NA 137  K 4.2  CL 104  CO2 27  GLUCOSE 163*  BUN 14  CREATININE 0.86  CALCIUM 8.3*    PT/INR: No results for input(s): LABPROT, INR in the last 72 hours. ABG    Component Value Date/Time   PHART 7.380 01/02/2020 0618   HCO3 22.3 01/02/2020 0618   TCO2 23 01/02/2020 0618   ACIDBASEDEF 3.0 (H) 01/02/2020 0618   O2SAT 98.0 01/02/2020 0618   CBG (last 3)  Recent Labs    01/09/20 2011  01/10/20 0004 01/10/20 0356  GLUCAP 155* 209* 143*    Assessment/Plan: S/P Procedure(s) (LRB): VIDEO BRONCHOSCOPY WITH BRONCHIAL WASHING FOR CULTURES AND GRAM STAIN (N/A) TRANSHIATAL TOTAL ESOPHAGECTOMY COMPLETE WITH NIMS TUBE (N/A) JEJUNOSTOMY (N/A) CHEST TUBE INSERTION (Left)  -POD-9transhiatal esophagectomy for adenocarcinoma. Progressing well . Tolerating TF at goal of 67ml/hr.  Says he has had very little full liquid intake since diet advanced yesterday. Oral intake not recorded. The Penrose drain was withdrawn  ~2 1/2 cm and dressing was changed.  Check CXR today.   -Type 2 DM- Continues to have acceptable control with SSI.   -DVT PPX- continue to encourage ambulation. Continue enoxaparin SQ daily.  -Disposition- Will eventually discharge to home with nocturnal TF. Several home health agencies have been contacted by the Care Mgt. team but none are able to accept new clients due to staff shortages. Mr. Ress wife has been participating in training at the bedside with RN's to manage the tube feeding / pump at home.     LOS: 9 days    Antony Odea, PA-C [phone] 01/10/2020  minimal drainage from neck drain , advance more today Chest xray today reviewed looks good  Will advance diet some today I have seen and examined [name] and agree with the above assessment  and plan.  [name] MD Beeper [phone] Office [phone] 01/10/2020  1:03 PM

## 2020-01-10 NOTE — Progress Notes (Signed)
Nutrition Follow-up  DOCUMENTATION CODES:   Non-severe (moderate) malnutrition in context of chronic illness  INTERVENTION:   Continue tube feeding via J-tube: - Osmolite 1.5 @ 65 ml/hr (1560 ml/day) - ProSource TF 45 ml BID  Continuous tube feeding regimen provides 2420 kcal, 120 grams of protein, and 1186 ml of H2O (meets 100% of needs).   When appropriate, recommend transitioning to nocturnal tube feeding regimen via J-tube: - Osmolite 1.5 @ 115 ml/hr x 12 hours from 1800 to 0600 (total of 1380 ml) - ProSource TF 90 ml BID  Nocturnal tube feeding regimen would provide 2230 kcal, 131 grams of protein, and 1052 ml of H2O (100% of kcal needs, 100% of protein needs).  NUTRITION DIAGNOSIS:   Moderate Malnutrition related to chronic illness as evidenced by mild fat depletion, mild muscle depletion.  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 Pt started on weekly chemo and daily radiation therapy on 10/02/19. Last day of chemo was 5/17, last radiation therapy was 5/19. PMH includes long term tobacco use (stoped 03/2019), DM  7/14 - s/p transhiatal total esophagectomy, pyloroplasty, J-tube, chest tube 7/19 - NGT removed 7/20 - s/p gastrografin swallow showing no leak, diet advanced to clear liquids 7/22 - diet advanced to nectar-thick bariatric full liquid  Spoke with pt at bedside. Pt reports some difficulty swallowing and sensation of food "coming back up" so that he has to spit into emesis bag. Pt denies N/V. Pt is not pleased with current diet order (bariatric full liquid, nectar-thick) and asking for help in ordering meals. Noted MD advanced diet to mechanical soft today. RD will make pt "with assist" so that he  will have help in ordering meals.  RD will leave recommendations for nocturnal tube feeds as pt will discharge home with nocturnal tube feeds.  Medications reviewed and include: colace, SSI q 4 hours, protonix, miralax  Labs reviewed: hemoglobin 8.7 CBG's: 143-209 x 24 hours  Diet Order:   Diet Order            DIET DYS 2 Room service appropriate? Yes; Fluid consistency: Thin  Diet effective now                 EDUCATION NEEDS:   Education needs have been addressed  Skin:  Skin Assessment: Skin Integrity Issues: Incisions: abdomen, chest  Last BM:  01/10/20  Height:   Ht Readings from Last 1 Encounters:  01/01/20 5' 7"  (1.702 m)    Weight:   Wt Readings from Last 1 Encounters:  01/05/20 73.4 kg    BMI:  Body mass index is 25.34 kg/m.  Estimated Nutritional Needs:   Kcal:  2200-2500 kcals  Protein:  110-130 grams  Fluid:  >/= 2 L    Gaynell Face, MS, RD, LDN Inpatient Clinical Dietitian Please see AMiON for contact information.

## 2020-01-10 NOTE — TOC Progression Note (Addendum)
Transition of Care [facility]) - Progression Note    Patient Details  Name: TRIGO WINTERBOTTOM MRN: [id number] Date of Birth: June 01, 1951  [facility]) CM/SW Contact  Zenon Mayo, RN Phone Number: 01/10/2020, 2:37 PM  Clinical Narrative:    NCM received call from [place], he states that [employer] will be able to help [place] with this patient.  NCM received call from [facility] with [facility] stating they will be able to help [place] with this patient, they will need to know when he is going home. NCM spoke with [facility], he spoke with MD and they are thinking will be ready to dc on  Monday.  NCM left message for Carolynn Sayers to call back to see about pump set up.  16:35- NCM received call from [place] with [facility], she states if patient is discharging on [place] can have a HHRN out on Monday evening.  NCM contacted wife to let her know , will contact Carolynn Sayers to let her know.        Expected Discharge Plan and Services                                                 Social Determinants of Health (SDOH) Interventions    Readmission Risk Interventions No flowsheet data found.

## 2020-01-11 LAB — GLUCOSE, CAPILLARY
Glucose-Capillary: 194 mg/dL — ABNORMAL HIGH (ref 70–99)
Glucose-Capillary: 215 mg/dL — ABNORMAL HIGH (ref 70–99)
Glucose-Capillary: 167 mg/dL — ABNORMAL HIGH (ref 70–99)
Glucose-Capillary: 161 mg/dL — ABNORMAL HIGH (ref 70–99)
Glucose-Capillary: 200 mg/dL — ABNORMAL HIGH (ref 70–99)

## 2020-01-11 LAB — CBC
HCT: 28.6 % — ABNORMAL LOW (ref 39.0–52.0)
Hemoglobin: 9.2 g/dL — ABNORMAL LOW (ref 13.0–17.0)
MCH: 30.3 pg (ref 26.0–34.0)
MCHC: 32.2 g/dL (ref 30.0–36.0)
MCV: 94.1 fL (ref 80.0–100.0)
Platelets: 241 10*3/uL (ref 150–400)
RBC: 3.04 MIL/uL — ABNORMAL LOW (ref 4.22–5.81)
RDW: 14.3 % (ref 11.5–15.5)
WBC: 7.6 10*3/uL (ref 4.0–10.5)
nRBC: 0 % (ref 0.0–0.2)

## 2020-01-11 LAB — BASIC METABOLIC PANEL
Anion gap: 8 (ref 5–15)
BUN: 16 mg/dL (ref 8–23)
CO2: 26 mmol/L (ref 22–32)
Calcium: 8.4 mg/dL — ABNORMAL LOW (ref 8.9–10.3)
Chloride: 101 mmol/L (ref 98–111)
Creatinine, Ser: 0.94 mg/dL (ref 0.61–1.24)
GFR calc Af Amer: >60 mL/min (ref >60)
GFR calc non Af Amer: >60 mL/min (ref >60)
Glucose, Bld: 173 mg/dL — ABNORMAL HIGH (ref 70–99)
Potassium: 4.4 mmol/L (ref 3.5–5.1)
Sodium: 135 mmol/L (ref 135–145)

## 2020-01-11 MED ORDER — METFORMIN HCL 500 MG PO TABS
1000.0000 mg | ORAL_TABLET | Freq: Every day | ORAL | Status: DC
  Administered 2020-01-12 – 2020-01-13 (×2): 1000 mg via ORAL
  Filled 2020-01-11 (×2): qty 2

## 2020-01-11 MED ORDER — INSULIN ASPART 100 UNIT/ML ~~LOC~~ SOLN
0.0000 [IU] | Freq: Three times a day (TID) | SUBCUTANEOUS | Status: DC
  Administered 2020-01-11 – 2020-01-12 (×4): 4 [IU] via SUBCUTANEOUS
  Administered 2020-01-12: 8 [IU] via SUBCUTANEOUS
  Administered 2020-01-12 (×2): 4 [IU] via SUBCUTANEOUS
  Administered 2020-01-13: 8 [IU] via SUBCUTANEOUS

## 2020-01-11 MED ORDER — INSULIN ASPART 100 UNIT/ML ~~LOC~~ SOLN: 0.0000 [IU] | SUBCUTANEOUS | Status: DC

## 2020-01-11 NOTE — Progress Notes (Addendum)
[address]       [place] [address]             [phone]      10 Days Post-Op Procedure(s) (LRB): VIDEO BRONCHOSCOPY WITH BRONCHIAL WASHING FOR CULTURES AND GRAM STAIN (N/A) TRANSHIATAL TOTAL ESOPHAGECTOMY COMPLETE WITH NIMS TUBE (N/A) JEJUNOSTOMY (N/A) CHEST TUBE INSERTION (Left) Subjective: Appetite, oral intake improving  Objective: Vital signs in last 24 hours: Temp:  [98.6 F (37 C)-99.8 F (37.7 C)] 98.8 F (37.1 C) (07/24 0800) Pulse Rate:  [80-85] 85 (07/24 0800) Cardiac Rhythm: Normal sinus rhythm (07/24 0721) Resp:  [16-20] 20 (07/24 0800) BP: (108-122)/(51-71) 112/51 (07/24 0800) SpO2:  [94 %-98 %] 95 % (07/24 0800)  Hemodynamic parameters for last 24 hours:    Intake/Output from previous day: 07/23 0701 - 07/24 0700 In: 2345.3 [P.O.:240; NG/GT:2045.3] Out: 300 [Urine:300] Intake/Output this shift: No intake/output data recorded.  General appearance: alert, cooperative and no distress Heart: regular rate and rhythm Lungs: min dim in bases Abdomen: benign Extremities: no edema or calf tenderness Wound: incis healing well  Lab Results: Recent Labs    01/09/20 0242 01/11/20 0258  WBC 5.6 7.6  HGB 8.7* 9.2*  HCT 27.4* 28.6*  PLT 181 241   BMET:  Recent Labs    01/09/20 0242 01/11/20 0258  NA 137 135  K 4.2 4.4  CL 104 101  CO2 27 26  GLUCOSE 163* 173*  BUN 14 16  CREATININE 0.86 0.94  CALCIUM 8.3* 8.4*    PT/INR: No results for input(s): LABPROT, INR in the last 72 hours. ABG    Component Value Date/Time   PHART 7.380 01/02/2020 0618   HCO3 22.3 01/02/2020 0618   TCO2 23 01/02/2020 0618   ACIDBASEDEF 3.0 (H) 01/02/2020 0618   O2SAT 98.0 01/02/2020 0618   CBG (last 3)  Recent Labs    01/10/20 2340 01/11/20 0331 01/11/20 0808  GLUCAP 150* 194* 215*    Meds Scheduled Meds: . Chlorhexidine Gluconate Cloth  6 each Topical Daily  . docusate  100 mg Oral BID  . enoxaparin (LOVENOX) injection  30 mg  Subcutaneous Q24H  . feeding supplement (PROSource TF)  45 mL Per Tube BID  . insulin aspart  0-24 Units Subcutaneous Q4H  . pantoprazole sodium  40 mg Per Tube BID  . polyethylene glycol  17 g Oral Daily  . sodium chloride flush  10-40 mL Intracatheter Q12H   Continuous Infusions: . sodium chloride Stopped (01/05/20 1024)  . feeding supplement (OSMOLITE 1.5 CAL) 1,000 mL (01/11/20 0359)   PRN Meds:.oxyCODONE **AND** acetaminophen, diphenhydrAMINE **OR** diphenhydrAMINE, HYDROmorphone (DILAUDID) injection, HYDROmorphone (DILAUDID) injection, morphine injection, naloxone **AND** sodium chloride flush, ondansetron (ZOFRAN) IV, sodium chloride flush, white petrolatum  Xrays DG Chest 2 View  Result Date: 01/10/2020 CLINICAL DATA:  Status post bronchoscopy with shortness of breath and cough EXAM: CHEST - 2 VIEW COMPARISON:  01/05/2020 FINDINGS: Cardiac shadow is within normal limits. Gastric catheter and right jugular catheter have been removed in the interval. Linear density is again noted over the left neck likely extrinsic to the patient. Lungs are well aerated bilaterally. Small left pleural effusion and left basilar atelectasis is noted stable from the prior exam. IMPRESSION: Mild left basilar atelectasis and effusion. Electronically Signed   By: Inez Catalina M.D.   On: 01/10/2020 08:43    Assessment/Plan: S/P Procedure(s) (LRB): VIDEO BRONCHOSCOPY WITH BRONCHIAL WASHING FOR CULTURES AND GRAM STAIN (N/A) TRANSHIATAL TOTAL ESOPHAGECTOMY COMPLETE WITH  NIMS TUBE (N/A) JEJUNOSTOMY (N/A) CHEST TUBE INSERTION (Left)  1 doing well POD#10 2 increasing ly tolerating po intake 3 will change to ACHS CBG's, BS fair control, restart glucophage, crushed 4 H/H improving trend, no leukocytosis 5 tmax 99.8 , VSS 6 normal lytes/renal fxn 7 conts nightime TF's- cont family training 8 poss home Monday with [facility] arrangements  LOS: 10 days    [name] PA-C Pager [phone] 01/11/2020  Taking some  soft food , but "soft diet" supplied is poor so not eating much  Poss home Sunday or Monday- mat have to be Monday as no HH arranged untilk then and tube feeding and oump not delivered to patients home yet. I have seen and examined [name] and agree with the above assessment  and plan.  [name] MD Beeper [phone] Office [phone] 01/11/2020 11:02 AM

## 2020-01-11 NOTE — Progress Notes (Signed)
Patient requested that do every thing once and let him rest, because he couldn't rest well in the hospital. Explained patient what is next for and adjusted time as patient requested.That makes patient feel more comfortable. Taught patient's wife how to give medication throw J- tube. She accepted it well, she observed today. Will encourage wife to do hands on tomorrow. HS [employer]

## 2020-01-11 NOTE — Plan of Care (Signed)

## 2020-01-11 NOTE — Plan of Care (Signed)
  Problem: Education: Goal: Knowledge of General Education information will improve Description: Including pain rating scale, medication(s)/side effects and non-pharmacologic comfort measures Outcome: Progressing   Problem: Health Behavior/Discharge Planning: Goal: Ability to manage health-related needs will improve Outcome: Progressing   Problem: Clinical Measurements: Goal: Ability to maintain clinical measurements within normal limits will improve Outcome: Progressing Goal: Will remain free from infection Outcome: Progressing Goal: Diagnostic test results will improve Outcome: Progressing Goal: Respiratory complications will improve Outcome: Progressing Goal: Cardiovascular complication will be avoided Outcome: Progressing   Problem: Activity: Goal: Risk for activity intolerance will decrease Outcome: Progressing   Problem: Nutrition: Goal: Adequate nutrition will be maintained Outcome: Progressing   Problem: Coping: Goal: Level of anxiety will decrease Outcome: Progressing   Problem: Elimination: Goal: Will not experience complications related to bowel motility Outcome: Progressing Goal: Will not experience complications related to urinary retention Outcome: Progressing   Problem: Pain Managment: Goal: General experience of comfort will improve Outcome: Progressing   Problem: Safety: Goal: Ability to remain free from injury will improve Outcome: Progressing   Problem: Skin Integrity: Goal: Risk for impaired skin integrity will decrease Outcome: Progressing   Problem: Education: Goal: Knowledge of the prescribed therapeutic regimen will improve Outcome: Progressing   Problem: Bowel/Gastric: Goal: Gastrointestinal status for postoperative course will improve Outcome: Progressing   Problem: Nutritional: Goal: Ability to achieve adequate nutritional intake will improve Outcome: Progressing   Problem: Clinical Measurements: Goal: Postoperative  complications will be avoided or minimized Outcome: Progressing   Problem: Respiratory: Goal: Ability to maintain a clear airway will improve Outcome: Progressing   Problem: Skin Integrity: Goal: Demonstration of wound healing without infection will improve Outcome: Progressing   

## 2020-01-12 LAB — GLUCOSE, CAPILLARY
Glucose-Capillary: 233 mg/dL — ABNORMAL HIGH (ref 70–99)
Glucose-Capillary: 174 mg/dL — ABNORMAL HIGH (ref 70–99)
Glucose-Capillary: 170 mg/dL — ABNORMAL HIGH (ref 70–99)
Glucose-Capillary: 200 mg/dL — ABNORMAL HIGH (ref 70–99)

## 2020-01-12 LAB — BASIC METABOLIC PANEL
Anion gap: 9 (ref 5–15)
BUN: 16 mg/dL (ref 8–23)
CO2: 29 mmol/L (ref 22–32)
Calcium: 8.6 mg/dL — ABNORMAL LOW (ref 8.9–10.3)
Chloride: 98 mmol/L (ref 98–111)
Creatinine, Ser: 0.79 mg/dL (ref 0.61–1.24)
GFR calc Af Amer: >60 mL/min (ref >60)
GFR calc non Af Amer: >60 mL/min (ref >60)
Glucose, Bld: 144 mg/dL — ABNORMAL HIGH (ref 70–99)
Potassium: 4.5 mmol/L (ref 3.5–5.1)
Sodium: 136 mmol/L (ref 135–145)

## 2020-01-12 LAB — MAGNESIUM: Magnesium: 1.9 mg/dL (ref 1.7–2.4)

## 2020-01-12 NOTE — Plan of Care (Signed)
  Problem: Education: Goal: Knowledge of General Education information will improve Description: Including pain rating scale, medication(s)/side effects and non-pharmacologic comfort measures Outcome: Progressing   Problem: Health Behavior/Discharge Planning: Goal: Ability to manage health-related needs will improve Outcome: Progressing   Problem: Clinical Measurements: Goal: Ability to maintain clinical measurements within normal limits will improve Outcome: Progressing Goal: Will remain free from infection Outcome: Progressing Goal: Diagnostic test results will improve Outcome: Progressing Goal: Respiratory complications will improve Outcome: Progressing Goal: Cardiovascular complication will be avoided Outcome: Progressing   Problem: Activity: Goal: Risk for activity intolerance will decrease Outcome: Progressing   Problem: Nutrition: Goal: Adequate nutrition will be maintained Outcome: Progressing   Problem: Coping: Goal: Level of anxiety will decrease Outcome: Progressing   Problem: Elimination: Goal: Will not experience complications related to bowel motility Outcome: Progressing Goal: Will not experience complications related to urinary retention Outcome: Progressing   Problem: Pain Managment: Goal: General experience of comfort will improve Outcome: Progressing   Problem: Safety: Goal: Ability to remain free from injury will improve Outcome: Progressing   Problem: Skin Integrity: Goal: Risk for impaired skin integrity will decrease Outcome: Progressing   Problem: Education: Goal: Knowledge of the prescribed therapeutic regimen will improve Outcome: Progressing   Problem: Bowel/Gastric: Goal: Gastrointestinal status for postoperative course will improve Outcome: Progressing   Problem: Nutritional: Goal: Ability to achieve adequate nutritional intake will improve Outcome: Progressing   Problem: Clinical Measurements: Goal: Postoperative  complications will be avoided or minimized Outcome: Progressing   Problem: Respiratory: Goal: Ability to maintain a clear airway will improve Outcome: Progressing   Problem: Skin Integrity: Goal: Demonstration of wound healing without infection will improve Outcome: Progressing   

## 2020-01-12 NOTE — Progress Notes (Addendum)
James Dunn [address]       [place] [address]             [phone]      11 Days Post-Op Procedure(s) (LRB): VIDEO BRONCHOSCOPY WITH BRONCHIAL WASHING FOR CULTURES AND GRAM STAIN (N/A) TRANSHIATAL TOTAL ESOPHAGECTOMY COMPLETE WITH NIMS TUBE (N/A) JEJUNOSTOMY (N/A) CHEST TUBE INSERTION (Left) Subjective: Feels ok, , some minor cough/ regurgitation with eating  Objective: Vital signs in last 24 hours: Temp:  [98.4 F (36.9 C)-99.9 F (37.7 C)] 98.7 F (37.1 C) (07/25 0837) Pulse Rate:  [77-94] 84 (07/25 0837) Cardiac Rhythm: Normal sinus rhythm;Bundle branch block (07/25 0721) Resp:  [12-22] 14 (07/25 0837) BP: (105-126)/(57-75) 126/71 (07/25 0837) SpO2:  [95 %-98 %] 98 % (07/25 0837)  Hemodynamic parameters for last 24 hours:    Intake/Output from previous day: 07/24 0701 - 07/25 0700 In: 711 [P.O.:50; I.V.:3; NG/GT:598] Out: -  Intake/Output this shift: Total I/O In: 3 [I.V.:3] Out: 375 [Urine:375]  General appearance: alert, cooperative and no distress Heart: regular rate and rhythm Lungs: clear to auscultation bilaterally Abdomen: benign Extremities: no edema or calf tenderness Wound: incis healing well  Lab Results: Recent Labs    01/11/20 0258  WBC 7.6  HGB 9.2*  HCT 28.6*  PLT 241   BMET:  Recent Labs    01/11/20 0258  NA 135  K 4.4  CL 101  CO2 26  GLUCOSE 173*  BUN 16  CREATININE 0.94  CALCIUM 8.4*    PT/INR: No results for input(s): LABPROT, INR in the last 72 hours. ABG    Component Value Date/Time   PHART 7.380 01/02/2020 0618   HCO3 22.3 01/02/2020 0618   TCO2 23 01/02/2020 0618   ACIDBASEDEF 3.0 (H) 01/02/2020 0618   O2SAT 98.0 01/02/2020 0618   CBG (last 3)  Recent Labs    01/11/20 1610 01/11/20 2100 01/12/20 0609  GLUCAP 161* 200* 233*    Meds Scheduled Meds: . Chlorhexidine Gluconate Cloth  6 each Topical Daily  . docusate  100 mg Oral BID  . enoxaparin (LOVENOX) injection  30 mg Subcutaneous  Q24H  . feeding supplement (PROSource TF)  45 mL Per Tube BID  . insulin aspart  0-24 Units Subcutaneous TID AC & HS  . metFORMIN  1,000 mg Oral Q breakfast  . pantoprazole sodium  40 mg Per Tube BID  . polyethylene glycol  17 g Oral Daily  . sodium chloride flush  10-40 mL Intracatheter Q12H   Continuous Infusions: . sodium chloride Stopped (01/05/20 1024)  . feeding supplement (OSMOLITE 1.5 CAL) 1,000 mL (01/11/20 2110)   PRN Meds:.oxyCODONE **AND** acetaminophen, diphenhydrAMINE **OR** diphenhydrAMINE, HYDROmorphone (DILAUDID) injection, HYDROmorphone (DILAUDID) injection, morphine injection, naloxone **AND** sodium chloride flush, ondansetron (ZOFRAN) IV, sodium chloride flush, white petrolatum  Xrays No results found.  Assessment/Plan: S/P Procedure(s) (LRB): VIDEO BRONCHOSCOPY WITH BRONCHIAL WASHING FOR CULTURES AND GRAM STAIN (N/A) TRANSHIATAL TOTAL ESOPHAGECTOMY COMPLETE WITH NIMS TUBE (N/A) JEJUNOSTOMY (N/A) CHEST TUBE INSERTION (Left)  1 tmax 99.7,VSS 2 sats good on RA 3 no new labs, BS elevated- glucophage restarted  4 no new labs 5 home tomorrow when [facility] equipt delivered  LOS: 11 days    [name] PA-C  pager [phone] 01/12/2020  Neck drain out yesterday, no drainage today Plan home tomorrow on night tube feeding and soft diet in day I have seen and examined James Dunn and agree with the above assessment  and plan.  Percell Miller  Maryruth Bun MD [address] [phone] Office [phone] 01/12/2020 11:21 AM

## 2020-01-13 DIAGNOSIS — J4520 Mild intermittent asthma, uncomplicated: Secondary | ICD-10-CM | POA: Diagnosis not present

## 2020-01-13 DIAGNOSIS — Z86010 Personal history of colonic polyps: Secondary | ICD-10-CM | POA: Diagnosis not present

## 2020-01-13 DIAGNOSIS — Z7984 Long term (current) use of oral hypoglycemic drugs: Secondary | ICD-10-CM | POA: Diagnosis not present

## 2020-01-13 DIAGNOSIS — Z85038 Personal history of other malignant neoplasm of large intestine: Secondary | ICD-10-CM | POA: Diagnosis not present

## 2020-01-13 DIAGNOSIS — I700 Atherosclerosis of aorta: Secondary | ICD-10-CM | POA: Diagnosis not present

## 2020-01-13 DIAGNOSIS — K219 Gastro-esophageal reflux disease without esophagitis: Secondary | ICD-10-CM | POA: Diagnosis not present

## 2020-01-13 DIAGNOSIS — K5790 Diverticulosis of intestine, part unspecified, without perforation or abscess without bleeding: Secondary | ICD-10-CM | POA: Diagnosis not present

## 2020-01-13 DIAGNOSIS — N529 Male erectile dysfunction, unspecified: Secondary | ICD-10-CM | POA: Diagnosis not present

## 2020-01-13 DIAGNOSIS — J439 Emphysema, unspecified: Secondary | ICD-10-CM | POA: Diagnosis not present

## 2020-01-13 DIAGNOSIS — I2510 Atherosclerotic heart disease of native coronary artery without angina pectoris: Secondary | ICD-10-CM | POA: Diagnosis not present

## 2020-01-13 DIAGNOSIS — E1169 Type 2 diabetes mellitus with other specified complication: Secondary | ICD-10-CM | POA: Diagnosis not present

## 2020-01-13 DIAGNOSIS — Z9181 History of falling: Secondary | ICD-10-CM | POA: Diagnosis not present

## 2020-01-13 DIAGNOSIS — Z87891 Personal history of nicotine dependence: Secondary | ICD-10-CM | POA: Diagnosis not present

## 2020-01-13 DIAGNOSIS — E785 Hyperlipidemia, unspecified: Secondary | ICD-10-CM | POA: Diagnosis not present

## 2020-01-13 DIAGNOSIS — I10 Essential (primary) hypertension: Secondary | ICD-10-CM | POA: Diagnosis not present

## 2020-01-13 DIAGNOSIS — Z483 Aftercare following surgery for neoplasm: Secondary | ICD-10-CM | POA: Diagnosis not present

## 2020-01-13 DIAGNOSIS — C155 Malignant neoplasm of lower third of esophagus: Secondary | ICD-10-CM | POA: Diagnosis not present

## 2020-01-13 LAB — GLUCOSE, CAPILLARY: Glucose-Capillary: 206 mg/dL — ABNORMAL HIGH (ref 70–99)

## 2020-01-13 MED ORDER — OXYCODONE HCL 5 MG/5ML PO SOLN: 5.0000 mg | Freq: Four times a day (QID) | ORAL | 0 refills | Status: AC | PRN

## 2020-01-13 MED ORDER — GLUCERNA SHAKE PO LIQD: 237.0000 mL | Freq: Three times a day (TID) | ORAL | 99 refills | Status: DC

## 2020-01-13 MED ORDER — GLUCERNA SHAKE PO LIQD: 237.0000 mL | Freq: Three times a day (TID) | ORAL | Status: DC

## 2020-01-13 MED ORDER — ACETAMINOPHEN 160 MG/5ML PO SOLN: 500.0000 mg | Freq: Four times a day (QID) | ORAL | 0 refills | Status: DC | PRN

## 2020-01-13 MED ORDER — OSMOLITE 1.5 CAL PO LIQD
1610.0000 mL | ORAL | Status: DC
  Filled 2020-01-13: qty 2000

## 2020-01-13 MED ORDER — OSMOLITE 1.5 CAL PO LIQD
1610.0000 mL | ORAL | Status: DC
  Filled 2020-01-13 (×2): qty 2000

## 2020-01-13 MED ORDER — OSMOLITE 1.5 CAL PO LIQD: 1610.0000 mL | ORAL | 99 refills | Status: DC

## 2020-01-13 NOTE — Plan of Care (Signed)
  Problem: Education: Goal: Knowledge of General Education information will improve Description: Including pain rating scale, medication(s)/side effects and non-pharmacologic comfort measures Outcome: Progressing   Problem: Health Behavior/Discharge Planning: Goal: Ability to manage health-related needs will improve Outcome: Progressing   Problem: Clinical Measurements: Goal: Ability to maintain clinical measurements within normal limits will improve Outcome: Progressing Goal: Will remain free from infection Outcome: Progressing Goal: Diagnostic test results will improve Outcome: Progressing Goal: Respiratory complications will improve Outcome: Progressing Goal: Cardiovascular complication will be avoided Outcome: Progressing   Problem: Activity: Goal: Risk for activity intolerance will decrease Outcome: Progressing   Problem: Nutrition: Goal: Adequate nutrition will be maintained Outcome: Progressing   Problem: Coping: Goal: Level of anxiety will decrease Outcome: Progressing   Problem: Elimination: Goal: Will not experience complications related to bowel motility Outcome: Progressing Goal: Will not experience complications related to urinary retention Outcome: Progressing   Problem: Pain Managment: Goal: General experience of comfort will improve Outcome: Progressing   Problem: Safety: Goal: Ability to remain free from injury will improve Outcome: Progressing   Problem: Skin Integrity: Goal: Risk for impaired skin integrity will decrease Outcome: Progressing   Problem: Education: Goal: Knowledge of the prescribed therapeutic regimen will improve Outcome: Progressing   Problem: Bowel/Gastric: Goal: Gastrointestinal status for postoperative course will improve Outcome: Progressing   Problem: Nutritional: Goal: Ability to achieve adequate nutritional intake will improve Outcome: Progressing   Problem: Clinical Measurements: Goal: Postoperative  complications will be avoided or minimized Outcome: Progressing   Problem: Respiratory: Goal: Ability to maintain a clear airway will improve Outcome: Progressing   Problem: Skin Integrity: Goal: Demonstration of wound healing without infection will improve Outcome: Progressing   

## 2020-01-13 NOTE — Progress Notes (Addendum)
      [address]       [place] [address]             [phone]        12 Days Post-Op Procedure(s) (LRB): VIDEO BRONCHOSCOPY WITH BRONCHIAL WASHING FOR CULTURES AND GRAM STAIN (N/A) TRANSHIATAL TOTAL ESOPHAGECTOMY COMPLETE WITH NIMS TUBE (N/A) JEJUNOSTOMY (N/A) CHEST TUBE INSERTION (Left) Subjective: Sitting up in bed.  Says he is making slow progress with soft diet but is still having some cough after swallowing.   Objective: Vital signs in last 24 hours: Temp:  [98.7 F (37.1 C)-99 F (37.2 C)] 99 F (37.2 C) (07/25 2300) Pulse Rate:  [71-84] 84 (07/25 2300) Cardiac Rhythm: Normal sinus rhythm (07/26 0710) Resp:  [14-21] 20 (07/25 2300) BP: (96-126)/(44-71) 118/65 (07/25 2300) SpO2:  [97 %-100 %] 99 % (07/25 2300)  Hemodynamic parameters for last 24 hours:    Intake/Output from previous day: 07/25 0701 - 07/26 0700 In: 6195 [P.O.:100; I.V.:3; NG/GT:1300] Out: 375 [Urine:375] Intake/Output this shift: No intake/output data recorded.    General appearance:alert, cooperative andnodistress Neurologic:intact Heart:regular rate and rhythm Lungs:Breath sounds are clear. Abdomen:Soft, NT Extremities:No edema or tenderness Wound:The abdominal incision is c/d/i.The left neck dressing is dry. (Neck drain removed 7/2.)    Lab Results: Recent Labs    01/11/20 0258  WBC 7.6  HGB 9.2*  HCT 28.6*  PLT 241   BMET:  Recent Labs    01/11/20 0258 01/12/20 1545  NA 135 136  K 4.4 4.5  CL 101 98  CO2 26 29  GLUCOSE 173* 144*  BUN 16 16  CREATININE 0.94 0.79  CALCIUM 8.4* 8.6*    PT/INR: No results for input(s): LABPROT, INR in the last 72 hours. ABG    Component Value Date/Time   PHART 7.380 01/02/2020 0618   HCO3 22.3 01/02/2020 0618   TCO2 23 01/02/2020 0618   ACIDBASEDEF 3.0 (H) 01/02/2020 0618   O2SAT 98.0 01/02/2020 0618   CBG (last 3)  Recent Labs    01/12/20 1651 01/12/20 2112 01/13/20 0602  GLUCAP 170*  200* 206*    Assessment/Plan: S/P Procedure(s) (LRB): VIDEO BRONCHOSCOPY WITH BRONCHIAL WASHING FOR CULTURES AND GRAM STAIN (N/A) TRANSHIATAL TOTAL ESOPHAGECTOMY COMPLETE WITH NIMS TUBE (N/A) JEJUNOSTOMY (N/A) CHEST TUBE INSERTION (Left)  -POD12transhiatal esophagectomy for adenocarcinoma. Progressing well . Tolerating TF at [facility] 49ml/hr.  Plan discharge to home today with nocturnal TF and soft diet during the day.  -Type 2 DM-Glucophage resumed. Glucose 170-200 past 24 hours.   -DVT PPX- continue to encourage ambulation. Continue enoxaparin SQ daily.  -Disposition- Will discharge to home with nocturnal TF after we are sure supplies and feeding pump have been delivered.   Antony Odea, PA-C [phone] 01/13/2020  Incisions well healed No drainage from neck Taking po diet but slowly Plan home on night tube feeding today  I have seen and examined James Dunn and agree with the above assessment  and plan.  [name] MD Beeper [phone] Office [phone] 01/13/2020 9:16 AM

## 2020-01-13 NOTE — Discharge Instructions (Signed)
Esophageal Surgery   Patient and Family Education YOUR POST-ESOPHAGECTOMY CHECKLIST    Dear Patient and Family Member(s): The purpose of this handout  is to help answer your questions about esophageal surgery.   Please take the time to look through this information and share with your family.   We suggest you write down any questions you have to ask your doctor and healthcare team.  This booklet also contains information from your surgeon's office, including general information, insurance information, and directions to the office.  It is important that you or your family keep this  The nurses and doctors will refer to this information, especially as you prepare to go home!  Please ask the staff any questions you may have!   YOUR POST-ESOPHAGECTOMY CHECKLIST The purpose of this checklist is to make sure you will be ready to go home after surgery and have some items arranged BEFORE go home , so you have a smooth discharge from the hospital after your surgery.  o Quit smoking and avoid alcohol  o Do you have your home medicine list including pain medicine and understand it and are the prescriptions signed before you go home?  o Do you understand your diet instructions?   o Do you understand how to care for your Feeding tube, flushing and starting tube feeding from pump ?  o Do NOT take any non-steroidal anti-inflammatory drugs (for example, Motrin, ibuprofen, Aleve)  o Arrange for someone to be at home with you for 24/7 for the first week after discharge from the hospital. This person needs to be able to help you get in and out of bed, prepare food, change dressings, and start tube feedings if needed.  o Get a 12-inch wedge or 12-inch bed lifts to raise the head of the bed after you go home. Sleeping with your head elevated will help prevent reflux.   o Have recommended foods at home for when you are discharged.   DIET INFORMATION Important points to keep in mind You should  eat 6-8 small meals each day.  Large meals will not be well tolerated. Avoid very hot or cold beverages and spicy foods.  Protein supplements, high-energy foods, or a soft diet may be ordered.  Drinking fluids between meals rather than with meals may also be helpful.    A clinical dietician will be involved in your care, who will help in planning your meals.  You should sit upright, chew slowly, and eat more than 3 hours before bedtime to reduce reflux.   1.   Your body needs added calories and protein to help heal itself. Start slowly and gradually eat more as you are able. 2.   Eat small, frequent meals at least six times per day. 3.   Everyone tolerates foods differently.  Avoid those foods known to cause you problems. 4.   Keep high-calorie snacks handy, such as cheese, peanut butter, and yogurt. 5.   Increase your protein in foods by adding shredded cheese, dry milk powder, or peanut butter. 6.   Drink only nutritious beverages. Try unsweetened juice, Ensure, Boost or Carnation Instant Breakfast instead of coffee, tea, soda, or water. 7.   Do not lay down immediately after eating. Stay upright for at least 2 hours.  Dumping Syndrome When food or fluids move too quickly through your digestive system, it is called "dumping syndrome." Symptoms of dumping syndrome include nausea, dizziness or light-headedness, weakness and fatigue, rapid pulse, abdominal cramping, and diarrhea. It is essential to  tell your doctor if you have any of these symptoms.  Here are some tips to help avoid dumping syndrome: 1.  Don't drink liquids with your meals.  Wait 30 minutes to 1 hour after eating solid food to drink something. 2.  Limit sweets. Use sugar-free foods and drinks in place of regular sweet foods or drinks.   3.  Lactose (milk sugar) may also cause diarrhea and cramping. Drink lactose-free or lactose-reduced milk, or take lactase enzyme tablets (like Dairy Ease) when you eat dairy products. 4.   Adding extra fats (like butter, margarine, cheese, gravy, cream, or sour cream) to foods may help slow down the movement of food through your system. 5.  Avoid extremely hot or cold foods. 6.  Avoid carbonated drinks and alcohol. 7.  Eat slowly and chew your food carefully.  DISCHARGE INSTRUCTIONS . Take a few minutes each day to inspect your surgical incision for any signs or symptoms of infection or other complications (increased pain, swelling, fever, drainage, saliva leaking at the incision site). Report any problems to your surgeon immediately. See your doctor right away if you experience any difficulty swallowing. . If you smoked, do not go back to smoking!! Do not be in a room or a car with someone else who is smoking. Smoking and tobacco use can make you not heal as well. [facility] offers FREE smoking cessation classes that can help you quit smoking. For information on classes, and to register, call [phone]. Marland Kitchen Shower every day. Wash the incision gently with a mild unscented soap. Use a clean washcloth each time you wash. Pat your incision dry. Do not use lotions, creams, ointments, powders, or home remedies on or near your incisions. Your incision may be numb or sore for several weeks or months after your surgery. . Avoid heavy activity or anything that tenses your body for 12 weeks after surgery. You may resume your daily activities, work, and sexual relations as soon as you feel able to do so. No driving for the first 3 weeks after going home. . Weigh yourself several times a week. Report any significant weight changes to your doctor (10 pounds in 2 weeks). . Try not to take pain relievers longer than 4 to 7 days. Talk with your doctor if you continue to have pain that requires pain medication after a few days.  . To prevent constipation, take stool softeners at least as long as you take pain medication. If you are sent home with antibiotics, please take all of them even if you  feel fine. . Call your doctor if any of the following occur: fever of 101 degrees or higher, increased pain, swelling, redness, draining, or bleeding around your incision; vomiting; excessive weakness; tarry (black) stools; new, unexplained symptoms (they may be adverse reactions to drugs used in treatment); progressive weight loss; or continuous diarrhea. . Call the Surgeons office immediately if your feeding tube becomes clogged or falls out. Marland Kitchen Keep follow-up appointments so that your physician can check on your progress and condition.  Bring your medicine you are currently taking with you to your appointment. . [facility], survival support groups, social workers, chaplains, counselors, and smoking cessation programs may be helpful.      [address]        [place], [place]  [id number]  At [facility] we are dedicated to serving our patients.  We are committed to caring for you in  the most competent and courteous manner possible.  We hope that the following information will be helpful.  If you have any questions, please do not hesitate to ask.  OFFICE HOURS  Our office hours are 9:00AM to 5:00PM, Monday through Friday.  Our patients are seen by appointment only. If you are having any problems, we encourage you to call during office hours. In case of an emergency, one of our surgeons is on call at all times. If the office is closed, call the main switchboard and hold until the answering service responds. Your call will be returned as soon as possible.  Telephone Numbers Main Number:  [phone] For Appointments: [phone] Fax Number: [phone]

## 2020-01-13 NOTE — Progress Notes (Signed)
Nutrition Follow-up  DOCUMENTATION CODES:   Non-severe (moderate) malnutrition in context of chronic illness  INTERVENTION:   Transition to nocturnal tube feeds via J-tube: - Osmolite 1.5 @ 115 ml/hr x 14 hours from 1800 to 0800 (total of 1610 ml)  Nocturnal tube feeding regimen provides 2415 kcal, 101 grams of protein, and 1227 ml of H2O (100% of kcal needs, 92% of protein needs).  - Glucerna Shake po TID, each supplement provides 220 kcal and 10 grams of protein  NUTRITION DIAGNOSIS:   Moderate Malnutrition related to chronic illness as evidenced by mild fat depletion, mild muscle depletion.  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 Pt started on weekly chemo and daily radiation therapy on 10/02/19. Last day of chemo was 5/17, last radiation therapy was 5/19. PMH includes long term tobacco use (stoped 03/2019), DM  7/14- s/p transhiatal total esophagectomy, pyloroplasty, J-tube,chest tube 7/19 - NGT removed 7/20 - s/p gastrografin swallow showing no leak, diet advanced to clear liquids 7/22 - diet advanced to nectar-thick bariatric full liquid 7/23 - diet advanced to dysphagia 2  Plan is for pt to d/c home today on a soft diet and with nocturnal tube feeds. Discussed with [facility] and orders placed for nocturnal tube feeds. Discussed with RN.  No new weights since 01/05/20.  Meal Completion: 0-30% x 3 documented meals  Medications reviewed and include: colace, SSI QID, Metformin, protonix, miralax  Labs reviewed: hemoglobin 9.2 CBG's:  170-206 x 24 hours  Diet Order:   Diet Order            DIET DYS 2 Room service appropriate? Yes; Fluid  consistency: Thin  Diet effective now                 EDUCATION NEEDS:   Education needs have been addressed  Skin:  Skin Assessment: Skin Integrity Issues: Incisions: abdomen, chest  Last BM:  01/12/20  Height:   Ht Readings from Last 1 Encounters:  01/01/20 5' 7"  (1.702 m)    Weight:   Wt Readings from Last 1 Encounters:  01/05/20 73.4 kg    BMI:  Body mass index is 25.34 kg/m.  Estimated Nutritional Needs:   Kcal:  2200-2500 kcals  Protein:  110-130 grams  Fluid:  >/= 2 L    Gaynell Face, MS, RD, LDN Inpatient Clinical Dietitian Please see [facility] for contact information.

## 2020-01-13 NOTE — Progress Notes (Signed)
Discharge instructions appointments and medications extensively reviewed with patient and spouse.  Both were able to ask questions and both verbalized understanding.  Written instructions given with highlighted important days and directions.  Patient via wheelchair to waiting car in stable condition with wife.  Nurse to be at the home at 3 pm and equipment to be delivered by 3 pm.

## 2020-01-14 ENCOUNTER — Telehealth: Payer: Self-pay | Admitting: Family Medicine

## 2020-01-14 NOTE — Telephone Encounter (Signed)
ok 

## 2020-01-14 NOTE — Telephone Encounter (Signed)
Annette from [facility] called and needs orders for Nursing to see patient 2 times a  week for 1 week then 1 time a week for 2 weeks also needs order for speech therapy evaluation.  Please call her at [phone]

## 2020-01-14 NOTE — Telephone Encounter (Signed)
[facility] was advised.[place]

## 2020-01-15 ENCOUNTER — Telehealth: Payer: Self-pay

## 2020-01-15 NOTE — Telephone Encounter (Signed)
Pt's wife calls to report redness and drainage around pt's J-tube site. He is s/p esophagectomy by Dr. Servando Snare on 01/01/20. Reports a greenish, crusty drainage around site. Pt is afebrile and nightly tube feedings are reported to be going well. Photos sent per request and evaluated by Dr. Servando Snare. He orders for diluted hydrogen peroxide to be applied to area twice daily. Wife instructed to apply 1:1 hydrogen peroxide/water solution around J-tube site twice daily and bandage as usual. Instructed to report if worsens or pt develops fever and/or other problems/concerns.

## 2020-01-16 DIAGNOSIS — I10 Essential (primary) hypertension: Secondary | ICD-10-CM | POA: Diagnosis not present

## 2020-01-16 DIAGNOSIS — Z9181 History of falling: Secondary | ICD-10-CM | POA: Diagnosis not present

## 2020-01-16 DIAGNOSIS — C155 Malignant neoplasm of lower third of esophagus: Secondary | ICD-10-CM | POA: Diagnosis not present

## 2020-01-16 DIAGNOSIS — Z85038 Personal history of other malignant neoplasm of large intestine: Secondary | ICD-10-CM | POA: Diagnosis not present

## 2020-01-16 DIAGNOSIS — E785 Hyperlipidemia, unspecified: Secondary | ICD-10-CM | POA: Diagnosis not present

## 2020-01-16 DIAGNOSIS — Z483 Aftercare following surgery for neoplasm: Secondary | ICD-10-CM | POA: Diagnosis not present

## 2020-01-16 DIAGNOSIS — E1169 Type 2 diabetes mellitus with other specified complication: Secondary | ICD-10-CM | POA: Diagnosis not present

## 2020-01-16 DIAGNOSIS — N529 Male erectile dysfunction, unspecified: Secondary | ICD-10-CM | POA: Diagnosis not present

## 2020-01-16 DIAGNOSIS — Z87891 Personal history of nicotine dependence: Secondary | ICD-10-CM | POA: Diagnosis not present

## 2020-01-16 DIAGNOSIS — K219 Gastro-esophageal reflux disease without esophagitis: Secondary | ICD-10-CM | POA: Diagnosis not present

## 2020-01-16 DIAGNOSIS — J439 Emphysema, unspecified: Secondary | ICD-10-CM | POA: Diagnosis not present

## 2020-01-16 DIAGNOSIS — I2510 Atherosclerotic heart disease of native coronary artery without angina pectoris: Secondary | ICD-10-CM | POA: Diagnosis not present

## 2020-01-16 DIAGNOSIS — Z7984 Long term (current) use of oral hypoglycemic drugs: Secondary | ICD-10-CM | POA: Diagnosis not present

## 2020-01-16 DIAGNOSIS — J4520 Mild intermittent asthma, uncomplicated: Secondary | ICD-10-CM | POA: Diagnosis not present

## 2020-01-16 DIAGNOSIS — K5790 Diverticulosis of intestine, part unspecified, without perforation or abscess without bleeding: Secondary | ICD-10-CM | POA: Diagnosis not present

## 2020-01-16 DIAGNOSIS — I700 Atherosclerosis of aorta: Secondary | ICD-10-CM | POA: Diagnosis not present

## 2020-01-16 DIAGNOSIS — Z86010 Personal history of colonic polyps: Secondary | ICD-10-CM | POA: Diagnosis not present

## 2020-01-17 ENCOUNTER — Telehealth: Payer: Self-pay

## 2020-01-17 NOTE — Telephone Encounter (Signed)
-----   Message from [name], MD sent at 01/17/2020 12:54 PM EDT ----- Regarding: RE: Eating raw foods? Tell yes he can eat cooked eggs that is good , NO raw sushi ----- Message ----- From: Marylen Ponto, LPN Sent: 8/72/7618  12:39 PM EDT To: [name], MD, # Subject: Eating raw foods?                              Patient called about getting an "OK" to eat raw food. He states that he was told not to eat raw foods, i.e.- eggs, sushi.... He states that the speech therapist wants him to start eating eggs... He is requesting your approval if you agree with the speech therapy

## 2020-01-20 ENCOUNTER — Ambulatory Visit: Payer: PPO | Admitting: Hematology

## 2020-01-20 ENCOUNTER — Other Ambulatory Visit: Payer: PPO

## 2020-01-24 ENCOUNTER — Other Ambulatory Visit: Payer: Self-pay

## 2020-01-24 DIAGNOSIS — G8918 Other acute postprocedural pain: Secondary | ICD-10-CM

## 2020-01-24 MED ORDER — TRAMADOL HCL 50 MG PO TABS: 50.0000 mg | ORAL_TABLET | Freq: Four times a day (QID) | ORAL | 0 refills | Status: DC | PRN

## 2020-01-27 ENCOUNTER — Other Ambulatory Visit: Payer: Self-pay | Admitting: Radiation Oncology

## 2020-01-27 ENCOUNTER — Other Ambulatory Visit: Payer: Self-pay

## 2020-01-27 ENCOUNTER — Other Ambulatory Visit: Payer: Self-pay | Admitting: Family Medicine

## 2020-01-27 DIAGNOSIS — E0865 Diabetes mellitus due to underlying condition with hyperglycemia: Secondary | ICD-10-CM

## 2020-01-27 NOTE — Telephone Encounter (Signed)
Pt confirmed the d/c of the lisinopril. [place]

## 2020-01-28 ENCOUNTER — Other Ambulatory Visit: Payer: Self-pay | Admitting: Cardiothoracic Surgery

## 2020-01-28 MED ORDER — OXYCODONE HCL 5 MG/5ML PO SOLN: 5.0000 mg | Freq: Four times a day (QID) | ORAL | 0 refills | Status: DC | PRN

## 2020-01-30 DIAGNOSIS — N529 Male erectile dysfunction, unspecified: Secondary | ICD-10-CM | POA: Diagnosis not present

## 2020-01-30 DIAGNOSIS — J439 Emphysema, unspecified: Secondary | ICD-10-CM | POA: Diagnosis not present

## 2020-01-30 DIAGNOSIS — C155 Malignant neoplasm of lower third of esophagus: Secondary | ICD-10-CM | POA: Diagnosis not present

## 2020-01-30 DIAGNOSIS — K219 Gastro-esophageal reflux disease without esophagitis: Secondary | ICD-10-CM | POA: Diagnosis not present

## 2020-01-30 DIAGNOSIS — Z483 Aftercare following surgery for neoplasm: Secondary | ICD-10-CM | POA: Diagnosis not present

## 2020-01-30 DIAGNOSIS — I700 Atherosclerosis of aorta: Secondary | ICD-10-CM | POA: Diagnosis not present

## 2020-01-30 DIAGNOSIS — Z86010 Personal history of colonic polyps: Secondary | ICD-10-CM | POA: Diagnosis not present

## 2020-01-30 DIAGNOSIS — E1169 Type 2 diabetes mellitus with other specified complication: Secondary | ICD-10-CM | POA: Diagnosis not present

## 2020-01-30 DIAGNOSIS — J4520 Mild intermittent asthma, uncomplicated: Secondary | ICD-10-CM | POA: Diagnosis not present

## 2020-01-30 DIAGNOSIS — E785 Hyperlipidemia, unspecified: Secondary | ICD-10-CM | POA: Diagnosis not present

## 2020-01-30 DIAGNOSIS — Z9181 History of falling: Secondary | ICD-10-CM | POA: Diagnosis not present

## 2020-01-30 DIAGNOSIS — I10 Essential (primary) hypertension: Secondary | ICD-10-CM | POA: Diagnosis not present

## 2020-01-30 DIAGNOSIS — Z87891 Personal history of nicotine dependence: Secondary | ICD-10-CM | POA: Diagnosis not present

## 2020-01-30 DIAGNOSIS — K5790 Diverticulosis of intestine, part unspecified, without perforation or abscess without bleeding: Secondary | ICD-10-CM | POA: Diagnosis not present

## 2020-01-30 DIAGNOSIS — Z85038 Personal history of other malignant neoplasm of large intestine: Secondary | ICD-10-CM | POA: Diagnosis not present

## 2020-01-30 DIAGNOSIS — I2510 Atherosclerotic heart disease of native coronary artery without angina pectoris: Secondary | ICD-10-CM | POA: Diagnosis not present

## 2020-01-30 DIAGNOSIS — Z7984 Long term (current) use of oral hypoglycemic drugs: Secondary | ICD-10-CM | POA: Diagnosis not present

## 2020-01-31 NOTE — Progress Notes (Signed)
James Dunn   [phone] [phone]   Clinic Follow up Note   Patient Care Team: Denita Lung, MD as PCP - General (Family Medicine) [name], MD as Consulting Physician (Gastroenterology) [name], MD as Consulting Physician (Hematology) Jonnie Finner, RN as Oncology Nurse Navigator Minus Breeding, MD as Consulting Physician (Cardiology)  Date of Service:  02/03/2020  CHIEF COMPLAINT: F/u ofEsophagus cancer  SUMMARY OF ONCOLOGIC HISTORY: Oncology History Overview Note  Cancer Staging Esophageal cancer [facility]) Staging form: Esophagus - Adenocarcinoma, AJCC 8th Edition - Clinical stage from 09/13/2019: Stage IVA (cT3, cN2, cM0) - Signed by [name], MD on 09/22/2019 - Pathologic stage from 01/07/2020: Stage I (ypT0, pN0, cM0, G2) - Signed by [name], MD on 01/07/2020    Esophageal cancer ([place])  08/30/2019 Procedure   EGD by Dr. [name] 08/30/19  IMPRESSION - Partially obstructing, malignant esophageal tumor was found in the lower third of the esophagus. Biopsied. Injected. - Normal stomach. - Normal examined duodenum.   08/30/2019 Initial Biopsy   FINAL MICROSCOPIC DIAGNOSIS:   A. ESOPHAGUS, BIOPSY:  - At least intramucosal adenocarcinoma.  - See comment.   COMMENT:   - The depth of invasion can not be determined due to the superficial  nature of the biopsy.  Dr.  Vic Ripper has reviewed the case and concurs  with this interpretation.  Additional studies can be performed upon  clinician request.    09/05/2019 Imaging   CT CAP W Contrast 09/05/19  IMPRESSION: 1. Soft tissue mass noted distal esophagus, compatible with known neoplasm. There is a small 6 mm short axis adjacent paraesophageal node, concerning for metastatic disease. 2. Upper normal to mildly enlarged hepatoduodenal ligament lymph node, with upper normal right para-aortic nodes. As metastatic disease cannot be excluded, PET-CT may prove helpful to  further evaluate. 3. 1.9 x 1.3 cm nodular collection of soft tissue posterior to the descending duodenum. This is in close proximity to the pancreatic head but a definite communication of parenchyma between these 2 structures is not discernible by CT. 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 Attention on follow-up recommended. 4. 6 mm perifissural nodule right middle lobe, likely a subpleural lymph node. Attention on follow-up recommended. 5. Hepatomegaly with hepatic steatosis. 6. Ascending thoracic aorta measures 4 cm diameter. Recommend annual imaging followup by CTA or MRA. This recommendation follows 2010 ACCF/AHA/AATS/ACR/ASA/SCA/SCAI/SIR/STS/SVM Guidelines for the Diagnosis and Management of Patients with Thoracic Aortic Disease. Circulation. 2010; 121: W389-H734. Aortic aneurysm NOS (ICD10-I71.9) 7. Left-sided IVC, normal variant.   09/11/2019 Tumor Marker   Baseline CEA 25.12 on 09/11/19   09/13/2019 Cancer Staging   Staging form: Esophagus - Adenocarcinoma, AJCC 8th Edition - Clinical stage from 09/13/2019: Stage IVA (cT3, cN2, cM0) - Signed by [name], MD on 09/22/2019   09/13/2019 Procedure   EUS by Dr. [name] 09/13/19  IMPRESSION - A mass was found in the lower third of the esophagus. A tissue diagnosis was obtained prior to this exam. This is of adenocarcinoma. This was staged T3 N3 Mx by endosonographic criteria. - No specimens collected.   09/20/2019 PET scan   PET IMPRESSION: 1. Hypermetabolic distal esophageal primary. 2. Low-level hypermetabolism within upper abdominal nodes. These are technically nonspecific, but favored to be reactive in the setting of hepatic steatosis and hepatomegaly. 3. Otherwise, no evidence of metastatic disease. 4. Anal hypermetabolism could be physiologic. Consider correlation with physical exam and colonoscopy, if patient is not up-to-date. 5. Incidental findings,  including: Aortic  atherosclerosis (ICD10-I70.0), coronary artery atherosclerosis and emphysema (ICD10-J43.9). Sinus disease and bilateral mastoid effusions.   09/22/2019 Initial Diagnosis   Esophageal cancer ([place])   09/30/2019 - 11/04/2019 Chemotherapy   concurrent ChemoRT with weekly Carboplatin and Taxol starting 09/30/19. Last dose was Taxol alone given neutropenia.    09/30/2019 - 11/06/2019 Radiation Therapy   concurrent ChemoRT with Dr. Lisbeth Renshaw starting 09/30/19-11/06/19   12/05/2019 Imaging   CT CAP w Contrast  IMPRESSION: 1. Interval response to therapy. Approximately 50% reduction in size of distal esophageal mass 2. No new or progressive disease identified within the chest or abdomen. 3. Emphysema and aortic atherosclerosis. 4. Left main and 3 vessel coronary artery calcifications.   Aortic Atherosclerosis (ICD10-I70.0) and Emphysema (ICD10-J43.9).     01/01/2020 Surgery   VIDEO BRONCHOSCOPY WITH BRONCHIAL WASHING FOR CULTURES AND GRAM STAIN and TRANSHIATAL TOTAL ESOPHAGECTOMY COMPLETE WITH NIMS TUBE and JEJUNOSTOMY and CHEST TUBE INSERTION with Dr Servando Snare.    01/01/2020 Pathology Results   FINAL MICROSCOPIC DIAGNOSIS:   A. ESOPHAGUS AND PARTIAL STOMACH, ESOPHAGECTOMY:  - No residual carcinoma identified.  - Reactive changes, mixed inflammation, and ulcer.  - Sixteen of sixteen lymph nodes negative for carcinoma (0/16).  - See oncology table.    01/07/2020 Cancer Staging   Staging form: Esophagus - Adenocarcinoma, AJCC 8th Edition - Pathologic stage from 01/07/2020: Stage I (ypT0, pN0, cM0, G2) - Signed by [name], MD on 01/07/2020      CURRENT THERAPY:  Surveillance   INTERVAL HISTORY:  James Dunn is here for a follow up after surgery. He presents to the clinic with his wife. He notes he is still recovering from surgery. He is currently tube feeding at night for now 10 hours. With tube feeding he did have hyperglycemia. He is currently only on Metformin. He notes he is eating  by mouth with soups, eggs, mostly soft and liquid diet. He notes having trouble sleeping on his wedge that props his neck up. He has been coughing up thick phlegm since surgery that he will spit up if needed.     REVIEW OF SYSTEMS:   Constitutional: Denies fevers, chills or abnormal weight loss (+) Trouble sleeping  Eyes: Denies blurriness of vision Ears, nose, mouth, throat, and face: Denies mucositis or sore throat (+) Dysphagia and Odynophagia  Respiratory: Denies dyspnea or wheezes (+) Cough Cardiovascular: Denies palpitation, chest discomfort or lower extremity swelling Gastrointestinal:  Denies nausea, heartburn or change in bowel habits Skin: Denies abnormal skin rashes MSK: (+) Neck and shoulder pain  Lymphatics: Denies new lymphadenopathy or easy bruising Neurological:Denies numbness, tingling or new weaknesses Behavioral/Psych: Mood is stable, no new changes  All other systems were reviewed with the patient and are negative.  MEDICAL HISTORY:  Past Medical History:  Diagnosis Date  . Allergy   . Asthma   . Controlled type 2 diabetes mellitus without complication, without long-term current use of insulin ([place]) 06/25/2012  . Diverticulosis   . ED (erectile dysfunction) 12/28/2010  . Esophageal cancer ([place]) 09/22/2019  . Former smoker 06/25/2012  . GERD (gastroesophageal reflux disease) 2002  . History of colonic polyps 05/11/2016  . Hyperlipidemia associated with type 2 diabetes mellitus ([place]) 05/11/2016  . Hypertension associated with diabetes ([place]) 08/02/2017  . Mild intermittent asthma without complication 0/96/2836  . Seasonal allergic rhinitis due to pollen 11/29/2016  . Tubular adenoma of colon 12/05/2016    SURGICAL HISTORY: Past Surgical History:  Procedure Laterality Date  . BIOPSY  08/30/2019  Procedure: BIOPSY;  Surgeon: [name], MD;  Location: [facility];  Service: Endoscopy;;  . CHEST TUBE INSERTION Left 01/01/2020   Procedure: CHEST TUBE INSERTION;   Surgeon: [name], MD;  Location: [place];  Service: Thoracic;  Laterality: Left;  . COMPLETE ESOPHAGECTOMY N/A 01/01/2020   Procedure: TRANSHIATAL TOTAL ESOPHAGECTOMY COMPLETE WITH NIMS TUBE;  Surgeon: [name], MD;  Location: [facility];  Service: Thoracic;  Laterality: N/A;  . ESOPHAGOGASTRODUODENOSCOPY (EGD) WITH PROPOFOL N/A 08/30/2019   Procedure: ESOPHAGOGASTRODUODENOSCOPY (EGD) WITH PROPOFOL;  Surgeon: [name], MD;  Location: [facility];  Service: Endoscopy;  Laterality: N/A;  . ESOPHAGOGASTRODUODENOSCOPY (EGD) WITH PROPOFOL N/A 09/13/2019   Procedure: ESOPHAGOGASTRODUODENOSCOPY (EGD) WITH PROPOFOL;  Surgeon: [name], MD;  Location: [facility];  Service: Endoscopy;  Laterality: N/A;  . JEJUNOSTOMY N/A 01/01/2020   Procedure: [name];  Surgeon: [name], MD;  Location: [place];  Service: Thoracic;  Laterality: N/A;  . SKIN BIOPSY N/A 03/04/2019   upper back Nerofibroma and follicular cyst   . SUBMUCOSAL INJECTION  08/30/2019   Procedure: SUBMUCOSAL INJECTION;  Surgeon: [name], MD;  Location: [facility];  Service: Endoscopy;;  . UPPER ESOPHAGEAL ENDOSCOPIC ULTRASOUND (EUS) N/A 09/13/2019   Procedure: UPPER ESOPHAGEAL ENDOSCOPIC ULTRASOUND (EUS);  Surgeon: [name], MD;  Location: [facility];  Service: Endoscopy;  Laterality: N/A;  . VIDEO BRONCHOSCOPY N/A 01/01/2020   Procedure: VIDEO BRONCHOSCOPY WITH BRONCHIAL WASHING FOR CULTURES AND GRAM STAIN;  Surgeon: [name], MD;  Location: [place];  Service: Thoracic;  Laterality: N/A;    I have reviewed the social history and family history with the patient and they are unchanged from previous note.  ALLERGIES:  has No Known Allergies.  MEDICATIONS:  Current Outpatient Medications  Medication Sig Dispense Refill  . acetaminophen (TYLENOL) 160 MG/5ML solution Take 15.6 mLs (500 mg total) by mouth every 6 (six) hours as needed for mild pain. 120 mL 0  . albuterol (VENTOLIN HFA) 108  (90 Base) MCG/ACT inhaler TAKE 2 PUFFS BY MOUTH EVERY 6 HOURS AS NEEDED FOR WHEEZE (Patient taking differently: Inhale 2 puffs into the lungs every 6 (six) hours as needed for wheezing. ) 18 g 0  . feeding supplement, GLUCERNA SHAKE, (GLUCERNA SHAKE) LIQD Take 237 mLs by mouth 3 (three) times daily between meals. 5000 mL prn  . lidocaine (XYLOCAINE) 2 % solution Use as directed 10 mLs in the mouth or throat every 4 (four) hours as needed. Swallow every 4 hours as needed for esophagitis. 450 mL 1  . metFORMIN (GLUCOPHAGE) 500 MG tablet Take 1 tablet (500 mg total) by mouth 2 (two) times daily with a meal. (Patient taking differently: Take 1,000 mg by mouth daily with breakfast. ) 180 tablet 1  . Nutritional Supplements (FEEDING SUPPLEMENT, OSMOLITE 1.5 CAL,) LIQD Place 1,610 mLs into feeding tube daily. 12000 mL prn  . oxyCODONE (ROXICODONE) 5 MG/5ML solution Place 5 mLs (5 mg total) into feeding tube every 6 (six) hours as needed for severe pain. 200 mL 0  . sucralfate (CARAFATE) 1 g tablet Take 1 tablet (1 g total) by mouth 4 (four) times daily. Dissolve each tablet in 15 cc water before use. 120 tablet 2   No current facility-administered medications for this visit.    PHYSICAL EXAMINATION: ECOG PERFORMANCE STATUS: 2 - Symptomatic, <50% confined to bed  Vitals:   02/03/20 0837  BP: 124/75  Pulse: 97  Resp: 20  Temp: 98.2 F (36.8 C)   Filed Weights  02/03/20 0837  Weight: 159 lb 2 oz (72.2 kg)    GENERAL:alert, no distress and comfortable SKIN: skin color, texture, turgor are normal, no rashes or significant lesions EYES: normal, Conjunctiva are pink and non-injected, sclera clear  NECK: supple, thyroid normal size, non-tender, without nodularity LYMPH:  no palpable lymphadenopathy in the cervical, axillary  LUNGS: clear to auscultation and percussion with normal breathing effort HEART: regular rate & rhythm and no murmurs and no lower extremity edema ABDOMEN:abdomen soft,  non-tender and normal bowel sounds (+) Surgical incision healed well with feeding tube in place.  Musculoskeletal:no cyanosis of digits and no clubbing  NEURO: alert & oriented x 3 with fluent speech, no focal motor/sensory deficits  LABORATORY DATA:  I have reviewed the data as listed CBC Latest Ref Rng & Units 02/03/2020 01/11/2020 01/09/2020  WBC 4.0 - 10.5 K/uL 5.5 7.6 5.6  Hemoglobin 13.0 - 17.0 g/dL 10.9(L) 9.2(L) 8.7(L)  Hematocrit 39 - 52 % 33.7(L) 28.6(L) 27.4(L)  Platelets 150 - 400 K/uL 213 241 181     CMP Latest Ref Rng & Units 02/03/2020 01/12/2020 01/11/2020  Glucose 70 - 99 mg/dL 315(H) 144(H) 173(H)  BUN 8 - 23 mg/dL 16 16 16   Creatinine 0.61 - 1.24 mg/dL 0.93 0.79 0.94  Sodium 135 - 145 mmol/L 134(L) 136 135  Potassium 3.5 - 5.1 mmol/L 5.0 4.5 4.4  Chloride 98 - 111 mmol/L 97(L) 98 101  CO2 22 - 32 mmol/L 26 29 26   Calcium 8.9 - 10.3 mg/dL 9.9 8.6(L) 8.4(L)  Total Protein 6.5 - 8.1 g/dL 6.9 - -  Total Bilirubin 0.3 - 1.2 mg/dL 0.3 - -  Alkaline Phos 38 - 126 U/L 101 - -  AST 15 - 41 U/L 14(L) - -  ALT 0 - 44 U/L 18 - -      RADIOGRAPHIC STUDIES: I have personally reviewed the radiological images as listed and agreed with the findings in the report. No results found.   ASSESSMENT & PLAN:  James Dunn is a 69 y.o. male with    1. Adenocarcinoma of distal esophagus, T3N2M0, stage IVA, ypT0N0  -His 3/12/21EGD showed a large, fungating, partially circumferential mass in the lower third of the esophagus causing dysphagia.His PET from 09/20/19 showedno evidence of metastatic disease. -HisEUS from 09/13/19 which showedlocally advanced adenocarcinoma invading the muscular layer with 2 enlarged LNs, T3N2M0. -He completed standard neoadjuvant concurrent ChemoRT with weekly Carboplatin and Taxol for 6 weeks 09/30/19-11/06/19.  -He proceeded with surgical resection by Dr Servando Snare on 01/01/20. We discussed his surgical path which showed complete response to neoadjuvant  chemoRT with no residual disease in surgery. I am very pleased with the good result -I discussed the reasons published Checkmate 577 trial data, which showed good disease-free survivor benefit of adjuvant nivolumab in resected esophageal cancer with residual disease after chemo RT. Given his complete response to chemo RT, I do not recommend adjuvant nivolumab. -I discussed the risk of cancer recurrence in the future. Although he has achieved complete response from chemo and radiation, he still has some risk of recurrence. His highest risk of recurrence is in the first 2-3 years. I discussed the surveillance plan, which is a physical exam and lab test (including CBC, CMP and CEA) every 3 months for the first 2 years, then every 6-12 months, Endoscopy, and surveillance CT scan every 6-12 month for up to 5 year.  -F/u in 6 months with me with CT scan.    2.Dysphagia, Odynophagia -Started early 2021,  secondary to #1. His baseline weight was 185 pounds. -He initially tolerated normal diet for the most part. Some meat and large pills would get stuck -He did not have initial significant weight loss, so he was not initially placed with a feeding tube. -He has sublingualZofran for antiemetics during chemo. -S/p surgery he is currently tube feeding at night for 10 hours and eating by mouth mostly soft and liquid diet. He is able to maintain weight now.  -S/p surgery he does have mild dysphagia and odynophagia which he is using pain medication. But he is using oxycodone more for his back/neck pain at night due to his position of sleep.  I reviewed as his pain improves, we will slowly reduce his pain medications and stop. He will review this with Dr Servando Snare who is managing his pain medications.  -I encouraged him to increase eating by mouth smaller meals 4-5 times a day.  -He willcontinue to f/u with Dietician    3.H/oTobacco and alcohol use -He has long term tobacco abuse, quit in 03/2019.He  notes he has cut out alcohol recently (09/23/19). I recommend he continue cessation.   4. HTN, HL, DM, GERD -Managed by PCP, on lisinopril, atorvastatin, and metformin. He struggled to take Metformin due to Dysphagia.  -On Protonixfor Acid reflux.  -He was hyperglycemic on tube feeding. I encouraged him to discussed switching to a feeding that has less sugar with his dietician.     PLAN: -Surgical pathology reviewed, I do not recommend adjuvant nivo - he will f/u with Dr. Pia Mau  -f/u in 6 months with lab and CT scan a few days before.     No problem-specific Assessment & Plan notes found for this encounter.   Orders Placed This Encounter  Procedures  . CT Abdomen Pelvis W Contrast    Standing Status:   Future    Standing Expiration Date:   02/02/2021    Order Specific Question:   If indicated for the ordered procedure, I authorize the administration of contrast media per Radiology protocol    Answer:   Yes    Order Specific Question:   Preferred imaging location?    Answer:   [facility]    Order Specific Question:   Release to patient    Answer:   Immediate    Order Specific Question:   Is Oral Contrast requested for this exam?    Answer:   Yes, Per Radiology protocol    Order Specific Question:   Radiology Contrast Protocol - do NOT remove file path    Answer:   \\[email or web address]  . CT Chest W Contrast    Standing Status:   Future    Standing Expiration Date:   02/02/2021    Order Specific Question:   If indicated for the ordered procedure, I authorize the administration of contrast media per Radiology protocol    Answer:   Yes    Order Specific Question:   Preferred imaging location?    Answer:   [facility]    Order Specific Question:   Radiology Contrast Protocol - do NOT remove file path    Answer:   \\[email or web address]   All questions were answered. The patient knows to call the clinic with  any problems, questions or concerns. No barriers to learning was detected. The total time spent in the appointment was 30 minutes.     [name], MD 02/03/2020   I, Joslyn Devon, am acting as scribe for [name], MD.  I have reviewed the above documentation for accuracy and completeness, and I agree with the above.       

## 2020-02-03 ENCOUNTER — Other Ambulatory Visit: Payer: Self-pay

## 2020-02-03 ENCOUNTER — Encounter: Payer: Self-pay | Admitting: Hematology

## 2020-02-03 ENCOUNTER — Inpatient Hospital Stay: Payer: PPO | Attending: Nurse Practitioner | Admitting: Hematology

## 2020-02-03 ENCOUNTER — Inpatient Hospital Stay: Payer: PPO

## 2020-02-03 VITALS — BP 124/75 | HR 97 | Temp 98.2°F | Resp 20 | Ht 67.0 in | Wt 159.1 lb

## 2020-02-03 DIAGNOSIS — E785 Hyperlipidemia, unspecified: Secondary | ICD-10-CM | POA: Insufficient documentation

## 2020-02-03 DIAGNOSIS — I10 Essential (primary) hypertension: Secondary | ICD-10-CM | POA: Insufficient documentation

## 2020-02-03 DIAGNOSIS — Z8501 Personal history of malignant neoplasm of esophagus: Secondary | ICD-10-CM | POA: Insufficient documentation

## 2020-02-03 DIAGNOSIS — Z7984 Long term (current) use of oral hypoglycemic drugs: Secondary | ICD-10-CM | POA: Insufficient documentation

## 2020-02-03 DIAGNOSIS — Z79899 Other long term (current) drug therapy: Secondary | ICD-10-CM | POA: Diagnosis not present

## 2020-02-03 DIAGNOSIS — Z9221 Personal history of antineoplastic chemotherapy: Secondary | ICD-10-CM | POA: Insufficient documentation

## 2020-02-03 DIAGNOSIS — R1310 Dysphagia, unspecified: Secondary | ICD-10-CM | POA: Diagnosis not present

## 2020-02-03 DIAGNOSIS — E1165 Type 2 diabetes mellitus with hyperglycemia: Secondary | ICD-10-CM | POA: Diagnosis not present

## 2020-02-03 DIAGNOSIS — E119 Type 2 diabetes mellitus without complications: Secondary | ICD-10-CM | POA: Diagnosis not present

## 2020-02-03 DIAGNOSIS — Z87891 Personal history of nicotine dependence: Secondary | ICD-10-CM | POA: Diagnosis not present

## 2020-02-03 DIAGNOSIS — R970 Elevated carcinoembryonic antigen [CEA]: Secondary | ICD-10-CM | POA: Insufficient documentation

## 2020-02-03 DIAGNOSIS — C155 Malignant neoplasm of lower third of esophagus: Secondary | ICD-10-CM

## 2020-02-03 DIAGNOSIS — M542 Cervicalgia: Secondary | ICD-10-CM | POA: Diagnosis not present

## 2020-02-03 DIAGNOSIS — M549 Dorsalgia, unspecified: Secondary | ICD-10-CM | POA: Insufficient documentation

## 2020-02-03 DIAGNOSIS — Z9049 Acquired absence of other specified parts of digestive tract: Secondary | ICD-10-CM | POA: Diagnosis not present

## 2020-02-03 DIAGNOSIS — K219 Gastro-esophageal reflux disease without esophagitis: Secondary | ICD-10-CM | POA: Insufficient documentation

## 2020-02-03 DIAGNOSIS — Z923 Personal history of irradiation: Secondary | ICD-10-CM | POA: Insufficient documentation

## 2020-02-03 LAB — IRON AND TIBC
Iron: 53 ug/dL (ref 42–163)
Saturation Ratios: 14 % — ABNORMAL LOW (ref 20–55)
TIBC: 384 ug/dL (ref 202–409)
UIBC: 331 ug/dL (ref 117–376)

## 2020-02-03 LAB — CBC WITH DIFFERENTIAL (CANCER CENTER ONLY)
Abs Immature Granulocytes: 0.06 10*3/uL (ref 0.00–0.07)
Basophils Absolute: 0 10*3/uL (ref 0.0–0.1)
Basophils Relative: 0 %
Eosinophils Absolute: 0.2 10*3/uL (ref 0.0–0.5)
Eosinophils Relative: 4 %
HCT: 33.7 % — ABNORMAL LOW (ref 39.0–52.0)
Hemoglobin: 10.9 g/dL — ABNORMAL LOW (ref 13.0–17.0)
Immature Granulocytes: 1 %
Lymphocytes Relative: 12 %
Lymphs Abs: 0.7 10*3/uL (ref 0.7–4.0)
MCH: 29.1 pg (ref 26.0–34.0)
MCHC: 32.3 g/dL (ref 30.0–36.0)
MCV: 89.9 fL (ref 80.0–100.0)
Monocytes Absolute: 0.6 10*3/uL (ref 0.1–1.0)
Monocytes Relative: 10 %
Neutro Abs: 4 10*3/uL (ref 1.7–7.7)
Neutrophils Relative %: 73 %
Platelet Count: 213 10*3/uL (ref 150–400)
RBC: 3.75 MIL/uL — ABNORMAL LOW (ref 4.22–5.81)
RDW: 13.7 % (ref 11.5–15.5)
WBC Count: 5.5 10*3/uL (ref 4.0–10.5)
nRBC: 0 % (ref 0.0–0.2)

## 2020-02-03 LAB — CMP (CANCER CENTER ONLY)
ALT: 18 U/L (ref 0–44)
AST: 14 U/L — ABNORMAL LOW (ref 15–41)
Albumin: 3.5 g/dL (ref 3.5–5.0)
Alkaline Phosphatase: 101 U/L (ref 38–126)
Anion gap: 11 (ref 5–15)
BUN: 16 mg/dL (ref 8–23)
CO2: 26 mmol/L (ref 22–32)
Calcium: 9.9 mg/dL (ref 8.9–10.3)
Chloride: 97 mmol/L — ABNORMAL LOW (ref 98–111)
Creatinine: 0.93 mg/dL (ref 0.61–1.24)
GFR, Est AFR Am: >60 mL/min (ref >60)
GFR, Estimated: >60 mL/min (ref >60)
Glucose, Bld: 315 mg/dL — ABNORMAL HIGH (ref 70–99)
Potassium: 5 mmol/L (ref 3.5–5.1)
Sodium: 134 mmol/L — ABNORMAL LOW (ref 135–145)
Total Bilirubin: 0.3 mg/dL (ref 0.3–1.2)
Total Protein: 6.9 g/dL (ref 6.5–8.1)

## 2020-02-03 LAB — FERRITIN: Ferritin: 193 ng/mL (ref 24–336)

## 2020-02-03 LAB — CEA (IN HOUSE-CHCC): CEA (CHCC-In House): 12.32 ng/mL — ABNORMAL HIGH (ref 0.00–5.00)

## 2020-02-05 ENCOUNTER — Telehealth: Payer: Self-pay

## 2020-02-05 ENCOUNTER — Other Ambulatory Visit: Payer: Self-pay | Admitting: Cardiothoracic Surgery

## 2020-02-05 DIAGNOSIS — C155 Malignant neoplasm of lower third of esophagus: Secondary | ICD-10-CM | POA: Diagnosis not present

## 2020-02-05 DIAGNOSIS — J439 Emphysema, unspecified: Secondary | ICD-10-CM | POA: Diagnosis not present

## 2020-02-05 DIAGNOSIS — Z86010 Personal history of colonic polyps: Secondary | ICD-10-CM | POA: Diagnosis not present

## 2020-02-05 DIAGNOSIS — Z85038 Personal history of other malignant neoplasm of large intestine: Secondary | ICD-10-CM | POA: Diagnosis not present

## 2020-02-05 DIAGNOSIS — Z9181 History of falling: Secondary | ICD-10-CM | POA: Diagnosis not present

## 2020-02-05 DIAGNOSIS — N529 Male erectile dysfunction, unspecified: Secondary | ICD-10-CM | POA: Diagnosis not present

## 2020-02-05 DIAGNOSIS — J4520 Mild intermittent asthma, uncomplicated: Secondary | ICD-10-CM | POA: Diagnosis not present

## 2020-02-05 DIAGNOSIS — C159 Malignant neoplasm of esophagus, unspecified: Secondary | ICD-10-CM

## 2020-02-05 DIAGNOSIS — K219 Gastro-esophageal reflux disease without esophagitis: Secondary | ICD-10-CM | POA: Diagnosis not present

## 2020-02-05 DIAGNOSIS — I10 Essential (primary) hypertension: Secondary | ICD-10-CM | POA: Diagnosis not present

## 2020-02-05 DIAGNOSIS — I700 Atherosclerosis of aorta: Secondary | ICD-10-CM | POA: Diagnosis not present

## 2020-02-05 DIAGNOSIS — K5790 Diverticulosis of intestine, part unspecified, without perforation or abscess without bleeding: Secondary | ICD-10-CM | POA: Diagnosis not present

## 2020-02-05 DIAGNOSIS — Z483 Aftercare following surgery for neoplasm: Secondary | ICD-10-CM | POA: Diagnosis not present

## 2020-02-05 DIAGNOSIS — I2510 Atherosclerotic heart disease of native coronary artery without angina pectoris: Secondary | ICD-10-CM | POA: Diagnosis not present

## 2020-02-05 DIAGNOSIS — Z7984 Long term (current) use of oral hypoglycemic drugs: Secondary | ICD-10-CM | POA: Diagnosis not present

## 2020-02-05 DIAGNOSIS — E785 Hyperlipidemia, unspecified: Secondary | ICD-10-CM | POA: Diagnosis not present

## 2020-02-05 DIAGNOSIS — Z87891 Personal history of nicotine dependence: Secondary | ICD-10-CM | POA: Diagnosis not present

## 2020-02-05 DIAGNOSIS — E1169 Type 2 diabetes mellitus with other specified complication: Secondary | ICD-10-CM | POA: Diagnosis not present

## 2020-02-05 NOTE — Telephone Encounter (Signed)
-----   Message from Alla Feeling, NP sent at 02/04/2020  3:31 PM EDT ----- Please let him know iron levels are stable. CEA is mildly elevated likely from recent surgery and smoking history (can be elevated at baseline in smokers). I suggest to repeat this in 4-6 weeks. If he agrees please send schedule message for lab only. Given his good report at f/u with Dr. [name] yesterday, my concern for this is not high.  Thanks, Regan Rakers

## 2020-02-05 NOTE — Telephone Encounter (Signed)
Called to given pt results from labs and plan to come back in 4-6 weeks for labs pt expresses great frustration with repeated appts states he was told he did not have to come back for 6 months and he is not agreeable to come in for labs 4-6weeks no scheduling message sent for lab appt as pt has refused

## 2020-02-05 NOTE — Telephone Encounter (Signed)
Mr Isadore left vm questioning why he needs labs drawn in 4 to 6 weeks.  Colletta [place] explained to him but he was not satisfied.  He requests to speak with Dr. [name]

## 2020-02-06 ENCOUNTER — Other Ambulatory Visit: Payer: Self-pay

## 2020-02-06 ENCOUNTER — Ambulatory Visit (INDEPENDENT_AMBULATORY_CARE_PROVIDER_SITE_OTHER): Payer: Self-pay | Admitting: Cardiothoracic Surgery

## 2020-02-06 VITALS — BP 118/75 | HR 83 | Resp 20 | Ht 67.0 in | Wt 159.4 lb

## 2020-02-06 DIAGNOSIS — Z9181 History of falling: Secondary | ICD-10-CM | POA: Diagnosis not present

## 2020-02-06 DIAGNOSIS — C155 Malignant neoplasm of lower third of esophagus: Secondary | ICD-10-CM | POA: Diagnosis not present

## 2020-02-06 DIAGNOSIS — J439 Emphysema, unspecified: Secondary | ICD-10-CM | POA: Diagnosis not present

## 2020-02-06 DIAGNOSIS — K5790 Diverticulosis of intestine, part unspecified, without perforation or abscess without bleeding: Secondary | ICD-10-CM | POA: Diagnosis not present

## 2020-02-06 DIAGNOSIS — I2510 Atherosclerotic heart disease of native coronary artery without angina pectoris: Secondary | ICD-10-CM | POA: Diagnosis not present

## 2020-02-06 DIAGNOSIS — Z7984 Long term (current) use of oral hypoglycemic drugs: Secondary | ICD-10-CM | POA: Diagnosis not present

## 2020-02-06 DIAGNOSIS — Z85038 Personal history of other malignant neoplasm of large intestine: Secondary | ICD-10-CM | POA: Diagnosis not present

## 2020-02-06 DIAGNOSIS — Z483 Aftercare following surgery for neoplasm: Secondary | ICD-10-CM | POA: Diagnosis not present

## 2020-02-06 DIAGNOSIS — Z86010 Personal history of colonic polyps: Secondary | ICD-10-CM | POA: Diagnosis not present

## 2020-02-06 DIAGNOSIS — E785 Hyperlipidemia, unspecified: Secondary | ICD-10-CM | POA: Diagnosis not present

## 2020-02-06 DIAGNOSIS — J4520 Mild intermittent asthma, uncomplicated: Secondary | ICD-10-CM | POA: Diagnosis not present

## 2020-02-06 DIAGNOSIS — Z09 Encounter for follow-up examination after completed treatment for conditions other than malignant neoplasm: Secondary | ICD-10-CM

## 2020-02-06 DIAGNOSIS — E1169 Type 2 diabetes mellitus with other specified complication: Secondary | ICD-10-CM | POA: Diagnosis not present

## 2020-02-06 DIAGNOSIS — I700 Atherosclerosis of aorta: Secondary | ICD-10-CM | POA: Diagnosis not present

## 2020-02-06 DIAGNOSIS — N529 Male erectile dysfunction, unspecified: Secondary | ICD-10-CM | POA: Diagnosis not present

## 2020-02-06 DIAGNOSIS — I10 Essential (primary) hypertension: Secondary | ICD-10-CM | POA: Diagnosis not present

## 2020-02-06 DIAGNOSIS — Z87891 Personal history of nicotine dependence: Secondary | ICD-10-CM | POA: Diagnosis not present

## 2020-02-06 DIAGNOSIS — K219 Gastro-esophageal reflux disease without esophagitis: Secondary | ICD-10-CM | POA: Diagnosis not present

## 2020-02-06 NOTE — Progress Notes (Signed)
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 SURGICAL PROCEDURE:   1.  Bronchoscopy.   2.  Transhiatal total esophagectomy with cervical esophagogastrostomy.   3.  Pyloroplasty.   4.  Feeding jejunostomy.   5.  Placement of left chest tube   Cancer Staging Esophageal cancer [facility]) Staging form: Esophagus - Adenocarcinoma, AJCC 8th Edition - Clinical stage from 09/13/2019: Stage IVA (cT3, cN2, cM0) - Signed by [name], MD on 09/22/2019 - Pathologic stage from 01/07/2020: Stage I (ypT0, pN0, cM0, G2) - Signed by [name], MD on 01/07/2020   History of Present Illness:     Patient returns office today in follow-up after his recent transhiatal total esophagectomy with cervical esophagogastrostomy pyloroplasty and feeding jejunostomy tube.  He was initially staged as a T3N2 lesion of the distal esophagus, pathologic stage at the time of resection after preoperative chemo and radiation was stage I.  The patient continues to make good progress postoperatively.  He has been using tube feedings at night.  He and his wife both note that he has been eating well solid food and liquids.  He has rare episodes of reflux.  He does note some coughing of white foamy material at times but not necessarily associated with eating.  Weight records from home show that he has progressively gained weight since discharge home.    He takes some oxycodone at night to be comfortable to sleep but other than that is not on any  pain medicine.      Zubrod Score: At the time of surgery this patient's most appropriate activity status/level should be described as: []     0    Normal activity, no symptoms [x]     1    Restricted in physical strenuous activity but ambulatory, able to do out light work []     2    Ambulatory and capable of self care, unable to do work activities, up and about                 >50 % of waking hours                                                                                   []     3    Only limited self care, in bed greater than 50% of waking hours []     4    Completely disabled, no self care, confined to bed or chair []     5    Moribund  Social History   Tobacco Use  Smoking Status Former Smoker  . Types: Cigarettes  . Quit date:  03/21/2019  . Years since quitting: 0.8  Smokeless Tobacco Never Used       No Known Allergies  Current Outpatient Medications  Medication Sig Dispense Refill  . acetaminophen (TYLENOL) 160 MG/5ML solution Take 15.6 mLs (500 mg total) by mouth every 6 (six) hours as needed for mild pain. 120 mL 0  . albuterol (VENTOLIN HFA) 108 (90 Base) MCG/ACT inhaler TAKE 2 PUFFS BY MOUTH EVERY 6 HOURS AS NEEDED FOR WHEEZE (Patient taking differently: Inhale 2 puffs into the lungs every 6 (six) hours as needed for wheezing. ) 18 g 0  . feeding supplement, GLUCERNA SHAKE, (GLUCERNA SHAKE) LIQD Take 237 mLs by mouth 3 (three) times daily between meals. 5000 mL prn  . lidocaine (XYLOCAINE) 2 % solution Use as directed 10 mLs in the mouth or throat every 4 (four) hours as needed. Swallow every 4 hours as needed for esophagitis. 450 mL 1  . metFORMIN (GLUCOPHAGE) 500 MG tablet Take 1 tablet (500 mg total) by mouth 2 (two) times daily with a meal. (Patient taking differently: Take 1,000 mg by mouth daily with breakfast. ) 180 tablet 1  . Nutritional Supplements (FEEDING SUPPLEMENT, OSMOLITE 1.5 CAL,) LIQD Place 1,610 mLs into feeding tube daily. 12000 mL prn  . oxyCODONE  (ROXICODONE) 5 MG/5ML solution Place 5 mLs (5 mg total) into feeding tube every 6 (six) hours as needed for severe pain. 200 mL 0  . sucralfate (CARAFATE) 1 g tablet Take 1 tablet (1 g total) by mouth 4 (four) times daily. Dissolve each tablet in 15 cc water before use. 120 tablet 2   No current facility-administered medications for this visit.       Physical Exam: BP 118/75   Pulse 83   Resp 20   Ht 5\' 7"  (1.702 m)   Wt 159 lb 6.4 oz (72.3 kg)   SpO2 96% Comment: RA  BMI 24.97 kg/m   General appearance: alert, cooperative and no distress Neurologic: intact Heart: regular rate and rhythm, S1, S2 normal, no murmur, click, rub or gallop Lungs: clear to auscultation bilaterally Abdomen: soft, non-tender; bowel sounds normal; no masses,  no organomegaly Extremities: extremities normal, atraumatic, no cyanosis or edema and Homans sign is negative, no sign of DVT Wound: Patient's neck incision abdominal incisions well-healed, jejunostomy tube is in place and functioning without any evidence of infection around it   Diagnostic Studies & Laboratory data:         Recent Radiology Findings: DG Chest 2 View  Result Date: 01/10/2020 CLINICAL DATA:  Status post bronchoscopy with shortness of breath and cough EXAM: CHEST - 2 VIEW COMPARISON:  01/05/2020 FINDINGS: Cardiac shadow is within normal limits. Gastric catheter and right jugular catheter have been removed in the interval. Linear density is again noted over the left neck likely extrinsic to the patient. Lungs are well aerated bilaterally. Small left pleural effusion and left basilar atelectasis is noted stable from the prior exam. IMPRESSION: Mild left basilar atelectasis and effusion. Electronically Signed   By: Inez Catalina M.D.   On: 01/10/2020 08:43    Recent Labs: Lab Results  Component Value Date   WBC 5.5 02/03/2020   HGB 10.9 (L) 02/03/2020   HCT 33.7 (L) 02/03/2020   PLT 213 02/03/2020   GLUCOSE 315 (H) 02/03/2020   CHOL  146 11/07/2018   TRIG 202 (H) 11/07/2018   HDL 35 (L) 11/07/2018   LDLCALC 71 11/07/2018   ALT 18 02/03/2020   AST 14 (L) 02/03/2020  NA 134 (L) 02/03/2020   K 5.0 02/03/2020   CL 97 (L) 02/03/2020   CREATININE 0.93 02/03/2020   BUN 16 02/03/2020   CO2 26 02/03/2020   INR 0.9 12/30/2019   HGBA1C 7.9 (A) 11/13/2019      Assessment / Plan:   Patient stable after recent transhiatal esophagectomy, taking his diet reasonably well-we will plan to transition off nighttime tube feedings, plan to see him back in 2 weeks and if his weight and diet has remained stable likely remove the jejunostomy tube.  Plan return to the office 2 weeks    Medication Changes: No orders of the defined types were placed in this encounter.   [name] 02/07/2020 1:47 PM

## 2020-02-12 NOTE — Telephone Encounter (Signed)
I called him last week and discussed the importance of monitoring  and my recommendation of repeating lab due to recent abnormal tumor marker, he was agreeable but with reluctance. Could you call his wife and let her know? They can call back if he changes his mind and agree to come in for lab in 4-6 weeks. Thanks   [name] MD

## 2020-02-12 NOTE — Telephone Encounter (Signed)
No worries, it looks like he is scheduled for lab at the end of Sep.   [name] MD

## 2020-02-13 DIAGNOSIS — I700 Atherosclerosis of aorta: Secondary | ICD-10-CM | POA: Diagnosis not present

## 2020-02-13 DIAGNOSIS — E1169 Type 2 diabetes mellitus with other specified complication: Secondary | ICD-10-CM | POA: Diagnosis not present

## 2020-02-13 DIAGNOSIS — C155 Malignant neoplasm of lower third of esophagus: Secondary | ICD-10-CM | POA: Diagnosis not present

## 2020-02-13 DIAGNOSIS — K5790 Diverticulosis of intestine, part unspecified, without perforation or abscess without bleeding: Secondary | ICD-10-CM | POA: Diagnosis not present

## 2020-02-13 DIAGNOSIS — Z85038 Personal history of other malignant neoplasm of large intestine: Secondary | ICD-10-CM | POA: Diagnosis not present

## 2020-02-13 DIAGNOSIS — N529 Male erectile dysfunction, unspecified: Secondary | ICD-10-CM | POA: Diagnosis not present

## 2020-02-13 DIAGNOSIS — Z86010 Personal history of colonic polyps: Secondary | ICD-10-CM | POA: Diagnosis not present

## 2020-02-13 DIAGNOSIS — E785 Hyperlipidemia, unspecified: Secondary | ICD-10-CM | POA: Diagnosis not present

## 2020-02-13 DIAGNOSIS — Z87891 Personal history of nicotine dependence: Secondary | ICD-10-CM | POA: Diagnosis not present

## 2020-02-13 DIAGNOSIS — J4520 Mild intermittent asthma, uncomplicated: Secondary | ICD-10-CM | POA: Diagnosis not present

## 2020-02-13 DIAGNOSIS — I10 Essential (primary) hypertension: Secondary | ICD-10-CM | POA: Diagnosis not present

## 2020-02-13 DIAGNOSIS — K219 Gastro-esophageal reflux disease without esophagitis: Secondary | ICD-10-CM | POA: Diagnosis not present

## 2020-02-13 DIAGNOSIS — Z9181 History of falling: Secondary | ICD-10-CM | POA: Diagnosis not present

## 2020-02-13 DIAGNOSIS — I2510 Atherosclerotic heart disease of native coronary artery without angina pectoris: Secondary | ICD-10-CM | POA: Diagnosis not present

## 2020-02-13 DIAGNOSIS — J439 Emphysema, unspecified: Secondary | ICD-10-CM | POA: Diagnosis not present

## 2020-02-13 DIAGNOSIS — Z7984 Long term (current) use of oral hypoglycemic drugs: Secondary | ICD-10-CM | POA: Diagnosis not present

## 2020-02-13 DIAGNOSIS — Z483 Aftercare following surgery for neoplasm: Secondary | ICD-10-CM | POA: Diagnosis not present

## 2020-02-19 NOTE — Progress Notes (Signed)
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 SURGICAL PROCEDURE:   1.  Bronchoscopy.   2.  Transhiatal total esophagectomy with cervical esophagogastrostomy.   3.  Pyloroplasty.   4.  Feeding jejunostomy.   5.  Placement of left chest tube   Cancer Staging Esophageal cancer [facility]) Staging form: Esophagus - Adenocarcinoma, AJCC 8th Edition - Clinical stage from 09/13/2019: Stage IVA (cT3, cN2, cM0) - Signed by [name], MD on 09/22/2019 - Pathologic stage from 01/07/2020: Stage I (ypT0, pN0, cM0, G2) - Signed by [name], MD on 01/07/2020   History of Present Illness:     Patient returns office today in follow-up after his recent transhiatal total esophagectomy with cervical esophagogastrostomy pyloroplasty and feeding jejunostomy tube.  He was initially staged as a T3N2 lesion of the distal esophagus, pathologic Stage I  at the time of resection after preoperative chemo and radiation .  Since last seen the patient has discontinued the use of his tube feedings has been taking a p.o. diet without difficulty.  He has noted some episodes of bile reflux.  Zubrod Score: At the time of surgery this patient's most appropriate activity status/level should be described as: []     0    Normal activity, no symptoms [x]     1    Restricted in physical strenuous activity but ambulatory, able to do out light work []     2    Ambulatory and capable of self care, unable to do work activities, up and about                  >50 % of waking hours                                                                                   []     3    Only limited self care, in bed greater than 50% of waking hours []     4    Completely disabled, no self care, confined to bed or chair []     5    Moribund  Social History   Tobacco Use  Smoking Status Former Smoker  . Types: Cigarettes  . Quit date: 03/21/2019  . Years since quitting: 0.9  Smokeless Tobacco Never Used       No Known Allergies  Current Outpatient Medications  Medication Sig Dispense Refill  . acetaminophen (TYLENOL) 160 MG/5ML solution Take 15.6 mLs (500 mg total) by mouth every 6 (six) hours as needed for mild pain. 120 mL 0  . albuterol (VENTOLIN HFA) 108 (90 Base) MCG/ACT inhaler TAKE 2 PUFFS BY MOUTH  EVERY 6 HOURS AS NEEDED FOR WHEEZE (Patient taking differently: Inhale 2 puffs into the lungs every 6 (six) hours as needed for wheezing. ) 18 g 0  . feeding supplement, GLUCERNA SHAKE, (GLUCERNA SHAKE) LIQD Take 237 mLs by mouth 3 (three) times daily between meals. 5000 mL prn  . lidocaine (XYLOCAINE) 2 % solution Use as directed 10 mLs in the mouth or throat every 4 (four) hours as needed. Swallow every 4 hours as needed for esophagitis. 450 mL 1  . metFORMIN (GLUCOPHAGE) 500 MG tablet Take 1 tablet (500 mg total) by mouth 2 (two) times daily with a meal. (Patient taking differently: Take 1,000 mg by mouth daily with breakfast. ) 180 tablet 1  . Nutritional Supplements (FEEDING SUPPLEMENT, OSMOLITE 1.5 CAL,) LIQD Place 1,610 mLs into feeding tube daily. 12000 mL prn  . oxyCODONE (ROXICODONE) 5 MG/5ML solution Place 5 mLs (5 mg total) into feeding tube every 6 (six) hours as needed for severe pain. 200 mL 0  . sucralfate (CARAFATE) 1 g tablet Take 1 tablet (1 g total) by mouth 4 (four) times daily. Dissolve each tablet in 15 cc water before use. 120 tablet 2   No current facility-administered medications for this visit.       Physical Exam: BP  107/70   Pulse 83   Temp 97.7 F (36.5 C) (Skin)   Resp 20   Ht 5\' 7"  (1.702 m)   Wt 151 lb (68.5 kg)   SpO2 97% Comment: RA  BMI 23.65 kg/m   General appearance: alert, cooperative and no distress Neck: no adenopathy, no carotid bruit, no JVD, supple, symmetrical, trachea midline and thyroid not enlarged, symmetric, no tenderness/mass/nodules Lymph nodes: Cervical, supraclavicular, and axillary nodes normal. Resp: clear to auscultation bilaterally Cardio: regular rate and rhythm, S1, S2 normal, no murmur, click, rub or gallop GI: soft, non-tender; bowel sounds normal; no masses,  no organomegaly Extremities: extremities normal, atraumatic, no cyanosis or edema and Homans sign is negative, no sign of DVT Neurologic: Grossly normal Neck incision is well-healed abdominal incision is well-healed, J-tube is in place without evidence of infection around.  J-tube was removed in the office today in its entirety without difficulty   Diagnostic Studies & Laboratory data:         Recent Radiology Findings: DG Chest 2 View  Result Date: 02/20/2020 CLINICAL DATA:  Esophageal cancer. EXAM: CHEST - 2 VIEW COMPARISON:  01/10/2020 FINDINGS: The cardiopericardial silhouette is within normal limits for size. The lungs are clear without focal pneumonia, edema, pneumothorax or pleural effusion. The visualized bony structures of the thorax show no acute abnormality. Incomplete visualization of catheter tubing over the upper abdomen potentially related to jejunostomy IMPRESSION: No active cardiopulmonary disease. Electronically Signed   By: Misty Stanley M.D.   On: 02/20/2020 09:50    Recent Labs: Lab Results  Component Value Date   WBC 5.5 02/03/2020   HGB 10.9 (L) 02/03/2020   HCT 33.7 (L) 02/03/2020   PLT 213 02/03/2020   GLUCOSE 315 (H) 02/03/2020   CHOL 146 11/07/2018   TRIG 202 (H) 11/07/2018   HDL 35 (L) 11/07/2018   LDLCALC 71 11/07/2018   ALT 18 02/03/2020   AST 14 (L) 02/03/2020   NA  134 (L) 02/03/2020   K 5.0 02/03/2020   CL 97 (L) 02/03/2020   CREATININE 0.93 02/03/2020   BUN 16 02/03/2020   CO2 26 02/03/2020   INR 0.9 12/30/2019   HGBA1C 7.9 (A) 11/13/2019  Wt Readings from Last 3 Encounters:  02/20/20 151 lb (68.5 kg)  02/06/20 159 lb 6.4 oz (72.3 kg)  02/03/20 159 lb 2 oz (72.2 kg)    Assessment / Plan:   #1 status post transhiatal total esophagectomy with cervical esophagogastrostomy and feeding jejunostomy-pathologic stage I after preoperative radiation chemotherapy.-J-tube removed in office today  Plan follow-up visit in 6 weeks  Medication Changes: No orders of the defined types were placed in this encounter.   [name] 03/03/2020 11:12 AM

## 2020-02-20 ENCOUNTER — Ambulatory Visit
Admission: RE | Admit: 2020-02-20 | Discharge: 2020-02-20 | Disposition: A | Discharge status: Home / Self Care | Payer: PPO | Source: Ambulatory Visit | Attending: Cardiothoracic Surgery | Admitting: Cardiothoracic Surgery

## 2020-02-20 ENCOUNTER — Other Ambulatory Visit: Payer: Self-pay

## 2020-02-20 ENCOUNTER — Ambulatory Visit (INDEPENDENT_AMBULATORY_CARE_PROVIDER_SITE_OTHER): Payer: Self-pay | Admitting: Cardiothoracic Surgery

## 2020-02-20 VITALS — BP 107/70 | HR 83 | Temp 97.7°F | Resp 20 | Ht 67.0 in | Wt 151.0 lb

## 2020-02-20 DIAGNOSIS — C159 Malignant neoplasm of esophagus, unspecified: Secondary | ICD-10-CM

## 2020-02-20 DIAGNOSIS — Z09 Encounter for follow-up examination after completed treatment for conditions other than malignant neoplasm: Secondary | ICD-10-CM

## 2020-02-20 DIAGNOSIS — Z8501 Personal history of malignant neoplasm of esophagus: Secondary | ICD-10-CM | POA: Diagnosis not present

## 2020-02-20 DIAGNOSIS — C155 Malignant neoplasm of lower third of esophagus: Secondary | ICD-10-CM

## 2020-02-27 DIAGNOSIS — N529 Male erectile dysfunction, unspecified: Secondary | ICD-10-CM | POA: Diagnosis not present

## 2020-02-27 DIAGNOSIS — I2510 Atherosclerotic heart disease of native coronary artery without angina pectoris: Secondary | ICD-10-CM | POA: Diagnosis not present

## 2020-02-27 DIAGNOSIS — J439 Emphysema, unspecified: Secondary | ICD-10-CM | POA: Diagnosis not present

## 2020-02-27 DIAGNOSIS — J4520 Mild intermittent asthma, uncomplicated: Secondary | ICD-10-CM | POA: Diagnosis not present

## 2020-02-27 DIAGNOSIS — K5790 Diverticulosis of intestine, part unspecified, without perforation or abscess without bleeding: Secondary | ICD-10-CM | POA: Diagnosis not present

## 2020-02-27 DIAGNOSIS — Z86010 Personal history of colonic polyps: Secondary | ICD-10-CM | POA: Diagnosis not present

## 2020-02-27 DIAGNOSIS — I10 Essential (primary) hypertension: Secondary | ICD-10-CM | POA: Diagnosis not present

## 2020-02-27 DIAGNOSIS — E785 Hyperlipidemia, unspecified: Secondary | ICD-10-CM | POA: Diagnosis not present

## 2020-02-27 DIAGNOSIS — I700 Atherosclerosis of aorta: Secondary | ICD-10-CM | POA: Diagnosis not present

## 2020-02-27 DIAGNOSIS — Z85038 Personal history of other malignant neoplasm of large intestine: Secondary | ICD-10-CM | POA: Diagnosis not present

## 2020-02-27 DIAGNOSIS — Z483 Aftercare following surgery for neoplasm: Secondary | ICD-10-CM | POA: Diagnosis not present

## 2020-02-27 DIAGNOSIS — Z87891 Personal history of nicotine dependence: Secondary | ICD-10-CM | POA: Diagnosis not present

## 2020-02-27 DIAGNOSIS — C155 Malignant neoplasm of lower third of esophagus: Secondary | ICD-10-CM | POA: Diagnosis not present

## 2020-02-27 DIAGNOSIS — Z7984 Long term (current) use of oral hypoglycemic drugs: Secondary | ICD-10-CM | POA: Diagnosis not present

## 2020-02-27 DIAGNOSIS — K219 Gastro-esophageal reflux disease without esophagitis: Secondary | ICD-10-CM | POA: Diagnosis not present

## 2020-02-27 DIAGNOSIS — Z9181 History of falling: Secondary | ICD-10-CM | POA: Diagnosis not present

## 2020-02-27 DIAGNOSIS — E1169 Type 2 diabetes mellitus with other specified complication: Secondary | ICD-10-CM | POA: Diagnosis not present

## 2020-03-16 ENCOUNTER — Encounter: Payer: Self-pay | Admitting: Family Medicine

## 2020-03-16 ENCOUNTER — Other Ambulatory Visit: Payer: Self-pay

## 2020-03-16 ENCOUNTER — Ambulatory Visit (INDEPENDENT_AMBULATORY_CARE_PROVIDER_SITE_OTHER): Payer: PPO | Admitting: Family Medicine

## 2020-03-16 VITALS — BP 96/56 | HR 68 | Temp 96.2°F | Wt 147.2 lb

## 2020-03-16 DIAGNOSIS — E1159 Type 2 diabetes mellitus with other circulatory complications: Secondary | ICD-10-CM

## 2020-03-16 DIAGNOSIS — E785 Hyperlipidemia, unspecified: Secondary | ICD-10-CM

## 2020-03-16 DIAGNOSIS — E1169 Type 2 diabetes mellitus with other specified complication: Secondary | ICD-10-CM | POA: Diagnosis not present

## 2020-03-16 DIAGNOSIS — Z23 Encounter for immunization: Secondary | ICD-10-CM | POA: Diagnosis not present

## 2020-03-16 DIAGNOSIS — I10 Essential (primary) hypertension: Secondary | ICD-10-CM | POA: Diagnosis not present

## 2020-03-16 DIAGNOSIS — C155 Malignant neoplasm of lower third of esophagus: Secondary | ICD-10-CM | POA: Diagnosis not present

## 2020-03-16 DIAGNOSIS — R1310 Dysphagia, unspecified: Secondary | ICD-10-CM | POA: Diagnosis not present

## 2020-03-16 DIAGNOSIS — E119 Type 2 diabetes mellitus without complications: Secondary | ICD-10-CM

## 2020-03-16 DIAGNOSIS — K219 Gastro-esophageal reflux disease without esophagitis: Secondary | ICD-10-CM | POA: Diagnosis not present

## 2020-03-16 DIAGNOSIS — R1110 Vomiting, unspecified: Secondary | ICD-10-CM | POA: Diagnosis not present

## 2020-03-16 LAB — POCT GLYCOSYLATED HEMOGLOBIN (HGB A1C): Hemoglobin A1C: 7.5 % — AB (ref 4.0–5.6)

## 2020-03-16 NOTE — Progress Notes (Signed)
  Subjective:    Patient ID: James Dunn, male    DOB: 11-03-1950, 69 y.o.   MRN: [id number]  James Dunn is a 69 y.o. male who presents for follow-up of Type 2 diabetes mellitus.  Home blood sugar records: meter records, avg 130-140, fasting  Current symptoms/problems include none at this time. Daily foot checks: yes   Any foot concerns: none Exercise: walking  Diet: poor,  can't eat.  He notes that he is now having difficulty swallowing solid foods but does have no problem with liquid.  He plans to discuss this with [facility] as this is related to his cancer.  He does use Metformin but has to chop it up in order to swallow it.  He is using nutritional supplements. The following portions of the patient's history were reviewed and updated as appropriate: allergies, current medications, past medical history, past social history and problem list.  ROS as in subjective above.     Objective:    Physical Exam Alert and in no distress otherwise not examined.   Lab Review Diabetic Labs Latest Ref Rng & Units 02/03/2020 01/12/2020 01/11/2020 01/09/2020 01/06/2020  HbA1c 4.0 - 5.6 % - - - - -  Microalbumin mg/L - - - - -  Micro/Creat Ratio - - - - - -  Chol 100 - 199 mg/dL - - - - -  HDL >39 mg/dL - - - - -  Calc LDL 0 - 99 mg/dL - - - - -  Triglycerides 0 - 149 mg/dL - - - - -  Creatinine 0.61 - 1.24 mg/dL 0.93 0.79 0.94 0.86 0.75   BP/Weight 02/20/2020 02/06/2020 02/03/2020 01/13/2020 5/64/3329  Systolic BP 518 841 660 630 -  Diastolic BP 70 75 75 62 -  Wt. (Lbs) 151 159.4 159.13 - 161.82  BMI 23.65 24.97 24.92 - -   Foot/eye exam completion dates Latest Ref Rng & Units 08/02/2017 01/16/2017  Eye Exam No Retinopathy - No Retinopathy  Foot Form Completion - Done -  Hemoglobin A1c is 7.5  James Dunn  reports that he quit smoking about a year ago. His smoking use included cigarettes. He has never used smokeless tobacco. He reports previous alcohol use of about 7.0 standard drinks of alcohol per week. He  reports that he does not use drugs.     Assessment & Plan:    Controlled type 2 diabetes mellitus without complication, without long-term current use of insulin ([place]) - Plan: POCT glycosylated hemoglobin (Hb A1C), Ambulatory referral to Ophthalmology  Hyperlipidemia associated with type 2 diabetes mellitus ([place])  Malignant neoplasm of lower third of esophagus ([place])  Need for influenza vaccination - Plan: Flu Vaccine QUAD High Dose(Fluad)  Hypertension associated with diabetes ([place])   1.  2. Rx changes: none 3. Education: Reviewed 'ABCs' of diabetes management (respective goals in parentheses):  A1C (<7), blood pressure (<130/80), and cholesterol (LDL <100). 4. Compliance at present is estimated to be good. Efforts to improve compliance (if necessary) will be directed at dietary modifications: As per recommendation of CVTS. 5. Follow up: 4 months He seems to be doing fairly well overall all things considered.  Strongly encouraged him to call [facility] concerning what sounds as if he is having difficulty with esophageal stricture.  He will follow up with them.  He will be set up to see ophthalmology.  Flu shot also given.

## 2020-03-17 ENCOUNTER — Inpatient Hospital Stay: Payer: PPO | Attending: Nurse Practitioner

## 2020-03-17 ENCOUNTER — Other Ambulatory Visit: Payer: Self-pay

## 2020-03-17 DIAGNOSIS — E119 Type 2 diabetes mellitus without complications: Secondary | ICD-10-CM | POA: Diagnosis not present

## 2020-03-17 DIAGNOSIS — Z8501 Personal history of malignant neoplasm of esophagus: Secondary | ICD-10-CM | POA: Insufficient documentation

## 2020-03-17 DIAGNOSIS — R970 Elevated carcinoembryonic antigen [CEA]: Secondary | ICD-10-CM | POA: Diagnosis not present

## 2020-03-17 DIAGNOSIS — C155 Malignant neoplasm of lower third of esophagus: Secondary | ICD-10-CM | POA: Diagnosis not present

## 2020-03-17 LAB — CBC WITH DIFFERENTIAL (CANCER CENTER ONLY)
Abs Immature Granulocytes: 0.02 10*3/uL (ref 0.00–0.07)
Basophils Absolute: 0 10*3/uL (ref 0.0–0.1)
Basophils Relative: 0 %
Eosinophils Absolute: 0.2 10*3/uL (ref 0.0–0.5)
Eosinophils Relative: 4 %
HCT: 37.6 % — ABNORMAL LOW (ref 39.0–52.0)
Hemoglobin: 12.2 g/dL — ABNORMAL LOW (ref 13.0–17.0)
Immature Granulocytes: 0 %
Lymphocytes Relative: 17 %
Lymphs Abs: 0.8 10*3/uL (ref 0.7–4.0)
MCH: 27.6 pg (ref 26.0–34.0)
MCHC: 32.4 g/dL (ref 30.0–36.0)
MCV: 85.1 fL (ref 80.0–100.0)
Monocytes Absolute: 0.3 10*3/uL (ref 0.1–1.0)
Monocytes Relative: 7 %
Neutro Abs: 3.3 10*3/uL (ref 1.7–7.7)
Neutrophils Relative %: 72 %
Platelet Count: 179 10*3/uL (ref 150–400)
RBC: 4.42 MIL/uL (ref 4.22–5.81)
RDW: 14.3 % (ref 11.5–15.5)
WBC Count: 4.6 10*3/uL (ref 4.0–10.5)
nRBC: 0 % (ref 0.0–0.2)

## 2020-03-17 LAB — CMP (CANCER CENTER ONLY)
ALT: 23 U/L (ref 0–44)
AST: 19 U/L (ref 15–41)
Albumin: 4 g/dL (ref 3.5–5.0)
Alkaline Phosphatase: 84 U/L (ref 38–126)
Anion gap: 3 — ABNORMAL LOW (ref 5–15)
BUN: 10 mg/dL (ref 8–23)
CO2: 32 mmol/L (ref 22–32)
Calcium: 9.9 mg/dL (ref 8.9–10.3)
Chloride: 103 mmol/L (ref 98–111)
Creatinine: 0.95 mg/dL (ref 0.61–1.24)
GFR, Est AFR Am: >60 mL/min (ref >60)
GFR, Estimated: >60 mL/min (ref >60)
Glucose, Bld: 108 mg/dL — ABNORMAL HIGH (ref 70–99)
Potassium: 4.4 mmol/L (ref 3.5–5.1)
Sodium: 138 mmol/L (ref 135–145)
Total Bilirubin: 0.6 mg/dL (ref 0.3–1.2)
Total Protein: 7.4 g/dL (ref 6.5–8.1)

## 2020-03-17 LAB — FERRITIN: Ferritin: 213 ng/mL (ref 24–336)

## 2020-03-17 LAB — IRON AND TIBC
Iron: 53 ug/dL (ref 42–163)
Saturation Ratios: 15 % — ABNORMAL LOW (ref 20–55)
TIBC: 345 ug/dL (ref 202–409)
UIBC: 292 ug/dL (ref 117–376)

## 2020-03-17 LAB — CEA (IN HOUSE-CHCC): CEA (CHCC-In House): 6.65 ng/mL — ABNORMAL HIGH (ref 0.00–5.00)

## 2020-03-18 ENCOUNTER — Telehealth: Payer: Self-pay

## 2020-03-18 ENCOUNTER — Other Ambulatory Visit: Payer: Self-pay | Admitting: Gastroenterology

## 2020-03-18 ENCOUNTER — Encounter (HOSPITAL_COMMUNITY): Payer: Self-pay | Admitting: Gastroenterology

## 2020-03-18 ENCOUNTER — Other Ambulatory Visit (HOSPITAL_COMMUNITY)
Admission: RE | Admit: 2020-03-18 | Discharge: 2020-03-18 | Disposition: A | Discharge status: Home / Self Care | Payer: PPO | Source: Ambulatory Visit | Attending: Gastroenterology | Admitting: Gastroenterology

## 2020-03-18 ENCOUNTER — Other Ambulatory Visit: Payer: Self-pay

## 2020-03-18 DIAGNOSIS — Z01812 Encounter for preprocedural laboratory examination: Secondary | ICD-10-CM | POA: Diagnosis not present

## 2020-03-18 DIAGNOSIS — Z20822 Contact with and (suspected) exposure to covid-19: Secondary | ICD-10-CM | POA: Insufficient documentation

## 2020-03-18 LAB — LIPID PANEL W/O CHOL/HDL RATIO
Cholesterol, Total: 159 mg/dL
HDL: 30 mg/dL — ABNORMAL LOW (ref >39)
LDL Chol Calc (NIH): 103 mg/dL
Triglycerides: 147 mg/dL
VLDL Cholesterol Cal: 26 mg/dL (ref 5–40)

## 2020-03-18 LAB — SARS CORONAVIRUS 2 (TAT 6-24 HRS): SARS Coronavirus 2: NEGATIVE

## 2020-03-18 NOTE — Telephone Encounter (Signed)
-----   Message from Alla Feeling, NP sent at 03/18/2020  8:35 AM EDT ----- Please let him know lab results. Iron studies are normal. Anemia much improved. CEA near normal, probably slightly elevated at baseline due to smoking history. Will monitor in the future but no concerns now.   Thanks, Regan Rakers

## 2020-03-18 NOTE — Telephone Encounter (Signed)
Gave patient results per [facility] NP. Patient verbalized understanding.

## 2020-03-19 ENCOUNTER — Encounter: Payer: Self-pay | Admitting: Family Medicine

## 2020-03-20 ENCOUNTER — Encounter (HOSPITAL_COMMUNITY)
Admission: RE | Disposition: A | Discharge status: Home / Self Care | Payer: Self-pay | Source: Home / Self Care | Attending: Gastroenterology

## 2020-03-20 ENCOUNTER — Ambulatory Visit (HOSPITAL_COMMUNITY)
Admission: RE | Admit: 2020-03-20 | Discharge: 2020-03-20 | Disposition: A | Discharge status: Home / Self Care | Payer: PPO | Attending: Gastroenterology | Admitting: Gastroenterology

## 2020-03-20 ENCOUNTER — Other Ambulatory Visit: Payer: Self-pay

## 2020-03-20 ENCOUNTER — Ambulatory Visit (HOSPITAL_COMMUNITY): Payer: PPO | Admitting: Certified Registered Nurse Anesthetist

## 2020-03-20 ENCOUNTER — Encounter (HOSPITAL_COMMUNITY): Payer: Self-pay | Admitting: Gastroenterology

## 2020-03-20 DIAGNOSIS — Z9889 Other specified postprocedural states: Secondary | ICD-10-CM | POA: Diagnosis not present

## 2020-03-20 DIAGNOSIS — Z9049 Acquired absence of other specified parts of digestive tract: Secondary | ICD-10-CM | POA: Diagnosis not present

## 2020-03-20 DIAGNOSIS — R1310 Dysphagia, unspecified: Secondary | ICD-10-CM | POA: Diagnosis not present

## 2020-03-20 DIAGNOSIS — I10 Essential (primary) hypertension: Secondary | ICD-10-CM | POA: Insufficient documentation

## 2020-03-20 DIAGNOSIS — Z87891 Personal history of nicotine dependence: Secondary | ICD-10-CM | POA: Diagnosis not present

## 2020-03-20 DIAGNOSIS — J4520 Mild intermittent asthma, uncomplicated: Secondary | ICD-10-CM | POA: Insufficient documentation

## 2020-03-20 DIAGNOSIS — Z9221 Personal history of antineoplastic chemotherapy: Secondary | ICD-10-CM | POA: Insufficient documentation

## 2020-03-20 DIAGNOSIS — E785 Hyperlipidemia, unspecified: Secondary | ICD-10-CM | POA: Diagnosis not present

## 2020-03-20 DIAGNOSIS — K222 Esophageal obstruction: Secondary | ICD-10-CM | POA: Insufficient documentation

## 2020-03-20 DIAGNOSIS — E1159 Type 2 diabetes mellitus with other circulatory complications: Secondary | ICD-10-CM | POA: Insufficient documentation

## 2020-03-20 DIAGNOSIS — Z923 Personal history of irradiation: Secondary | ICD-10-CM | POA: Insufficient documentation

## 2020-03-20 DIAGNOSIS — Z8501 Personal history of malignant neoplasm of esophagus: Secondary | ICD-10-CM | POA: Insufficient documentation

## 2020-03-20 DIAGNOSIS — I2510 Atherosclerotic heart disease of native coronary artery without angina pectoris: Secondary | ICD-10-CM | POA: Insufficient documentation

## 2020-03-20 DIAGNOSIS — Z86010 Personal history of colonic polyps: Secondary | ICD-10-CM | POA: Insufficient documentation

## 2020-03-20 HISTORY — PX: ESOPHAGOGASTRODUODENOSCOPY (EGD) WITH PROPOFOL: SHX5813

## 2020-03-20 HISTORY — DX: Thoracic aortic aneurysm, without rupture, unspecified: I71.20

## 2020-03-20 HISTORY — DX: Thoracic aortic aneurysm, without rupture: I71.2

## 2020-03-20 HISTORY — PX: BALLOON DILATION: SHX5330

## 2020-03-20 LAB — GLUCOSE, CAPILLARY: Glucose-Capillary: 93 mg/dL (ref 70–99)

## 2020-03-20 SURGERY — ESOPHAGOGASTRODUODENOSCOPY (EGD) WITH PROPOFOL
Anesthesia: Monitor Anesthesia Care

## 2020-03-20 MED ORDER — LIDOCAINE 2% (20 MG/ML) 5 ML SYRINGE
INTRAMUSCULAR | Status: DC | PRN
  Administered 2020-03-20: 80 mg via INTRAVENOUS

## 2020-03-20 MED ORDER — PROPOFOL 500 MG/50ML IV EMUL
INTRAVENOUS | Status: DC | PRN
  Administered 2020-03-20: 130 ug/kg/min via INTRAVENOUS

## 2020-03-20 MED ORDER — LACTATED RINGERS IV SOLN: INTRAVENOUS | Status: DC

## 2020-03-20 MED ORDER — SODIUM CHLORIDE 0.9 % IV SOLN: INTRAVENOUS | Status: DC

## 2020-03-20 MED ORDER — PROPOFOL 500 MG/50ML IV EMUL
INTRAVENOUS | Status: DC | PRN
  Administered 2020-03-20: 30 mg via INTRAVENOUS

## 2020-03-20 MED ORDER — PROPOFOL 500 MG/50ML IV EMUL
INTRAVENOUS | Status: AC
  Filled 2020-03-20: qty 50

## 2020-03-20 SURGICAL SUPPLY — 15 items

## 2020-03-20 NOTE — Anesthesia Preprocedure Evaluation (Addendum)
Anesthesia Evaluation  Patient identified by MRN, date of birth, ID band Patient awake    Reviewed: Allergy & Precautions, NPO status , Patient's Chart, lab work & pertinent test results  Airway Mallampati: III  TM Distance: >3 FB Neck ROM: Full    Dental  (+) Dental Advisory Given, Missing,    Pulmonary asthma , former smoker,    breath sounds clear to auscultation       Cardiovascular hypertension, Pt. on medications + CAD   Rhythm:Regular Rate:Normal     Neuro/Psych negative neurological ROS  negative psych ROS   GI/Hepatic Neg liver ROS, GERD  Medicated and Controlled,Dysphagia Hx/o esophageal Ca S/P esophagectomy Hx/o diverticulosis   Endo/Other  diabetes, Well Controlled, Type 2, Oral Hypoglycemic AgentsHyperlipidemia  Renal/GU negative Renal ROS   ED    Musculoskeletal negative musculoskeletal ROS (+)   Abdominal Normal abdominal exam  (+)   Peds  Hematology negative hematology ROS (+) anemia ,   Anesthesia Other Findings   Reproductive/Obstetrics                            Anesthesia Physical  Anesthesia Plan  ASA: III  Anesthesia Plan: MAC   Post-op Pain Management:    Induction: Intravenous  PONV Risk Score and Plan: 2 and Propofol infusion  Airway Management Planned: Natural Airway and Nasal Cannula  Additional Equipment:   Intra-op Plan:   Post-operative Plan:   Informed Consent:   Plan Discussed with: CRNA and Anesthesiologist  Anesthesia Plan Comments:         Anesthesia Quick Evaluation

## 2020-03-20 NOTE — Discharge Instructions (Signed)
Upper Endoscopy, Adult, Care After  This sheet gives you information about how to care for yourself after your procedure. Your health care provider may also give you more specific instructions. If you have problems or questions, contact your health care provider.  What can I expect after the procedure?  After the procedure, it is common to have:  · A sore throat.  · Mild stomach pain or discomfort.  · Bloating.  · Nausea.  Follow these instructions at home:    · Follow instructions from your health care provider about what to eat or drink after your procedure.  · Return to your normal activities as told by your health care provider. Ask your health care provider what activities are safe for you.  · Take over-the-counter and prescription medicines only as told by your health care provider.  · Do not drive for 24 hours if you were given a sedative during your procedure.  · Keep all follow-up visits as told by your health care provider. This is important.  Contact a health care provider if you have:  · A sore throat that lasts longer than one day.  · Trouble swallowing.  Get help right away if:  · You vomit blood or your vomit looks like coffee grounds.  · You have:  ? A fever.  ? Bloody, black, or tarry stools.  ? A severe sore throat or you cannot swallow.  ? Difficulty breathing.  ? Severe pain in your chest or abdomen.  Summary  · After the procedure, it is common to have a sore throat, mild stomach discomfort, bloating, and nausea.  · Do not drive for 24 hours if you were given a sedative during the procedure.  · Follow instructions from your health care provider about what to eat or drink after your procedure.  · Return to your normal activities as told by your health care provider.  This information is not intended to replace advice given to you by your health care provider. Make sure you discuss any questions you have with your health care provider.  Document Revised: 11/28/2017 Document Reviewed:  11/06/2017  Elsevier Patient Education © 2020 [employer]

## 2020-03-20 NOTE — Transfer of Care (Signed)
Immediate Anesthesia Transfer of Care Note  Patient: BRYLON BRENNING  Procedure(s) Performed: ESOPHAGOGASTRODUODENOSCOPY (EGD) WITH PROPOFOL (N/A ) BALLOON DILATION (N/A )  Patient Location: Endoscopy Unit  Anesthesia Type:MAC  Level of Consciousness: drowsy and patient cooperative  Airway & Oxygen Therapy: Patient Spontanous Breathing and Patient connected to face mask oxygen  Post-op Assessment: Report given to RN, Post -op Vital signs reviewed and stable and Patient moving all extremities  Post vital signs: Reviewed and stable  Last Vitals:  Vitals Value Taken Time  BP    Temp    Pulse 72 03/20/20 1447  Resp 20 03/20/20 1447  SpO2 100 % 03/20/20 1447  Vitals shown include unvalidated device data.  Last Pain:  Vitals:   03/20/20 1353  TempSrc: Axillary  PainSc: 0-No pain         Complications: No complications documented.

## 2020-03-20 NOTE — H&P (Signed)
James Dunn HPI: The patient was diagnosed with a distal esophageal adenocarcinoma. The patient underwent chemo/XRT and then an esophgectomy with cervical esophagogastrostomy. The pathology in the surgical specimen did not show any residual tumor. Over the past couple of weeks he noticed a worsening of his dysphagia. Last evening he suffered dysphagia when he was eating shrimp. Eating shrimp is a routine food for him, but he does notice that he is having more problems with swallowing.   Past Medical History:  Diagnosis Date  . Allergy   . Asthma   . Controlled type 2 diabetes mellitus without complication, without long-term current use of insulin ([place]) 06/25/2012  . Diverticulosis   . ED (erectile dysfunction) 12/28/2010  . Esophageal cancer ([place]) 09/22/2019  . Former smoker 06/25/2012  . GERD (gastroesophageal reflux disease) 2002  . History of colonic polyps 05/11/2016  . Hyperlipidemia associated with type 2 diabetes mellitus ([place]) 05/11/2016  . Hypertension associated with diabetes ([place]) 08/02/2017  . Mild intermittent asthma without complication 02/15/9370  . Seasonal allergic rhinitis due to pollen 11/29/2016  . Thoracic aortic aneurysm ([facility])    Ascending thoracic aorta measures 4 cm diameter  . Tubular adenoma of colon 12/05/2016    Past Surgical History:  Procedure Laterality Date  . BIOPSY  08/30/2019   Procedure: BIOPSY;  Surgeon: [name], MD;  Location: [facility];  Service: Endoscopy;;  . CHEST TUBE INSERTION Left 01/01/2020   Procedure: CHEST TUBE INSERTION;  Surgeon: [name], MD;  Location: [facility];  Service: Thoracic;  Laterality: Left;  . COMPLETE ESOPHAGECTOMY N/A 01/01/2020   Procedure: TRANSHIATAL TOTAL ESOPHAGECTOMY COMPLETE WITH NIMS TUBE;  Surgeon: [name], MD;  Location: [facility] OR;  Service: Thoracic;  Laterality: N/A;  . ESOPHAGOGASTRODUODENOSCOPY (EGD) WITH PROPOFOL N/A 08/30/2019   Procedure: ESOPHAGOGASTRODUODENOSCOPY (EGD) WITH PROPOFOL;   Surgeon: [name], MD;  Location: [facility];  Service: Endoscopy;  Laterality: N/A;  . ESOPHAGOGASTRODUODENOSCOPY (EGD) WITH PROPOFOL N/A 09/13/2019   Procedure: ESOPHAGOGASTRODUODENOSCOPY (EGD) WITH PROPOFOL;  Surgeon: [name], MD;  Location: [facility];  Service: Endoscopy;  Laterality: N/A;  . JEJUNOSTOMY N/A 01/01/2020   Procedure: [name];  Surgeon: [name], MD;  Location: [facility];  Service: Thoracic;  Laterality: N/A;  . SKIN BIOPSY N/A 03/04/2019   upper back Nerofibroma and follicular cyst   . SUBMUCOSAL INJECTION  08/30/2019   Procedure: SUBMUCOSAL INJECTION;  Surgeon: [name], MD;  Location: [facility];  Service: Endoscopy;;  . UPPER ESOPHAGEAL ENDOSCOPIC ULTRASOUND (EUS) N/A 09/13/2019   Procedure: UPPER ESOPHAGEAL ENDOSCOPIC ULTRASOUND (EUS);  Surgeon: [name], MD;  Location: [facility];  Service: Endoscopy;  Laterality: N/A;  . VIDEO BRONCHOSCOPY N/A 01/01/2020   Procedure: VIDEO BRONCHOSCOPY WITH BRONCHIAL WASHING FOR CULTURES AND GRAM STAIN;  Surgeon: [name], MD;  Location: [facility] OR;  Service: Thoracic;  Laterality: N/A;    Family History  Problem Relation Age of Onset  . Emphysema Mother   . AAA (abdominal aortic aneurysm) Father     Social History:  reports that he quit smoking about a year ago. His smoking use included cigarettes. He has never used smokeless tobacco. He reports previous alcohol use of about 7.0 standard drinks of alcohol per week. He reports that he does not use drugs.  Allergies: No Known Allergies  Medications:  Scheduled:  Continuous: . lactated ringers 10 mL/hr at 03/20/20 1355    Results for orders placed or performed during the hospital encounter of 03/20/20 (from the past 24 hour(s))  Glucose, capillary     Status: None   Collection Time: 03/20/20  1:58 PM  Result Value Ref Range   Glucose-Capillary 93 70 - 99 mg/dL     No results found.  ROS:  As stated above in the HPI otherwise  negative.  Blood pressure 125/64, pulse (!) 55, temperature 97.7 F (36.5 C), temperature source Axillary, resp. rate 15, height 5\' 7"  (1.702 m), weight 65.8 kg, SpO2 98 %.    PE: Gen: NAD, Alert and Oriented HEENT:  [place]/AT, EOMI Neck: Supple, no LAD Lungs: CTA Bilaterally CV: RRR without M/G/R ABD: Soft, NTND, +BS Ext: No C/C/E  Assessment/Plan: 1) Dysphagia. 2) S/p esophagectomy with gastric pull-through.  Plan: 1) EGD with dilation.  Caylie Sandquist D 03/20/2020, 2:07 PM

## 2020-03-20 NOTE — Op Note (Signed)
[facility] Patient Name: James Dunn Procedure Date: 03/20/2020 MRN: [id number] Attending MD: [name] , MD Date of Birth: 23-Mar-1951 CSN: [id number] Age: 69 Admit Type: Outpatient Procedure:                Upper GI endoscopy Indications:              Dysphagia Providers:                [name], MD, Carmie End, RN, Erenest Rasher, RN, William Dalton, Technician Referring MD:              Medicines:                 Complications:            No immediate complications. Estimated Blood Loss:     Estimated blood loss was minimal. Procedure:                Pre-Anesthesia Assessment:                           - Prior to the procedure, a History and Physical                            was performed, and patient medications and                            allergies were reviewed. The patient's tolerance of                            previous anesthesia was also reviewed. The risks                            and benefits of the procedure and the sedation                            options and risks were discussed with the patient.                            All questions were answered, and informed consent                            was obtained. Prior Anticoagulants: The patient has                            taken no previous anticoagulant or antiplatelet                            agents. ASA Grade Assessment: III - A patient with                            severe systemic disease. After reviewing the risks  and benefits, the patient was deemed in                            satisfactory condition to undergo the procedure.                           - Sedation was administered by an anesthesia                            professional. Deep sedation was attained.                           After obtaining informed consent, the endoscope was                            passed under direct vision. Throughout the                             procedure, the patient's blood pressure, pulse, and                            oxygen saturations were monitored continuously. The                            GIF-H190 ([id number]) Olympus gastroscope was                            introduced through the mouth, and advanced to the                            third part of duodenum. The upper GI endoscopy was                            somewhat difficult. The patient tolerated the                            procedure well. Scope In: Scope Out: Findings:      One benign-appearing, intrinsic severe stenosis was found 20 cm from the       incisors. This stenosis measured 8 mm (inner diameter) x less than one       cm (in length). The stenosis was traversed after dilation. A TTS dilator       was passed through the scope. Dilation with an 01-26-09 mm balloon dilator       was performed to 10 mm. The dilation site was examined and showed       complete resolution of luminal narrowing. Estimated blood loss was       minimal.      An esophago-gastric anastomosis was found in the upper third of the       esophagus.      The gastric body, gastric antrum and pylorus were normal.      The examined duodenum was normal.      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 The endoscope was not       able to pass through the area. A surgical staple was identified at this  time. There was no evidence of malignancy. Using an 8-10 mm balloon       dilation was started at 8 mm. At this diameter the balloon was able to       move freely, which was also the same at 9 mm. Both diameters were held       in place for 30 seconds. The balloon was then increased up to 10 mm and       held in place for 1 minute, with some free movement of the balloon.       After one minute the balloon was pushed distally into the gastric lumen       with the endoscope following. Some resistance was encountered when the       endoscope reached the stricture.  Gentle up and down tip deflection       allowed for the endoscope to move through the stricture. Evaluation of       the stricture demonstrated the expected mucosal disruption. There was no       evidence of crepitus during the produre or immediately afterwards. The       rest of the gastric lumen and duodenum appeared to be normal. Impression:               - Benign-appearing esophageal stenosis. Dilated.                           - An esophago-gastric anastomosis was found.                           - Normal gastric body, antrum and pylorus.                           - Normal examined duodenum.                           - No specimens collected. Moderate Sedation:      Not Applicable - Patient had care per Anesthesia. Recommendation:           - Patient has a contact number available for                            emergencies. The signs and symptoms of potential                            delayed complications were discussed with the                            patient. Return to normal activities tomorrow.                            Written discharge instructions were provided to the                            patient.                           - Clear liquid diet for 4 hours and then advance  diet as tolerated.                           - Continue present medications.                           - Repeat upper endoscopy in 2 weeks for retreatment. Procedure Code(s):        --- Professional ---                           [id number], Esophagogastroduodenoscopy, flexible,                            transoral; with transendoscopic balloon dilation of                            esophagus (less than 30 mm diameter) Diagnosis Code(s):        --- Professional ---                           K22.2, Esophageal obstruction                           Z98.890, Other specified postprocedural states                           R13.10, Dysphagia, unspecified CPT copyright 2019 American  Medical Association. All rights reserved. The codes documented in this report are preliminary and upon coder review may  be revised to meet current compliance requirements. [name], MD [name], MD 03/20/2020 2:54:46 PM This report has been signed electronically. Number of Addenda: 0

## 2020-03-20 NOTE — Anesthesia Postprocedure Evaluation (Signed)
Anesthesia Post Note  Patient: James Dunn  Procedure(s) Performed: ESOPHAGOGASTRODUODENOSCOPY (EGD) WITH PROPOFOL (N/A ) BALLOON DILATION (N/A )     Patient location during evaluation: PACU Anesthesia Type: MAC Level of consciousness: awake and alert Pain management: pain level controlled Vital Signs Assessment: post-procedure vital signs reviewed and stable Respiratory status: spontaneous breathing, nonlabored ventilation and respiratory function stable Cardiovascular status: stable and blood pressure returned to baseline Postop Assessment: no apparent nausea or vomiting Anesthetic complications: no   No complications documented.  Last Vitals:  Vitals:   03/20/20 1446 03/20/20 1450  BP: (!) 110/57 (!) 118/58  Pulse: 71 71  Resp: 15 15  Temp:  (!) 35.8 C  SpO2: 100% 100%    Last Pain:  Vitals:   03/20/20 1446  TempSrc:   PainSc: 0-No pain                 [name]

## 2020-03-23 ENCOUNTER — Other Ambulatory Visit: Payer: Self-pay | Admitting: Gastroenterology

## 2020-03-24 ENCOUNTER — Encounter (HOSPITAL_COMMUNITY): Payer: Self-pay | Admitting: Gastroenterology

## 2020-03-26 ENCOUNTER — Ambulatory Visit (INDEPENDENT_AMBULATORY_CARE_PROVIDER_SITE_OTHER): Payer: Self-pay | Admitting: Cardiothoracic Surgery

## 2020-03-26 ENCOUNTER — Other Ambulatory Visit: Payer: Self-pay

## 2020-03-26 ENCOUNTER — Encounter: Payer: Self-pay | Admitting: Cardiothoracic Surgery

## 2020-03-26 VITALS — BP 111/63 | HR 91 | Temp 97.5°F | Resp 18 | Ht 67.0 in | Wt 145.8 lb

## 2020-03-26 DIAGNOSIS — Z09 Encounter for follow-up examination after completed treatment for conditions other than malignant neoplasm: Secondary | ICD-10-CM

## 2020-03-26 DIAGNOSIS — C155 Malignant neoplasm of lower third of esophagus: Secondary | ICD-10-CM

## 2020-03-26 NOTE — Progress Notes (Signed)
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 SURGICAL PROCEDURE:   1.  Bronchoscopy.   2.  Transhiatal total esophagectomy with cervical esophagogastrostomy.   3.  Pyloroplasty.   4.  Feeding jejunostomy.   5.  Placement of left chest tube   Cancer Staging Esophageal cancer [facility]) Staging form: Esophagus - Adenocarcinoma, AJCC 8th Edition - Clinical stage from 09/13/2019: Stage IVA (cT3, cN2, cM0) - Signed by [name], MD on 09/22/2019 - Pathologic stage from 01/07/2020: Stage I (ypT0, pN0, cM0, G2) - Signed by [name], MD on 01/07/2020   History of Present Illness:     Patient returns office today in follow-up after his recent transhiatal total esophagectomy with cervical esophagogastrostomy pyloroplasty and feeding jejunostomy tube.  He was initially staged as a T3N2 lesion of the distal esophagus, pathologic Stage I  at the time of resection after preoperative chemo and radiation .    On October 1 he underwent repeat upper GI endoscopy with dilatation by Dr. [name].  At the time of dilatation he was noted to have a benign-appearing stricture at the cervical anastomosis, this was dilated without difficulty.  Patient notes immediate improvement in swallowing.  He is to have a repeat dilatation October 15.  Wt Readings from Last 3 Encounters:  03/26/20 145 lb 12.8 oz (66.1 kg)  03/20/20 145 lb (65.8 kg)  03/16/20 147 lb 3.2 oz (66.8 kg)       Zubrod Score: At the time of surgery this  patient's most appropriate activity status/level should be described as: []     0    Normal activity, no symptoms [x]     1    Restricted in physical strenuous activity but ambulatory, able to do out light work []     2    Ambulatory and capable of self care, unable to do work activities, up and about                 >50 % of waking hours                                                                                   []     3    Only limited self care, in bed greater than 50% of waking hours []     4    Completely disabled, no self care, confined to bed or chair []     5    Moribund  Social History   Tobacco Use  Smoking Status Former Smoker  . Types: Cigarettes  . Quit date: 03/21/2019  . Years since quitting: 1.0  Smokeless Tobacco Never Used  No Known Allergies  Current Outpatient Medications  Medication Sig Dispense Refill  . albuterol (VENTOLIN HFA) 108 (90 Base) MCG/ACT inhaler TAKE 2 PUFFS BY MOUTH EVERY 6 HOURS AS NEEDED FOR WHEEZE (Patient taking differently: Inhale 2 puffs into the lungs every 6 (six) hours as needed for wheezing. ) 18 g 0  . feeding supplement, GLUCERNA SHAKE, (GLUCERNA SHAKE) LIQD Take 237 mLs by mouth 3 (three) times daily between meals. 5000 mL prn  . metFORMIN (GLUCOPHAGE) 500 MG tablet Take 1 tablet (500 mg total) by mouth 2 (two) times daily with a meal. (Patient taking differently: Take 1,000 mg by mouth daily with breakfast. ) 180 tablet 1  . Multiple Vitamins-Minerals (MULTIVITAMIN WITH MINERALS) tablet Take 1 tablet by mouth daily.    . sucralfate (CARAFATE) 1 g tablet Take 1 tablet (1 g total) by mouth 4 (four) times daily. Dissolve each tablet in 15 cc water before use. (Patient taking differently: Take 1 g by mouth 3 (three) times daily with meals. Dissolve each tablet in 15 cc water before use.) 120 tablet 2   No current facility-administered medications for this visit.       Physical Exam: BP 111/63 (BP Location: Right Arm, Patient  Position: Sitting, Cuff Size: Normal)   Pulse 91   Temp (!) 97.5 F (36.4 C) (Skin)   Resp 18   Ht 5\' 7"  (1.702 m)   Wt 145 lb 12.8 oz (66.1 kg)   SpO2 97% Comment: RA  BMI 22.84 kg/m  General appearance: alert, cooperative and no distress Neck: no adenopathy, no carotid bruit, no JVD, supple, symmetrical, trachea midline and thyroid not enlarged, symmetric, no tenderness/mass/nodules Lymph nodes: Cervical, supraclavicular, and axillary nodes normal. Resp: clear to auscultation bilaterally Cardio: regular rate and rhythm, S1, S2 normal, no murmur, click, rub or gallop GI: soft, non-tender; bowel sounds normal; no masses,  no organomegaly Extremities: extremities normal, atraumatic, no cyanosis or edema and Homans sign is negative, no sign of DVT Neurologic: Grossly normal   Diagnostic Studies & Laboratory data:         Recent Radiology Findings:  Recent Labs: Lab Results  Component Value Date   WBC 4.6 03/17/2020   HGB 12.2 (L) 03/17/2020   HCT 37.6 (L) 03/17/2020   PLT 179 03/17/2020   GLUCOSE 108 (H) 03/17/2020   CHOL 159 03/17/2020   TRIG 147 03/17/2020   HDL 30 (L) 03/17/2020   LDLCALC 103 03/17/2020   ALT 23 03/17/2020   AST 19 03/17/2020   NA 138 03/17/2020   K 4.4 03/17/2020   CL 103 03/17/2020   CREATININE 0.95 03/17/2020   BUN 10 03/17/2020   CO2 32 03/17/2020   INR 0.9 12/30/2019   HGBA1C 7.5 (A) 03/16/2020    Wt Readings from Last 3 Encounters:  03/26/20 145 lb 12.8 oz (66.1 kg)  03/20/20 145 lb (65.8 kg)  03/16/20 147 lb 3.2 oz (66.8 kg)    Assessment / Plan:   #1 status post transhiatal total esophagectomy with cervical esophagogastrostomy and feeding jejunostomy-pathologic stage I after preoperative radiation chemotherapy.- #2 benign cervical anastomotic stricture, dilated with good result on October 1 follow-up evaluation on October 15 and possible repeat dilatation-I explained to the patient the best outcome with strictures early and frequent  dilatations until scar formation has stabilized.  We will plan to see the patient back in 2 weeks    Medication Changes: No orders of the defined types were placed in this encounter.   [name]  03/26/2020 9:47 AM

## 2020-04-01 ENCOUNTER — Other Ambulatory Visit (HOSPITAL_COMMUNITY)
Admission: RE | Admit: 2020-04-01 | Discharge: 2020-04-01 | Disposition: A | Discharge status: Home / Self Care | Payer: PPO | Source: Ambulatory Visit | Attending: Gastroenterology | Admitting: Gastroenterology

## 2020-04-01 DIAGNOSIS — Z01812 Encounter for preprocedural laboratory examination: Secondary | ICD-10-CM | POA: Insufficient documentation

## 2020-04-01 DIAGNOSIS — Z20822 Contact with and (suspected) exposure to covid-19: Secondary | ICD-10-CM | POA: Diagnosis not present

## 2020-04-01 LAB — SARS CORONAVIRUS 2 (TAT 6-24 HRS): SARS Coronavirus 2: NEGATIVE

## 2020-04-03 ENCOUNTER — Encounter (HOSPITAL_COMMUNITY)
Admission: RE | Disposition: A | Discharge status: Home / Self Care | Payer: Self-pay | Source: Home / Self Care | Attending: Gastroenterology

## 2020-04-03 ENCOUNTER — Ambulatory Visit (HOSPITAL_COMMUNITY): Payer: PPO | Admitting: Certified Registered Nurse Anesthetist

## 2020-04-03 ENCOUNTER — Ambulatory Visit (HOSPITAL_COMMUNITY)
Admission: RE | Admit: 2020-04-03 | Discharge: 2020-04-03 | Disposition: A | Discharge status: Home / Self Care | Payer: PPO | Attending: Gastroenterology | Admitting: Gastroenterology

## 2020-04-03 ENCOUNTER — Encounter (HOSPITAL_COMMUNITY): Payer: Self-pay | Admitting: Gastroenterology

## 2020-04-03 DIAGNOSIS — Z8249 Family history of ischemic heart disease and other diseases of the circulatory system: Secondary | ICD-10-CM | POA: Insufficient documentation

## 2020-04-03 DIAGNOSIS — I712 Thoracic aortic aneurysm, without rupture: Secondary | ICD-10-CM | POA: Insufficient documentation

## 2020-04-03 DIAGNOSIS — Z825 Family history of asthma and other chronic lower respiratory diseases: Secondary | ICD-10-CM | POA: Diagnosis not present

## 2020-04-03 DIAGNOSIS — Z980 Intestinal bypass and anastomosis status: Secondary | ICD-10-CM | POA: Diagnosis not present

## 2020-04-03 DIAGNOSIS — D649 Anemia, unspecified: Secondary | ICD-10-CM | POA: Diagnosis not present

## 2020-04-03 DIAGNOSIS — E119 Type 2 diabetes mellitus without complications: Secondary | ICD-10-CM | POA: Insufficient documentation

## 2020-04-03 DIAGNOSIS — I2510 Atherosclerotic heart disease of native coronary artery without angina pectoris: Secondary | ICD-10-CM | POA: Insufficient documentation

## 2020-04-03 DIAGNOSIS — Z87891 Personal history of nicotine dependence: Secondary | ICD-10-CM | POA: Insufficient documentation

## 2020-04-03 DIAGNOSIS — E785 Hyperlipidemia, unspecified: Secondary | ICD-10-CM | POA: Insufficient documentation

## 2020-04-03 DIAGNOSIS — Z86010 Personal history of colonic polyps: Secondary | ICD-10-CM | POA: Diagnosis not present

## 2020-04-03 DIAGNOSIS — K222 Esophageal obstruction: Secondary | ICD-10-CM | POA: Insufficient documentation

## 2020-04-03 DIAGNOSIS — I10 Essential (primary) hypertension: Secondary | ICD-10-CM | POA: Insufficient documentation

## 2020-04-03 DIAGNOSIS — J4520 Mild intermittent asthma, uncomplicated: Secondary | ICD-10-CM | POA: Diagnosis not present

## 2020-04-03 DIAGNOSIS — Z8501 Personal history of malignant neoplasm of esophagus: Secondary | ICD-10-CM | POA: Insufficient documentation

## 2020-04-03 DIAGNOSIS — Z9049 Acquired absence of other specified parts of digestive tract: Secondary | ICD-10-CM | POA: Diagnosis not present

## 2020-04-03 DIAGNOSIS — R1310 Dysphagia, unspecified: Secondary | ICD-10-CM | POA: Insufficient documentation

## 2020-04-03 DIAGNOSIS — K219 Gastro-esophageal reflux disease without esophagitis: Secondary | ICD-10-CM | POA: Insufficient documentation

## 2020-04-03 DIAGNOSIS — Z7984 Long term (current) use of oral hypoglycemic drugs: Secondary | ICD-10-CM | POA: Diagnosis not present

## 2020-04-03 HISTORY — PX: BALLOON DILATION: SHX5330

## 2020-04-03 HISTORY — PX: ESOPHAGOGASTRODUODENOSCOPY (EGD) WITH PROPOFOL: SHX5813

## 2020-04-03 LAB — GLUCOSE, CAPILLARY: Glucose-Capillary: 122 mg/dL — ABNORMAL HIGH (ref 70–99)

## 2020-04-03 SURGERY — ESOPHAGOGASTRODUODENOSCOPY (EGD) WITH PROPOFOL
Anesthesia: Monitor Anesthesia Care

## 2020-04-03 MED ORDER — LACTATED RINGERS IV SOLN: INTRAVENOUS | Status: DC

## 2020-04-03 MED ORDER — PROPOFOL 500 MG/50ML IV EMUL
INTRAVENOUS | Status: DC | PRN
  Administered 2020-04-03: 20 mg via INTRAVENOUS
  Administered 2020-04-03: 30 mg via INTRAVENOUS
  Administered 2020-04-03: 150 ug/kg/min via INTRAVENOUS

## 2020-04-03 MED ORDER — LIDOCAINE VISCOUS HCL 2 % MT SOLN
OROMUCOSAL | Status: AC
  Filled 2020-04-03: qty 15

## 2020-04-03 MED ORDER — LIDOCAINE VISCOUS HCL 2 % MT SOLN
15.0000 mL | Freq: Once | OROMUCOSAL | Status: AC
  Administered 2020-04-03: 15 mL via OROMUCOSAL

## 2020-04-03 MED ORDER — SODIUM CHLORIDE 0.9 % IV SOLN: INTRAVENOUS | Status: DC

## 2020-04-03 MED ORDER — PHENYLEPHRINE HCL (PRESSORS) 10 MG/ML IV SOLN
INTRAVENOUS | Status: DC | PRN
  Administered 2020-04-03: 80 ug via INTRAVENOUS

## 2020-04-03 SURGICAL SUPPLY — 15 items

## 2020-04-03 NOTE — Anesthesia Procedure Notes (Signed)
Procedure Name: MAC Date/Time: 04/03/2020 10:41 AM Performed by: Raenette Rover, CRNA Pre-anesthesia Checklist: Patient identified, Emergency Drugs available, Suction available and Patient being monitored Patient Re-evaluated:Patient Re-evaluated prior to induction Oxygen Delivery Method: Simple face mask

## 2020-04-03 NOTE — Anesthesia Preprocedure Evaluation (Signed)
Anesthesia Evaluation  Patient identified by MRN, date of birth, ID band Patient awake    Reviewed: Allergy & Precautions, NPO status , Patient's Chart, lab work & pertinent test results  Airway Mallampati: III  TM Distance: >3 FB Neck ROM: Full    Dental  (+) Dental Advisory Given, Missing,    Pulmonary asthma , former smoker,    breath sounds clear to auscultation       Cardiovascular hypertension, Pt. on medications + CAD  Normal cardiovascular exam Rhythm:Regular Rate:Normal     Neuro/Psych negative neurological ROS  negative psych ROS   GI/Hepatic Neg liver ROS, GERD  Medicated and Controlled,Dysphagia Hx/o esophageal Ca S/P esophagectomy Hx/o diverticulosis   Endo/Other  diabetes, Well Controlled, Type 2, Oral Hypoglycemic AgentsHyperlipidemia  Renal/GU negative Renal ROS   ED negative genitourinary   Musculoskeletal negative musculoskeletal ROS (+)   Abdominal Normal abdominal exam  (+)   Peds  Hematology negative hematology ROS (+) anemia ,   Anesthesia Other Findings   Reproductive/Obstetrics                             Anesthesia Physical  Anesthesia Plan  ASA: III  Anesthesia Plan: MAC   Post-op Pain Management:    Induction: Intravenous  PONV Risk Score and Plan: 1 and Propofol infusion and TIVA  Airway Management Planned: Natural Airway and Mask  Additional Equipment: None  Intra-op Plan:   Post-operative Plan:   Informed Consent: I have reviewed the patients History and Physical, chart, labs and discussed the procedure including the risks, benefits and alternatives for the proposed anesthesia with the patient or authorized representative who has indicated his/her understanding and acceptance.       Plan Discussed with: CRNA  Anesthesia Plan Comments:         Anesthesia Quick Evaluation

## 2020-04-03 NOTE — Discharge Instructions (Signed)
Upper Endoscopy, Adult, Care After  This sheet gives you information about how to care for yourself after your procedure. Your health care provider may also give you more specific instructions. If you have problems or questions, contact your health care provider.  What can I expect after the procedure?  After the procedure, it is common to have:  · A sore throat.  · Mild stomach pain or discomfort.  · Bloating.  · Nausea.  Follow these instructions at home:    · Follow instructions from your health care provider about what to eat or drink after your procedure.  · Return to your normal activities as told by your health care provider. Ask your health care provider what activities are safe for you.  · Take over-the-counter and prescription medicines only as told by your health care provider.  · Do not drive for 24 hours if you were given a sedative during your procedure.  · Keep all follow-up visits as told by your health care provider. This is important.  Contact a health care provider if you have:  · A sore throat that lasts longer than one day.  · Trouble swallowing.  Get help right away if:  · You vomit blood or your vomit looks like coffee grounds.  · You have:  ? A fever.  ? Bloody, black, or tarry stools.  ? A severe sore throat or you cannot swallow.  ? Difficulty breathing.  ? Severe pain in your chest or abdomen.  Summary  · After the procedure, it is common to have a sore throat, mild stomach discomfort, bloating, and nausea.  · Do not drive for 24 hours if you were given a sedative during the procedure.  · Follow instructions from your health care provider about what to eat or drink after your procedure.  · Return to your normal activities as told by your health care provider.  This information is not intended to replace advice given to you by your health care provider. Make sure you discuss any questions you have with your health care provider.  Document Revised: 11/28/2017 Document Reviewed:  11/06/2017  Elsevier Patient Education © 2020 [employer]

## 2020-04-03 NOTE — Transfer of Care (Signed)
Immediate Anesthesia Transfer of Care Note  Patient: James Dunn  Procedure(s) Performed: ESOPHAGOGASTRODUODENOSCOPY (EGD) WITH PROPOFOL (N/A ) BALLOON DILATION (N/A )  Patient Location: Endoscopy Unit  Anesthesia Type:MAC  Level of Consciousness: awake, drowsy and patient cooperative  Airway & Oxygen Therapy: Patient Spontanous Breathing and Patient connected to face mask oxygen  Post-op Assessment: Report given to RN and Post -op Vital signs reviewed and stable  Post vital signs: Reviewed and stable  Last Vitals:  Vitals Value Taken Time  BP 100/51 04/03/20 1109  Temp    Pulse 62 04/03/20 1111  Resp 17 04/03/20 1111  SpO2 100 % 04/03/20 1111  Vitals shown include unvalidated device data.  Last Pain:  Vitals:   04/03/20 0857  TempSrc: Axillary  PainSc: 0-No pain         Complications: No complications documented.

## 2020-04-03 NOTE — H&P (View-Only) (Signed)
James Dunn HPI: The patient is here for a repeat dilation of his esophago-gastric anastomotic stricture. He was successfully dilated up to 10 mm two weeks ago and he reports that he is able to swallow well.   Past Medical History:  Diagnosis Date  . Allergy   . Asthma   . Controlled type 2 diabetes mellitus without complication, without long-term current use of insulin ([place]) 06/25/2012  . Diverticulosis   . ED (erectile dysfunction) 12/28/2010  . Esophageal cancer ([place]) 09/22/2019  . Former smoker 06/25/2012  . GERD (gastroesophageal reflux disease) 2002  . History of colonic polyps 05/11/2016  . Hyperlipidemia associated with type 2 diabetes mellitus ([place]) 05/11/2016  . Hypertension associated with diabetes ([place]) 08/02/2017  . Mild intermittent asthma without complication 1/63/8466  . Seasonal allergic rhinitis due to pollen 11/29/2016  . Thoracic aortic aneurysm ([facility])    Ascending thoracic aorta measures 4 cm diameter  . Tubular adenoma of colon 12/05/2016    Past Surgical History:  Procedure Laterality Date  . BALLOON DILATION N/A 03/20/2020   Procedure: BALLOON DILATION;  Surgeon: [name], MD;  Location: [facility];  Service: Endoscopy;  Laterality: N/A;  . BIOPSY  08/30/2019   Procedure: BIOPSY;  Surgeon: [name], MD;  Location: [facility];  Service: Endoscopy;;  . CHEST TUBE INSERTION Left 01/01/2020   Procedure: CHEST TUBE INSERTION;  Surgeon: [name], MD;  Location: [facility];  Service: Thoracic;  Laterality: Left;  . COMPLETE ESOPHAGECTOMY N/A 01/01/2020   Procedure: TRANSHIATAL TOTAL ESOPHAGECTOMY COMPLETE WITH NIMS TUBE;  Surgeon: [name], MD;  Location: [facility] OR;  Service: Thoracic;  Laterality: N/A;  . ESOPHAGOGASTRODUODENOSCOPY (EGD) WITH PROPOFOL N/A 08/30/2019   Procedure: ESOPHAGOGASTRODUODENOSCOPY (EGD) WITH PROPOFOL;  Surgeon: [name], MD;  [facility];  Service: Endoscopy;  Laterality: N/A;  . ESOPHAGOGASTRODUODENOSCOPY  (EGD) WITH PROPOFOL N/A 09/13/2019   Procedure: ESOPHAGOGASTRODUODENOSCOPY (EGD) WITH PROPOFOL;  Surgeon: [name], MD;  [facility];  Service: Endoscopy;  Laterality: N/A;  . ESOPHAGOGASTRODUODENOSCOPY (EGD) WITH PROPOFOL N/A 03/20/2020   Procedure: ESOPHAGOGASTRODUODENOSCOPY (EGD) WITH PROPOFOL;  Surgeon: [name], MD;  [facility]: [facility];  Service: Endoscopy;  Laterality: N/A;  . JEJUNOSTOMY N/A 01/01/2020   Procedure: [name];  Surgeon: [name], MD;  Location: [facility];  Service: Thoracic;  Laterality: N/A;  . SKIN BIOPSY N/A 03/04/2019   upper back Nerofibroma and follicular cyst   . SUBMUCOSAL INJECTION  08/30/2019   Procedure: SUBMUCOSAL INJECTION;  Surgeon: [name], MD;  [facility];  Service: Endoscopy;;  . UPPER ESOPHAGEAL ENDOSCOPIC ULTRASOUND (EUS) N/A 09/13/2019   Procedure: UPPER ESOPHAGEAL ENDOSCOPIC ULTRASOUND (EUS);  Surgeon: [name], MD;  Location: [facility];  Service: Endoscopy;  Laterality: N/A;  . VIDEO BRONCHOSCOPY N/A 01/01/2020   Procedure: VIDEO BRONCHOSCOPY WITH BRONCHIAL WASHING FOR CULTURES AND GRAM STAIN;  Surgeon: [name], MD;  Location: [facility] OR;  Service: Thoracic;  Laterality: N/A;    Family History  Problem Relation Age of Onset  . Emphysema Mother   . AAA (abdominal aortic aneurysm) Father     Social History:  reports that he quit smoking about a year ago. His smoking use included cigarettes. He has never used smokeless tobacco. He reports previous alcohol use of about 7.0 standard drinks of alcohol per week. He reports that he does not use drugs.  Allergies: No Known Allergies  Medications:  Scheduled:  Continuous: . sodium chloride      No results found for this  or any previous visit (from the past 24 hour(s)).   No results found.  ROS:  As stated above in the HPI otherwise negative.  Blood pressure 107/66, pulse 67, temperature (!) 97.5 F (36.4 C), temperature source  Axillary, resp. rate 12, height 5\' 7"  (1.702 m), weight 65.8 kg, SpO2 100 %.    PE: Gen: NAD, Alert and Oriented HEENT:  [place]/AT, EOMI Neck: Supple, no LAD, surgical scar Lungs: CTA Bilaterally CV: RRR without M/G/R ABD: Soft, NTND, +BS Ext: No C/C/E  Assessment/Plan: 1) Esophago-gastric anastomotic stricture - EGD with dilation.  James Dunn D 04/03/2020, 9:01 AM

## 2020-04-03 NOTE — Op Note (Signed)
[facility] Patient Name: James Dunn Procedure Date: 04/03/2020 MRN: [id number] Attending MD: [name] , MD Date of Birth: 25-Mar-1951 CSN: [id number] Age: 69 Admit Type: Outpatient Procedure:                Upper GI endoscopy Indications:              Therapeutic procedure Providers:                [name], MD, Baird Cancer, RN, Cherylynn Ridges,                            Technician, Hedy Camara CRNA Referring MD:              Medicines:                Propofol per Anesthesia Complications:            No immediate complications. Estimated Blood Loss:     Estimated blood loss was minimal. Procedure:                Pre-Anesthesia Assessment:                           - Prior to the procedure, a History and Physical                            was performed, and patient medications and                            allergies were reviewed. The patient's tolerance of                            previous anesthesia was also reviewed. The risks                            and benefits of the procedure and the sedation                            options and risks were discussed with the patient.                            All questions were answered, and informed consent                            was obtained. Prior Anticoagulants: The patient has                            taken no previous anticoagulant or antiplatelet                            agents. ASA Grade Assessment: II - A patient with                            mild systemic disease. After reviewing the risks  and benefits, the patient was deemed in                            satisfactory condition to undergo the procedure.                           - Sedation was administered by an anesthesia                            professional. Deep sedation was attained.                           After obtaining informed consent, the endoscope was                            passed under  direct vision. Throughout the                            procedure, the patient's blood pressure, pulse, and                            oxygen saturations were monitored continuously. The                            GIF-H190 ([id number]) was introduced through the                            mouth, and advanced to the second part of duodenum.                            The upper GI endoscopy was accomplished without                            difficulty. The patient tolerated the procedure                            well. Scope In: Scope Out: Findings:      One benign-appearing, intrinsic severe stenosis was found at the       esophageal anastomosis. This stenosis measured 9 mm (inner diameter) x 1       cm (in length). The stenosis was traversed after dilation. A TTS dilator       was passed through the scope. Dilation with a 03-31-11 mm balloon       dilator was performed to 11 mm. The dilation site was examined and       showed complete resolution of luminal narrowing. Estimated blood loss       was minimal.      Evidence of a previous surgical anastomosis was found in the gastric       body.      The examined duodenum was normal.      The stricture was again encountered. There was evidence that the patency       was greater than the initial dilation, but the adult endoscope was not       able to pass through the area. Starting at 10 mm, the anastomotic  stricture was dilated and held in place for one minute. There was       evidence of some mild mucosal disruption, but the endoscope was not able       to pass through the stenosis. Dilation was then increased up to 11 mm       and held in place for another 1 minute. The mucosal disruption       increased. The increased mucosal disruption was significant and no       further dilation was performed. The endoscope was not able to be passed       through the area. With tip deflection of the endoscope, the stenosis was       traversed.  The gastric lumen showed retained fluids and all of the fluid       was suctioned. Some small food particles were noted in the fluid. No       other abnormalities were identified. Impression:               - Benign-appearing esophageal stenosis. Dilated.                           - A previous surgical anastomosis was found.                           - Normal examined duodenum.                           - No specimens collected. Moderate Sedation:      Not Applicable - Patient had care per Anesthesia. Recommendation:           - Patient has a contact number available for                            emergencies. The signs and symptoms of potential                            delayed complications were discussed with the                            patient. Return to normal activities tomorrow.                            Written discharge instructions were provided to the                            patient.                           - Resume previous diet.                           - Continue present medications.                           - Repeat upper endoscopy in 2 weeks for retreatment.                           - Small frequent meals. Procedure Code(s):        ---  Professional ---                           [phone], Esophagogastroduodenoscopy, flexible,                            transoral; with transendoscopic balloon dilation of                            esophagus (less than 30 mm diameter) Diagnosis Code(s):        --- Professional ---                           K22.2, Esophageal obstruction                           Z98.0, Intestinal bypass and anastomosis status CPT copyright 2019 American Medical Association. All rights reserved. The codes documented in this report are preliminary and upon coder review may  be revised to meet current compliance requirements. [name], MD [name], MD 04/03/2020 11:11:50 AM This report has been signed electronically. Number of Addenda: 0

## 2020-04-03 NOTE — Anesthesia Postprocedure Evaluation (Signed)
Anesthesia Post Note  Patient: James Dunn  Procedure(s) Performed: ESOPHAGOGASTRODUODENOSCOPY (EGD) WITH PROPOFOL (N/A ) BALLOON DILATION (N/A )     Patient location during evaluation: Endoscopy Anesthesia Type: MAC Level of consciousness: awake and sedated Pain management: pain level controlled Vital Signs Assessment: post-procedure vital signs reviewed and stable Respiratory status: spontaneous breathing Cardiovascular status: stable Postop Assessment: no apparent nausea or vomiting Anesthetic complications: no   No complications documented.  Last Vitals:  Vitals:   04/03/20 1105 04/03/20 1110  BP: (!) 80/46 (!) 96/48  Pulse: 70 60  Resp: 20 19  Temp:    SpO2: 100% 100%    Last Pain:  Vitals:   04/03/20 1110  TempSrc:   PainSc: 0-No pain   Pain Goal:                   Huston Foley

## 2020-04-03 NOTE — H&P (Signed)
James Dunn HPI: The patient is here for a repeat dilation of his esophago-gastric anastomotic stricture. He was successfully dilated up to 10 mm two weeks ago and he reports that he is able to swallow well.   Past Medical History:  Diagnosis Date  . Allergy   . Asthma   . Controlled type 2 diabetes mellitus without complication, without long-term current use of insulin (James Dunn) James Dunn  . Diverticulosis   . ED (erectile dysfunction) 12/28/2010  . Esophageal cancer (James Dunn) James Dunn  . Former smoker James Dunn  . GERD (gastroesophageal reflux disease) 2002  . History of colonic polyps James Dunn  . Hyperlipidemia associated with type 2 diabetes mellitus (James Dunn) James Dunn  . Hypertension associated with diabetes (James Dunn) James Dunn  . Mild intermittent asthma without complication James Dunn  . Seasonal allergic rhinitis due to pollen James Dunn  . Thoracic aortic aneurysm (James Dunn)    Ascending thoracic aorta measures 4 cm diameter  . Tubular adenoma of colon 12/05/2016    Past Surgical History:  Procedure Laterality Date  . BALLOON DILATION N/A 03/20/2020   Procedure: BALLOON DILATION;  Surgeon: [name], MD;  Location: [facility];  Service: Endoscopy;  Laterality: N/A;  . BIOPSY  08/30/2019   Procedure: BIOPSY;  Surgeon: [name], MD;  [facility];  Service: Endoscopy;;  . CHEST TUBE INSERTION Left 01/01/2020   Procedure: CHEST TUBE INSERTION;  Surgeon: [name], MD;  Location: [facility];  Service: Thoracic;  Laterality: Left;  . COMPLETE ESOPHAGECTOMY N/A 01/01/2020   Procedure: TRANSHIATAL TOTAL ESOPHAGECTOMY COMPLETE WITH NIMS TUBE;  Surgeon: [name], MD;  Location: [facility] OR;  Service: Thoracic;  Laterality: N/A;  . ESOPHAGOGASTRODUODENOSCOPY (EGD) WITH PROPOFOL N/A 08/30/2019   Procedure: ESOPHAGOGASTRODUODENOSCOPY (EGD) WITH PROPOFOL;  Surgeon: [name], MD;  [facility];  Service: Endoscopy;  Laterality: N/A;  . ESOPHAGOGASTRODUODENOSCOPY  (EGD) WITH PROPOFOL N/A 09/13/2019   Procedure: ESOPHAGOGASTRODUODENOSCOPY (EGD) WITH PROPOFOL;  Surgeon: [name], MD;  [facility];  Service: Endoscopy;  Laterality: N/A;  . ESOPHAGOGASTRODUODENOSCOPY (EGD) WITH PROPOFOL N/A 03/20/2020   Procedure: ESOPHAGOGASTRODUODENOSCOPY (EGD) WITH PROPOFOL;  Surgeon: [name], MD;  [facility];  Service: Endoscopy;  Laterality: N/A;  . JEJUNOSTOMY N/A 01/01/2020   Procedure: [name];  Surgeon: [name], MD;  Location: [facility];  Service: Thoracic;  Laterality: N/A;  . SKIN BIOPSY N/A 03/04/2019   upper back Nerofibroma and follicular cyst   . SUBMUCOSAL INJECTION  08/30/2019   Procedure: SUBMUCOSAL INJECTION;  Surgeon: [name], MD;  [facility]: [facility];  Service: Endoscopy;;  . UPPER ESOPHAGEAL ENDOSCOPIC ULTRASOUND (EUS) N/A 09/13/2019   Procedure: UPPER ESOPHAGEAL ENDOSCOPIC ULTRASOUND (EUS);  Surgeon: [name], MD;  Location: [facility];  Service: Endoscopy;  Laterality: N/A;  . VIDEO BRONCHOSCOPY N/A 01/01/2020   Procedure: VIDEO BRONCHOSCOPY WITH BRONCHIAL WASHING FOR CULTURES AND GRAM STAIN;  Surgeon: [name], MD;  Location: [facility] OR;  Service: Thoracic;  Laterality: N/A;    Family History  Problem Relation Age of Onset  . Emphysema Mother   . AAA (abdominal aortic aneurysm) Father     Social History:  reports that he quit smoking about a year ago. His smoking use included cigarettes. He has never used smokeless tobacco. He reports previous alcohol use of about 7.0 standard drinks of alcohol per week. He reports that he does not use drugs.  Allergies: No Known Allergies  Medications:  Scheduled:  Continuous: . sodium chloride      No results found for this  or any previous visit (from the past 24 hour(s)).   No results found.  ROS:  As stated above in the HPI otherwise negative.  Blood pressure 107/66, pulse 67, temperature (!) 97.5 F (36.4 C), temperature source  Axillary, resp. rate 12, height 5\' 7"  (1.702 m), weight 65.8 kg, SpO2 100 %.    PE: Gen: NAD, Alert and Oriented HEENT:  [name]/AT, EOMI Neck: Supple, no LAD, surgical scar Lungs: CTA Bilaterally CV: RRR without M/G/R ABD: Soft, NTND, +BS Ext: No C/C/E  Assessment/Plan: 1) Esophago-gastric anastomotic stricture - EGD with dilation.  James Dunn D 04/03/2020, 9:01 AM

## 2020-04-06 ENCOUNTER — Encounter (HOSPITAL_COMMUNITY): Payer: Self-pay | Admitting: Gastroenterology

## 2020-04-06 ENCOUNTER — Other Ambulatory Visit: Payer: Self-pay | Admitting: Gastroenterology

## 2020-04-06 NOTE — Progress Notes (Signed)
Post procedure call made. Reached generic voicemail - no message left.

## 2020-04-09 ENCOUNTER — Ambulatory Visit (INDEPENDENT_AMBULATORY_CARE_PROVIDER_SITE_OTHER): Payer: PPO | Admitting: Cardiothoracic Surgery

## 2020-04-09 ENCOUNTER — Ambulatory Visit: Payer: PPO | Admitting: Cardiothoracic Surgery

## 2020-04-09 VITALS — BP 101/65 | HR 75 | Resp 20 | Ht 67.0 in | Wt 147.8 lb

## 2020-04-09 DIAGNOSIS — C155 Malignant neoplasm of lower third of esophagus: Secondary | ICD-10-CM | POA: Diagnosis not present

## 2020-04-09 DIAGNOSIS — Z09 Encounter for follow-up examination after completed treatment for conditions other than malignant neoplasm: Secondary | ICD-10-CM | POA: Diagnosis not present

## 2020-04-09 NOTE — Progress Notes (Signed)
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 SURGICAL PROCEDURE:   1.  Bronchoscopy.   2.  Transhiatal total esophagectomy with cervical esophagogastrostomy.   3.  Pyloroplasty.   4.  Feeding jejunostomy.   5.  Placement of left chest tube   Cancer Staging Esophageal cancer [facility]) Staging form: Esophagus - Adenocarcinoma, AJCC 8th Edition - Clinical stage from 09/13/2019: Stage IVA (cT3, cN2, cM0) - Signed by [name], MD on 09/22/2019 - Pathologic stage from 01/07/2020: Stage I (ypT0, pN0, cM0, G2) - Signed by [name], MD on 01/07/2020   History of Present Illness:     Patient returns office today in follow-up after transhiatal total esophagectomy with cervical esophagogastrostomy pyloroplasty and feeding jejunostomy tube.  He was initially staged as a T3N2 lesion of the distal esophagus, pathologic Stage I  at the time of resection after preoperative chemo and radiation .    On October 1 he underwent repeat upper GI endoscopy with dilatation by Dr. [name].  Repeat esophageal dilatation was done October 15, at that time the cervical anastomosis was more open than it had been but still did not admit an adult endoscope, repeat dilatation was done.  Patient notes that he is able to take solid food without difficulty, did have some trouble with Metformin pill.  He still notes early satiety.  He is back playing 18 holes of golf.  Wt Readings from Last 3 Encounters:  04/09/20 147 lb 12.8 oz (67 kg)   04/03/20 145 lb (65.8 kg)  03/26/20 145 lb 12.8 oz (66.1 kg)       Zubrod Score: At the time of surgery this patient's most appropriate activity status/level should be described as: []     0    Normal activity, no symptoms [x]     1    Restricted in physical strenuous activity but ambulatory, able to do out light work []     2    Ambulatory and capable of self care, unable to do work activities, up and about                 >50 % of waking hours                                                                                   []     3    Only limited self care, in bed greater than 50% of waking hours []     4    Completely disabled, no self care, confined to bed or chair []     5    Moribund  Social History   Tobacco  Use  Smoking Status Former Smoker  . Types: Cigarettes  . Quit date: 03/21/2019  . Years since quitting: 1.0  Smokeless Tobacco Never Used       No Known Allergies  Current Outpatient Medications  Medication Sig Dispense Refill  . albuterol (VENTOLIN HFA) 108 (90 Base) MCG/ACT inhaler TAKE 2 PUFFS BY MOUTH EVERY 6 HOURS AS NEEDED FOR WHEEZE (Patient taking differently: Inhale 2 puffs into the lungs every 6 (six) hours as needed for wheezing. ) 18 g 0  . feeding supplement, GLUCERNA SHAKE, (GLUCERNA SHAKE) LIQD Take 237 mLs by mouth 3 (three) times daily between meals. (Patient taking differently: Take 237 mLs by mouth See admin instructions. Up to 2 times daily) 5000 mL prn  . metFORMIN (GLUCOPHAGE) 500 MG tablet Take 1 tablet (500 mg total) by mouth 2 (two) times daily with a meal. (Patient taking differently: Take 1,000 mg by mouth daily with breakfast. ) 180 tablet 1  . Multiple Vitamins-Minerals (MULTIVITAMIN WITH MINERALS) tablet Take 1 tablet by mouth daily.    . sucralfate (CARAFATE) 1 g tablet Take 1 tablet (1 g total) by mouth 4 (four) times daily. Dissolve each tablet in 15 cc water before use. (Patient taking differently: Take 1 g by mouth 3 (three) times daily  with meals. Dissolve each tablet in 15 cc water before use.) 120 tablet 2   No current facility-administered medications for this visit.       Physical Exam: BP 101/65   Pulse 75   Resp 20   Ht 5\' 7"  (1.702 m)   Wt 147 lb 12.8 oz (67 kg)   SpO2 99% Comment: RA  BMI 23.15 kg/m  General appearance: alert, cooperative and no distress Neck: no adenopathy, no carotid bruit, no JVD, supple, symmetrical, trachea midline and thyroid not enlarged, symmetric, no tenderness/mass/nodules Lymph nodes: Cervical, supraclavicular, and axillary nodes normal. Resp: clear to auscultation bilaterally Cardio: regular rate and rhythm, S1, S2 normal, no murmur, click, rub or gallop GI: soft, non-tender; bowel sounds normal; no masses,  no organomegaly Extremities: extremities normal, atraumatic, no cyanosis or edema Neurologic: Grossly normal Abdominal and neck incisions are well-healed, there is no associated adenopathy no evidence of abdominal incisional hernia  Diagnostic Studies & Laboratory data:         Recent Radiology Findings:  Recent Labs: Lab Results  Component Value Date   WBC 4.6 03/17/2020   HGB 12.2 (L) 03/17/2020   HCT 37.6 (L) 03/17/2020   PLT 179 03/17/2020   GLUCOSE 108 (H) 03/17/2020   CHOL 159 03/17/2020   TRIG 147 03/17/2020   HDL 30 (L) 03/17/2020   LDLCALC 103 03/17/2020   ALT 23 03/17/2020   AST 19 03/17/2020   NA 138 03/17/2020   K 4.4 03/17/2020   CL 103 03/17/2020   CREATININE 0.95 03/17/2020   BUN 10 03/17/2020   CO2 32 03/17/2020   INR 0.9 12/30/2019   HGBA1C 7.5 (A) 03/16/2020    Wt Readings from Last 3 Encounters:  04/09/20 147 lb 12.8 oz (67 kg)  04/03/20 145 lb (65.8 kg)  03/26/20 145 lb 12.8 oz (66.1 kg)    Assessment / Plan:   #1 status post transhiatal total esophagectomy with cervical esophagogastrostomy and feeding jejunostomy-pathologic stage I after preoperative radiation chemotherapy.- #2 benign esophageal stricture cervical  anastomosis, has responded well to 2 dilatations may require several more for stable effect.  Patient to have postop CT of chest and abdomen in February I will plan to see  him back following the CT scan, early March   Medication Changes: No orders of the defined types were placed in this encounter.   [name] 04/09/2020 11:14 AM

## 2020-04-13 DIAGNOSIS — H04123 Dry eye syndrome of bilateral lacrimal glands: Secondary | ICD-10-CM | POA: Diagnosis not present

## 2020-04-13 DIAGNOSIS — H02834 Dermatochalasis of left upper eyelid: Secondary | ICD-10-CM | POA: Diagnosis not present

## 2020-04-13 DIAGNOSIS — H5703 Miosis: Secondary | ICD-10-CM | POA: Diagnosis not present

## 2020-04-13 DIAGNOSIS — H2513 Age-related nuclear cataract, bilateral: Secondary | ICD-10-CM | POA: Diagnosis not present

## 2020-04-13 DIAGNOSIS — H02831 Dermatochalasis of right upper eyelid: Secondary | ICD-10-CM | POA: Diagnosis not present

## 2020-04-13 DIAGNOSIS — E119 Type 2 diabetes mellitus without complications: Secondary | ICD-10-CM | POA: Diagnosis not present

## 2020-04-13 LAB — HM DIABETES EYE EXAM

## 2020-04-14 ENCOUNTER — Encounter: Payer: Self-pay | Admitting: Family Medicine

## 2020-04-15 ENCOUNTER — Other Ambulatory Visit (HOSPITAL_COMMUNITY)
Admission: RE | Admit: 2020-04-15 | Discharge: 2020-04-15 | Disposition: A | Discharge status: Home / Self Care | Payer: PPO | Source: Ambulatory Visit | Attending: Gastroenterology | Admitting: Gastroenterology

## 2020-04-15 DIAGNOSIS — Z20822 Contact with and (suspected) exposure to covid-19: Secondary | ICD-10-CM | POA: Insufficient documentation

## 2020-04-15 DIAGNOSIS — Z01812 Encounter for preprocedural laboratory examination: Secondary | ICD-10-CM | POA: Diagnosis not present

## 2020-04-15 LAB — SARS CORONAVIRUS 2 (TAT 6-24 HRS): SARS Coronavirus 2: NEGATIVE

## 2020-04-17 ENCOUNTER — Encounter (HOSPITAL_COMMUNITY)
Admission: RE | Disposition: A | Discharge status: Home / Self Care | Payer: Self-pay | Source: Home / Self Care | Attending: Gastroenterology

## 2020-04-17 ENCOUNTER — Other Ambulatory Visit: Payer: Self-pay

## 2020-04-17 ENCOUNTER — Ambulatory Visit (HOSPITAL_COMMUNITY): Payer: PPO | Admitting: Certified Registered"

## 2020-04-17 ENCOUNTER — Ambulatory Visit (HOSPITAL_COMMUNITY)
Admission: RE | Admit: 2020-04-17 | Discharge: 2020-04-17 | Disposition: A | Discharge status: Home / Self Care | Payer: PPO | Attending: Gastroenterology | Admitting: Gastroenterology

## 2020-04-17 DIAGNOSIS — K222 Esophageal obstruction: Secondary | ICD-10-CM | POA: Insufficient documentation

## 2020-04-17 DIAGNOSIS — I10 Essential (primary) hypertension: Secondary | ICD-10-CM | POA: Diagnosis not present

## 2020-04-17 DIAGNOSIS — R1310 Dysphagia, unspecified: Secondary | ICD-10-CM | POA: Diagnosis not present

## 2020-04-17 DIAGNOSIS — Z980 Intestinal bypass and anastomosis status: Secondary | ICD-10-CM | POA: Diagnosis not present

## 2020-04-17 DIAGNOSIS — E119 Type 2 diabetes mellitus without complications: Secondary | ICD-10-CM | POA: Diagnosis not present

## 2020-04-17 DIAGNOSIS — E785 Hyperlipidemia, unspecified: Secondary | ICD-10-CM | POA: Diagnosis not present

## 2020-04-17 DIAGNOSIS — K219 Gastro-esophageal reflux disease without esophagitis: Secondary | ICD-10-CM | POA: Diagnosis not present

## 2020-04-17 DIAGNOSIS — Z9049 Acquired absence of other specified parts of digestive tract: Secondary | ICD-10-CM | POA: Diagnosis not present

## 2020-04-17 HISTORY — PX: ESOPHAGOGASTRODUODENOSCOPY (EGD) WITH PROPOFOL: SHX5813

## 2020-04-17 HISTORY — PX: BALLOON DILATION: SHX5330

## 2020-04-17 LAB — GLUCOSE, CAPILLARY: Glucose-Capillary: 103 mg/dL — ABNORMAL HIGH (ref 70–99)

## 2020-04-17 SURGERY — ESOPHAGOGASTRODUODENOSCOPY (EGD) WITH PROPOFOL
Anesthesia: Monitor Anesthesia Care

## 2020-04-17 MED ORDER — SODIUM CHLORIDE 0.9 % IV SOLN: INTRAVENOUS | Status: DC

## 2020-04-17 MED ORDER — PROPOFOL 500 MG/50ML IV EMUL
INTRAVENOUS | Status: DC | PRN
  Administered 2020-04-17: 135 ug/kg/min via INTRAVENOUS

## 2020-04-17 MED ORDER — LACTATED RINGERS IV SOLN: INTRAVENOUS | Status: DC

## 2020-04-17 MED ORDER — LIDOCAINE HCL (CARDIAC) PF 100 MG/5ML IV SOSY
PREFILLED_SYRINGE | INTRAVENOUS | Status: DC | PRN
  Administered 2020-04-17: 100 mg via INTRATRACHEAL

## 2020-04-17 MED ORDER — PROPOFOL 500 MG/50ML IV EMUL
INTRAVENOUS | Status: DC | PRN
  Administered 2020-04-17: 40 mg via INTRAVENOUS

## 2020-04-17 MED ORDER — GLYCOPYRROLATE 0.2 MG/ML IJ SOLN
INTRAMUSCULAR | Status: DC | PRN
  Administered 2020-04-17: .2 mg via INTRAVENOUS

## 2020-04-17 SURGICAL SUPPLY — 15 items

## 2020-04-17 NOTE — Anesthesia Postprocedure Evaluation (Signed)
Anesthesia Post Note  Patient: MARQUIES WANAT  Procedure(s) Performed: ESOPHAGOGASTRODUODENOSCOPY (EGD) WITH PROPOFOL (N/A ) BALLOON DILATION (N/A )     Patient location during evaluation: Endoscopy Anesthesia Type: MAC Level of consciousness: awake and alert Pain management: pain level controlled Vital Signs Assessment: post-procedure vital signs reviewed and stable Respiratory status: spontaneous breathing, nonlabored ventilation, respiratory function stable and patient connected to nasal cannula oxygen Cardiovascular status: stable and blood pressure returned to baseline Postop Assessment: no apparent nausea or vomiting Anesthetic complications: no   No complications documented.  Last Vitals:  Vitals:   04/17/20 0920 04/17/20 0930  BP: 101/61 (!) 113/59  Pulse: 81 79  Resp: 19 (!) 21  Temp: 36.4 C   SpO2: 100% 94%    Last Pain:  Vitals:   04/17/20 0930  TempSrc:   PainSc: 0-No pain                 Belenda Cruise P Elzy Tomasello

## 2020-04-17 NOTE — Interval H&P Note (Signed)
History and Physical Interval Note:  04/17/2020 8:41 AM  [name]  has presented today for surgery, with the diagnosis of dysphagia.  The various methods of treatment have been discussed with the patient and family. After consideration of risks, benefits and other options for treatment, the patient has consented to  Procedure(s): ESOPHAGOGASTRODUODENOSCOPY (EGD) WITH PROPOFOL (N/A) BALLOON DILATION (N/A) as a surgical intervention.  The patient's history has been reviewed, patient examined, no change in status, stable for surgery.  I have reviewed the patient's chart and labs.  Questions were answered to the patient's satisfaction.     James Dunn  The patient's swallowing is improved.  A repeat dilation with increasing diameter will be attempted.

## 2020-04-17 NOTE — Discharge Instructions (Signed)

## 2020-04-17 NOTE — Anesthesia Preprocedure Evaluation (Signed)
Anesthesia Evaluation  Patient identified by MRN, date of birth, ID band Patient awake    Reviewed: Patient's Chart, lab work & pertinent test results  Airway Mallampati: II  TM Distance: >3 FB Neck ROM: Full    Dental  (+) Teeth Intact   Pulmonary asthma , former smoker,    Pulmonary exam normal        Cardiovascular hypertension,  Rhythm:Regular Rate:Normal  Thoracic aneurysm   Neuro/Psych    GI/Hepatic Neg liver ROS, GERD  ,Esophageal cancer, diverticulosis, tubular adenoma colon   Endo/Other  diabetes, Well Controlled, Oral Hypoglycemic Agents  Renal/GU negative Renal ROS   ED    Musculoskeletal negative musculoskeletal ROS (+)   Abdominal (+)  Abdomen: soft. Bowel sounds: normal.  Peds  Hematology negative hematology ROS (+)   Anesthesia Other Findings   Reproductive/Obstetrics                             Anesthesia Physical Anesthesia Plan  ASA: III  Anesthesia Plan: MAC   Post-op Pain Management:    Induction:   PONV Risk Score and Plan: Propofol infusion and Treatment may vary due to age or medical condition  Airway Management Planned: Simple Face Mask and Nasal Cannula  Additional Equipment: None  Intra-op Plan:   Post-operative Plan:   Informed Consent: I have reviewed the patients History and Physical, chart, labs and discussed the procedure including the risks, benefits and alternatives for the proposed anesthesia with the patient or authorized representative who has indicated his/her understanding and acceptance.     Dental advisory given  Plan Discussed with: CRNA  Anesthesia Plan Comments: (Perfusion scan 07/21: Myocardial perfusion is normal.   The study is normal.   This is a low risk study.  Overall left ventricular systolic function was normal.    LV cavity size is normal.  Nuclear stress EF:  56%.  The left ventricular ejection fraction is normal  (55-65%). There is no prior study for comparison. Lab Results      Component                Value               Date                      WBC                      4.6                 03/17/2020                HGB                      12.2 (L)            03/17/2020                HCT                      37.6 (L)            03/17/2020                MCV                      85.1  03/17/2020                PLT                      179                 03/17/2020           )        Anesthesia Quick Evaluation

## 2020-04-17 NOTE — Transfer of Care (Signed)
Immediate Anesthesia Transfer of Care Note  Patient: James Dunn  Procedure(s) Performed: ESOPHAGOGASTRODUODENOSCOPY (EGD) WITH PROPOFOL (N/A ) BALLOON DILATION (N/A )  Patient Location: Endoscopy Unit  Anesthesia Type:MAC  Level of Consciousness: awake, oriented, patient cooperative and responds to stimulation  Airway & Oxygen Therapy: Patient Spontanous Breathing and Patient connected to face mask oxygen  Post-op Assessment: Report given to RN and Post -op Vital signs reviewed and stable  Post vital signs: Reviewed and stable  Last Vitals:  Vitals Value Taken Time  BP    Temp    Pulse    Resp    SpO2      Last Pain:  Vitals:   04/17/20 0831  TempSrc: Oral  PainSc: 0-No pain         Complications: No complications documented.

## 2020-04-17 NOTE — Op Note (Signed)
[facility] Patient Name: James Dunn Procedure Date: 04/17/2020 MRN: [id number] Attending MD: [name] , MD Date of Birth: 22-Oct-1950 CSN: [id number] Age: 69 Admit Type: Outpatient Procedure:                Upper GI endoscopy Indications:              Therapeutic procedure Providers:                [name], MD, Josie Dixon, RN, Particia Nearing, RN, Ladona Ridgel, Technician Referring MD:              Medicines:                Propofol per Anesthesia Complications:            No immediate complications. Estimated Blood Loss:     Estimated blood loss was minimal. Procedure:                Pre-Anesthesia Assessment:                           - Prior to the procedure, a History and Physical                            was performed, and patient medications and                            allergies were reviewed. The patient's tolerance of                            previous anesthesia was also reviewed. The risks                            and benefits of the procedure and the sedation                            options and risks were discussed with the patient.                            All questions were answered, and informed consent                            was obtained. Prior Anticoagulants: The patient has                            taken no previous anticoagulant or antiplatelet                            agents. ASA Grade Assessment: III - A patient with                            severe systemic disease. After reviewing the risks  and benefits, the patient was deemed in                            satisfactory condition to undergo the procedure.                           - Sedation was administered by an anesthesia                            professional. Deep sedation was attained.                           After obtaining informed consent, the endoscope was                            passed under  direct vision. Throughout the                            procedure, the patient's blood pressure, pulse, and                            oxygen saturations were monitored continuously. The                            GIF-H190 ([id number]) Olympus gastroscope was                            introduced through the mouth, and advanced to the                            second part of duodenum. The upper GI endoscopy was                            accomplished without difficulty. The patient                            tolerated the procedure well. Scope In: Scope Out: Findings:      One benign-appearing, intrinsic severe (stenosis; an endoscope cannot       pass) stenosis was found 20 cm from the incisors. This stenosis measured       1 cm (inner diameter) x less than one cm (in length). The stenosis was       traversed after dilation. A TTS dilator was passed through the scope.       Dilation with a 03-31-11 mm balloon dilator was performed to 12 mm. The       dilation site was examined and showed complete resolution of luminal       narrowing. Estimated blood loss was minimal.      A large amount of food (residue) was found in the gastric body.      Evidence of an esophago-gastric anastomosis were found in the gastric       body. This was characterized by healthy appearing mucosa.      The examined duodenum was normal.      The stenosis was again encountered at 20 cm and the adult endoscope was  not able to pass. The stenosis was the same patency as last examination.       The stenosis was dilated starting at 11 mm and held in place for 1       minute. This was the last dilation diameter. Viewing the mucosa through       the balloon at 11 mm did show any mucosal disruption. Deflation of the       balloon did show a mild mucosal disruption of the stricture. The       endoscope was not able to pass through the area. The stenosis was       dilated up to 12 mm and this dilation was held in place  for 1 minute.       After this dilation the endoscope was able to pass through the stenosis       with minor resistance. Examination of the mucosal disruption showed that       there was a greater disruption of the scarring. There was no evidence of       any crepitus during or immediately after the procedure. Impression:               - Benign-appearing esophageal stenosis. Dilated.                           - A large amount of food (residue) in the stomach.                           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                           - Normal examined duodenum.                           - No specimens collected. Moderate Sedation:      Not Applicable - Patient had care per Anesthesia. Recommendation:           - Patient has a contact number available for                            emergencies. The signs and symptoms of potential                            delayed complications were discussed with the                            patient. Return to normal activities tomorrow.                            Written discharge instructions were provided to the                            patient.                           - Resume previous diet.                           -  Continue present medications.                           - Repeat upper endoscopy in 2 weeks for retreatment. Procedure Code(s):        --- Professional ---                           [phone], Esophagogastroduodenoscopy, flexible,                            transoral; with transendoscopic balloon dilation of                            esophagus (less than 30 mm diameter) Diagnosis Code(s):        --- Professional ---                           K22.2, Esophageal obstruction CPT copyright 2019 American Medical Association. All rights reserved. The codes documented in this report are preliminary and upon coder review may  be revised to meet  current compliance requirements. [name], MD [name], MD 04/17/2020 9:19:26 AM This report has been signed electronically. Number of Addenda: 0

## 2020-04-19 ENCOUNTER — Encounter (HOSPITAL_COMMUNITY): Payer: Self-pay | Admitting: Gastroenterology

## 2020-04-20 ENCOUNTER — Other Ambulatory Visit: Payer: Self-pay | Admitting: Gastroenterology

## 2020-04-21 ENCOUNTER — Encounter (HOSPITAL_COMMUNITY): Payer: Self-pay | Admitting: Gastroenterology

## 2020-04-29 ENCOUNTER — Other Ambulatory Visit (HOSPITAL_COMMUNITY)
Admission: RE | Admit: 2020-04-29 | Discharge: 2020-04-29 | Disposition: A | Discharge status: Home / Self Care | Payer: PPO | Source: Ambulatory Visit | Attending: Gastroenterology | Admitting: Gastroenterology

## 2020-04-29 DIAGNOSIS — Z20822 Contact with and (suspected) exposure to covid-19: Secondary | ICD-10-CM | POA: Insufficient documentation

## 2020-04-29 DIAGNOSIS — Z01818 Encounter for other preprocedural examination: Secondary | ICD-10-CM | POA: Diagnosis not present

## 2020-04-29 LAB — SARS CORONAVIRUS 2 (TAT 6-24 HRS): SARS Coronavirus 2: NEGATIVE

## 2020-04-30 NOTE — Anesthesia Preprocedure Evaluation (Addendum)
Anesthesia Evaluation  Patient identified by MRN, date of birth, ID band Patient awake    Reviewed: Allergy & Precautions, NPO status , Patient's Chart, lab work & pertinent test results  Airway Mallampati: II  TM Distance: >3 FB Neck ROM: Full    Dental no notable dental hx. (+) Teeth Intact, Dental Advisory Given   Pulmonary asthma , former smoker,    Pulmonary exam normal breath sounds clear to auscultation       Cardiovascular hypertension, + Peripheral Vascular Disease (Thoracic aneurysm)  Normal cardiovascular exam Rhythm:Regular Rate:Normal     Neuro/Psych    GI/Hepatic Neg liver ROS, GERD  ,  Endo/Other  diabetes, Well Controlled, Oral Hypoglycemic Agents  Renal/GU      Musculoskeletal negative musculoskeletal ROS (+)   Abdominal   Peds  Hematology   Anesthesia Other Findings   Reproductive/Obstetrics                            Anesthesia Physical Anesthesia Plan  ASA: III  Anesthesia Plan: MAC   Post-op Pain Management:    Induction:   PONV Risk Score and Plan: Treatment may vary due to age or medical condition  Airway Management Planned: Nasal Cannula and Natural Airway  Additional Equipment: None  Intra-op Plan:   Post-operative Plan:   Informed Consent: I have reviewed the patients History and Physical, chart, labs and discussed the procedure including the risks, benefits and alternatives for the proposed anesthesia with the patient or authorized representative who has indicated his/her understanding and acceptance.     Dental advisory given  Plan Discussed with: CRNA and Anesthesiologist  Anesthesia Plan Comments: (EGD for Dysphagia)       Anesthesia Quick Evaluation

## 2020-05-01 ENCOUNTER — Other Ambulatory Visit: Payer: Self-pay

## 2020-05-01 ENCOUNTER — Ambulatory Visit (HOSPITAL_COMMUNITY): Payer: PPO | Admitting: Anesthesiology

## 2020-05-01 ENCOUNTER — Ambulatory Visit (HOSPITAL_COMMUNITY)
Admission: RE | Admit: 2020-05-01 | Discharge: 2020-05-01 | Disposition: A | Discharge status: Home / Self Care | Payer: PPO | Attending: Gastroenterology | Admitting: Gastroenterology

## 2020-05-01 ENCOUNTER — Encounter (HOSPITAL_COMMUNITY): Payer: Self-pay | Admitting: Gastroenterology

## 2020-05-01 ENCOUNTER — Encounter (HOSPITAL_COMMUNITY)
Admission: RE | Disposition: A | Discharge status: Home / Self Care | Payer: Self-pay | Source: Home / Self Care | Attending: Gastroenterology

## 2020-05-01 DIAGNOSIS — Z86010 Personal history of colonic polyps: Secondary | ICD-10-CM | POA: Diagnosis not present

## 2020-05-01 DIAGNOSIS — J4520 Mild intermittent asthma, uncomplicated: Secondary | ICD-10-CM | POA: Diagnosis not present

## 2020-05-01 DIAGNOSIS — Z8719 Personal history of other diseases of the digestive system: Secondary | ICD-10-CM | POA: Diagnosis not present

## 2020-05-01 DIAGNOSIS — Z87891 Personal history of nicotine dependence: Secondary | ICD-10-CM | POA: Insufficient documentation

## 2020-05-01 DIAGNOSIS — K222 Esophageal obstruction: Secondary | ICD-10-CM | POA: Diagnosis not present

## 2020-05-01 DIAGNOSIS — Z980 Intestinal bypass and anastomosis status: Secondary | ICD-10-CM | POA: Diagnosis not present

## 2020-05-01 DIAGNOSIS — R1310 Dysphagia, unspecified: Secondary | ICD-10-CM | POA: Diagnosis not present

## 2020-05-01 DIAGNOSIS — E119 Type 2 diabetes mellitus without complications: Secondary | ICD-10-CM | POA: Diagnosis not present

## 2020-05-01 DIAGNOSIS — K219 Gastro-esophageal reflux disease without esophagitis: Secondary | ICD-10-CM | POA: Diagnosis not present

## 2020-05-01 DIAGNOSIS — I10 Essential (primary) hypertension: Secondary | ICD-10-CM | POA: Diagnosis not present

## 2020-05-01 DIAGNOSIS — C159 Malignant neoplasm of esophagus, unspecified: Secondary | ICD-10-CM | POA: Diagnosis not present

## 2020-05-01 DIAGNOSIS — Z9049 Acquired absence of other specified parts of digestive tract: Secondary | ICD-10-CM | POA: Insufficient documentation

## 2020-05-01 HISTORY — PX: BALLOON DILATION: SHX5330

## 2020-05-01 HISTORY — PX: ESOPHAGOGASTRODUODENOSCOPY (EGD) WITH PROPOFOL: SHX5813

## 2020-05-01 LAB — GLUCOSE, CAPILLARY: Glucose-Capillary: 113 mg/dL — ABNORMAL HIGH (ref 70–99)

## 2020-05-01 SURGERY — ESOPHAGOGASTRODUODENOSCOPY (EGD) WITH PROPOFOL
Anesthesia: Monitor Anesthesia Care

## 2020-05-01 MED ORDER — LIDOCAINE 2% (20 MG/ML) 5 ML SYRINGE
INTRAMUSCULAR | Status: DC | PRN
  Administered 2020-05-01: 40 mg via INTRAVENOUS
  Administered 2020-05-01: 60 mg via INTRAVENOUS

## 2020-05-01 MED ORDER — PROPOFOL 10 MG/ML IV BOLUS
INTRAVENOUS | Status: DC | PRN
  Administered 2020-05-01: 20 mg via INTRAVENOUS

## 2020-05-01 MED ORDER — SODIUM CHLORIDE 0.9 % IV SOLN: INTRAVENOUS | Status: DC

## 2020-05-01 MED ORDER — PROPOFOL 500 MG/50ML IV EMUL
INTRAVENOUS | Status: DC | PRN
  Administered 2020-05-01: 100 ug/kg/min via INTRAVENOUS

## 2020-05-01 MED ORDER — LACTATED RINGERS IV SOLN
INTRAVENOUS | Status: DC
  Administered 2020-05-01: 1000 mL via INTRAVENOUS

## 2020-05-01 SURGICAL SUPPLY — 15 items

## 2020-05-01 NOTE — Discharge Instructions (Signed)

## 2020-05-01 NOTE — Transfer of Care (Signed)
Immediate Anesthesia Transfer of Care Note  Patient: James Dunn  Procedure(s) Performed: ESOPHAGOGASTRODUODENOSCOPY (EGD) WITH PROPOFOL (N/A ) BALLOON DILATION (N/A )  Patient Location: PACU  Anesthesia Type:MAC  Level of Consciousness: awake and alert   Airway & Oxygen Therapy: Patient Spontanous Breathing and Patient connected to face mask oxygen  Post-op Assessment: Report given to RN and Post -op Vital signs reviewed and stable  Post vital signs: Reviewed and stable  Last Vitals:  Vitals Value Taken Time  BP    Temp    Pulse    Resp    SpO2      Last Pain: There were no vitals filed for this visit.       Complications: No complications documented.

## 2020-05-01 NOTE — Op Note (Signed)
[facility] Patient Name: James Dunn Procedure Date: 05/01/2020 MRN: [id number] Attending MD: [name] , MD Date of Birth: [birth year] CSN: [id number] Age: 69 Admit Type: Outpatient Procedure:                Upper GI endoscopy Indications:              Therapeutic procedure Providers:                [name], MD, Cleda Daub, RN, Ladona Ridgel, Technician, Lesia Sago, Technician,                            Adair Laundry, CRNA Referring MD:              Medicines:                Propofol per Anesthesia Complications:            No immediate complications. Estimated Blood Loss:     Estimated blood loss was minimal. Procedure:                Pre-Anesthesia Assessment:                           - Prior to the procedure, a History and Physical                            was performed, and patient medications and                            allergies were reviewed. The patient's tolerance of                            previous anesthesia was also reviewed. The risks                            and benefits of the procedure and the sedation                            options and risks were discussed with the patient.                            All questions were answered, and informed consent                            was obtained. Prior Anticoagulants: The patient has                            taken no previous anticoagulant or antiplatelet                            agents. ASA Grade Assessment: II - A patient with                            mild  systemic disease. After reviewing the risks                            and benefits, the patient was deemed in                            satisfactory condition to undergo the procedure.                           - Sedation was administered by an anesthesia                            professional. Deep sedation was attained.                           After obtaining informed consent, the  endoscope was                            passed under direct vision. Throughout the                            procedure, the patient's blood pressure, pulse, and                            oxygen saturations were monitored continuously. The                            GIF-H190 ([id number]) Olympus gastroscope was                            introduced through the mouth, and advanced to the                            second part of duodenum. The upper GI endoscopy was                            accomplished without difficulty. The patient                            tolerated the procedure well. Scope In: Scope Out: Findings:      One benign-appearing, intrinsic severe stenosis was found 20 cm from the       incisors. This stenosis measured 1 cm (inner diameter) x 1 cm (in       length). The stenosis was traversed after dilation. A TTS dilator was       passed through the scope. Dilation with a 12-13.5-15 mm balloon dilator       was performed to 12 mm. The dilation site was examined and showed       moderate improvement in luminal narrowing. Estimated blood loss was       minimal.      Evidence of a previous surgical anastomosis was found in the gastric       body. This was characterized by healthy appearing mucosa.      The examined duodenum was normal.      The endoscope was still  not able to spontaneous pass through the       stricture. Balloon dilation started and ended at 12 mm. A large mucosal       break was achieved. It was felt that with further dilation the risk of       perforation would unreasonably increase. The endoscope was able to pass       through the stricture with mild to moderate difficulty. Impression:               - Benign-appearing esophageal stenosis. Dilated.                           - A previous surgical anastomosis was found,                            characterized by healthy appearing mucosa.                           - Normal examined duodenum.                            - No specimens collected. Moderate Sedation:      Not Applicable - Patient had care per Anesthesia. Recommendation:           - Patient has a contact number available for                            emergencies. The signs and symptoms of potential                            delayed complications were discussed with the                            patient. Return to normal activities tomorrow.                            Written discharge instructions were provided to the                            patient.                           - Resume previous diet.                           - Continue present medications.                           - Repeat upper endoscopy in 2 weeks for retreatment. Procedure Code(s):        --- Professional ---                           [phone], Esophagogastroduodenoscopy, flexible,                            transoral; with transendoscopic balloon dilation of  esophagus (less than 30 mm diameter) Diagnosis Code(s):        --- Professional ---                           K22.2, Esophageal obstruction                           Z98.0, Intestinal bypass and anastomosis status CPT copyright 2019 American Medical Association. All rights reserved. The codes documented in this report are preliminary and upon coder review may  be revised to meet current compliance requirements. [name], MD [name], MD 05/01/2020 9:51:33 AM This report has been signed electronically. Number of Addenda: 0

## 2020-05-01 NOTE — Anesthesia Procedure Notes (Signed)
Procedure Name: MAC Date/Time: 05/01/2020 9:25 AM Performed by: Cynda Familia, CRNA Pre-anesthesia Checklist: Patient identified, Emergency Drugs available, Suction available, Patient being monitored and Timeout performed Patient Re-evaluated:Patient Re-evaluated prior to induction Oxygen Delivery Method: Simple face mask Placement Confirmation: positive ETCO2 and breath sounds checked- equal and bilateral Dental Injury: Teeth and Oropharynx as per pre-operative assessment

## 2020-05-01 NOTE — Anesthesia Postprocedure Evaluation (Signed)
Anesthesia Post Note  Patient: James Dunn  Procedure(s) Performed: ESOPHAGOGASTRODUODENOSCOPY (EGD) WITH PROPOFOL (N/A ) BALLOON DILATION (N/A )     Patient location during evaluation: Endoscopy Anesthesia Type: MAC Level of consciousness: awake and alert Pain management: pain level controlled Vital Signs Assessment: post-procedure vital signs reviewed and stable Respiratory status: spontaneous breathing, nonlabored ventilation, respiratory function stable and patient connected to nasal cannula oxygen Cardiovascular status: blood pressure returned to baseline and stable Postop Assessment: no apparent nausea or vomiting Anesthetic complications: no   No complications documented.  Last Vitals:  Vitals:   05/01/20 1000 05/01/20 1010  BP: (!) 104/57 109/65  Pulse: 65 (!) 57  Resp: 20 13  Temp:    SpO2: 98% 100%    Last Pain:  Vitals:   05/01/20 1010  TempSrc:   PainSc: 0-No pain                 Barnet Glasgow

## 2020-05-01 NOTE — H&P (Signed)
James Dunn HPI: James Dunn returns for repeat dilation of the anastomotic stricture.  The last dilation diameter was 12 mm with a balloon.  Past Medical History:  Diagnosis Date  . Allergy   . Asthma   . Controlled type 2 diabetes mellitus without complication, without long-term current use of insulin ([place]) 06/25/2012  . Diverticulosis   . ED (erectile dysfunction) 12/28/2010  . Esophageal cancer ([place]) 09/22/2019  . Former smoker 06/25/2012  . GERD (gastroesophageal reflux disease) 2002  . History of colonic polyps 05/11/2016  . Hyperlipidemia associated with type 2 diabetes mellitus ([place]) 05/11/2016  . Hypertension associated with diabetes ([place]) 08/02/2017   denies  . Mild intermittent asthma without complication 0/98/1191  . Seasonal allergic rhinitis due to pollen 11/29/2016  . Thoracic aortic aneurysm ([facility])    Ascending thoracic aorta measures 4 cm diameter  . Tubular adenoma of colon 12/05/2016    Past Surgical History:  Procedure Laterality Date  . BALLOON DILATION N/A 03/20/2020   Procedure: BALLOON DILATION;  Surgeon: [name], MD;  Location: [facility];  Service: Endoscopy;  Laterality: N/A;  . BALLOON DILATION N/A 04/03/2020   Procedure: BALLOON DILATION;  Surgeon: [name], MD;  Location: [facility];  Service: Endoscopy;  Laterality: N/A;  . BALLOON DILATION N/A 04/17/2020   Procedure: BALLOON DILATION;  Surgeon: [name], MD;  Location: [facility];  Service: Endoscopy;  Laterality: N/A;  . BIOPSY  08/30/2019   Procedure: BIOPSY;  Surgeon: [name], MD;  Location: [facility];  Service: Endoscopy;;  . CHEST TUBE INSERTION Left 01/01/2020   Procedure: CHEST TUBE INSERTION;  Surgeon: [name], MD;  Location: [place];  Service: Thoracic;  Laterality: Left;  . COMPLETE ESOPHAGECTOMY N/A 01/01/2020   Procedure: TRANSHIATAL TOTAL ESOPHAGECTOMY COMPLETE WITH NIMS TUBE;  Surgeon: [name], MD;  Location: [facility];  Service: Thoracic;  Laterality:  N/A;  . ESOPHAGOGASTRODUODENOSCOPY (EGD) WITH PROPOFOL N/A 08/30/2019   Procedure: ESOPHAGOGASTRODUODENOSCOPY (EGD) WITH PROPOFOL;  Surgeon: [name], MD;  Location: [facility];  Service: Endoscopy;  Laterality: N/A;  . ESOPHAGOGASTRODUODENOSCOPY (EGD) WITH PROPOFOL N/A 09/13/2019   Procedure: ESOPHAGOGASTRODUODENOSCOPY (EGD) WITH PROPOFOL;  Surgeon: [name], MD;  Location: [facility];  Service: Endoscopy;  Laterality: N/A;  . ESOPHAGOGASTRODUODENOSCOPY (EGD) WITH PROPOFOL N/A 03/20/2020   Procedure: ESOPHAGOGASTRODUODENOSCOPY (EGD) WITH PROPOFOL;  Surgeon: [name], MD;  Location: [facility];  Service: Endoscopy;  Laterality: N/A;  . ESOPHAGOGASTRODUODENOSCOPY (EGD) WITH PROPOFOL N/A 04/03/2020   Procedure: ESOPHAGOGASTRODUODENOSCOPY (EGD) WITH PROPOFOL;  Surgeon: [name], MD;  Location: [facility];  Service: Endoscopy;  Laterality: N/A;  . ESOPHAGOGASTRODUODENOSCOPY (EGD) WITH PROPOFOL N/A 04/17/2020   Procedure: ESOPHAGOGASTRODUODENOSCOPY (EGD) WITH PROPOFOL;  Surgeon: [name], MD;  Location: [facility];  Service: Endoscopy;  Laterality: N/A;  . JEJUNOSTOMY N/A 01/01/2020   Procedure: [name];  Surgeon: [name], MD;  Location: [place];  Service: Thoracic;  Laterality: N/A;  . SKIN BIOPSY N/A 03/04/2019   upper back Nerofibroma and follicular cyst   . SUBMUCOSAL INJECTION  08/30/2019   Procedure: SUBMUCOSAL INJECTION;  Surgeon: [name], MD;  Location: [facility];  Service: Endoscopy;;  . UPPER ESOPHAGEAL ENDOSCOPIC ULTRASOUND (EUS) N/A 09/13/2019   Procedure: UPPER ESOPHAGEAL ENDOSCOPIC ULTRASOUND (EUS);  Surgeon: [name], MD;  Location: [facility];  Service: Endoscopy;  Laterality: N/A;  . VIDEO BRONCHOSCOPY N/A 01/01/2020   Procedure: VIDEO BRONCHOSCOPY WITH BRONCHIAL WASHING FOR CULTURES AND GRAM STAIN;  Surgeon: [name], MD;  Location: [place];  Service: Thoracic;  Laterality: N/A;    Family History  Problem Relation  Age of Onset  . Emphysema Mother   . AAA (abdominal aortic aneurysm) Father     Social History:  reports that he quit smoking about 13 months ago. His smoking use included cigarettes. He has never used smokeless tobacco. He reports previous alcohol use of about 7.0 standard drinks of alcohol per week. He reports that he does not use drugs.  Allergies: No Known Allergies  Medications:  Scheduled:  Continuous: . sodium chloride    . lactated ringers      No results found for this or any previous visit (from the past 24 hour(s)).   No results found.  ROS:  As stated above in the HPI otherwise negative.  Height 5\' 7"  (1.702 m), weight 68 kg.    PE: Gen: NAD, Alert and Oriented HEENT:  [place]/AT, EOMI Neck: Supple, no LAD Lungs: CTA Bilaterally CV: RRR without M/G/R ABD: Soft, NTND, +BS Ext: No C/C/E  Assessment/Plan: 1) EGD with balloon dilation.  James Dunn D 05/01/2020, 8:16 AM

## 2020-05-04 ENCOUNTER — Other Ambulatory Visit: Payer: Self-pay | Admitting: Gastroenterology

## 2020-05-04 ENCOUNTER — Encounter (HOSPITAL_COMMUNITY): Payer: Self-pay | Admitting: Gastroenterology

## 2020-05-07 ENCOUNTER — Encounter (HOSPITAL_COMMUNITY): Payer: Self-pay | Admitting: Gastroenterology

## 2020-05-20 ENCOUNTER — Other Ambulatory Visit (HOSPITAL_COMMUNITY)
Admission: RE | Admit: 2020-05-20 | Discharge: 2020-05-20 | Disposition: A | Discharge status: Home / Self Care | Payer: PPO | Source: Ambulatory Visit | Attending: Gastroenterology | Admitting: Gastroenterology

## 2020-05-20 DIAGNOSIS — Z01812 Encounter for preprocedural laboratory examination: Secondary | ICD-10-CM | POA: Diagnosis not present

## 2020-05-20 DIAGNOSIS — Z20822 Contact with and (suspected) exposure to covid-19: Secondary | ICD-10-CM | POA: Insufficient documentation

## 2020-05-20 LAB — SARS CORONAVIRUS 2 (TAT 6-24 HRS): SARS Coronavirus 2: NEGATIVE

## 2020-05-22 ENCOUNTER — Encounter (HOSPITAL_COMMUNITY)
Admission: RE | Disposition: A | Discharge status: Home / Self Care | Payer: Self-pay | Source: Home / Self Care | Attending: Gastroenterology

## 2020-05-22 ENCOUNTER — Encounter (HOSPITAL_COMMUNITY): Payer: Self-pay | Admitting: Gastroenterology

## 2020-05-22 ENCOUNTER — Ambulatory Visit (HOSPITAL_COMMUNITY): Payer: PPO | Admitting: Certified Registered Nurse Anesthetist

## 2020-05-22 ENCOUNTER — Ambulatory Visit (HOSPITAL_COMMUNITY)
Admission: RE | Admit: 2020-05-22 | Discharge: 2020-05-22 | Disposition: A | Discharge status: Home / Self Care | Payer: PPO | Attending: Gastroenterology | Admitting: Gastroenterology

## 2020-05-22 ENCOUNTER — Other Ambulatory Visit: Payer: Self-pay

## 2020-05-22 DIAGNOSIS — K219 Gastro-esophageal reflux disease without esophagitis: Secondary | ICD-10-CM | POA: Diagnosis not present

## 2020-05-22 DIAGNOSIS — Z8501 Personal history of malignant neoplasm of esophagus: Secondary | ICD-10-CM | POA: Insufficient documentation

## 2020-05-22 DIAGNOSIS — Z980 Intestinal bypass and anastomosis status: Secondary | ICD-10-CM | POA: Diagnosis not present

## 2020-05-22 DIAGNOSIS — I714 Abdominal aortic aneurysm, without rupture: Secondary | ICD-10-CM | POA: Diagnosis not present

## 2020-05-22 DIAGNOSIS — K222 Esophageal obstruction: Secondary | ICD-10-CM | POA: Diagnosis not present

## 2020-05-22 DIAGNOSIS — Z87891 Personal history of nicotine dependence: Secondary | ICD-10-CM | POA: Diagnosis not present

## 2020-05-22 DIAGNOSIS — R1310 Dysphagia, unspecified: Secondary | ICD-10-CM | POA: Diagnosis not present

## 2020-05-22 DIAGNOSIS — Z7984 Long term (current) use of oral hypoglycemic drugs: Secondary | ICD-10-CM | POA: Diagnosis not present

## 2020-05-22 DIAGNOSIS — Z86010 Personal history of colonic polyps: Secondary | ICD-10-CM | POA: Diagnosis not present

## 2020-05-22 DIAGNOSIS — Z8249 Family history of ischemic heart disease and other diseases of the circulatory system: Secondary | ICD-10-CM | POA: Insufficient documentation

## 2020-05-22 DIAGNOSIS — Z825 Family history of asthma and other chronic lower respiratory diseases: Secondary | ICD-10-CM | POA: Insufficient documentation

## 2020-05-22 DIAGNOSIS — I10 Essential (primary) hypertension: Secondary | ICD-10-CM | POA: Insufficient documentation

## 2020-05-22 DIAGNOSIS — E785 Hyperlipidemia, unspecified: Secondary | ICD-10-CM | POA: Diagnosis not present

## 2020-05-22 DIAGNOSIS — Z9049 Acquired absence of other specified parts of digestive tract: Secondary | ICD-10-CM | POA: Diagnosis not present

## 2020-05-22 DIAGNOSIS — K5730 Diverticulosis of large intestine without perforation or abscess without bleeding: Secondary | ICD-10-CM | POA: Insufficient documentation

## 2020-05-22 DIAGNOSIS — E119 Type 2 diabetes mellitus without complications: Secondary | ICD-10-CM | POA: Diagnosis not present

## 2020-05-22 DIAGNOSIS — J4520 Mild intermittent asthma, uncomplicated: Secondary | ICD-10-CM | POA: Insufficient documentation

## 2020-05-22 DIAGNOSIS — I712 Thoracic aortic aneurysm, without rupture: Secondary | ICD-10-CM | POA: Diagnosis not present

## 2020-05-22 HISTORY — PX: BALLOON DILATION: SHX5330

## 2020-05-22 HISTORY — PX: ESOPHAGOGASTRODUODENOSCOPY (EGD) WITH PROPOFOL: SHX5813

## 2020-05-22 LAB — GLUCOSE, CAPILLARY: Glucose-Capillary: 110 mg/dL — ABNORMAL HIGH (ref 70–99)

## 2020-05-22 SURGERY — ESOPHAGOGASTRODUODENOSCOPY (EGD) WITH PROPOFOL
Anesthesia: Monitor Anesthesia Care

## 2020-05-22 MED ORDER — PROPOFOL 10 MG/ML IV BOLUS
INTRAVENOUS | Status: DC | PRN
  Administered 2020-05-22: 10 mg via INTRAVENOUS

## 2020-05-22 MED ORDER — SODIUM CHLORIDE 0.9 % IV SOLN: INTRAVENOUS | Status: DC

## 2020-05-22 MED ORDER — PROPOFOL 500 MG/50ML IV EMUL
INTRAVENOUS | Status: DC | PRN
  Administered 2020-05-22: 100 ug/kg/min via INTRAVENOUS

## 2020-05-22 MED ORDER — PROPOFOL 500 MG/50ML IV EMUL
INTRAVENOUS | Status: AC
  Filled 2020-05-22: qty 50

## 2020-05-22 MED ORDER — LIDOCAINE 2% (20 MG/ML) 5 ML SYRINGE
INTRAMUSCULAR | Status: DC | PRN
  Administered 2020-05-22: 100 mg via INTRAVENOUS

## 2020-05-22 MED ORDER — PROPOFOL 10 MG/ML IV BOLUS
INTRAVENOUS | Status: AC
  Filled 2020-05-22: qty 20

## 2020-05-22 MED ORDER — LACTATED RINGERS IV SOLN: INTRAVENOUS | Status: DC

## 2020-05-22 MED ORDER — LACTATED RINGERS IV SOLN
INTRAVENOUS | Status: AC | PRN
  Administered 2020-05-22: 1000 mL via INTRAVENOUS

## 2020-05-22 SURGICAL SUPPLY — 15 items

## 2020-05-22 NOTE — Discharge Instructions (Signed)

## 2020-05-22 NOTE — H&P (Signed)
James Dunn HPI: The last dilation was at 12 mm.  He is well and he can swallow most food without difficulty.  Past Medical History:  Diagnosis Date  . Allergy   . Asthma   . Controlled type 2 diabetes mellitus without complication, without long-term current use of insulin ([place]) 06/25/2012  . Diverticulosis   . ED (erectile dysfunction) 12/28/2010  . Esophageal cancer ([place]) 09/22/2019  . Former smoker 06/25/2012  . GERD (gastroesophageal reflux disease) 2002  . History of colonic polyps 05/11/2016  . Hyperlipidemia associated with type 2 diabetes mellitus ([place]) 05/11/2016  . Hypertension associated with diabetes ([place]) 08/02/2017   denies  . Mild intermittent asthma without complication 9/48/5462  . Seasonal allergic rhinitis due to pollen 11/29/2016  . Thoracic aortic aneurysm ([facility])    Ascending thoracic aorta measures 4 cm diameter  . Tubular adenoma of colon 12/05/2016    Past Surgical History:  Procedure Laterality Date  . BALLOON DILATION N/A 03/20/2020   Procedure: BALLOON DILATION;  Surgeon: [name], MD;  Location: [facility];  Service: Endoscopy;  Laterality: N/A;  . BALLOON DILATION N/A 04/03/2020   Procedure: BALLOON DILATION;  Surgeon: [name], MD;  Location: [facility];  Service: Endoscopy;  Laterality: N/A;  . BALLOON DILATION N/A 04/17/2020   Procedure: BALLOON DILATION;  Surgeon: [name], MD;  Location: [facility];  Service: Endoscopy;  Laterality: N/A;  . BALLOON DILATION N/A 05/01/2020   Procedure: BALLOON DILATION;  Surgeon: [name], MD;  Location: [facility];  Service: Endoscopy;  Laterality: N/A;  . BIOPSY  08/30/2019   Procedure: BIOPSY;  Surgeon: [name], MD;  Location: [facility];  Service: Endoscopy;;  . CHEST TUBE INSERTION Left 01/01/2020   Procedure: CHEST TUBE INSERTION;  Surgeon: [name], MD;  Location: [place];  Service: Thoracic;  Laterality: Left;  . COMPLETE ESOPHAGECTOMY N/A 01/01/2020   Procedure:  TRANSHIATAL TOTAL ESOPHAGECTOMY COMPLETE WITH NIMS TUBE;  Surgeon: [name], MD;  Location: [facility] OR;  Service: Thoracic;  Laterality: N/A;  . ESOPHAGOGASTRODUODENOSCOPY (EGD) WITH PROPOFOL N/A 08/30/2019   Procedure: ESOPHAGOGASTRODUODENOSCOPY (EGD) WITH PROPOFOL;  Surgeon: [name], MD;  Location: [facility];  Service: Endoscopy;  Laterality: N/A;  . ESOPHAGOGASTRODUODENOSCOPY (EGD) WITH PROPOFOL N/A 09/13/2019   Procedure: ESOPHAGOGASTRODUODENOSCOPY (EGD) WITH PROPOFOL;  Surgeon: [name], MD;  Location: [facility];  Service: Endoscopy;  Laterality: N/A;  . ESOPHAGOGASTRODUODENOSCOPY (EGD) WITH PROPOFOL N/A 03/20/2020   Procedure: ESOPHAGOGASTRODUODENOSCOPY (EGD) WITH PROPOFOL;  Surgeon: [name], MD;  Location: [facility];  Service: Endoscopy;  Laterality: N/A;  . ESOPHAGOGASTRODUODENOSCOPY (EGD) WITH PROPOFOL N/A 04/03/2020   Procedure: ESOPHAGOGASTRODUODENOSCOPY (EGD) WITH PROPOFOL;  Surgeon: [name], MD;  Location: [facility];  Service: Endoscopy;  Laterality: N/A;  . ESOPHAGOGASTRODUODENOSCOPY (EGD) WITH PROPOFOL N/A 04/17/2020   Procedure: ESOPHAGOGASTRODUODENOSCOPY (EGD) WITH PROPOFOL;  Surgeon: [name], MD;  Location: [facility];  Service: Endoscopy;  Laterality: N/A;  . ESOPHAGOGASTRODUODENOSCOPY (EGD) WITH PROPOFOL N/A 05/01/2020   Procedure: ESOPHAGOGASTRODUODENOSCOPY (EGD) WITH PROPOFOL;  Surgeon: [name], MD;  Location: [facility];  Service: Endoscopy;  Laterality: N/A;  . JEJUNOSTOMY N/A 01/01/2020   Procedure: [name];  Surgeon: [name], MD;  Location: [facility];  Service: Thoracic;  Laterality: N/A;  . SKIN BIOPSY N/A 03/04/2019   upper back Nerofibroma and follicular cyst   . SUBMUCOSAL INJECTION  08/30/2019   Procedure: SUBMUCOSAL INJECTION;  Surgeon: [name], MD;  Location: [facility];  Service: Endoscopy;;  . UPPER ESOPHAGEAL ENDOSCOPIC ULTRASOUND (EUS) N/A  09/13/2019   Procedure: UPPER ESOPHAGEAL ENDOSCOPIC  ULTRASOUND (EUS);  Surgeon: [name], MD;  Location: [facility];  Service: Endoscopy;  Laterality: N/A;  . VIDEO BRONCHOSCOPY N/A 01/01/2020   Procedure: VIDEO BRONCHOSCOPY WITH BRONCHIAL WASHING FOR CULTURES AND GRAM STAIN;  Surgeon: [name], MD;  Location: [facility] OR;  Service: Thoracic;  Laterality: N/A;    Family History  Problem Relation Age of Onset  . Emphysema Mother   . AAA (abdominal aortic aneurysm) Father     Social History:  reports that he quit smoking about 14 months ago. His smoking use included cigarettes. He has never used smokeless tobacco. He reports previous alcohol use of about 7.0 standard drinks of alcohol per week. He reports that he does not use drugs.  Allergies: No Known Allergies  Medications:  Scheduled:  Continuous: . sodium chloride    . lactated ringers    . lactated ringers 10 mL/hr at 05/22/20 1023    Results for orders placed or performed during the hospital encounter of 05/22/20 (from the past 24 hour(s))  Glucose, capillary     Status: Abnormal   Collection Time: 05/22/20  9:20 AM  Result Value Ref Range   Glucose-Capillary 110 (H) 70 - 99 mg/dL     No results found.  ROS:  As stated above in the HPI otherwise negative.  Blood pressure (!) 111/58, pulse (!) 56, temperature 98.6 F (37 C), temperature source Oral, resp. rate 12, height 5\' 7"  (1.702 m), weight 66.7 kg, SpO2 98 %.    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  James Dunn D 05/22/2020, 10:30 AM

## 2020-05-22 NOTE — Anesthesia Postprocedure Evaluation (Signed)
Anesthesia Post Note  Patient: James Dunn  Procedure(s) Performed: ESOPHAGOGASTRODUODENOSCOPY (EGD) WITH PROPOFOL (N/A ) BALLOON DILATION (N/A )     Patient location during evaluation: Endoscopy Anesthesia Type: MAC Level of consciousness: awake and alert Pain management: pain level controlled Vital Signs Assessment: post-procedure vital signs reviewed and stable Respiratory status: spontaneous breathing, nonlabored ventilation, respiratory function stable and patient connected to nasal cannula oxygen Cardiovascular status: stable and blood pressure returned to baseline Postop Assessment: no apparent nausea or vomiting Anesthetic complications: no   No complications documented.  Last Vitals:  Vitals:   05/22/20 1110 05/22/20 1120  BP: 111/65 113/74  Pulse: 69 61  Resp: 15 15  Temp:    SpO2: 96% 100%    Last Pain:  Vitals:   05/22/20 1120  TempSrc:   PainSc: 0-No pain                 Belenda Cruise P Lynanne Delgreco

## 2020-05-22 NOTE — Op Note (Signed)
[facility] Patient Name: James Dunn Procedure Date: 05/22/2020 MRN: [id number] Attending MD: [name] , MD Date of Birth: 1950/10/07 CSN: [id number] Age: 69 Admit Type: Outpatient Procedure:                Upper GI endoscopy Indications:              Therapeutic procedure Providers:                [name], MD, Cleda Daub, RN, Tyna Jaksch                            Technician Referring MD:              Medicines:                Propofol per Anesthesia Complications:            No immediate complications. Estimated Blood Loss:     Estimated blood loss was minimal. Procedure:                Pre-Anesthesia Assessment:                           - Prior to the procedure, a History and Physical                            was performed, and patient medications and                            allergies were reviewed. The patient's tolerance of                            previous anesthesia was also reviewed. The risks                            and benefits of the procedure and the sedation                            options and risks were discussed with the patient.                            All questions were answered, and informed consent                            was obtained. Prior Anticoagulants: The patient has                            taken no previous anticoagulant or antiplatelet                            agents. ASA Grade Assessment: II - A patient with                            mild systemic disease. After reviewing the risks  and benefits, the patient was deemed in                            satisfactory condition to undergo the procedure.                           - Sedation was administered by an anesthesia                            professional. Deep sedation was attained.                           After obtaining informed consent, the endoscope was                            passed under direct vision. Throughout  the                            procedure, the patient's blood pressure, pulse, and                            oxygen saturations were monitored continuously. The                            GIF-H190 ([id number]) Olympus gastroscope was                            introduced through the mouth, and advanced to the                            second part of duodenum. The upper GI endoscopy was                            accomplished without difficulty. The patient                            tolerated the procedure well. Scope In: Scope Out: Findings:      One benign-appearing, intrinsic severe stenosis was found 20 cm from the       incisors. This stenosis measured 1 cm (inner diameter) x 1 cm (in       length). The stenosis was traversed after dilation. A TTS dilator was       passed through the scope. Dilation with a 12-13.5-15 mm balloon dilator       was performed to 13.5 mm. The dilation site was examined and showed       complete resolution of luminal narrowing. Estimated blood loss was       minimal.      Evidence of a previous surgical anastomosis was found in the gastric       body. This was characterized by healthy appearing mucosa.      The examined duodenum was normal.      There is evidence that the stenosis is greater in its patency, but the       endoscope was not able to pass through the area spontaneously. Dilation       was  performed at 12 mm and the mucosal break was created, but it was       smaller than the last dilation. Further dilation commenced incrementally       up to 13.5 mm. Close observation of the mucosal break was monitored to       ensure no rapid mucosal disruption. Total dilation time at 13.5 mm was       approximately 30 seconds. Evaluation of the mucosal break showed that it       increased in size, which was the objective. The endoscope freely moved       through the area. Impression:               - Benign-appearing esophageal stenosis. Dilated.                            - A previous surgical anastomosis was found,                            characterized by healthy appearing mucosa.                           - Normal examined duodenum.                           - No specimens collected. Moderate Sedation:      Not Applicable - Patient had care per Anesthesia. Recommendation:           - Patient has a contact number available for                            emergencies. The signs and symptoms of potential                            delayed complications were discussed with the                            patient. Return to normal activities tomorrow.                            Written discharge instructions were provided to the                            patient.                           - Resume previous diet.                           - Continue present medications.                           - Repeat upper endoscopy in 2 weeks for retreatment. Procedure Code(s):        --- Professional ---                           [phone], Esophagogastroduodenoscopy, flexible,  transoral; with transendoscopic balloon dilation of                            esophagus (less than 30 mm diameter) Diagnosis Code(s):        --- Professional ---                           K22.2, Esophageal obstruction                           Z98.0, Intestinal bypass and anastomosis status CPT copyright 2019 [employer]. All rights reserved. The codes documented in this report are preliminary and upon coder review may  be revised to meet current compliance requirements. [name], MD [name], MD 05/22/2020 11:03:57 AM This report has been signed electronically. Number of Addenda: 0

## 2020-05-22 NOTE — Anesthesia Preprocedure Evaluation (Signed)
Anesthesia Evaluation  Patient identified by MRN, date of birth, ID band Patient awake    Reviewed: Patient's Chart, lab work & pertinent test results  Airway Mallampati: II  TM Distance: >3 FB Neck ROM: Full    Dental  (+) Teeth Intact   Pulmonary asthma , former smoker,    Pulmonary exam normal        Cardiovascular hypertension, Pt. on medications  Rhythm:Regular Rate:Normal     Neuro/Psych negative neurological ROS  negative psych ROS   GI/Hepatic Neg liver ROS, GERD  ,Diverticulosis, colon polyps, esophageal cancer   Endo/Other  diabetes, Well Controlled, Oral Hypoglycemic Agents  Renal/GU negative Renal ROS  negative genitourinary   Musculoskeletal negative musculoskeletal ROS (+)   Abdominal (+)  Abdomen: soft. Bowel sounds: normal.  Peds  Hematology negative hematology ROS (+)   Anesthesia Other Findings   Reproductive/Obstetrics                             Anesthesia Physical Anesthesia Plan  ASA: III  Anesthesia Plan: MAC   Post-op Pain Management:    Induction:   PONV Risk Score and Plan: 1 and Propofol infusion and Treatment may vary due to age or medical condition  Airway Management Planned: Simple Face Mask, Natural Airway and Nasal Cannula  Additional Equipment: None  Intra-op Plan:   Post-operative Plan:   Informed Consent: I have reviewed the patients History and Physical, chart, labs and discussed the procedure including the risks, benefits and alternatives for the proposed anesthesia with the patient or authorized representative who has indicated his/her understanding and acceptance.     Dental advisory given  Plan Discussed with: CRNA  Anesthesia Plan Comments: (Lab Results      Component                Value               Date                      WBC                      4.6                 03/17/2020                HGB                       12.2 (L)            03/17/2020                HCT                      37.6 (L)            03/17/2020                MCV                      85.1                03/17/2020                PLT                      179  03/17/2020          )        Anesthesia Quick Evaluation

## 2020-05-22 NOTE — Transfer of Care (Signed)
Immediate Anesthesia Transfer of Care Note  Patient: James Dunn  Procedure(s) Performed: ESOPHAGOGASTRODUODENOSCOPY (EGD) WITH PROPOFOL (N/A ) BALLOON DILATION (N/A )  Patient Location: Endoscopy Unit  Anesthesia Type:MAC  Level of Consciousness: drowsy and patient cooperative  Airway & Oxygen Therapy: Patient Spontanous Breathing and Patient connected to face mask oxygen  Post-op Assessment: Report given to RN and Post -op Vital signs reviewed and stable  Post vital signs: Reviewed and stable  Last Vitals:  Vitals Value Taken Time  BP    Temp    Pulse    Resp    SpO2      Last Pain:  Vitals:   05/22/20 0851  TempSrc: Oral  PainSc: 0-No pain         Complications: No complications documented.

## 2020-05-26 ENCOUNTER — Encounter (HOSPITAL_COMMUNITY): Payer: Self-pay | Admitting: Gastroenterology

## 2020-05-26 ENCOUNTER — Other Ambulatory Visit: Payer: Self-pay | Admitting: Gastroenterology

## 2020-06-03 ENCOUNTER — Other Ambulatory Visit: Payer: Self-pay

## 2020-06-03 ENCOUNTER — Other Ambulatory Visit (HOSPITAL_COMMUNITY)
Admission: RE | Admit: 2020-06-03 | Discharge: 2020-06-03 | Disposition: A | Discharge status: Home / Self Care | Payer: PPO | Source: Ambulatory Visit | Attending: Gastroenterology | Admitting: Gastroenterology

## 2020-06-03 DIAGNOSIS — Z01812 Encounter for preprocedural laboratory examination: Secondary | ICD-10-CM | POA: Diagnosis not present

## 2020-06-03 DIAGNOSIS — Z20822 Contact with and (suspected) exposure to covid-19: Secondary | ICD-10-CM | POA: Insufficient documentation

## 2020-06-03 LAB — SARS CORONAVIRUS 2 (TAT 6-24 HRS): SARS Coronavirus 2: NEGATIVE

## 2020-06-04 NOTE — Anesthesia Preprocedure Evaluation (Addendum)
Anesthesia Evaluation  Patient identified by MRN, date of birth, ID band Patient awake    Reviewed: Allergy & Precautions, NPO status , Patient's Chart, lab work & pertinent test results  Airway Mallampati: II  TM Distance: >3 FB Neck ROM: Full    Dental no notable dental hx. (+) Dental Advisory Given   Pulmonary asthma , former smoker,    Pulmonary exam normal        Cardiovascular hypertension, Pt. on medications Normal cardiovascular exam     Neuro/Psych negative neurological ROS  negative psych ROS   GI/Hepatic Neg liver ROS, GERD  ,Diverticulosis, colon polyps, esophageal cancer   Endo/Other  diabetes, Well Controlled, Oral Hypoglycemic Agents  Renal/GU negative Renal ROS  negative genitourinary   Musculoskeletal negative musculoskeletal ROS (+)   Abdominal   Peds  Hematology negative hematology ROS (+)   Anesthesia Other Findings   Reproductive/Obstetrics                            Anesthesia Physical  Anesthesia Plan  ASA: III  Anesthesia Plan: MAC   Post-op Pain Management:    Induction:   PONV Risk Score and Plan: 1 and Propofol infusion, Treatment may vary due to age or medical condition and Ondansetron  Airway Management Planned: Simple Face Mask, Natural Airway and Nasal Cannula  Additional Equipment: None  Intra-op Plan:   Post-operative Plan:   Informed Consent: I have reviewed the patients History and Physical, chart, labs and discussed the procedure including the risks, benefits and alternatives for the proposed anesthesia with the patient or authorized representative who has indicated his/her understanding and acceptance.     Dental advisory given  Plan Discussed with: CRNA and Anesthesiologist  Anesthesia Plan Comments:        Anesthesia Quick Evaluation

## 2020-06-05 ENCOUNTER — Ambulatory Visit (HOSPITAL_COMMUNITY): Payer: PPO | Admitting: Anesthesiology

## 2020-06-05 ENCOUNTER — Encounter (HOSPITAL_COMMUNITY): Payer: Self-pay | Admitting: Gastroenterology

## 2020-06-05 ENCOUNTER — Ambulatory Visit (HOSPITAL_COMMUNITY)
Admission: RE | Admit: 2020-06-05 | Discharge: 2020-06-05 | Disposition: A | Discharge status: Home / Self Care | Payer: PPO | Attending: Gastroenterology | Admitting: Gastroenterology

## 2020-06-05 ENCOUNTER — Other Ambulatory Visit: Payer: Self-pay

## 2020-06-05 ENCOUNTER — Encounter (HOSPITAL_COMMUNITY)
Admission: RE | Disposition: A | Discharge status: Home / Self Care | Payer: Self-pay | Source: Home / Self Care | Attending: Gastroenterology

## 2020-06-05 DIAGNOSIS — I712 Thoracic aortic aneurysm, without rupture: Secondary | ICD-10-CM | POA: Diagnosis not present

## 2020-06-05 DIAGNOSIS — Z980 Intestinal bypass and anastomosis status: Secondary | ICD-10-CM | POA: Insufficient documentation

## 2020-06-05 DIAGNOSIS — Z8501 Personal history of malignant neoplasm of esophagus: Secondary | ICD-10-CM | POA: Diagnosis not present

## 2020-06-05 DIAGNOSIS — Z86010 Personal history of colonic polyps: Secondary | ICD-10-CM | POA: Diagnosis not present

## 2020-06-05 DIAGNOSIS — E785 Hyperlipidemia, unspecified: Secondary | ICD-10-CM | POA: Diagnosis not present

## 2020-06-05 DIAGNOSIS — K222 Esophageal obstruction: Secondary | ICD-10-CM | POA: Insufficient documentation

## 2020-06-05 DIAGNOSIS — K219 Gastro-esophageal reflux disease without esophagitis: Secondary | ICD-10-CM | POA: Diagnosis not present

## 2020-06-05 DIAGNOSIS — Z8249 Family history of ischemic heart disease and other diseases of the circulatory system: Secondary | ICD-10-CM | POA: Insufficient documentation

## 2020-06-05 DIAGNOSIS — R1310 Dysphagia, unspecified: Secondary | ICD-10-CM | POA: Diagnosis not present

## 2020-06-05 DIAGNOSIS — Z7984 Long term (current) use of oral hypoglycemic drugs: Secondary | ICD-10-CM | POA: Diagnosis not present

## 2020-06-05 DIAGNOSIS — J4520 Mild intermittent asthma, uncomplicated: Secondary | ICD-10-CM | POA: Insufficient documentation

## 2020-06-05 DIAGNOSIS — I10 Essential (primary) hypertension: Secondary | ICD-10-CM | POA: Diagnosis not present

## 2020-06-05 DIAGNOSIS — E119 Type 2 diabetes mellitus without complications: Secondary | ICD-10-CM | POA: Insufficient documentation

## 2020-06-05 DIAGNOSIS — Z87891 Personal history of nicotine dependence: Secondary | ICD-10-CM | POA: Insufficient documentation

## 2020-06-05 DIAGNOSIS — Z825 Family history of asthma and other chronic lower respiratory diseases: Secondary | ICD-10-CM | POA: Insufficient documentation

## 2020-06-05 HISTORY — PX: ESOPHAGOGASTRODUODENOSCOPY (EGD) WITH PROPOFOL: SHX5813

## 2020-06-05 HISTORY — PX: BALLOON DILATION: SHX5330

## 2020-06-05 LAB — GLUCOSE, CAPILLARY: Glucose-Capillary: 119 mg/dL — ABNORMAL HIGH (ref 70–99)

## 2020-06-05 SURGERY — ESOPHAGOGASTRODUODENOSCOPY (EGD) WITH PROPOFOL
Anesthesia: Monitor Anesthesia Care

## 2020-06-05 MED ORDER — SODIUM CHLORIDE 0.9 % IV SOLN: INTRAVENOUS | Status: DC

## 2020-06-05 MED ORDER — LACTATED RINGERS IV SOLN
INTRAVENOUS | Status: DC
  Administered 2020-06-05: 1000 mL via INTRAVENOUS

## 2020-06-05 MED ORDER — PROPOFOL 500 MG/50ML IV EMUL
INTRAVENOUS | Status: DC | PRN
  Administered 2020-06-05 (×11): 50 mg via INTRAVENOUS

## 2020-06-05 MED ORDER — LIDOCAINE 2% (20 MG/ML) 5 ML SYRINGE
INTRAMUSCULAR | Status: DC | PRN
  Administered 2020-06-05: 100 mg via INTRAVENOUS

## 2020-06-05 MED ORDER — GLYCOPYRROLATE PF 0.2 MG/ML IJ SOSY
PREFILLED_SYRINGE | INTRAMUSCULAR | Status: DC | PRN
  Administered 2020-06-05: .2 mg via INTRAVENOUS

## 2020-06-05 SURGICAL SUPPLY — 15 items

## 2020-06-05 NOTE — Transfer of Care (Signed)
Immediate Anesthesia Transfer of Care Note  Patient: James Dunn  Procedure(s) Performed: ESOPHAGOGASTRODUODENOSCOPY (EGD) WITH PROPOFOL (N/A ) BALLOON DILATION (N/A )  Patient Location: PACU and Endoscopy Unit  Anesthesia Type:MAC  Level of Consciousness: awake, alert  and oriented  Airway & Oxygen Therapy: Patient Spontanous Breathing and Patient connected to face mask  Post-op Assessment: Report given to RN and Post -op Vital signs reviewed and stable  Post vital signs: Reviewed and stable  Last Vitals:  Vitals Value Taken Time  BP 99/58 06/05/20 0757  Temp    Pulse 87 06/05/20 0759  Resp 15 06/05/20 0759  SpO2 100 % 06/05/20 0759  Vitals shown include unvalidated device data.  Last Pain:  Vitals:   06/05/20 0652  TempSrc: Oral  PainSc: 0-No pain         Complications: No complications documented.

## 2020-06-05 NOTE — Anesthesia Postprocedure Evaluation (Addendum)
Anesthesia Post Note  Patient: James Dunn  Procedure(s) Performed: ESOPHAGOGASTRODUODENOSCOPY (EGD) WITH PROPOFOL (N/A ) BALLOON DILATION (N/A )     Patient location during evaluation: Endoscopy Anesthesia Type: MAC Level of consciousness: awake and alert Pain management: pain level controlled Vital Signs Assessment: post-procedure vital signs reviewed and stable Respiratory status: spontaneous breathing and respiratory function stable Cardiovascular status: stable Postop Assessment: no apparent nausea or vomiting Anesthetic complications: no   No complications documented.  Last Vitals:  Vitals:   06/05/20 0810 06/05/20 0820  BP: 98/68 111/62  Pulse: (!) 102 85  Resp: (!) 21 20  Temp:    SpO2: 97% 97%    Last Pain:  Vitals:   06/05/20 0820  TempSrc:   PainSc: 0-No pain                 Kaytie Ratcliffe DANIEL

## 2020-06-05 NOTE — Op Note (Signed)
[facility] Patient Name: James Dunn Procedure Date: 06/05/2020 MRN: [id number] Attending MD: [name] , MD Date of Birth: 06-18-1951 CSN: [id number] Age: 69 Admit Type: Outpatient Procedure:                Upper GI endoscopy Indications:              Therapeutic procedure Providers:                [name], MD, Angus Seller, Cherylynn Ridges,                            Technician, Margurite Auerbach CRNA Referring MD:              Medicines:                Propofol per Anesthesia Complications:            No immediate complications. Estimated Blood Loss:     Estimated blood loss: none. Procedure:                Pre-Anesthesia Assessment:                           - Prior to the procedure, a History and Physical                            was performed, and patient medications and                            allergies were reviewed. The patient's tolerance of                            previous anesthesia was also reviewed. The risks                            and benefits of the procedure and the sedation                            options and risks were discussed with the patient.                            All questions were answered, and informed consent                            was obtained. Prior Anticoagulants: The patient has                            taken no previous anticoagulant or antiplatelet                            agents. ASA Grade Assessment: II - A patient with                            mild systemic disease. After reviewing the risks  and benefits, the patient was deemed in                            satisfactory condition to undergo the procedure.                           - Sedation was administered by an anesthesia                            professional. Deep sedation was attained.                           After obtaining informed consent, the endoscope was                            passed under direct  vision. Throughout the                            procedure, the patient's blood pressure, pulse, and                            oxygen saturations were monitored continuously. The                            GIF-H190 ([id number]) Olympus gastroscope was                            introduced through the mouth, and advanced to the                            second part of duodenum. The upper GI endoscopy was                            accomplished without difficulty. The patient                            tolerated the procedure well. Scope In: Scope Out: Findings:      One benign-appearing, intrinsic severe stenosis was found 20 cm from the       incisors. This stenosis measured 9 mm (inner diameter) x less than one       cm (in length). The stenosis was traversed after dilation. A TTS dilator       was passed through the scope. Dilation with a 12-13.5-15 mm balloon       dilator was performed to 14 mm. The dilation site was examined and       showed complete resolution of luminal narrowing. Estimated blood loss:       none.      Evidence of a previous surgical anastomosis was found in the gastric       body. This was characterized by healthy appearing mucosa.      The examined duodenum was normal.      The endoscope was not able to passed through the stricture. Sequential       dilation was started at 12 mm and held for 30 seconds. Inspection of the  dilation site did not showany significant mucosal disruption and the       endoscope did not pass. The balloon was reinflated and dilation was       performed at 13.5 mm and held for 1 minute. The expected mucosal tear       was achieved, but there was moderate resistance to passage of the       endoscope. The decision was made to dilate up to 14-14.5 mm using the       ATMs on the balloon. A maximum dilation at 7 ATMs was performed. Close       inspection of the mucosa through the balloon was performed to observe       for any severe  tearing in real time. After this dilation there was no       resistance to moving through the stricture and there was no evidence of       crepitus. Impression:               - Benign-appearing esophageal stenosis. Dilated.                           - A previous surgical anastomosis was found,                            characterized by healthy appearing mucosa.                           - Normal examined duodenum.                           - No specimens collected. Moderate Sedation:      Not Applicable - Patient had care per Anesthesia. Recommendation:           - Patient has a contact number available for                            emergencies. The signs and symptoms of potential                            delayed complications were discussed with the                            patient. Return to normal activities tomorrow.                            Written discharge instructions were provided to the                            patient.                           - Resume previous diet.                           - Continue present medications.                           - Repeat EGD in 1 month. Procedure Code(s):        ---  Professional ---                           [phone], Esophagogastroduodenoscopy, flexible,                            transoral; with transendoscopic balloon dilation of                            esophagus (less than 30 mm diameter) Diagnosis Code(s):        --- Professional ---                           K22.2, Esophageal obstruction                           Z98.0, Intestinal bypass and anastomosis status CPT copyright 2019 [employer]. All rights reserved. The codes documented in this report are preliminary and upon coder review may  be revised to meet current compliance requirements. [name], MD [name], MD 06/05/2020 8:08:40 AM This report has been signed electronically. Number of Addenda: 0

## 2020-06-05 NOTE — Addendum Note (Signed)
Addendum  created 06/05/20 1019 by Duane Boston, MD   Clinical Note Signed

## 2020-06-05 NOTE — H&P (Signed)
James Dunn HPI: The patient reports swallowing well.  He does report having GERD issues.  Eating smaller meals minimizes his abdominal pain.  Past Medical History:  Diagnosis Date  . Allergy   . Asthma   . Controlled type 2 diabetes mellitus without complication, without long-term current use of insulin ([place]) 06/25/2012  . Diverticulosis   . ED (erectile dysfunction) 12/28/2010  . Esophageal cancer ([place]) 09/22/2019  . Former smoker 06/25/2012  . GERD (gastroesophageal reflux disease) 2002  . History of colonic polyps 05/11/2016  . Hyperlipidemia associated with type 2 diabetes mellitus ([place]) 05/11/2016  . Hypertension associated with diabetes ([place]) 08/02/2017   denies  . Mild intermittent asthma without complication 7/89/3810  . Seasonal allergic rhinitis due to pollen 11/29/2016  . Thoracic aortic aneurysm ([facility])    Ascending thoracic aorta measures 4 cm diameter  . Tubular adenoma of colon 12/05/2016    Past Surgical History:  Procedure Laterality Date  . BALLOON DILATION N/A 03/20/2020   Procedure: BALLOON DILATION;  Surgeon: [name], MD;  Location: [facility];  Service: Endoscopy;  Laterality: N/A;  . BALLOON DILATION N/A 04/03/2020   Procedure: BALLOON DILATION;  Surgeon: [name], MD;  Location: [facility];  Service: Endoscopy;  Laterality: N/A;  . BALLOON DILATION N/A 04/17/2020   Procedure: BALLOON DILATION;  Surgeon: [name], MD;  Location: [facility];  Service: Endoscopy;  Laterality: N/A;  . BALLOON DILATION N/A 05/01/2020   Procedure: BALLOON DILATION;  Surgeon: [name], MD;  Location: [facility];  Service: Endoscopy;  Laterality: N/A;  . BALLOON DILATION N/A 05/22/2020   Procedure: BALLOON DILATION;  Surgeon: [name], MD;  [facility]: [facility];  Service: Endoscopy;  Laterality: N/A;  . BIOPSY  08/30/2019   Procedure: BIOPSY;  Surgeon: [name], MD;  Location: [facility];  Service: Endoscopy;;  . CHEST TUBE INSERTION Left 01/01/2020    Procedure: CHEST TUBE INSERTION;  Surgeon: [name], MD;  Location: [place];  Service: Thoracic;  Laterality: Left;  . COMPLETE ESOPHAGECTOMY N/A 01/01/2020   Procedure: TRANSHIATAL TOTAL ESOPHAGECTOMY COMPLETE WITH NIMS TUBE;  Surgeon: [name], MD;  [facility];  Service: Thoracic;  Laterality: N/A;  . ESOPHAGOGASTRODUODENOSCOPY (EGD) WITH PROPOFOL N/A 08/30/2019   Procedure: ESOPHAGOGASTRODUODENOSCOPY (EGD) WITH PROPOFOL;  Surgeon: [name], MD;  [facility];  Service: Endoscopy;  Laterality: N/A;  . ESOPHAGOGASTRODUODENOSCOPY (EGD) WITH PROPOFOL N/A 09/13/2019   Procedure: ESOPHAGOGASTRODUODENOSCOPY (EGD) WITH PROPOFOL;  Surgeon: [name], MD;  [facility];  Service: Endoscopy;  Laterality: N/A;  . ESOPHAGOGASTRODUODENOSCOPY (EGD) WITH PROPOFOL N/A 03/20/2020   Procedure: ESOPHAGOGASTRODUODENOSCOPY (EGD) WITH PROPOFOL;  Surgeon: [name], MD;  [facility];  Service: Endoscopy;  Laterality: N/A;  . ESOPHAGOGASTRODUODENOSCOPY (EGD) WITH PROPOFOL N/A 04/03/2020   Procedure: ESOPHAGOGASTRODUODENOSCOPY (EGD) WITH PROPOFOL;  Surgeon: [name], MD;  [facility];  Service: Endoscopy;  Laterality: N/A;  . ESOPHAGOGASTRODUODENOSCOPY (EGD) WITH PROPOFOL N/A 04/17/2020   Procedure: ESOPHAGOGASTRODUODENOSCOPY (EGD) WITH PROPOFOL;  Surgeon: [name], MD;  [facility];  Service: Endoscopy;  Laterality: N/A;  . ESOPHAGOGASTRODUODENOSCOPY (EGD) WITH PROPOFOL N/A 05/01/2020   Procedure: ESOPHAGOGASTRODUODENOSCOPY (EGD) WITH PROPOFOL;  Surgeon: [name], MD;  [facility];  Service: Endoscopy;  Laterality: N/A;  . ESOPHAGOGASTRODUODENOSCOPY (EGD) WITH PROPOFOL N/A 05/22/2020   Procedure: ESOPHAGOGASTRODUODENOSCOPY (EGD) WITH PROPOFOL;  Surgeon: [name], MD;  Location: [facility];  Service: Endoscopy;  Laterality: N/A;  . JEJUNOSTOMY N/A 01/01/2020   Procedure: [name];  Surgeon: Lanelle Bal  B, MD;  Location: [place];  Service: Thoracic;  Laterality: N/A;  . SKIN BIOPSY N/A 03/04/2019   upper back Nerofibroma and follicular cyst   . SUBMUCOSAL INJECTION  08/30/2019   Procedure: SUBMUCOSAL INJECTION;  Surgeon: [name], MD;  Location: [facility];  Service: Endoscopy;;  . UPPER ESOPHAGEAL ENDOSCOPIC ULTRASOUND (EUS) N/A 09/13/2019   Procedure: UPPER ESOPHAGEAL ENDOSCOPIC ULTRASOUND (EUS);  Surgeon: [name], MD;  Location: [facility];  Service: Endoscopy;  Laterality: N/A;  . VIDEO BRONCHOSCOPY N/A 01/01/2020   Procedure: VIDEO BRONCHOSCOPY WITH BRONCHIAL WASHING FOR CULTURES AND GRAM STAIN;  Surgeon: [name], MD;  Location: [facility];  Service: Thoracic;  Laterality: N/A;    Family History  Problem Relation Age of Onset  . Emphysema Mother   . AAA (abdominal aortic aneurysm) Father     Social History:  reports that he quit smoking about 14 months ago. His smoking use included cigarettes. He has never used smokeless tobacco. He reports previous alcohol use of about 7.0 standard drinks of alcohol per week. He reports that he does not use drugs.  Allergies: No Known Allergies  Medications:  Scheduled:  Continuous: . sodium chloride    . lactated ringers      No results found for this or any previous visit (from the past 24 hour(s)).   No results found.  ROS:  As stated above in the HPI otherwise negative.  Blood pressure (!) 142/63, pulse (!) 55, temperature 99 F (37.2 C), temperature source Oral, resp. rate 14, height 5\' 7"  (1.702 m), weight 68 kg, SpO2 99 %.    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  Amado Andal D 06/05/2020, 7:05 AM

## 2020-06-05 NOTE — Discharge Instructions (Signed)

## 2020-06-07 ENCOUNTER — Encounter (HOSPITAL_COMMUNITY): Payer: Self-pay | Admitting: Gastroenterology

## 2020-06-08 ENCOUNTER — Other Ambulatory Visit: Payer: Self-pay | Admitting: Gastroenterology

## 2020-07-08 ENCOUNTER — Other Ambulatory Visit (HOSPITAL_COMMUNITY)
Admission: RE | Admit: 2020-07-08 | Discharge: 2020-07-08 | Disposition: A | Discharge status: Home / Self Care | Payer: PPO | Source: Ambulatory Visit | Attending: Gastroenterology | Admitting: Gastroenterology

## 2020-07-08 DIAGNOSIS — Z01812 Encounter for preprocedural laboratory examination: Secondary | ICD-10-CM | POA: Insufficient documentation

## 2020-07-08 DIAGNOSIS — Z20822 Contact with and (suspected) exposure to covid-19: Secondary | ICD-10-CM | POA: Insufficient documentation

## 2020-07-08 LAB — SARS CORONAVIRUS 2 (TAT 6-24 HRS): SARS Coronavirus 2: NEGATIVE

## 2020-07-09 ENCOUNTER — Encounter (HOSPITAL_COMMUNITY): Payer: Self-pay | Admitting: Gastroenterology

## 2020-07-09 NOTE — Anesthesia Preprocedure Evaluation (Addendum)
Anesthesia Evaluation  Patient identified by MRN, date of birth, ID band Patient awake    Reviewed: Allergy & Precautions, NPO status , Patient's Chart, lab work & pertinent test results, reviewed documented beta blocker date and time   Airway Mallampati: II  TM Distance: >3 FB Neck ROM: Full    Dental  (+) Missing, Poor Dentition, Dental Advisory Given,    Pulmonary asthma , former smoker,    Pulmonary exam normal breath sounds clear to auscultation       Cardiovascular hypertension, Pt. on medications and Pt. on home beta blockers + CAD  Normal cardiovascular exam Rhythm:Regular Rate:Normal     Neuro/Psych negative neurological ROS  negative psych ROS   GI/Hepatic Neg liver ROS, GERD  Medicated and Controlled,Dysphagia Hx/o esophageal Ca Hx/o diverticulosis Esophageal stricture   Endo/Other  diabetes, Well Controlled, Type 2, Oral Hypoglycemic AgentsHyperlipidemia  Renal/GU negative Renal ROS   ED    Musculoskeletal negative musculoskeletal ROS (+)   Abdominal   Peds  Hematology negative hematology ROS (+) anemia ,   Anesthesia Other Findings   Reproductive/Obstetrics                            Anesthesia Physical  Anesthesia Plan  ASA: III  Anesthesia Plan: MAC   Post-op Pain Management:    Induction: Intravenous  PONV Risk Score and Plan: 2 and Propofol infusion  Airway Management Planned: Natural Airway and Nasal Cannula  Additional Equipment:   Intra-op Plan:   Post-operative Plan:   Informed Consent: I have reviewed the patients History and Physical, chart, labs and discussed the procedure including the risks, benefits and alternatives for the proposed anesthesia with the patient or authorized representative who has indicated his/her understanding and acceptance.     Dental advisory given  Plan Discussed with: Anesthesiologist and CRNA  Anesthesia Plan  Comments:        Anesthesia Quick Evaluation

## 2020-07-10 ENCOUNTER — Ambulatory Visit (HOSPITAL_COMMUNITY): Payer: PPO | Admitting: Anesthesiology

## 2020-07-10 ENCOUNTER — Encounter (HOSPITAL_COMMUNITY)
Admission: RE | Disposition: A | Discharge status: Home / Self Care | Payer: Self-pay | Source: Home / Self Care | Attending: Gastroenterology

## 2020-07-10 ENCOUNTER — Other Ambulatory Visit: Payer: Self-pay

## 2020-07-10 ENCOUNTER — Ambulatory Visit (HOSPITAL_COMMUNITY)
Admission: RE | Admit: 2020-07-10 | Discharge: 2020-07-10 | Disposition: A | Discharge status: Home / Self Care | Payer: PPO | Attending: Gastroenterology | Admitting: Gastroenterology

## 2020-07-10 DIAGNOSIS — K222 Esophageal obstruction: Secondary | ICD-10-CM | POA: Insufficient documentation

## 2020-07-10 DIAGNOSIS — Z8501 Personal history of malignant neoplasm of esophagus: Secondary | ICD-10-CM | POA: Diagnosis not present

## 2020-07-10 DIAGNOSIS — Z7984 Long term (current) use of oral hypoglycemic drugs: Secondary | ICD-10-CM | POA: Diagnosis not present

## 2020-07-10 DIAGNOSIS — R1310 Dysphagia, unspecified: Secondary | ICD-10-CM | POA: Diagnosis not present

## 2020-07-10 DIAGNOSIS — I10 Essential (primary) hypertension: Secondary | ICD-10-CM | POA: Diagnosis not present

## 2020-07-10 DIAGNOSIS — Z9889 Other specified postprocedural states: Secondary | ICD-10-CM | POA: Diagnosis not present

## 2020-07-10 DIAGNOSIS — Z980 Intestinal bypass and anastomosis status: Secondary | ICD-10-CM | POA: Insufficient documentation

## 2020-07-10 DIAGNOSIS — E119 Type 2 diabetes mellitus without complications: Secondary | ICD-10-CM | POA: Diagnosis not present

## 2020-07-10 DIAGNOSIS — Z87891 Personal history of nicotine dependence: Secondary | ICD-10-CM | POA: Insufficient documentation

## 2020-07-10 DIAGNOSIS — E785 Hyperlipidemia, unspecified: Secondary | ICD-10-CM | POA: Diagnosis not present

## 2020-07-10 HISTORY — PX: BALLOON DILATION: SHX5330

## 2020-07-10 HISTORY — PX: ESOPHAGOGASTRODUODENOSCOPY (EGD) WITH PROPOFOL: SHX5813

## 2020-07-10 LAB — GLUCOSE, CAPILLARY: Glucose-Capillary: 105 mg/dL — ABNORMAL HIGH (ref 70–99)

## 2020-07-10 SURGERY — ESOPHAGOGASTRODUODENOSCOPY (EGD) WITH PROPOFOL
Anesthesia: Monitor Anesthesia Care

## 2020-07-10 MED ORDER — PROPOFOL 500 MG/50ML IV EMUL
INTRAVENOUS | Status: DC | PRN
  Administered 2020-07-10: 120 ug/kg/min via INTRAVENOUS

## 2020-07-10 MED ORDER — SODIUM CHLORIDE 0.9 % IV SOLN: INTRAVENOUS | Status: DC

## 2020-07-10 MED ORDER — LIDOCAINE 2% (20 MG/ML) 5 ML SYRINGE
INTRAMUSCULAR | Status: DC | PRN
  Administered 2020-07-10: 40 mg via INTRAVENOUS

## 2020-07-10 MED ORDER — LACTATED RINGERS IV SOLN: INTRAVENOUS | Status: DC

## 2020-07-10 MED ORDER — PROPOFOL 10 MG/ML IV BOLUS
INTRAVENOUS | Status: AC
  Filled 2020-07-10: qty 20

## 2020-07-10 MED ORDER — PROPOFOL 10 MG/ML IV BOLUS
INTRAVENOUS | Status: DC | PRN
  Administered 2020-07-10 (×2): 20 mg via INTRAVENOUS

## 2020-07-10 MED ORDER — PROPOFOL 500 MG/50ML IV EMUL
INTRAVENOUS | Status: AC
  Filled 2020-07-10: qty 50

## 2020-07-10 SURGICAL SUPPLY — 15 items

## 2020-07-10 NOTE — Anesthesia Postprocedure Evaluation (Signed)
Anesthesia Post Note  Patient: James Dunn  Procedure(s) Performed: ESOPHAGOGASTRODUODENOSCOPY (EGD) WITH PROPOFOL (N/A ) BALLOON DILATION (N/A )     Patient location during evaluation: PACU Anesthesia Type: MAC Level of consciousness: awake and alert and oriented Pain management: pain level controlled Vital Signs Assessment: post-procedure vital signs reviewed and stable Respiratory status: spontaneous breathing, nonlabored ventilation and respiratory function stable Cardiovascular status: stable and blood pressure returned to baseline Postop Assessment: no apparent nausea or vomiting Anesthetic complications: no   No complications documented.  Last Vitals:  Vitals:   07/10/20 0924 07/10/20 1115  BP: 136/80 (!) 109/56  Pulse: 60 68  Resp: 17 17  Temp: 36.6 C 36.6 C  SpO2: 99% 100%    Last Pain:  Vitals:   07/10/20 1115  TempSrc: Oral  PainSc: 0-No pain                 Caliann Leckrone A.

## 2020-07-10 NOTE — H&P (Signed)
James Dunn HPI: This is a 70 year old male here for repeat dilation of an anastomotic stricture at the esophago-gastric anastomosis.  He reports swallowing without difficulty since the last dilation.  Past Medical History:  Diagnosis Date  . Allergy   . Asthma   . Controlled type 2 diabetes mellitus without complication, without long-term current use of insulin ([place]) 06/25/2012  . Diverticulosis   . ED (erectile dysfunction) 12/28/2010  . Esophageal cancer ([place]) 09/22/2019  . Former smoker 06/25/2012  . GERD (gastroesophageal reflux disease) 2002  . History of colonic polyps 05/11/2016  . Hyperlipidemia associated with type 2 diabetes mellitus ([place]) 05/11/2016  . Hypertension associated with diabetes ([place]) 08/02/2017   denies  . Mild intermittent asthma without complication 5/57/3220  . Seasonal allergic rhinitis due to pollen 11/29/2016  . Thoracic aortic aneurysm ([facility])    Ascending thoracic aorta measures 4 cm diameter  . Tubular adenoma of colon 12/05/2016    Past Surgical History:  Procedure Laterality Date  . BALLOON DILATION N/A 03/20/2020   Procedure: BALLOON DILATION;  Surgeon: [name], MD;  Location: [facility];  Service: Endoscopy;  Laterality: N/A;  . BALLOON DILATION N/A 04/03/2020   Procedure: BALLOON DILATION;  Surgeon: [name], MD;  [facility];  Service: Endoscopy;  Laterality: N/A;  . BALLOON DILATION N/A 04/17/2020   Procedure: BALLOON DILATION;  Surgeon: [name], MD;  [facility];  Service: Endoscopy;  Laterality: N/A;  . BALLOON DILATION N/A 05/01/2020   Procedure: BALLOON DILATION;  Surgeon: [name], MD;  [facility];  Service: Endoscopy;  Laterality: N/A;  . BALLOON DILATION N/A 05/22/2020   Procedure: BALLOON DILATION;  Surgeon: [name], MD;  [facility];  Service: Endoscopy;  Laterality: N/A;  . BALLOON DILATION N/A 06/05/2020   Procedure: BALLOON DILATION;  Surgeon: [name], MD;   [facility];  Service: Endoscopy;  Laterality: N/A;  . BIOPSY  08/30/2019   Procedure: BIOPSY;  Surgeon: [name], MD;  [facility];  Service: Endoscopy;;  . CHEST TUBE INSERTION Left 01/01/2020   Procedure: CHEST TUBE INSERTION;  Surgeon: [name], MD;  Location: [place];  Service: Thoracic;  Laterality: Left;  . COMPLETE ESOPHAGECTOMY N/A 01/01/2020   Procedure: TRANSHIATAL TOTAL ESOPHAGECTOMY COMPLETE WITH NIMS TUBE;  Surgeon: [name], MD;  Location: [facility] OR;  Service: Thoracic;  Laterality: N/A;  . ESOPHAGOGASTRODUODENOSCOPY (EGD) WITH PROPOFOL N/A 08/30/2019   Procedure: ESOPHAGOGASTRODUODENOSCOPY (EGD) WITH PROPOFOL;  Surgeon: [name], MD;  [facility];  Service: Endoscopy;  Laterality: N/A;  . ESOPHAGOGASTRODUODENOSCOPY (EGD) WITH PROPOFOL N/A 09/13/2019   Procedure: ESOPHAGOGASTRODUODENOSCOPY (EGD) WITH PROPOFOL;  Surgeon: [name], MD;  [facility];  Service: Endoscopy;  Laterality: N/A;  . ESOPHAGOGASTRODUODENOSCOPY (EGD) WITH PROPOFOL N/A 03/20/2020   Procedure: ESOPHAGOGASTRODUODENOSCOPY (EGD) WITH PROPOFOL;  Surgeon: [name], MD;  [facility];  Service: Endoscopy;  Laterality: N/A;  . ESOPHAGOGASTRODUODENOSCOPY (EGD) WITH PROPOFOL N/A 04/03/2020   Procedure: ESOPHAGOGASTRODUODENOSCOPY (EGD) WITH PROPOFOL;  Surgeon: [name], MD;  [facility];  Service: Endoscopy;  Laterality: N/A;  . ESOPHAGOGASTRODUODENOSCOPY (EGD) WITH PROPOFOL N/A 04/17/2020   Procedure: ESOPHAGOGASTRODUODENOSCOPY (EGD) WITH PROPOFOL;  Surgeon: [name], MD;  [facility];  Service: Endoscopy;  Laterality: N/A;  . ESOPHAGOGASTRODUODENOSCOPY (EGD) WITH PROPOFOL N/A 05/01/2020   Procedure: ESOPHAGOGASTRODUODENOSCOPY (EGD) WITH PROPOFOL;  Surgeon: [name], MD;  [facility];  Service: Endoscopy;  Laterality: N/A;  . ESOPHAGOGASTRODUODENOSCOPY (EGD) WITH PROPOFOL N/A 05/22/2020  Procedure: ESOPHAGOGASTRODUODENOSCOPY (EGD) WITH PROPOFOL;  Surgeon: [name], MD;  [facility];  Service: Endoscopy;  Laterality: N/A;  . ESOPHAGOGASTRODUODENOSCOPY (EGD) WITH PROPOFOL N/A 06/05/2020   Procedure: ESOPHAGOGASTRODUODENOSCOPY (EGD) WITH PROPOFOL;  Surgeon: [name], MD;  [facility];  Service: Endoscopy;  Laterality: N/A;  . JEJUNOSTOMY N/A 01/01/2020   Procedure: [name];  Surgeon: [name], MD;  Location: [place];  Service: Thoracic;  Laterality: N/A;  . SKIN BIOPSY N/A 03/04/2019   upper back Nerofibroma and follicular cyst   . SUBMUCOSAL INJECTION  08/30/2019   Procedure: SUBMUCOSAL INJECTION;  Surgeon: [name], MD;  [facility];  Service: Endoscopy;;  . UPPER ESOPHAGEAL ENDOSCOPIC ULTRASOUND (EUS) N/A 09/13/2019   Procedure: UPPER ESOPHAGEAL ENDOSCOPIC ULTRASOUND (EUS);  Surgeon: [name], MD;  Location: [facility];  Service: Endoscopy;  Laterality: N/A;  . VIDEO BRONCHOSCOPY N/A 01/01/2020   Procedure: VIDEO BRONCHOSCOPY WITH BRONCHIAL WASHING FOR CULTURES AND GRAM STAIN;  Surgeon: [name], MD;  Location: [facility];  Service: Thoracic;  Laterality: N/A;    Family History  Problem Relation Age of Onset  . Emphysema Mother   . AAA (abdominal aortic aneurysm) Father     Social History:  reports that he quit smoking about 15 months ago. His smoking use included cigarettes. He has never used smokeless tobacco. He reports previous alcohol use of about 7.0 standard drinks of alcohol per week. He reports that he does not use drugs.  Allergies: No Known Allergies  Medications:  Scheduled:  Continuous: . sodium chloride    . lactated ringers 10 mL/hr at 07/10/20 1008    Results for orders placed or performed during the hospital encounter of 07/10/20 (from the past 24 hour(s))  Glucose, capillary     Status: Abnormal   Collection Time: 07/10/20  9:55 AM  Result Value Ref Range   Glucose-Capillary 105  (H) 70 - 99 mg/dL     No results found.  ROS:  As stated above in the HPI otherwise negative.  Blood pressure 136/80, pulse 60, temperature 97.9 F (36.6 C), temperature source Oral, resp. rate 17, height 5\' 7"  (1.702 m), weight 70.3 kg, SpO2 99 %.    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  Nailani Full D 07/10/2020, 10:38 AM

## 2020-07-10 NOTE — Discharge Instructions (Signed)

## 2020-07-10 NOTE — Op Note (Signed)
[facility] Patient Name: James Dunn Procedure Date: 07/10/2020 MRN: [id number] Attending MD: [name] , MD Date of Birth: 23-Oct-1950 CSN: [id number] Age: 70 Admit Type: Outpatient Procedure:                Upper GI endoscopy Indications:              Therapeutic procedure Providers:                [name], MD, Cleda Daub, RN, Laverda Sorenson,                            Technician, Jefm Miles CRNA Referring MD:              Medicines:                Propofol per Anesthesia Complications:            No immediate complications. Estimated Blood Loss:     Estimated blood loss was minimal. Procedure:                Pre-Anesthesia Assessment:                           - Prior to the procedure, a History and Physical                            was performed, and patient medications and                            allergies were reviewed. The patient's tolerance of                            previous anesthesia was also reviewed. The risks                            and benefits of the procedure and the sedation                            options and risks were discussed with the patient.                            All questions were answered, and informed consent                            was obtained. Prior Anticoagulants: The patient has                            taken no previous anticoagulant or antiplatelet                            agents. ASA Grade Assessment: II - A patient with                            mild systemic disease. After reviewing the risks  and benefits, the patient was deemed in                            satisfactory condition to undergo the procedure.                           - Sedation was administered by an anesthesia                            professional. Deep sedation was attained.                           After obtaining informed consent, the endoscope was                            passed under  direct vision. Throughout the                            procedure, the patient's blood pressure, pulse, and                            oxygen saturations were monitored continuously. The                            [id number]-[id number] [id number]) was introduced through the                            mouth, and advanced to the third part of duodenum.                            The upper GI endoscopy was accomplished without                            difficulty. The patient tolerated the procedure                            well. Scope In: Scope Out: Findings:      An esophago-gastric anastomosis was found in the upper third of the       esophagus. A TTS dilator was passed through the scope. Dilation with a       12-13.5-15 mm balloon dilator was performed to 15 mm. The dilation site       was examined and showed complete resolution of luminal narrowing.       Estimated blood loss was minimal.      Evidence of a previous surgical anastomosis was found in the gastric       body.      The examined duodenum was normal.      Spontaneous passage of the endoscope was not possible through the       stricture. Incremental balloon dilation was performed starting at 12 mm       up to 15 mm. After dilation at 12 mm the endoscope still did not pass       through. Only after the 13.5 mm dilation was the endoscope able to move       through the stricture. Further  dilation was performed up to 15 mm in a       slow and incremental fashion. Close observation of the mucosal tear was       observed. After the 15 mm dilation the mucosal break was not as deep as       it was with prior dilations at lower diameters. Impression:               - An esophago-gastric anastomosis was found.                            Dilated.                           - A previous surgical anastomosis was found.                           - Normal examined duodenum.                           - No specimens collected. Moderate Sedation:       Not Applicable - Patient had care per Anesthesia. Recommendation:           - Patient has a contact number available for                            emergencies. The signs and symptoms of potential                            delayed complications were discussed with the                            patient. Return to normal activities tomorrow.                            Written discharge instructions were provided to the                            patient.                           - Resume previous diet.                           - Continue present medications.                           - Repeat upper endoscopy PRN for retreatment. Procedure Code(s):        --- Professional ---                           [phone], Esophagogastroduodenoscopy, flexible,                            transoral; with transendoscopic balloon dilation of                            esophagus (less than 30 mm diameter) Diagnosis  Code(s):        --- Professional ---                           K22.2, Esophageal obstruction                           Z98.890, Other specified postprocedural states                           Z98.0, Intestinal bypass and anastomosis status CPT copyright 2019 [employer]. All rights reserved. The codes documented in this report are preliminary and upon coder review may  be revised to meet current compliance requirements. [name], MD [name], MD 07/10/2020 11:18:38 AM This report has been signed electronically. Number of Addenda: 0

## 2020-07-10 NOTE — Anesthesia Procedure Notes (Signed)
Procedure Name: MAC Date/Time: 07/10/2020 10:52 AM Performed by: Eben Burow, CRNA Pre-anesthesia Checklist: Patient identified, Emergency Drugs available, Suction available, Patient being monitored and Timeout performed Oxygen Delivery Method: Simple face mask Placement Confirmation: positive ETCO2

## 2020-07-10 NOTE — Transfer of Care (Signed)
Immediate Anesthesia Transfer of Care Note  Patient: James Dunn  Procedure(s) Performed: ESOPHAGOGASTRODUODENOSCOPY (EGD) WITH PROPOFOL (N/A ) BALLOON DILATION (N/A )  Patient Location: PACU and Endoscopy Unit  Anesthesia Type:MAC  Level of Consciousness: awake, alert  and oriented  Airway & Oxygen Therapy: Patient Spontanous Breathing and Patient connected to face mask oxygen  Post-op Assessment: Report given to RN and Post -op Vital signs reviewed and stable  Post vital signs: Reviewed and stable  Last Vitals:  Vitals Value Taken Time  BP 109/56 07/10/20 1115  Temp    Pulse 69 07/10/20 1115  Resp 19 07/10/20 1115  SpO2 99 % 07/10/20 1115  Vitals shown include unvalidated device data.  Last Pain:  Vitals:   07/10/20 1115  TempSrc:   PainSc: 0-No pain         Complications: No complications documented.

## 2020-07-11 ENCOUNTER — Encounter (HOSPITAL_COMMUNITY): Payer: Self-pay | Admitting: Gastroenterology

## 2020-07-20 ENCOUNTER — Ambulatory Visit (INDEPENDENT_AMBULATORY_CARE_PROVIDER_SITE_OTHER): Payer: PPO | Admitting: Family Medicine

## 2020-07-20 ENCOUNTER — Other Ambulatory Visit: Payer: Self-pay

## 2020-07-20 ENCOUNTER — Encounter: Payer: Self-pay | Admitting: Family Medicine

## 2020-07-20 VITALS — BP 98/60 | HR 66 | Temp 97.1°F | Ht 67.0 in | Wt 150.6 lb

## 2020-07-20 DIAGNOSIS — M7582 Other shoulder lesions, left shoulder: Secondary | ICD-10-CM

## 2020-07-20 DIAGNOSIS — E1169 Type 2 diabetes mellitus with other specified complication: Secondary | ICD-10-CM

## 2020-07-20 DIAGNOSIS — Z86010 Personal history of colonic polyps: Secondary | ICD-10-CM

## 2020-07-20 DIAGNOSIS — I152 Hypertension secondary to endocrine disorders: Secondary | ICD-10-CM

## 2020-07-20 DIAGNOSIS — E785 Hyperlipidemia, unspecified: Secondary | ICD-10-CM

## 2020-07-20 DIAGNOSIS — E1159 Type 2 diabetes mellitus with other circulatory complications: Secondary | ICD-10-CM

## 2020-07-20 DIAGNOSIS — C155 Malignant neoplasm of lower third of esophagus: Secondary | ICD-10-CM

## 2020-07-20 DIAGNOSIS — J4520 Mild intermittent asthma, uncomplicated: Secondary | ICD-10-CM

## 2020-07-20 DIAGNOSIS — E119 Type 2 diabetes mellitus without complications: Secondary | ICD-10-CM | POA: Diagnosis not present

## 2020-07-20 DIAGNOSIS — J301 Allergic rhinitis due to pollen: Secondary | ICD-10-CM

## 2020-07-20 DIAGNOSIS — R931 Abnormal findings on diagnostic imaging of heart and coronary circulation: Secondary | ICD-10-CM

## 2020-07-20 LAB — POCT GLYCOSYLATED HEMOGLOBIN (HGB A1C): Hemoglobin A1C: 6.6 % — AB (ref 4.0–5.6)

## 2020-07-20 MED ORDER — METFORMIN HCL 500 MG PO TABS: 500.0000 mg | ORAL_TABLET | Freq: Two times a day (BID) | ORAL | 1 refills | Status: DC

## 2020-07-20 MED ORDER — ATORVASTATIN CALCIUM 10 MG PO TABS: 10.0000 mg | ORAL_TABLET | Freq: Every day | ORAL | 3 refills | Status: DC

## 2020-07-20 NOTE — Progress Notes (Signed)
James Dunn is a 70 y.o. male who presents for annual wellness visit and follow-up on chronic medical conditions.  He has a several month history of left shoulder discomfort.  He notes especially when he externally and internally rotates his shoulder as well as abducts he can have some pain.  Sometimes it bothers him at night.  It does not interfere with his playing golf.  He recently had an endoscopy and esophageal dilatation.  He will go back here on an as-needed basis.  He is now taking his Metformin but having to crush it up because of the difficulty with swallowing.  He states that he checks his blood sugars roughly once per week.  Presently he is not on an ACE/ARB due to blood pressure being relatively low.  His allergies seem to be under good control and is not using any OTC meds.  He does use albuterol on an as-needed basis.  He does have a previous history of colonic polyps and also diverticulosis was noted. His weight is relatively stable.  Immunizations and Health Maintenance Immunization History  Administered Date(s) Administered  . DTaP 11/21/1991  . Fluad Quad(high Dose 65+) 02/26/2019, 03/16/2020  . Influenza Split 06/28/2011  . Influenza, High Dose Seasonal PF 05/21/2015, 03/30/2016, 04/04/2017, 05/22/2018  . Influenza, Seasonal, Injecte, Preservative Fre 07/10/2012  . Influenza,inj,Quad PF,6+ Mos 04/02/2013  . Moderna Sars-Covid-2 Vaccination 08/04/2019, 09/02/2019, 02/24/2020  . Pneumococcal Conjugate-13 05/11/2016  . Pneumococcal Polysaccharide-23 01/17/2001  . Tdap 12/03/2009, 07/08/2019  . Zoster 11/21/2011, 07/18/2017  . Zoster Recombinat (Shingrix) 07/18/2017, 09/21/2017   Health Maintenance Due  Topic Date Due  . FOOT EXAM  08/02/2018  . URINE MICROALBUMIN  07/07/2020    Last colonoscopy: 1/22 endoscopy Last PSA: unknown Dentist: over a year Ophtho: 04/13/20 Exercise: Ride bike and golf  Other doctors caring for patient include: Dr. Benson Dunn GI, Dr. Katy Dunn eye. Dr.  Servando Dunn cardiothorac, Dr. Luis Dunn oncology  Advanced Directives: Does Patient Have a Medical Advance Directive?: Yes Type of Advance Directive: Living will  Depression screen:  See questionnaire below.     Depression screen [facility] 2/9 07/20/2020 10/10/2018 08/16/2016 08/02/2016 05/11/2016  Decreased Interest 0 0 0 0 0  Down, Depressed, Hopeless 0 0 0 0 0  PHQ - 2 Score 0 0 0 0 0    Fall Screen: See Questionaire below.   Fall Risk  07/20/2020 10/10/2018 11/30/2017 08/16/2016 08/02/2016  Falls in the past year? 0 0 No No No    ADL screen:  See questionnaire below.  Functional Status Survey: Is the patient deaf or have difficulty hearing?: No Does the patient have difficulty seeing, even when wearing glasses/contacts?: No Does the patient have difficulty concentrating, remembering, or making decisions?: No Does the patient have difficulty walking or climbing stairs?: No Does the patient have difficulty dressing or bathing?: No Does the patient have difficulty doing errands alone such as visiting a doctor's office or shopping?: No   Review of Systems  Constitutional: -, -unexpected weight change, -anorexia, -fatigue Allergy: -sneezing, -itching, -congestion Dermatology: denies changing moles, rash, lumps ENT: -runny nose, -ear pain, -sore throat,  Cardiology:  -chest pain, -palpitations, -orthopnea, Respiratory: -cough, -shortness of breath, -dyspnea on exertion, -wheezing,   Hematology: -bleeding or bruising problems Musculoskeletal: -arthralgias, -myalgias, -joint swelling, -back pain, - Ophthalmology: -vision changes,  Urology: -dysuria, -difficulty urinating,  -urinary frequency, -urgency, incontinence Neurology: -, -numbness, , -memory loss, -falls, -dizziness    PHYSICAL EXAM:  BP 98/60   Pulse 66   Temp (!)  97.1 F (36.2 C)   Ht 5\' 7"  (1.702 m)   Wt 150 lb 9.6 oz (68.3 kg)   SpO2 94%   BMI 23.59 kg/m   General Appearance: Alert, cooperative, no distress, appears stated  age Head: Normocephalic, without obvious abnormality, atraumatic Eyes: PERRL, conjunctiva/corneas clear, EOM's intact, Ears: Normal TM's and external ear canals Nose: Nares normal, mucosa normal, no drainage or sinus   tenderness Throat: Lips, mucosa, and tongue normal; teeth and gums normal Neck: Supple, no lymphadenopathy, thyroid:no enlargement/tenderness/nodules; no carotid bruit or JVD Lungs: Clear to auscultation bilaterally without wheezes, rales or ronchi; respirations unlabored Heart: Regular rate and rhythm, S1 and S2 normal, no murmur, rub or gallop Abdomen: Soft, non-tender, nondistended, normoactive bowel sounds, no masses, no hepatosplenomegaly Extremities: No clubbing, cyanosis or edema.  Exam of the left shoulder does show positive Neer's and Hawkins test.  Negative sulcus sign.  No tenderness over AC joint. Pulses: 2+ and symmetric all extremities Skin: Skin color, texture, turgor normal, no rashes or lesions Lymph nodes: Cervical, supraclavicular, and axillary nodes normal Neurologic: CNII-XII intact, normal strength, sensation and gait; reflexes 2+ and symmetric throughout   Psych: Normal mood, affect, hygiene and grooming Hemoglobin A1c is 6.6. 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  He will continue to be followed by the various specialists.  I will place him on Lipitor as he is at risk for  cardiovascular issues.  I would not put him on a ACE/ARB at the present time due to his blood pressure.  No therapy needed for his allergies and we will continue on albuterol as needed.  He will follow up with GI especially with his swallowing difficulties. I discussed the rotator cuff tendinitis in regard to injections versus physical therapy and at the present time he is not interested in pursuing that.  We also discussed the colonic polyps which will need to be reevaluated in several years.  Immunization recommendations discussed.  Colonoscopy recommendations reviewed.   Medicare Attestation I have personally reviewed: The patient's medical and social history Their use of alcohol, tobacco or illicit drugs Their current medications and supplements The patient's functional ability including ADLs,fall risks, home safety risks, cognitive, and hearing and visual impairment Diet and physical activities Evidence for depression or mood disorders  The patient's weight, height, and BMI have been recorded in the chart.  I have made referrals, counseling, and provided education to the patient based on review of the above and I have provided the patient with a written personalized care plan for preventive services.     Jill Alexanders, MD   07/20/2020

## 2020-07-20 NOTE — Patient Instructions (Signed)
  James Dunn , Thank you for taking time to come for your Medicare Wellness Visit. I appreciate your ongoing commitment to your health goals. Please review the following plan we discussed and let me know if I can assist you in the future.   These are the goals we discussed: Goals   None     This is a list of the screening recommended for you and due dates:  Health Maintenance  Topic Date Due  . Complete foot exam   08/02/2018  . Urine Protein Check  07/07/2020  . COVID-19 Vaccine (4 - Booster for Moderna series) 08/23/2020  . Hemoglobin A1C  09/13/2020  . Eye exam for diabetics  04/13/2021  . Colon Cancer Screening  12/01/2026  . Tetanus Vaccine  07/07/2029  . Flu Shot  Completed  .  Hepatitis C: One time screening is recommended by Center for Disease Control  (CDC) for  adults born from [birth year] through 1966.   Completed  . Pneumonia vaccines  Discontinued

## 2020-08-03 ENCOUNTER — Other Ambulatory Visit: Payer: Self-pay | Admitting: *Deleted

## 2020-08-03 ENCOUNTER — Other Ambulatory Visit: Payer: Self-pay

## 2020-08-03 ENCOUNTER — Other Ambulatory Visit: Payer: Self-pay | Admitting: Family Medicine

## 2020-08-03 ENCOUNTER — Inpatient Hospital Stay: Payer: PPO | Attending: Hematology

## 2020-08-03 DIAGNOSIS — K219 Gastro-esophageal reflux disease without esophagitis: Secondary | ICD-10-CM | POA: Diagnosis not present

## 2020-08-03 DIAGNOSIS — Z8501 Personal history of malignant neoplasm of esophagus: Secondary | ICD-10-CM | POA: Insufficient documentation

## 2020-08-03 DIAGNOSIS — E119 Type 2 diabetes mellitus without complications: Secondary | ICD-10-CM | POA: Diagnosis not present

## 2020-08-03 DIAGNOSIS — Z79899 Other long term (current) drug therapy: Secondary | ICD-10-CM | POA: Diagnosis not present

## 2020-08-03 DIAGNOSIS — R1310 Dysphagia, unspecified: Secondary | ICD-10-CM | POA: Insufficient documentation

## 2020-08-03 DIAGNOSIS — I10 Essential (primary) hypertension: Secondary | ICD-10-CM | POA: Diagnosis not present

## 2020-08-03 DIAGNOSIS — E1169 Type 2 diabetes mellitus with other specified complication: Secondary | ICD-10-CM

## 2020-08-03 DIAGNOSIS — C155 Malignant neoplasm of lower third of esophagus: Secondary | ICD-10-CM

## 2020-08-03 DIAGNOSIS — E785 Hyperlipidemia, unspecified: Secondary | ICD-10-CM

## 2020-08-03 DIAGNOSIS — R970 Elevated carcinoembryonic antigen [CEA]: Secondary | ICD-10-CM | POA: Diagnosis not present

## 2020-08-03 DIAGNOSIS — Z87891 Personal history of nicotine dependence: Secondary | ICD-10-CM | POA: Diagnosis not present

## 2020-08-03 DIAGNOSIS — F1021 Alcohol dependence, in remission: Secondary | ICD-10-CM | POA: Insufficient documentation

## 2020-08-03 LAB — CBC WITH DIFFERENTIAL (CANCER CENTER ONLY)
Abs Immature Granulocytes: 0.02 10*3/uL (ref 0.00–0.07)
Basophils Absolute: 0 10*3/uL (ref 0.0–0.1)
Basophils Relative: 1 %
Eosinophils Absolute: 0.1 10*3/uL (ref 0.0–0.5)
Eosinophils Relative: 3 %
HCT: 39.2 % (ref 39.0–52.0)
Hemoglobin: 13.2 g/dL (ref 13.0–17.0)
Immature Granulocytes: 0 %
Lymphocytes Relative: 20 %
Lymphs Abs: 1.1 10*3/uL (ref 0.7–4.0)
MCH: 30.5 pg (ref 26.0–34.0)
MCHC: 33.7 g/dL (ref 30.0–36.0)
MCV: 90.5 fL (ref 80.0–100.0)
Monocytes Absolute: 0.4 10*3/uL (ref 0.1–1.0)
Monocytes Relative: 8 %
Neutro Abs: 3.7 10*3/uL (ref 1.7–7.7)
Neutrophils Relative %: 68 %
Platelet Count: 205 10*3/uL (ref 150–400)
RBC: 4.33 MIL/uL (ref 4.22–5.81)
RDW: 13.4 % (ref 11.5–15.5)
WBC Count: 5.4 10*3/uL (ref 4.0–10.5)
nRBC: 0 % (ref 0.0–0.2)

## 2020-08-03 LAB — CMP (CANCER CENTER ONLY)
ALT: 21 U/L (ref 0–44)
AST: 18 U/L (ref 15–41)
Albumin: 4.3 g/dL (ref 3.5–5.0)
Alkaline Phosphatase: 78 U/L (ref 38–126)
Anion gap: 7 (ref 5–15)
BUN: 11 mg/dL (ref 8–23)
CO2: 28 mmol/L (ref 22–32)
Calcium: 9.3 mg/dL (ref 8.9–10.3)
Chloride: 104 mmol/L (ref 98–111)
Creatinine: 1.04 mg/dL (ref 0.61–1.24)
GFR, Estimated: >60 mL/min (ref >60)
Glucose, Bld: 124 mg/dL — ABNORMAL HIGH (ref 70–99)
Potassium: 4.3 mmol/L (ref 3.5–5.1)
Sodium: 139 mmol/L (ref 135–145)
Total Bilirubin: 0.6 mg/dL (ref 0.3–1.2)
Total Protein: 7.5 g/dL (ref 6.5–8.1)

## 2020-08-03 LAB — IRON AND TIBC
Iron: 99 ug/dL (ref 42–163)
Saturation Ratios: 27 % (ref 20–55)
TIBC: 368 ug/dL (ref 202–409)
UIBC: 269 ug/dL (ref 117–376)

## 2020-08-03 LAB — FERRITIN: Ferritin: 87 ng/mL (ref 24–336)

## 2020-08-03 LAB — CEA (IN HOUSE-CHCC): CEA (CHCC-In House): 7.45 ng/mL — ABNORMAL HIGH (ref 0.00–5.00)

## 2020-08-04 LAB — LIPID PANEL
Chol/HDL Ratio: 2.9 ratio (ref 0.0–5.0)
Cholesterol, Total: 121 mg/dL (ref 100–199)
HDL: 42 mg/dL (ref >39)
LDL Chol Calc (NIH): 63 mg/dL (ref 0–99)
Triglycerides: 78 mg/dL (ref 0–149)
VLDL Cholesterol Cal: 16 mg/dL (ref 5–40)

## 2020-08-05 ENCOUNTER — Encounter (HOSPITAL_COMMUNITY): Payer: Self-pay

## 2020-08-05 ENCOUNTER — Ambulatory Visit (HOSPITAL_COMMUNITY)
Admission: RE | Admit: 2020-08-05 | Discharge: 2020-08-05 | Disposition: A | Discharge status: Home / Self Care | Payer: PPO | Source: Ambulatory Visit | Attending: Hematology | Admitting: Hematology

## 2020-08-05 ENCOUNTER — Other Ambulatory Visit: Payer: Self-pay

## 2020-08-05 DIAGNOSIS — I700 Atherosclerosis of aorta: Secondary | ICD-10-CM | POA: Diagnosis not present

## 2020-08-05 DIAGNOSIS — C155 Malignant neoplasm of lower third of esophagus: Secondary | ICD-10-CM | POA: Insufficient documentation

## 2020-08-05 DIAGNOSIS — C159 Malignant neoplasm of esophagus, unspecified: Secondary | ICD-10-CM | POA: Diagnosis not present

## 2020-08-05 DIAGNOSIS — I2510 Atherosclerotic heart disease of native coronary artery without angina pectoris: Secondary | ICD-10-CM | POA: Diagnosis not present

## 2020-08-05 DIAGNOSIS — K8689 Other specified diseases of pancreas: Secondary | ICD-10-CM | POA: Diagnosis not present

## 2020-08-05 MED ORDER — IOHEXOL 300 MG/ML  SOLN
100.0000 mL | Freq: Once | INTRAMUSCULAR | Status: AC | PRN
  Administered 2020-08-05: 100 mL via INTRAVENOUS

## 2020-08-07 NOTE — Progress Notes (Signed)
[place]   Telephone:(336) [phone] Fax:(336) [phone]   Clinic Follow up Note   Patient Care Team: Denita Lung, MD as PCP - General (Family Medicine) [name], MD as Consulting Physician (Gastroenterology) [name], MD as Consulting Physician (Hematology) Minus Breeding, MD as Consulting Physician (Cardiology)  Date of Service:  08/10/2020  CHIEF COMPLAINT: F/u ofEsophagus cancer  SUMMARY OF ONCOLOGIC HISTORY: Oncology History Overview Note  Cancer Staging Esophageal cancer [facility]) Staging form: Esophagus - Adenocarcinoma, AJCC 8th Edition - Clinical stage from 09/13/2019: Stage IVA (cT3, cN2, cM0) - Signed by [name], MD on 09/22/2019 - Pathologic stage from 01/07/2020: Stage I (ypT0, pN0, cM0, G2) - Signed by [name], MD on 01/07/2020    Esophageal cancer ([place])  08/30/2019 Procedure   EGD by Dr. [name] 08/30/19  IMPRESSION - Partially obstructing, malignant esophageal tumor was found in the lower third of the esophagus. Biopsied. Injected. - Normal stomach. - Normal examined duodenum.   08/30/2019 Initial Biopsy   FINAL MICROSCOPIC DIAGNOSIS:   A. ESOPHAGUS, BIOPSY:  - At least intramucosal adenocarcinoma.  - See comment.   COMMENT:   - The depth of invasion can not be determined due to the superficial  nature of the biopsy.  Dr.  Vic Ripper has reviewed the case and concurs  with this interpretation.  Additional studies can be performed upon  clinician request.    09/05/2019 Imaging   CT CAP W Contrast 09/05/19  IMPRESSION: 1. Soft tissue mass noted distal esophagus, compatible with known neoplasm. There is a small 6 mm short axis adjacent paraesophageal node, concerning for metastatic disease. 2. Upper normal to mildly enlarged hepatoduodenal ligament lymph node, with upper normal right para-aortic nodes. As metastatic disease cannot be excluded, PET-CT may prove helpful to further evaluate. 3. 1.9 x 1.3 cm nodular collection of  soft tissue posterior to the descending duodenum. This is in close proximity to the pancreatic head but a definite communication of parenchyma between these 2 structures is not discernible by CT. 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 Attention on follow-up recommended. 4. 6 mm perifissural nodule right middle lobe, likely a subpleural lymph node. Attention on follow-up recommended. 5. Hepatomegaly with hepatic steatosis. 6. Ascending thoracic aorta measures 4 cm diameter. Recommend annual imaging followup by CTA or MRA. This recommendation follows 2010 ACCF/AHA/AATS/ACR/ASA/SCA/SCAI/SIR/STS/SVM Guidelines for the Diagnosis and Management of Patients with Thoracic Aortic Disease. Circulation. 2010; 121: G626-R485. Aortic aneurysm NOS (ICD10-I71.9) 7. Left-sided IVC, normal variant.   09/11/2019 Tumor Marker   Baseline CEA 25.12 on 09/11/19   09/13/2019 Cancer Staging   Staging form: Esophagus - Adenocarcinoma, AJCC 8th Edition - Clinical stage from 09/13/2019: Stage IVA (cT3, cN2, cM0) - Signed by [name], MD on 09/22/2019   09/13/2019 Procedure   EUS by Dr. [name] 09/13/19  IMPRESSION - A mass was found in the lower third of the esophagus. A tissue diagnosis was obtained prior to this exam. This is of adenocarcinoma. This was staged T3 N3 Mx by endosonographic criteria. - No specimens collected.   09/20/2019 PET scan   PET IMPRESSION: 1. Hypermetabolic distal esophageal primary. 2. Low-level hypermetabolism within upper abdominal nodes. These are technically nonspecific, but favored to be reactive in the setting of hepatic steatosis and hepatomegaly. 3. Otherwise, no evidence of metastatic disease. 4. Anal hypermetabolism could be physiologic. Consider correlation with physical exam and colonoscopy, if patient is not up-to-date. 5. Incidental findings, including: Aortic atherosclerosis (ICD10-I70.0), coronary artery atherosclerosis and  emphysema (ICD10-J43.9). Sinus disease and bilateral mastoid effusions.   09/22/2019 Initial Diagnosis   Esophageal cancer ([place])   09/30/2019 - 11/04/2019 Chemotherapy   concurrent ChemoRT with weekly Carboplatin and Taxol starting 09/30/19. Last dose was Taxol alone given neutropenia.    09/30/2019 - 11/06/2019 Radiation Therapy   concurrent ChemoRT with Dr. Lisbeth Renshaw starting 09/30/19-11/06/19   12/05/2019 Imaging   CT CAP w Contrast  IMPRESSION: 1. Interval response to therapy. Approximately 50% reduction in size of distal esophageal mass 2. No new or progressive disease identified within the chest or abdomen. 3. Emphysema and aortic atherosclerosis. 4. Left main and 3 vessel coronary artery calcifications.   Aortic Atherosclerosis (ICD10-I70.0) and Emphysema (ICD10-J43.9).     01/01/2020 Surgery   VIDEO BRONCHOSCOPY WITH BRONCHIAL WASHING FOR CULTURES AND GRAM STAIN and TRANSHIATAL TOTAL ESOPHAGECTOMY COMPLETE WITH NIMS TUBE and JEJUNOSTOMY and CHEST TUBE INSERTION with Dr Servando Snare.    01/01/2020 Pathology Results   FINAL MICROSCOPIC DIAGNOSIS:   A. ESOPHAGUS AND PARTIAL STOMACH, ESOPHAGECTOMY:  - No residual carcinoma identified.  - Reactive changes, mixed inflammation, and ulcer.  - Sixteen of sixteen lymph nodes negative for carcinoma (0/16).  - See oncology table.    01/07/2020 Cancer Staging   Staging form: Esophagus - Adenocarcinoma, AJCC 8th Edition - Pathologic stage from 01/07/2020: Stage I (ypT0, pN0, cM0, G2) - Signed by [name], MD on 01/07/2020   08/05/2020 Imaging   CT CAP  IMPRESSION: 1. No evidence of thoracic metastasis. 2. Gastric pull-up anatomy without evidence of local recurrence. 3. No evidence of metastatic disease in the abdomen pelvis. 4. Moderate volume stool throughout the colon. 5. Aortic Atherosclerosis (ICD10-I70.0).        CURRENT THERAPY:  Surveillance   INTERVAL HISTORY:  ALEXA GOLEBIEWSKI is here for a follow up. He was last seen by me  6 months ago. He presents to the clinic with his wife.  He is clinically doing very well, eats a small meal frequently, and weight.  He has good energy level, plays golf.  He denies any pain, shortness of breath, or GI symptoms.  He has had several esophageal dilatation by Dr. [name] and his swallowing improved after procedure.  All other systems were reviewed with the patient and are negative.  MEDICAL HISTORY:  Past Medical History:  Diagnosis Date  . Allergy   . Asthma   . Controlled type 2 diabetes mellitus without complication, without long-term current use of insulin ([place]) 06/25/2012  . Diverticulosis   . ED (erectile dysfunction) 12/28/2010  . Esophageal cancer ([place]) 09/22/2019  . Former smoker 06/25/2012  . GERD (gastroesophageal reflux disease) 2002  . History of colonic polyps 05/11/2016  . Hyperlipidemia associated with type 2 diabetes mellitus ([place]) 05/11/2016  . Hypertension associated with diabetes ([place]) 08/02/2017   denies  . Mild intermittent asthma without complication 5/99/3570  . Seasonal allergic rhinitis due to pollen 11/29/2016  . Thoracic aortic aneurysm ([facility])    Ascending thoracic aorta measures 4 cm diameter  . Tubular adenoma of colon 12/05/2016    SURGICAL HISTORY: Past Surgical History:  Procedure Laterality Date  . BALLOON DILATION N/A 03/20/2020   Procedure: BALLOON DILATION;  Surgeon: [name], MD;  Location: [facility];  Service: Endoscopy;  Laterality: N/A;  . BALLOON DILATION N/A 04/03/2020   Procedure: BALLOON DILATION;  Surgeon: [name], MD;  Location: [facility];  Service: Endoscopy;  Laterality: N/A;  . BALLOON DILATION N/A 04/17/2020   Procedure: BALLOON DILATION;  Surgeon: [name],  MD;  Location: [facility];  Service: Endoscopy;  Laterality: N/A;  . BALLOON DILATION N/A 05/01/2020   Procedure: BALLOON DILATION;  Surgeon: [name], MD;  Location: [facility];  Service: Endoscopy;  Laterality: N/A;  . BALLOON DILATION N/A  05/22/2020   Procedure: BALLOON DILATION;  Surgeon: [name], MD;  Location: [facility];  Service: Endoscopy;  Laterality: N/A;  . BALLOON DILATION N/A 06/05/2020   Procedure: BALLOON DILATION;  Surgeon: [name], MD;  Location: [facility];  Service: Endoscopy;  Laterality: N/A;  . BALLOON DILATION N/A 07/10/2020   Procedure: BALLOON DILATION;  Surgeon: [name], MD;  Location: [facility];  Service: Endoscopy;  Laterality: N/A;  . BIOPSY  08/30/2019   Procedure: BIOPSY;  Surgeon: [name], MD;  Location: [facility];  Service: Endoscopy;;  . CHEST TUBE INSERTION Left 01/01/2020   Procedure: CHEST TUBE INSERTION;  Surgeon: [name], MD;  Location: [place];  Service: Thoracic;  Laterality: Left;  . COMPLETE ESOPHAGECTOMY N/A 01/01/2020   Procedure: TRANSHIATAL TOTAL ESOPHAGECTOMY COMPLETE WITH NIMS TUBE;  Surgeon: [name], MD;  Location[facility] OR;  Service: Thoracic;  Laterality: N/A;  . ESOPHAGOGASTRODUODENOSCOPY (EGD) WITH PROPOFOL N/A 08/30/2019   Procedure: ESOPHAGOGASTRODUODENOSCOPY (EGD) WITH PROPOFOL;  Surgeon: [name], MD;  Location: [facility];  Service: Endoscopy;  Laterality: N/A;  . ESOPHAGOGASTRODUODENOSCOPY (EGD) WITH PROPOFOL N/A 09/13/2019   Procedure: ESOPHAGOGASTRODUODENOSCOPY (EGD) WITH PROPOFOL;  Surgeon: [name], MD;  Location: [facility];  Service: Endoscopy;  Laterality: N/A;  . ESOPHAGOGASTRODUODENOSCOPY (EGD) WITH PROPOFOL N/A 03/20/2020   Procedure: ESOPHAGOGASTRODUODENOSCOPY (EGD) WITH PROPOFOL;  Surgeon: [name], MD;  Location: [facility];  Service: Endoscopy;  Laterality: N/A;  . ESOPHAGOGASTRODUODENOSCOPY (EGD) WITH PROPOFOL N/A 04/03/2020   Procedure: ESOPHAGOGASTRODUODENOSCOPY (EGD) WITH PROPOFOL;  Surgeon: [name], MD;  Location: [facility];  Service: Endoscopy;  Laterality: N/A;  . ESOPHAGOGASTRODUODENOSCOPY (EGD) WITH PROPOFOL N/A 04/17/2020   Procedure: ESOPHAGOGASTRODUODENOSCOPY (EGD) WITH  PROPOFOL;  Surgeon: [name], MD;  Location: [facility];  Service: Endoscopy;  Laterality: N/A;  . ESOPHAGOGASTRODUODENOSCOPY (EGD) WITH PROPOFOL N/A 05/01/2020   Procedure: ESOPHAGOGASTRODUODENOSCOPY (EGD) WITH PROPOFOL;  Surgeon: [name], MD;  Location: [facility];  Service: Endoscopy;  Laterality: N/A;  . ESOPHAGOGASTRODUODENOSCOPY (EGD) WITH PROPOFOL N/A 05/22/2020   Procedure: ESOPHAGOGASTRODUODENOSCOPY (EGD) WITH PROPOFOL;  Surgeon: [name], MD;  Location: [facility];  Service: Endoscopy;  Laterality: N/A;  . ESOPHAGOGASTRODUODENOSCOPY (EGD) WITH PROPOFOL N/A 06/05/2020   Procedure: ESOPHAGOGASTRODUODENOSCOPY (EGD) WITH PROPOFOL;  Surgeon: [name], MD;  Location: [facility];  Service: Endoscopy;  Laterality: N/A;  . ESOPHAGOGASTRODUODENOSCOPY (EGD) WITH PROPOFOL N/A 07/10/2020   Procedure: ESOPHAGOGASTRODUODENOSCOPY (EGD) WITH PROPOFOL;  Surgeon: [name], MD;  Location: [facility];  Service: Endoscopy;  Laterality: N/A;  . JEJUNOSTOMY N/A 01/01/2020   Procedure: [name];  Surgeon: [name], MD;  Location: [place];  Service: Thoracic;  Laterality: N/A;  . SKIN BIOPSY N/A 03/04/2019   upper back Nerofibroma and follicular cyst   . SUBMUCOSAL INJECTION  08/30/2019   Procedure: SUBMUCOSAL INJECTION;  Surgeon: [name], MD;  Location: [facility];  Service: Endoscopy;;  . UPPER ESOPHAGEAL ENDOSCOPIC ULTRASOUND (EUS) N/A 09/13/2019   Procedure: UPPER ESOPHAGEAL ENDOSCOPIC ULTRASOUND (EUS);  Surgeon: [name], MD;  Location: [facility];  Service: Endoscopy;  Laterality: N/A;  . VIDEO BRONCHOSCOPY N/A 01/01/2020   Procedure: VIDEO BRONCHOSCOPY WITH BRONCHIAL WASHING FOR CULTURES AND GRAM STAIN;  Surgeon: [name], MD;  Location: [place];  Service: Thoracic;  Laterality: N/A;  I have reviewed the social history and family history with the patient and they are unchanged from previous note.  ALLERGIES:  has No Known  Allergies.  MEDICATIONS:  Current Outpatient Medications  Medication Sig Dispense Refill  . albuterol (VENTOLIN HFA) 108 (90 Base) MCG/ACT inhaler Inhale 1 puff into the lungs every 6 (six) hours as needed for wheezing or shortness of breath.    Marland Kitchen atorvastatin (LIPITOR) 10 MG tablet Take 1 tablet (10 mg total) by mouth daily. 90 tablet 3  . esomeprazole (NEXIUM) 40 MG capsule 1 cap(s)    . metFORMIN (GLUCOPHAGE) 500 MG tablet Take 1 tablet (500 mg total) by mouth 2 (two) times daily with a meal. 180 tablet 1  . Misc Natural Products (ESSIAC TONIC) LIQD Take by mouth daily. Herbal Tea    . Multiple Vitamins-Minerals (MULTIVITAMIN WITH MINERALS) tablet Take 1 tablet by mouth daily.    . Nutritional Supplements (ENSURE CLEAR) LIQD Take 1 Can by mouth daily as needed (Extra Calories).     No current facility-administered medications for this visit.    PHYSICAL EXAMINATION: ECOG PERFORMANCE STATUS: 0 - Asymptomatic  Vitals:   08/10/20 0813  BP: 116/78  Pulse: 62  Resp: 16  Temp: 97.9 F (36.6 C)  SpO2: 99%   Filed Weights   08/10/20 0813  Weight: 157 lb 8 oz (71.4 kg)    GENERAL:alert, no distress and comfortable SKIN: skin color, texture, turgor are normal, no rashes or significant lesions EYES: normal, Conjunctiva are pink and non-injected, sclera clear Musculoskeletal:no cyanosis of digits and no clubbing  NEURO: alert & oriented x 3 with fluent speech, no focal motor/sensory deficits  LABORATORY DATA:  I have reviewed the data as listed CBC Latest Ref Rng & Units 08/03/2020 03/17/2020 02/03/2020  WBC 4.0 - 10.5 K/uL 5.4 4.6 5.5  Hemoglobin 13.0 - 17.0 g/dL 13.2 12.2(L) 10.9(L)  Hematocrit 39.0 - 52.0 % 39.2 37.6(L) 33.7(L)  Platelets 150 - 400 K/uL 205 179 213     CMP Latest Ref Rng & Units 08/03/2020 03/17/2020 02/03/2020  Glucose 70 - 99 mg/dL 124(H) 108(H) 315(H)  BUN 8 - 23 mg/dL 11 10 16   Creatinine 0.61 - 1.24 mg/dL 1.04 0.95 0.93  Sodium 135 - 145 mmol/L 139 138  134(L)  Potassium 3.5 - 5.1 mmol/L 4.3 4.4 5.0  Chloride 98 - 111 mmol/L 104 103 97(L)  CO2 22 - 32 mmol/L 28 32 26  Calcium 8.9 - 10.3 mg/dL 9.3 9.9 9.9  Total Protein 6.5 - 8.1 g/dL 7.5 7.4 6.9  Total Bilirubin 0.3 - 1.2 mg/dL 0.6 0.6 0.3  Alkaline Phos 38 - 126 U/L 78 84 101  AST 15 - 41 U/L 18 19 14(L)  ALT 0 - 44 U/L 21 23 18       RADIOGRAPHIC STUDIES: I have personally reviewed the radiological images as listed and agreed with the findings in the report. No results found.   ASSESSMENT & PLAN:  James Dunn is a 70 y.o. male with   1. Adenocarcinoma of distal esophagus, T3N2M0, stage IVA, ypT0N0  -His 3/12/21EGD showed a large, fungating, partially circumferential mass in the lower third of the esophagus causing dysphagia.His PET from 09/20/19 showedno evidence of metastatic disease. -HisEUS from 09/13/19 which showedlocally advanced adenocarcinoma invading the muscular layer with 2 enlarged LNs, T3N2M0. -He completed standard neoadjuvant concurrent ChemoRT with weekly Carboplatin and Taxol for 6 weeks 09/30/19-11/06/19.  -He proceeded with surgical resection by Dr Servando Snare on 01/01/20. Surgical path showed complete  response to neoadjuvant chemoRT with no residual disease in surgery.  -Given his complete response to chemo RT, I did not ecommend adjuvant nivolumab  - He is currently on surveillance. He is clinically doing very well. -Reviewed discussed his CT CAP from 08/05/20 which showed no evidence of recurrence. -Labs reviewed, unremarkable, except mildly elevated CEA which could be nonspecific -Continue cancer surveillance.  We discussed high risk of recurrence in the first 2 years -Follow-up in 3 months, plan to obtain next surveillance CT scan   2.Dysphagia -much improved -s/p multiple esophageal dilatations by Dr. [name] after surgery   3.H/oTobacco and alcohol use -He has long term tobacco abuse, quit in 03/2019. -He noted he has cut out alcohol in 09/2019,  still drinks some time. I recommend he continue cessation.   4. HTN, HL, DM, GERD -Continue medications and f/u with PCP.    PLAN: -Lab and scan reviewed, no evidence of recurrence.  He is clinically doing well. -He will follow-up with Dr. Pia Mau next month -Lab and follow-up in 3 months, will order restaging CT scan on next visit    No problem-specific Assessment & Plan notes found for this encounter.   No orders of the defined types were placed in this encounter.  All questions were answered. The patient knows to call the clinic with any problems, questions or concerns. No barriers to learning was detected. The total time spent in the appointment was 30 minutes.     [name], MD 08/10/2020   I, Joslyn Devon, am acting as scribe for [name], MD.   I have reviewed the above documentation for accuracy and completeness, and I agree with the above.

## 2020-08-10 ENCOUNTER — Inpatient Hospital Stay: Payer: PPO | Admitting: Hematology

## 2020-08-10 ENCOUNTER — Other Ambulatory Visit: Payer: Self-pay

## 2020-08-10 ENCOUNTER — Encounter: Payer: Self-pay | Admitting: Hematology

## 2020-08-10 ENCOUNTER — Telehealth: Payer: Self-pay | Admitting: Hematology

## 2020-08-10 VITALS — BP 116/78 | HR 62 | Temp 97.9°F | Resp 16 | Ht 67.0 in | Wt 157.5 lb

## 2020-08-10 DIAGNOSIS — C155 Malignant neoplasm of lower third of esophagus: Secondary | ICD-10-CM

## 2020-08-10 DIAGNOSIS — Z8501 Personal history of malignant neoplasm of esophagus: Secondary | ICD-10-CM | POA: Diagnosis not present

## 2020-08-10 NOTE — Telephone Encounter (Signed)
Left message with follow-up appointment per 2/21 los. Gave option to call back to reschedule if needed.

## 2020-09-02 NOTE — H&P (View-Only) (Signed)
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 SURGICAL PROCEDURE:   1.  Bronchoscopy.   2.  Transhiatal total esophagectomy with cervical esophagogastrostomy.   3.  Pyloroplasty.   4.  Feeding jejunostomy.   5.  Placement of left chest tube   Cancer Staging Esophageal cancer [facility]) Staging form: Esophagus - Adenocarcinoma, AJCC 8th Edition - Clinical stage from 09/13/2019: Stage IVA (cT3, cN2, cM0) - Signed by [name], MD on 09/22/2019 - Pathologic stage from 01/07/2020: Stage I (ypT0, pN0, cM0, G2) - Signed by [name], MD on 01/07/2020   History of Present Illness:     Patient returns office today in follow-up after transhiatal total esophagectomy with cervical esophagogastrostomy pyloroplasty and feeding jejunostomy tube.  He was initially staged as a T3N2 lesion of the distal esophagus, at the time of surgery was pathologic Stage I   after preoperative chemo and radiation .    On October 1 he underwent repeat upper GI endoscopy with dilatation for benign anastomotic stricture at the cervical level by Dr. [name].  Repeat esophageal dilatation was done October 15, at that time the cervical anastomosis was more open than it had been but still did not admit an adult endoscope, repeat dilatation was done.  The last dilatation was done in January of this year , since that time the patient's had no trouble with swallowing .  He has gained 5 to 6 pounds since January.  He is able to take solid food without  difficulty, did have some trouble with Metformin pill.  He still notes early satiety. He denies any dumping syndrome.  He notes that he is playing golf 2-3 times a week without difficulty  Wt Readings from Last 3 Encounters:  09/03/20 156 lb 3.2 oz (70.9 kg)  08/10/20 157 lb 8 oz (71.4 kg)  07/20/20 150 lb 9.6 oz (68.3 kg)       Zubrod Score: At the time of surgery this patient's most appropriate activity status/level should be described as: []     0    Normal activity, no symptoms [x]     1    Restricted in physical strenuous activity but ambulatory, able to do out light work []     2    Ambulatory and capable of self care, unable to do work activities, up and about                 >50 % of waking hours                                                                                   []   3    Only limited self care, in bed greater than 50% of waking hours []     4    Completely disabled, no self care, confined to bed or chair []     5    Moribund  Social History   Tobacco Use  Smoking Status Former Smoker  . Types: Cigarettes  . Quit date: 03/21/2019  . Years since quitting: 1.4  Smokeless Tobacco Never Used       No Known Allergies  Current Outpatient Medications  Medication Sig Dispense Refill  . albuterol (VENTOLIN HFA) 108 (90 Base) MCG/ACT inhaler Inhale 1 puff into the lungs every 6 (six) hours as needed for wheezing or shortness of breath.    Marland Kitchen atorvastatin (LIPITOR) 10 MG tablet Take 1 tablet (10 mg total) by mouth daily. 90 tablet 3  . esomeprazole (NEXIUM) 40 MG capsule 1 cap(s)    . metFORMIN (GLUCOPHAGE) 500 MG tablet Take 1 tablet (500 mg total) by mouth 2 (two) times daily with a meal. 180 tablet 1  . Misc Natural Products (ESSIAC TONIC) LIQD Take by mouth daily. Herbal Tea    . Multiple Vitamins-Minerals (MULTIVITAMIN WITH MINERALS) tablet Take 1 tablet by mouth daily.    . Nutritional Supplements (ENSURE CLEAR) LIQD Take 1 Can by mouth daily as needed (Extra  Calories).     No current facility-administered medications for this visit.       Physical Exam: BP 127/72   Pulse 63   Resp 20   Ht 5\' 7"  (1.702 m)   Wt 156 lb 3.2 oz (70.9 kg)   SpO2 98% Comment: RA  BMI 24.46 kg/m  General appearance: alert, cooperative and no distress Neck: no adenopathy, no carotid bruit, no JVD, supple, symmetrical, trachea midline and thyroid not enlarged, symmetric, no tenderness/mass/nodules Lymph nodes: Cervical, supraclavicular, and axillary nodes normal. Resp: clear to auscultation bilaterally Cardio: regular rate and rhythm, S1, S2 normal, no murmur, click, rub or gallop GI: soft, non-tender; bowel sounds normal; no masses,  no organomegaly Extremities: extremities normal, atraumatic, no cyanosis or edema Neurologic: Grossly normal   Abdominal and neck incisions are well-healed, there is no associated adenopathy no evidence of abdominal incisional hernia  Diagnostic Studies & Laboratory data:         Recent Radiology Findings: CT Chest W Contrast/ CT Abdomen Pelvis W Contrast  Result Date: 08/05/2020 CLINICAL DATA:  Gastrointestinal carcinoma. Routine surveillance. Esophageal cancer status post chemotherapy and radiation therapy. EXAM: CT CHEST, ABDOMEN, AND PELVIS WITH CONTRAST TECHNIQUE: Multidetector CT imaging of the chest, abdomen and pelvis was performed following the standard protocol during bolus administration of intravenous contrast. CONTRAST:  154mL OMNIPAQUE IOHEXOL 300 MG/ML  SOLN COMPARISON:  CT 12/05/2019 FINDINGS: CT CHEST FINDINGS Cardiovascular: Coronary artery calcification and aortic atherosclerotic calcification. Mediastinum/Nodes: No axillary supraclavicular adenopathy. No mediastinal adenopathy. Trachea normal. Gastric pull-up anatomy. No nodularity at the anastomosis in the thoracic inlet. No gastric obstruction. Lungs/Pleura: No suspicious pulmonary nodules. Normal pleural. Airways normal. Musculoskeletal: No aggressive osseous  lesion. CT ABDOMEN AND PELVIS FINDINGS Hepatobiliary: No focal hepatic lesion. No biliary ductal dilatation. Gallbladder is normal. Common bile duct is normal. Pancreas: Pancreas is atrophic. No duct dilatation. No inflammation. Spleen: Normal spleen Adrenals/urinary tract: Adrenal glands and kidneys are normal. The ureters and bladder normal. Stomach/Bowel: Gastric pull-up anatomy. Small-bowel cecum normal. Appendix not identified. Moderate volume stool throughout the colon. Rectum normal. Vascular/Lymphatic: Abdominal aorta is normal caliber with atherosclerotic calcification. There is no retroperitoneal or periportal lymphadenopathy. No  pelvic lymphadenopathy. Reproductive: Prostate unremarkable Other: No free fluid. Musculoskeletal: No aggressive osseous lesion. IMPRESSION: 1. No evidence of thoracic metastasis. 2. Gastric pull-up anatomy without evidence of local recurrence. 3. No evidence of metastatic disease in the abdomen pelvis. 4. Moderate volume stool throughout the colon. 5. Aortic Atherosclerosis (ICD10-I70.0). Electronically Signed   By: Suzy Bouchard M.D.   On: 08/05/2020 14:34   I have independently reviewed the above radiology studies  and reviewed the findings with the patient.   Recent Labs: Lab Results  Component Value Date   WBC 5.4 08/03/2020   HGB 13.2 08/03/2020   HCT 39.2 08/03/2020   PLT 205 08/03/2020   GLUCOSE 124 (H) 08/03/2020   CHOL 121 08/03/2020   TRIG 78 08/03/2020   HDL 42 08/03/2020   LDLCALC 63 08/03/2020   ALT 21 08/03/2020   AST 18 08/03/2020   NA 139 08/03/2020   K 4.3 08/03/2020   CL 104 08/03/2020   CREATININE 1.04 08/03/2020   BUN 11 08/03/2020   CO2 28 08/03/2020   INR 0.9 12/30/2019   HGBA1C 6.6 (A) 07/20/2020    Wt Readings from Last 3 Encounters:  09/03/20 156 lb 3.2 oz (70.9 kg)  08/10/20 157 lb 8 oz (71.4 kg)  07/20/20 150 lb 9.6 oz (68.3 kg)    Assessment / Plan:   #1 status post transhiatal total esophagectomy with cervical  esophagogastrostomy and feeding jejunostomy-pathologic stage I after preoperative radiation chemotherapy.- #2 benign esophageal stricture cervical anastomosis, has responded well to 3 dilatations -currently appears stable #3 recent CT of chest and abdomen shows no evidence of recurrence-patient has follow-up CT scan arranged by medical oncology for 6 months.  I have not made him a return appointment in the surgical office but he could be seen at any time-medical oncology is providing close medical follow-up and postoperative scans.     Medication Changes: No orders of the defined types were placed in this encounter.   [name] 09/03/2020 10:22 AM

## 2020-09-02 NOTE — Progress Notes (Signed)
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 SURGICAL PROCEDURE:   1.  Bronchoscopy.   2.  Transhiatal total esophagectomy with cervical esophagogastrostomy.   3.  Pyloroplasty.   4.  Feeding jejunostomy.   5.  Placement of left chest tube   Cancer Staging Esophageal cancer [facility]) Staging form: Esophagus - Adenocarcinoma, AJCC 8th Edition - Clinical stage from 09/13/2019: Stage IVA (cT3, cN2, cM0) - Signed by [name], MD on 09/22/2019 - Pathologic stage from 01/07/2020: Stage I (ypT0, pN0, cM0, G2) - Signed by [name], MD on 01/07/2020   History of Present Illness:     Patient returns office today in follow-up after transhiatal total esophagectomy with cervical esophagogastrostomy pyloroplasty and feeding jejunostomy tube.  He was initially staged as a T3N2 lesion of the distal esophagus, at the time of surgery was pathologic Stage I   after preoperative chemo and radiation .    On October 1 he underwent repeat upper GI endoscopy with dilatation for benign anastomotic stricture at the cervical level by Dr. [name].  Repeat esophageal dilatation was done October 15, at that time the cervical anastomosis was more open than it had been but still did not admit an adult endoscope, repeat dilatation was done.  The last dilatation was done in January of this year , since that time the patient's had no trouble with swallowing .  He has gained 5 to 6 pounds since January.  He is able to take solid food without  difficulty, did have some trouble with Metformin pill.  He still notes early satiety. He denies any dumping syndrome.  He notes that he is playing golf 2-3 times a week without difficulty  Wt Readings from Last 3 Encounters:  09/03/20 156 lb 3.2 oz (70.9 kg)  08/10/20 157 lb 8 oz (71.4 kg)  07/20/20 150 lb 9.6 oz (68.3 kg)       Zubrod Score: At the time of surgery this patient's most appropriate activity status/level should be described as: []     0    Normal activity, no symptoms [x]     1    Restricted in physical strenuous activity but ambulatory, able to do out light work []     2    Ambulatory and capable of self care, unable to do work activities, up and about                 >50 % of waking hours                                                                                   []   3    Only limited self care, in bed greater than 50% of waking hours []     4    Completely disabled, no self care, confined to bed or chair []     5    Moribund  Social History   Tobacco Use  Smoking Status Former Smoker  . Types: Cigarettes  . Quit date: 03/21/2019  . Years since quitting: 1.4  Smokeless Tobacco Never Used       No Known Allergies  Current Outpatient Medications  Medication Sig Dispense Refill  . albuterol (VENTOLIN HFA) 108 (90 Base) MCG/ACT inhaler Inhale 1 puff into the lungs every 6 (six) hours as needed for wheezing or shortness of breath.    Marland Kitchen atorvastatin (LIPITOR) 10 MG tablet Take 1 tablet (10 mg total) by mouth daily. 90 tablet 3  . esomeprazole (NEXIUM) 40 MG capsule 1 cap(s)    . metFORMIN (GLUCOPHAGE) 500 MG tablet Take 1 tablet (500 mg total) by mouth 2 (two) times daily with a meal. 180 tablet 1  . Misc Natural Products (ESSIAC TONIC) LIQD Take by mouth daily. Herbal Tea    . Multiple Vitamins-Minerals (MULTIVITAMIN WITH MINERALS) tablet Take 1 tablet by mouth daily.    . Nutritional Supplements (ENSURE CLEAR) LIQD Take 1 Can by mouth daily as needed (Extra  Calories).     No current facility-administered medications for this visit.       Physical Exam: BP 127/72   Pulse 63   Resp 20   Ht 5\' 7"  (1.702 m)   Wt 156 lb 3.2 oz (70.9 kg)   SpO2 98% Comment: RA  BMI 24.46 kg/m  General appearance: alert, cooperative and no distress Neck: no adenopathy, no carotid bruit, no JVD, supple, symmetrical, trachea midline and thyroid not enlarged, symmetric, no tenderness/mass/nodules Lymph nodes: Cervical, supraclavicular, and axillary nodes normal. Resp: clear to auscultation bilaterally Cardio: regular rate and rhythm, S1, S2 normal, no murmur, click, rub or gallop GI: soft, non-tender; bowel sounds normal; no masses,  no organomegaly Extremities: extremities normal, atraumatic, no cyanosis or edema Neurologic: Grossly normal   Abdominal and neck incisions are well-healed, there is no associated adenopathy no evidence of abdominal incisional hernia  Diagnostic Studies & Laboratory data:         Recent Radiology Findings: CT Chest W Contrast/ CT Abdomen Pelvis W Contrast  Result Date: 08/05/2020 CLINICAL DATA:  Gastrointestinal carcinoma. Routine surveillance. Esophageal cancer status post chemotherapy and radiation therapy. EXAM: CT CHEST, ABDOMEN, AND PELVIS WITH CONTRAST TECHNIQUE: Multidetector CT imaging of the chest, abdomen and pelvis was performed following the standard protocol during bolus administration of intravenous contrast. CONTRAST:  187mL OMNIPAQUE IOHEXOL 300 MG/ML  SOLN COMPARISON:  CT 12/05/2019 FINDINGS: CT CHEST FINDINGS Cardiovascular: Coronary artery calcification and aortic atherosclerotic calcification. Mediastinum/Nodes: No axillary supraclavicular adenopathy. No mediastinal adenopathy. Trachea normal. Gastric pull-up anatomy. No nodularity at the anastomosis in the thoracic inlet. No gastric obstruction. Lungs/Pleura: No suspicious pulmonary nodules. Normal pleural. Airways normal. Musculoskeletal: No aggressive osseous  lesion. CT ABDOMEN AND PELVIS FINDINGS Hepatobiliary: No focal hepatic lesion. No biliary ductal dilatation. Gallbladder is normal. Common bile duct is normal. Pancreas: Pancreas is atrophic. No duct dilatation. No inflammation. Spleen: Normal spleen Adrenals/urinary tract: Adrenal glands and kidneys are normal. The ureters and bladder normal. Stomach/Bowel: Gastric pull-up anatomy. Small-bowel cecum normal. Appendix not identified. Moderate volume stool throughout the colon. Rectum normal. Vascular/Lymphatic: Abdominal aorta is normal caliber with atherosclerotic calcification. There is no retroperitoneal or periportal lymphadenopathy. No  pelvic lymphadenopathy. Reproductive: Prostate unremarkable Other: No free fluid. Musculoskeletal: No aggressive osseous lesion. IMPRESSION: 1. No evidence of thoracic metastasis. 2. Gastric pull-up anatomy without evidence of local recurrence. 3. No evidence of metastatic disease in the abdomen pelvis. 4. Moderate volume stool throughout the colon. 5. Aortic Atherosclerosis (ICD10-I70.0). Electronically Signed   By: Suzy Bouchard M.D.   On: 08/05/2020 14:34   I have independently reviewed the above radiology studies  and reviewed the findings with the patient.   Recent Labs: Lab Results  Component Value Date   WBC 5.4 08/03/2020   HGB 13.2 08/03/2020   HCT 39.2 08/03/2020   PLT 205 08/03/2020   GLUCOSE 124 (H) 08/03/2020   CHOL 121 08/03/2020   TRIG 78 08/03/2020   HDL 42 08/03/2020   LDLCALC 63 08/03/2020   ALT 21 08/03/2020   AST 18 08/03/2020   NA 139 08/03/2020   K 4.3 08/03/2020   CL 104 08/03/2020   CREATININE 1.04 08/03/2020   BUN 11 08/03/2020   CO2 28 08/03/2020   INR 0.9 12/30/2019   HGBA1C 6.6 (A) 07/20/2020    Wt Readings from Last 3 Encounters:  09/03/20 156 lb 3.2 oz (70.9 kg)  08/10/20 157 lb 8 oz (71.4 kg)  07/20/20 150 lb 9.6 oz (68.3 kg)    Assessment / Plan:   #1 status post transhiatal total esophagectomy with cervical  esophagogastrostomy and feeding jejunostomy-pathologic stage I after preoperative radiation chemotherapy.- #2 benign esophageal stricture cervical anastomosis, has responded well to 3 dilatations -currently appears stable #3 recent CT of chest and abdomen shows no evidence of recurrence-patient has follow-up CT scan arranged by medical oncology for 6 months.  I have not made him a return appointment in the surgical office but he could be seen at any time-medical oncology is providing close medical follow-up and postoperative scans.     Medication Changes: No orders of the defined types were placed in this encounter.   [name] 09/03/2020 10:22 AM

## 2020-09-03 ENCOUNTER — Ambulatory Visit: Payer: PPO | Admitting: Cardiothoracic Surgery

## 2020-09-03 ENCOUNTER — Other Ambulatory Visit: Payer: Self-pay

## 2020-09-03 VITALS — BP 127/72 | HR 63 | Resp 20 | Ht 67.0 in | Wt 156.2 lb

## 2020-09-03 DIAGNOSIS — C155 Malignant neoplasm of lower third of esophagus: Secondary | ICD-10-CM

## 2020-09-16 ENCOUNTER — Other Ambulatory Visit: Payer: Self-pay | Admitting: Gastroenterology

## 2020-09-18 ENCOUNTER — Other Ambulatory Visit: Payer: Self-pay

## 2020-09-18 NOTE — Progress Notes (Signed)
Attempted to obtain medical history via telephone, unable to reach at this time. I left a voicemail to return pre surgical testing department's phone call.  

## 2020-09-22 ENCOUNTER — Other Ambulatory Visit (HOSPITAL_COMMUNITY)
Admission: RE | Admit: 2020-09-22 | Discharge: 2020-09-22 | Disposition: A | Discharge status: Home / Self Care | Payer: PPO | Source: Ambulatory Visit | Attending: Gastroenterology | Admitting: Gastroenterology

## 2020-09-22 DIAGNOSIS — Z01812 Encounter for preprocedural laboratory examination: Secondary | ICD-10-CM | POA: Insufficient documentation

## 2020-09-22 DIAGNOSIS — Z20822 Contact with and (suspected) exposure to covid-19: Secondary | ICD-10-CM | POA: Diagnosis not present

## 2020-09-22 LAB — SARS CORONAVIRUS 2 (TAT 6-24 HRS): SARS Coronavirus 2: NEGATIVE

## 2020-09-23 NOTE — Anesthesia Preprocedure Evaluation (Addendum)
Anesthesia Evaluation  Patient identified by MRN, date of birth, ID band Patient awake    Reviewed: Allergy & Precautions, H&P , NPO status , Patient's Chart, lab work & pertinent test results  Airway Mallampati: II   Neck ROM: full    Dental   Pulmonary asthma , former smoker,    breath sounds clear to auscultation       Cardiovascular hypertension, Pt. on medications  Rhythm:regular Rate:Normal     Neuro/Psych negative psych ROS   GI/Hepatic Neg liver ROS, GERD  ,Hx of colon polyps Esophageal CA   Endo/Other  diabetes, Type 2  Renal/GU      Musculoskeletal negative musculoskeletal ROS (+)   Abdominal   Peds  Hematology   Anesthesia Other Findings   Reproductive/Obstetrics                            Anesthesia Physical Anesthesia Plan  ASA: III  Anesthesia Plan: MAC   Post-op Pain Management:    Induction: Intravenous  PONV Risk Score and Plan: 1 and Treatment may vary due to age or medical condition and Propofol infusion  Airway Management Planned: Natural Airway  Additional Equipment: None  Intra-op Plan:   Post-operative Plan:   Informed Consent: I have reviewed the patients History and Physical, chart, labs and discussed the procedure including the risks, benefits and alternatives for the proposed anesthesia with the patient or authorized representative who has indicated his/her understanding and acceptance.     Dental advisory given  Plan Discussed with: CRNA and Anesthesiologist  Anesthesia Plan Comments: (EGD for dysphagia)       Anesthesia Quick Evaluation

## 2020-09-24 ENCOUNTER — Encounter (HOSPITAL_COMMUNITY)
Admission: RE | Disposition: A | Discharge status: Home / Self Care | Payer: Self-pay | Source: Home / Self Care | Attending: Gastroenterology

## 2020-09-24 ENCOUNTER — Ambulatory Visit (HOSPITAL_COMMUNITY): Payer: PPO | Admitting: Anesthesiology

## 2020-09-24 ENCOUNTER — Ambulatory Visit (HOSPITAL_COMMUNITY)
Admission: RE | Admit: 2020-09-24 | Discharge: 2020-09-24 | Disposition: A | Discharge status: Home / Self Care | Payer: PPO | Attending: Gastroenterology | Admitting: Gastroenterology

## 2020-09-24 ENCOUNTER — Encounter (HOSPITAL_COMMUNITY): Payer: Self-pay | Admitting: Gastroenterology

## 2020-09-24 ENCOUNTER — Other Ambulatory Visit: Payer: Self-pay

## 2020-09-24 DIAGNOSIS — R1310 Dysphagia, unspecified: Secondary | ICD-10-CM | POA: Diagnosis not present

## 2020-09-24 DIAGNOSIS — Z08 Encounter for follow-up examination after completed treatment for malignant neoplasm: Secondary | ICD-10-CM | POA: Diagnosis not present

## 2020-09-24 DIAGNOSIS — Z980 Intestinal bypass and anastomosis status: Secondary | ICD-10-CM | POA: Diagnosis not present

## 2020-09-24 DIAGNOSIS — Z9049 Acquired absence of other specified parts of digestive tract: Secondary | ICD-10-CM | POA: Diagnosis not present

## 2020-09-24 DIAGNOSIS — Z934 Other artificial openings of gastrointestinal tract status: Secondary | ICD-10-CM | POA: Insufficient documentation

## 2020-09-24 DIAGNOSIS — Z9221 Personal history of antineoplastic chemotherapy: Secondary | ICD-10-CM | POA: Diagnosis not present

## 2020-09-24 DIAGNOSIS — Z87891 Personal history of nicotine dependence: Secondary | ICD-10-CM | POA: Insufficient documentation

## 2020-09-24 DIAGNOSIS — I10 Essential (primary) hypertension: Secondary | ICD-10-CM | POA: Diagnosis not present

## 2020-09-24 DIAGNOSIS — K222 Esophageal obstruction: Secondary | ICD-10-CM | POA: Diagnosis not present

## 2020-09-24 DIAGNOSIS — C155 Malignant neoplasm of lower third of esophagus: Secondary | ICD-10-CM | POA: Diagnosis not present

## 2020-09-24 DIAGNOSIS — Z7984 Long term (current) use of oral hypoglycemic drugs: Secondary | ICD-10-CM | POA: Diagnosis not present

## 2020-09-24 DIAGNOSIS — Z79899 Other long term (current) drug therapy: Secondary | ICD-10-CM | POA: Insufficient documentation

## 2020-09-24 DIAGNOSIS — E119 Type 2 diabetes mellitus without complications: Secondary | ICD-10-CM | POA: Diagnosis not present

## 2020-09-24 DIAGNOSIS — J4520 Mild intermittent asthma, uncomplicated: Secondary | ICD-10-CM | POA: Diagnosis not present

## 2020-09-24 HISTORY — PX: ESOPHAGOGASTRODUODENOSCOPY (EGD) WITH PROPOFOL: SHX5813

## 2020-09-24 HISTORY — PX: BALLOON DILATION: SHX5330

## 2020-09-24 LAB — GLUCOSE, CAPILLARY: Glucose-Capillary: 83 mg/dL (ref 70–99)

## 2020-09-24 SURGERY — ESOPHAGOGASTRODUODENOSCOPY (EGD) WITH PROPOFOL
Anesthesia: Monitor Anesthesia Care

## 2020-09-24 MED ORDER — LACTATED RINGERS IV SOLN
INTRAVENOUS | Status: AC | PRN
  Administered 2020-09-24: 1000 mL via INTRAVENOUS

## 2020-09-24 MED ORDER — GLYCOPYRROLATE 0.2 MG/ML IJ SOLN
INTRAMUSCULAR | Status: DC | PRN
  Administered 2020-09-24: .1 mg via INTRAVENOUS

## 2020-09-24 MED ORDER — SODIUM CHLORIDE 0.9 % IV SOLN: INTRAVENOUS | Status: DC

## 2020-09-24 MED ORDER — PROPOFOL 500 MG/50ML IV EMUL
INTRAVENOUS | Status: DC | PRN
  Administered 2020-09-24: 350 ug/kg/min via INTRAVENOUS

## 2020-09-24 MED ORDER — LACTATED RINGERS IV SOLN: INTRAVENOUS | Status: DC | PRN

## 2020-09-24 MED ORDER — LIDOCAINE HCL (CARDIAC) PF 100 MG/5ML IV SOSY
PREFILLED_SYRINGE | INTRAVENOUS | Status: DC | PRN
  Administered 2020-09-24: 75 mg via INTRAVENOUS

## 2020-09-24 SURGICAL SUPPLY — 15 items

## 2020-09-24 NOTE — Discharge Instructions (Signed)

## 2020-09-24 NOTE — Interval H&P Note (Signed)
History and Physical Interval Note:  09/24/2020 1:29 PM  James Dunn  has presented today for surgery, with the diagnosis of dysphagia.  The various methods of treatment have been discussed with the patient and family. After consideration of risks, benefits and other options for treatment, the patient has consented to  Procedure(s): ESOPHAGOGASTRODUODENOSCOPY (EGD) WITH PROPOFOL (N/A) BALLOON DILATION (N/A) as a surgical intervention.  The patient's history has been reviewed, patient examined, no change in status, stable for surgery.  I have reviewed the patient's chart and labs.  Questions were answered to the patient's satisfaction.     Manasa Spease D

## 2020-09-24 NOTE — Anesthesia Postprocedure Evaluation (Signed)
Anesthesia Post Note  Patient: James Dunn  Procedure(s) Performed: ESOPHAGOGASTRODUODENOSCOPY (EGD) WITH PROPOFOL (N/A ) BALLOON DILATION (N/A )     Patient location during evaluation: Endoscopy Anesthesia Type: MAC Level of consciousness: awake and alert Pain management: pain level controlled Vital Signs Assessment: post-procedure vital signs reviewed and stable Respiratory status: spontaneous breathing, nonlabored ventilation, respiratory function stable and patient connected to nasal cannula oxygen Cardiovascular status: stable and blood pressure returned to baseline Postop Assessment: no apparent nausea or vomiting Anesthetic complications: no   No complications documented.  Last Vitals:  Vitals:   09/24/20 1410 09/24/20 1420  BP: (!) 129/59 (!) 120/95  Pulse: (!) 59 60  Resp: 17 18  Temp:    SpO2: 100% 100%    Last Pain:  Vitals:   09/24/20 1420  TempSrc:   PainSc: 0-No pain                 Adamaris King S

## 2020-09-24 NOTE — Op Note (Signed)
[facility] Patient Name: James Dunn Procedure Date: 09/24/2020 MRN: [id number] Attending MD: [name] , MD Date of Birth: Jan 14, 1951 CSN: [id number] Age: 70 Admit Type: Outpatient Procedure:                Upper GI endoscopy Indications:              Dysphagia Providers:                [name], MD, Carmie End, RN, Brooke                            Person, Tyrone Apple, Technician, Enrigue Catena,                            CRNA Referring MD:              Medicines:                 Complications:            No immediate complications. Estimated Blood Loss:     Estimated blood loss: none. Procedure:                Pre-Anesthesia Assessment:                           - Prior to the procedure, a History and Physical                            was performed, and patient medications and                            allergies were reviewed. The patient's tolerance of                            previous anesthesia was also reviewed. The risks                            and benefits of the procedure and the sedation                            options and risks were discussed with the patient.                            All questions were answered, and informed consent                            was obtained. Prior Anticoagulants: The patient has                            taken no previous anticoagulant or antiplatelet                            agents. ASA Grade Assessment: II - A patient with                            mild systemic disease. After reviewing the  risks                            and benefits, the patient was deemed in                            satisfactory condition to undergo the procedure.                           - Sedation was administered by an anesthesia                            professional. Deep sedation was attained.                           After obtaining informed consent, the endoscope was                            passed under  direct vision. Throughout the                            procedure, the patient's blood pressure, pulse, and                            oxygen saturations were monitored continuously. The                            GIF-H190 ([id number]) Olympus gastroscope was                            introduced through the mouth, and advanced to the                            second part of duodenum. The upper GI endoscopy was                            accomplished without difficulty. The patient                            tolerated the procedure well. Scope In: Scope Out: Findings:      One benign-appearing, intrinsic moderate stenosis was found 20 cm from       the incisors. This stenosis measured 1 cm (inner diameter) x less than       one cm (in length). The stenosis was traversed. A guidewire was placed       and the scope was withdrawn. Dilation was performed with a Savary       dilator with no resistance at 15 mm. The dilation site was examined and       showed complete resolution of luminal narrowing. Estimated blood loss:       none.      Evidence of a previous surgical anastomosis was found in the gastric       body. This was characterized by healthy appearing mucosa.      The examined duodenum was normal.      The anastomosis was more patent than anticipated. The  endoscope was able       to be passed through the area with mild to moderate forward pressure and       tip deflection. This is the first time that the endoscope was able to be       passed through the anastomsis without any prior dilation. Passage of the       endoscope did not create any mucosal breaks. The area was dilated with a       15 mm balloon and it was held in place for 1 minute. Impression:               - Benign-appearing esophageal stenosis. Dilated.                           - A previous surgical anastomosis was found,                            characterized by healthy appearing mucosa.                           -  Normal examined duodenum.                           - No specimens collected. Moderate Sedation:      Not Applicable - Patient had care per Anesthesia. Recommendation:           - Patient has a contact number available for                            emergencies. The signs and symptoms of potential                            delayed complications were discussed with the                            patient. Return to normal activities tomorrow.                            Written discharge instructions were provided to the                            patient.                           - Resume regular diet.                           - Continue present medications.                           - Repeat EGD with dilation PRN. Procedure Code(s):        --- Professional ---                           [phone], Esophagogastroduodenoscopy, flexible,                            transoral; with  insertion of guide wire followed by                            passage of dilator(s) through esophagus over guide                            wire Diagnosis Code(s):        --- Professional ---                           K22.2, Esophageal obstruction                           Z98.0, Intestinal bypass and anastomosis status                           R13.10, Dysphagia, unspecified CPT copyright 2019 [employer]. All rights reserved. The codes documented in this report are preliminary and upon coder review may  be revised to meet current compliance requirements. [name], MD [name], MD 09/24/2020 2:11:49 PM This report has been signed electronically. Number of Addenda: 0

## 2020-09-24 NOTE — Anesthesia Procedure Notes (Signed)
Procedure Name: MAC Date/Time: 09/24/2020 1:41 PM Performed by: Lissa Morales, CRNA Pre-anesthesia Checklist: Patient identified, Emergency Drugs available, Suction available and Patient being monitored Patient Re-evaluated:Patient Re-evaluated prior to induction Oxygen Delivery Method: Simple face mask Preoxygenation: Pre-oxygenation with 100% oxygen (POM mask) Placement Confirmation: positive ETCO2

## 2020-09-24 NOTE — Transfer of Care (Signed)
Immediate Anesthesia Transfer of Care Note  Patient: James Dunn  Procedure(s) Performed: ESOPHAGOGASTRODUODENOSCOPY (EGD) WITH PROPOFOL (N/A ) BALLOON DILATION (N/A )  Patient Location: PACU  Anesthesia Type:MAC  Level of Consciousness: awake, alert , oriented and patient cooperative  Airway & Oxygen Therapy: Patient Spontanous Breathing and Patient connected to face mask oxygen  Post-op Assessment: Report given to RN, Post -op Vital signs reviewed and stable and Patient moving all extremities X 4  Post vital signs: stable  Last Vitals:  Vitals Value Taken Time  BP    Temp    Pulse 71 09/24/20 1408  Resp 14 09/24/20 1408  SpO2 100 % 09/24/20 1408  Vitals shown include unvalidated device data.  Last Pain:  Vitals:   09/24/20 1328  TempSrc: Oral  PainSc: 0-No pain         Complications: No complications documented.

## 2020-09-28 ENCOUNTER — Encounter (HOSPITAL_COMMUNITY): Payer: Self-pay | Admitting: Gastroenterology

## 2020-10-28 NOTE — Progress Notes (Signed)
[place]   [phone]) [phone] Fax:(336) [phone]   Clinic Follow up Note   Patient Care Team: Denita Lung, MD as PCP - General (Family Medicine) [name], MD as Consulting Physician (Gastroenterology) [name], MD as Consulting Physician (Hematology) Minus Breeding, MD as Consulting Physician (Cardiology)  Date of Service:  11/02/2020  CHIEF COMPLAINT: F/u ofEsophagus cancer  SUMMARY OF ONCOLOGIC HISTORY: Oncology History Overview Note  Cancer Staging Esophageal cancer [facility]) Staging form: Esophagus - Adenocarcinoma, AJCC 8th Edition - Clinical stage from 09/13/2019: Stage IVA (cT3, cN2, cM0) - Signed by [name], MD on 09/22/2019 - Pathologic stage from 01/07/2020: Stage I (ypT0, pN0, cM0, G2) - Signed by [name], MD on 01/07/2020    Esophageal cancer ([place])  08/30/2019 Procedure   EGD by Dr. [name] 08/30/19  IMPRESSION - Partially obstructing, malignant esophageal tumor was found in the lower third of the esophagus. Biopsied. Injected. - Normal stomach. - Normal examined duodenum.   08/30/2019 Initial Biopsy   FINAL MICROSCOPIC DIAGNOSIS:   A. ESOPHAGUS, BIOPSY:  - At least intramucosal adenocarcinoma.  - See comment.   COMMENT:   - The depth of invasion can not be determined due to the superficial  nature of the biopsy.  Dr.  Vic Ripper has reviewed the case and concurs  with this interpretation.  Additional studies can be performed upon  clinician request.    09/05/2019 Imaging   CT CAP W Contrast 09/05/19  IMPRESSION: 1. Soft tissue mass noted distal esophagus, compatible with known neoplasm. There is a small 6 mm short axis adjacent paraesophageal node, concerning for metastatic disease. 2. Upper normal to mildly enlarged hepatoduodenal ligament lymph node, with upper normal right para-aortic nodes. As metastatic disease cannot be excluded, PET-CT may prove helpful to further evaluate. 3. 1.9 x 1.3 cm nodular collection of  soft tissue posterior to the descending duodenum. This is in close proximity to the pancreatic head but a definite communication of parenchyma between these 2 structures is not discernible by CT. 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 Attention on follow-up recommended. 4. 6 mm perifissural nodule right middle lobe, likely a subpleural lymph node. Attention on follow-up recommended. 5. Hepatomegaly with hepatic steatosis. 6. Ascending thoracic aorta measures 4 cm diameter. Recommend annual imaging followup by CTA or MRA. This recommendation follows 2010 ACCF/AHA/AATS/ACR/ASA/SCA/SCAI/SIR/STS/SVM Guidelines for the Diagnosis and Management of Patients with Thoracic Aortic Disease. Circulation. 2010; 121: S505-L976. Aortic aneurysm NOS (ICD10-I71.9) 7. Left-sided IVC, normal variant.   09/11/2019 Tumor Marker   Baseline CEA 25.12 on 09/11/19   09/13/2019 Cancer Staging   Staging form: Esophagus - Adenocarcinoma, AJCC 8th Edition - Clinical stage from 09/13/2019: Stage IVA (cT3, cN2, cM0) - Signed by [name], MD on 09/22/2019   09/13/2019 Procedure   EUS by Dr. [name] 09/13/19  IMPRESSION - A mass was found in the lower third of the esophagus. A tissue diagnosis was obtained prior to this exam. This is of adenocarcinoma. This was staged T3 N3 Mx by endosonographic criteria. - No specimens collected.   09/20/2019 PET scan   PET IMPRESSION: 1. Hypermetabolic distal esophageal primary. 2. Low-level hypermetabolism within upper abdominal nodes. These are technically nonspecific, but favored to be reactive in the setting of hepatic steatosis and hepatomegaly. 3. Otherwise, no evidence of metastatic disease. 4. Anal hypermetabolism could be physiologic. Consider correlation with physical exam and colonoscopy, if patient is not up-to-date. 5. Incidental findings, including: Aortic atherosclerosis (ICD10-I70.0), coronary artery atherosclerosis and  emphysema (ICD10-J43.9). Sinus disease and bilateral mastoid effusions.   09/22/2019 Initial Diagnosis   Esophageal cancer ([place])   09/30/2019 - 11/04/2019 Chemotherapy   concurrent ChemoRT with weekly Carboplatin and Taxol starting 09/30/19. Last dose was Taxol alone given neutropenia.    09/30/2019 - 11/06/2019 Radiation Therapy   concurrent ChemoRT with Dr. Lisbeth Renshaw starting 09/30/19-11/06/19   12/05/2019 Imaging   CT CAP w Contrast  IMPRESSION: 1. Interval response to therapy. Approximately 50% reduction in size of distal esophageal mass 2. No new or progressive disease identified within the chest or abdomen. 3. Emphysema and aortic atherosclerosis. 4. Left main and 3 vessel coronary artery calcifications.   Aortic Atherosclerosis (ICD10-I70.0) and Emphysema (ICD10-J43.9).     01/01/2020 Surgery   VIDEO BRONCHOSCOPY WITH BRONCHIAL WASHING FOR CULTURES AND GRAM STAIN and TRANSHIATAL TOTAL ESOPHAGECTOMY COMPLETE WITH NIMS TUBE and JEJUNOSTOMY and CHEST TUBE INSERTION with Dr Servando Snare.    01/01/2020 Pathology Results   FINAL MICROSCOPIC DIAGNOSIS:   A. ESOPHAGUS AND PARTIAL STOMACH, ESOPHAGECTOMY:  - No residual carcinoma identified.  - Reactive changes, mixed inflammation, and ulcer.  - Sixteen of sixteen lymph nodes negative for carcinoma (0/16).  - See oncology table.    01/07/2020 Cancer Staging   Staging form: Esophagus - Adenocarcinoma, AJCC 8th Edition - Pathologic stage from 01/07/2020: Stage I (ypT0, pN0, cM0, G2) - Signed by [name], MD on 01/07/2020   08/05/2020 Imaging   CT CAP  IMPRESSION: 1. No evidence of thoracic metastasis. 2. Gastric pull-up anatomy without evidence of local recurrence. 3. No evidence of metastatic disease in the abdomen pelvis. 4. Moderate volume stool throughout the colon. 5. Aortic Atherosclerosis (ICD10-I70.0).        CURRENT THERAPY:  Surveillance  INTERVAL HISTORY:  YOHAN SAMONS is here for a follow up of esophageal cancer.  He was last seen by me 3 months ago. He presents to the clinic alone. He notes he is doing well. He notes he is eating adequately and able to maintain weight. He notes he was getting harder to swallow and after esophageal dilation in April, this has much improved. He notes when he gets near full, he will start getting hiccups. He notes he remains active with golfing. He can maintain weight but finds it hard to gain weight. He notes getting to 160 pounds would be a goal if possible.     REVIEW OF SYSTEMS:   Constitutional: Denies fevers, chills or abnormal weight loss Eyes: Denies blurriness of vision Ears, nose, mouth, throat, and face: Denies mucositis or sore throat Respiratory: Denies cough, dyspnea or wheezes Cardiovascular: Denies palpitation, chest discomfort or lower extremity swelling Gastrointestinal:  Denies nausea, heartburn or change in bowel habits Skin: Denies abnormal skin rashes Lymphatics: Denies new lymphadenopathy or easy bruising Neurological:Denies numbness, tingling or new weaknesses Behavioral/Psych: Mood is stable, no new changes  All other systems were reviewed with the patient and are negative.  MEDICAL HISTORY:  Past Medical History:  Diagnosis Date  . Allergy   . Asthma   . Controlled type 2 diabetes mellitus without complication, without long-term current use of insulin ([place]) 06/25/2012  . Diverticulosis   . ED (erectile dysfunction) 12/28/2010  . Esophageal cancer ([place]) 09/22/2019  . Former smoker 06/25/2012  . GERD (gastroesophageal reflux disease) 2002  . History of colonic polyps 05/11/2016  . Hyperlipidemia associated with type 2 diabetes mellitus ([place]) 05/11/2016  . Hypertension associated with diabetes ([place]) 08/02/2017   denies  . Mild intermittent asthma without complication 8/41/3244  .  Seasonal allergic rhinitis due to pollen 11/29/2016  . Thoracic aortic aneurysm ([facility])    Ascending thoracic aorta measures 4 cm diameter  . Tubular adenoma of colon  12/05/2016    SURGICAL HISTORY: Past Surgical History:  Procedure Laterality Date  . BALLOON DILATION N/A 03/20/2020   Procedure: BALLOON DILATION;  Surgeon: [name], MD;  Location: [facility];  Service: Endoscopy;  Laterality: N/A;  . BALLOON DILATION N/A 04/03/2020   Procedure: BALLOON DILATION;  Surgeon: [name], MD;  Location: [facility];  Service: Endoscopy;  Laterality: N/A;  . BALLOON DILATION N/A 04/17/2020   Procedure: BALLOON DILATION;  Surgeon: [name], MD;  Location: [facility];  Service: Endoscopy;  Laterality: N/A;  . BALLOON DILATION N/A 05/01/2020   Procedure: BALLOON DILATION;  Surgeon: [name], MD;  Location: [facility];  Service: Endoscopy;  Laterality: N/A;  . BALLOON DILATION N/A 05/22/2020   Procedure: BALLOON DILATION;  Surgeon: [name], MD;  Location: [facility];  Service: Endoscopy;  Laterality: N/A;  . BALLOON DILATION N/A 06/05/2020   Procedure: BALLOON DILATION;  Surgeon: [name], MD;  Location: [facility];  Service: Endoscopy;  Laterality: N/A;  . BALLOON DILATION N/A 07/10/2020   Procedure: BALLOON DILATION;  Surgeon: [name], MD;  Location: [facility];  Service: Endoscopy;  Laterality: N/A;  . BALLOON DILATION N/A 09/24/2020   Procedure: BALLOON DILATION;  Surgeon: [name], MD;  Location: [facility];  Service: Endoscopy;  Laterality: N/A;  . BIOPSY  08/30/2019   Procedure: BIOPSY;  Surgeon: [name], MD;  Location: [facility];  Service: Endoscopy;;  . CHEST TUBE INSERTION Left 01/01/2020   Procedure: CHEST TUBE INSERTION;  Surgeon: [name], MD;  Location: [place];  Service: Thoracic;  Laterality: Left;  . COMPLETE ESOPHAGECTOMY N/A 01/01/2020   Procedure: TRANSHIATAL TOTAL ESOPHAGECTOMY COMPLETE WITH NIMS TUBE;  Surgeon: [name], MD;  Location: [facility] OR;  Service: Thoracic;  Laterality: N/A;  . ESOPHAGOGASTRODUODENOSCOPY (EGD) WITH PROPOFOL N/A 08/30/2019   Procedure:  ESOPHAGOGASTRODUODENOSCOPY (EGD) WITH PROPOFOL;  Surgeon: [name], MD;  Location: [facility];  Service: Endoscopy;  Laterality: N/A;  . ESOPHAGOGASTRODUODENOSCOPY (EGD) WITH PROPOFOL N/A 09/13/2019   Procedure: ESOPHAGOGASTRODUODENOSCOPY (EGD) WITH PROPOFOL;  Surgeon: [name], MD;  [facility];  Service: Endoscopy;  Laterality: N/A;  . ESOPHAGOGASTRODUODENOSCOPY (EGD) WITH PROPOFOL N/A 03/20/2020   Procedure: ESOPHAGOGASTRODUODENOSCOPY (EGD) WITH PROPOFOL;  Surgeon: [name], MD;  Location: [facility];  Service: Endoscopy;  Laterality: N/A;  . ESOPHAGOGASTRODUODENOSCOPY (EGD) WITH PROPOFOL N/A 04/03/2020   Procedure: ESOPHAGOGASTRODUODENOSCOPY (EGD) WITH PROPOFOL;  Surgeon: [name], MD;  [facility];  Service: Endoscopy;  Laterality: N/A;  . ESOPHAGOGASTRODUODENOSCOPY (EGD) WITH PROPOFOL N/A 04/17/2020   Procedure: ESOPHAGOGASTRODUODENOSCOPY (EGD) WITH PROPOFOL;  Surgeon: [name], MD;  Location: [facility];  Service: Endoscopy;  Laterality: N/A;  . ESOPHAGOGASTRODUODENOSCOPY (EGD) WITH PROPOFOL N/A 05/01/2020   Procedure: ESOPHAGOGASTRODUODENOSCOPY (EGD) WITH PROPOFOL;  Surgeon: [name], MD;  [facility];  Service: Endoscopy;  Laterality: N/A;  . ESOPHAGOGASTRODUODENOSCOPY (EGD) WITH PROPOFOL N/A 05/22/2020   Procedure: ESOPHAGOGASTRODUODENOSCOPY (EGD) WITH PROPOFOL;  Surgeon: [name], MD;  Location: [facility];  Service: Endoscopy;  Laterality: N/A;  . ESOPHAGOGASTRODUODENOSCOPY (EGD) WITH PROPOFOL N/A 06/05/2020   Procedure: ESOPHAGOGASTRODUODENOSCOPY (EGD) WITH PROPOFOL;  Surgeon: [name], MD;  Location: [facility];  Service: Endoscopy;  Laterality: N/A;  . ESOPHAGOGASTRODUODENOSCOPY (EGD) WITH PROPOFOL N/A 07/10/2020   Procedure: ESOPHAGOGASTRODUODENOSCOPY (EGD) WITH PROPOFOL;  Surgeon: [name], MD;  Location: [facility];  Service:  Endoscopy;  Laterality: N/A;  . ESOPHAGOGASTRODUODENOSCOPY (EGD) WITH  PROPOFOL N/A 09/24/2020   Procedure: ESOPHAGOGASTRODUODENOSCOPY (EGD) WITH PROPOFOL;  Surgeon: [name], MD;  Location: [facility];  Service: Endoscopy;  Laterality: N/A;  . JEJUNOSTOMY N/A 01/01/2020   Procedure: [name];  Surgeon: [name], MD;  Location: [place];  Service: Thoracic;  Laterality: N/A;  . SKIN BIOPSY N/A 03/04/2019   upper back Nerofibroma and follicular cyst   . SUBMUCOSAL INJECTION  08/30/2019   Procedure: SUBMUCOSAL INJECTION;  Surgeon: [name], MD;  Location: [facility];  Service: Endoscopy;;  . UPPER ESOPHAGEAL ENDOSCOPIC ULTRASOUND (EUS) N/A 09/13/2019   Procedure: UPPER ESOPHAGEAL ENDOSCOPIC ULTRASOUND (EUS);  Surgeon: [name], MD;  Location: [facility];  Service: Endoscopy;  Laterality: N/A;  . VIDEO BRONCHOSCOPY N/A 01/01/2020   Procedure: VIDEO BRONCHOSCOPY WITH BRONCHIAL WASHING FOR CULTURES AND GRAM STAIN;  Surgeon: [name], MD;  Location: [place];  Service: Thoracic;  Laterality: N/A;    I have reviewed the social history and family history with the patient and they are unchanged from previous note.  ALLERGIES:  has No Known Allergies.  MEDICATIONS:  Current Outpatient Medications  Medication Sig Dispense Refill  . albuterol (VENTOLIN HFA) 108 (90 Base) MCG/ACT inhaler Inhale 1 puff into the lungs every 6 (six) hours as needed for wheezing or shortness of breath.    Marland Kitchen atorvastatin (LIPITOR) 10 MG tablet Take 1 tablet (10 mg total) by mouth daily. 90 tablet 3  . esomeprazole (NEXIUM) 40 MG capsule Take 40 mg by mouth daily at 12 noon.    . metFORMIN (GLUCOPHAGE) 500 MG tablet Take 1 tablet (500 mg total) by mouth 2 (two) times daily with a meal. (Patient taking differently: Take 500 mg by mouth daily with breakfast.) 180 tablet 1  . Misc Natural Products (ESSIAC TONIC) LIQD Take 2 oz by mouth daily. Herbal Tea    . Multiple Vitamins-Minerals (MULTIVITAMIN WITH MINERALS) tablet Take 1 tablet by mouth daily.    . Nutritional  Supplements (ENSURE CLEAR) LIQD Take 1 Can by mouth daily as needed (Extra Calories). Clear     No current facility-administered medications for this visit.    PHYSICAL EXAMINATION: ECOG PERFORMANCE STATUS: 0 - Asymptomatic  Vitals:   11/02/20 0843  BP: 123/70  Pulse: (!) 59  Resp: 17  Temp: 98.1 F (36.7 C)  SpO2: 99%   Filed Weights   11/02/20 0843  Weight: 155 lb 11.2 oz (70.6 kg)    GENERAL:alert, no distress and comfortable SKIN: skin color, texture, turgor are normal, no rashes or significant lesions EYES: normal, Conjunctiva are pink and non-injected, sclera clear  NECK: supple, thyroid normal size, non-tender, without nodularity LYMPH:  no palpable lymphadenopathy in the cervical, axillary  LUNGS: clear to auscultation and percussion with normal breathing effort HEART: regular rate & rhythm and no murmurs and no lower extremity edema ABDOMEN:abdomen soft, non-tender and normal bowel sounds (+) Surgical incision healed well  Musculoskeletal:no cyanosis of digits and no clubbing  NEURO: alert & oriented x 3 with fluent speech, no focal motor/sensory deficits  LABORATORY DATA:  I have reviewed the data as listed CBC Latest Ref Rng & Units 11/02/2020 08/03/2020 03/17/2020  WBC 4.0 - 10.5 K/uL 4.4 5.4 4.6  Hemoglobin 13.0 - 17.0 g/dL 11.8(L) 13.2 12.2(L)  Hematocrit 39.0 - 52.0 % 34.4(L) 39.2 37.6(L)  Platelets 150 - 400 K/uL 153 205 179     CMP Latest Ref Rng & Units 08/03/2020 03/17/2020 02/03/2020  Glucose  70 - 99 mg/dL 124(H) 108(H) 315(H)  BUN 8 - 23 mg/dL 11 10 16   Creatinine 0.61 - 1.24 mg/dL 1.04 0.95 0.93  Sodium 135 - 145 mmol/L 139 138 134(L)  Potassium 3.5 - 5.1 mmol/L 4.3 4.4 5.0  Chloride 98 - 111 mmol/L 104 103 97(L)  CO2 22 - 32 mmol/L 28 32 26  Calcium 8.9 - 10.3 mg/dL 9.3 9.9 9.9  Total Protein 6.5 - 8.1 g/dL 7.5 7.4 6.9  Total Bilirubin 0.3 - 1.2 mg/dL 0.6 0.6 0.3  Alkaline Phos 38 - 126 U/L 78 84 101  AST 15 - 41 U/L 18 19 14(L)  ALT 0 - 44 U/L  21 23 18       RADIOGRAPHIC STUDIES: I have personally reviewed the radiological images as listed and agreed with the findings in the report. No results found.   ASSESSMENT & PLAN:  James Dunn is a 70 y.o. male with    1. Adenocarcinoma of distal esophagus, T3N2M0, stage IVA, ypT0N0 -His 3/12/21EGD showed a large, fungating, partially circumferential mass in the lower third of the esophagus causing dysphagia.His PET from 09/20/19 showedno evidence of metastatic disease. -HisEUS from 09/13/19 which showedlocally advanced adenocarcinoma invading the muscular layer with 2 enlarged LNs, T3N2M0. -He completed standardneoadjuvantconcurrent ChemoRT with weekly Carboplatin and Taxol for 6 weeks 09/30/19-11/06/19. -He proceeded with surgical resection by Dr Servando Snare on 01/01/20. Surgical path showedcomplete response to neoadjuvant chemoRT with no residual disease in surgery. -Given his complete response to chemo RT, I did not recommend adjuvant nivolumab  -He is currently on surveillance. His last CT CAP from 08/05/20 showed NED.  -He is clinically doing well. He continues esophageal dilation as needed. Swallowing overall adequate and eating well enough to maintain weight, but not gain. Labs reviewed, CBC and CMP WNL except Hg 11.8. CEA still pending. Physical exam unremarkable. There is no clinical concern for recurrence.  -He is 1 year since his diagnosis. Will continue surveillance. Next CT CAP in 01/2021.  -F/u in 3 months with lab and scan results.    2.Dysphagia -much improved -s/p multiple esophageal dilatations by Dr. [name] after surgery, will continue as needed, last in April 2022  3.H/oTobacco and alcohol use -He has long term tobacco abuse, quit in 03/2019. -He noted he has cut out alcohol in 09/2019, still drinks some time. I recommend he continue cessation.   4. HTN, HL, DM, GERD -Continue medications and f/u with PCP.    PLAN: -He is clinically doing  well. -F/u in 3 months with lab and CT CAP w contrast a few days before.    No problem-specific Assessment & Plan notes found for this encounter.   Orders Placed This Encounter  Procedures  . CT CHEST ABDOMEN PELVIS W CONTRAST    Standing Status:   Future    Standing Expiration Date:   11/02/2021    Order Specific Question:   If indicated for the ordered procedure, I authorize the administration of contrast media per Radiology protocol    Answer:   Yes    Order Specific Question:   Preferred imaging location?    Answer:   [facility]    Order Specific Question:   Release to patient    Answer:   Immediate    Order Specific Question:   Is Oral Contrast requested for this exam?    Answer:   Yes, Per Radiology protocol    Order Specific Question:   Reason for Exam (SYMPTOM  OR DIAGNOSIS REQUIRED)  Answer:   rule out cancer recurrence   All questions were answered. The patient knows to call the clinic with any problems, questions or concerns. No barriers to learning was detected. The total time spent in the appointment was 30 minutes.     [name], MD 11/02/2020   I, Joslyn Devon, am acting as scribe for [name], MD.   I have reviewed the above documentation for accuracy and completeness, and I agree with the above.

## 2020-11-02 ENCOUNTER — Inpatient Hospital Stay: Payer: PPO | Attending: Hematology

## 2020-11-02 ENCOUNTER — Inpatient Hospital Stay (HOSPITAL_BASED_OUTPATIENT_CLINIC_OR_DEPARTMENT_OTHER): Payer: PPO | Admitting: Hematology

## 2020-11-02 ENCOUNTER — Other Ambulatory Visit: Payer: Self-pay

## 2020-11-02 VITALS — BP 123/70 | HR 59 | Temp 98.1°F | Resp 17 | Wt 155.7 lb

## 2020-11-02 DIAGNOSIS — E119 Type 2 diabetes mellitus without complications: Secondary | ICD-10-CM | POA: Insufficient documentation

## 2020-11-02 DIAGNOSIS — R1310 Dysphagia, unspecified: Secondary | ICD-10-CM | POA: Insufficient documentation

## 2020-11-02 DIAGNOSIS — Z8501 Personal history of malignant neoplasm of esophagus: Secondary | ICD-10-CM | POA: Insufficient documentation

## 2020-11-02 DIAGNOSIS — I10 Essential (primary) hypertension: Secondary | ICD-10-CM | POA: Insufficient documentation

## 2020-11-02 DIAGNOSIS — K219 Gastro-esophageal reflux disease without esophagitis: Secondary | ICD-10-CM | POA: Insufficient documentation

## 2020-11-02 DIAGNOSIS — C155 Malignant neoplasm of lower third of esophagus: Secondary | ICD-10-CM

## 2020-11-02 DIAGNOSIS — R978 Other abnormal tumor markers: Secondary | ICD-10-CM | POA: Insufficient documentation

## 2020-11-02 DIAGNOSIS — E785 Hyperlipidemia, unspecified: Secondary | ICD-10-CM | POA: Diagnosis not present

## 2020-11-02 DIAGNOSIS — Z87891 Personal history of nicotine dependence: Secondary | ICD-10-CM | POA: Insufficient documentation

## 2020-11-02 DIAGNOSIS — F1021 Alcohol dependence, in remission: Secondary | ICD-10-CM | POA: Insufficient documentation

## 2020-11-02 LAB — CMP (CANCER CENTER ONLY)
ALT: 21 U/L (ref 0–44)
AST: 24 U/L (ref 15–41)
Albumin: 4 g/dL (ref 3.5–5.0)
Alkaline Phosphatase: 66 U/L (ref 38–126)
Anion gap: 8 (ref 5–15)
BUN: 9 mg/dL (ref 8–23)
CO2: 27 mmol/L (ref 22–32)
Calcium: 9.2 mg/dL (ref 8.9–10.3)
Chloride: 105 mmol/L (ref 98–111)
Creatinine: 0.97 mg/dL (ref 0.61–1.24)
GFR, Estimated: >60 mL/min (ref >60)
Glucose, Bld: 119 mg/dL — ABNORMAL HIGH (ref 70–99)
Potassium: 4.3 mmol/L (ref 3.5–5.1)
Sodium: 140 mmol/L (ref 135–145)
Total Bilirubin: 0.6 mg/dL (ref 0.3–1.2)
Total Protein: 7.1 g/dL (ref 6.5–8.1)

## 2020-11-02 LAB — CBC WITH DIFFERENTIAL (CANCER CENTER ONLY)
Abs Immature Granulocytes: 0.01 10*3/uL (ref 0.00–0.07)
Basophils Absolute: 0 10*3/uL (ref 0.0–0.1)
Basophils Relative: 1 %
Eosinophils Absolute: 0.4 10*3/uL (ref 0.0–0.5)
Eosinophils Relative: 8 %
HCT: 34.4 % — ABNORMAL LOW (ref 39.0–52.0)
Hemoglobin: 11.8 g/dL — ABNORMAL LOW (ref 13.0–17.0)
Immature Granulocytes: 0 %
Lymphocytes Relative: 21 %
Lymphs Abs: 0.9 10*3/uL (ref 0.7–4.0)
MCH: 31.6 pg (ref 26.0–34.0)
MCHC: 34.3 g/dL (ref 30.0–36.0)
MCV: 92 fL (ref 80.0–100.0)
Monocytes Absolute: 0.4 10*3/uL (ref 0.1–1.0)
Monocytes Relative: 9 %
Neutro Abs: 2.7 10*3/uL (ref 1.7–7.7)
Neutrophils Relative %: 61 %
Platelet Count: 153 10*3/uL (ref 150–400)
RBC: 3.74 MIL/uL — ABNORMAL LOW (ref 4.22–5.81)
RDW: 13.2 % (ref 11.5–15.5)
WBC Count: 4.4 10*3/uL (ref 4.0–10.5)
nRBC: 0 % (ref 0.0–0.2)

## 2020-11-02 LAB — CEA (IN HOUSE-CHCC): CEA (CHCC-In House): 7.21 ng/mL — ABNORMAL HIGH (ref 0.00–5.00)

## 2020-11-03 ENCOUNTER — Encounter: Payer: Self-pay | Admitting: Hematology

## 2020-11-06 ENCOUNTER — Ambulatory Visit: Payer: PPO | Admitting: Hematology

## 2020-11-06 ENCOUNTER — Other Ambulatory Visit: Payer: PPO

## 2020-11-09 ENCOUNTER — Telehealth: Payer: Self-pay

## 2020-11-09 NOTE — Telephone Encounter (Signed)
Patient made aware of CEA results per Dr. [name]. We will continue to monitor.   No further questions or concerns at this time.

## 2020-11-09 NOTE — Telephone Encounter (Signed)
-----   Message from [name], MD sent at 11/03/2020  7:00 PM EDT ----- Please let pt know his lab results, CEA still slightly elevated, but overall stable, likely nonspecific. Will continue monitoring.   [name]  11/03/2020

## 2021-01-20 ENCOUNTER — Ambulatory Visit (INDEPENDENT_AMBULATORY_CARE_PROVIDER_SITE_OTHER): Payer: PPO | Admitting: Family Medicine

## 2021-01-20 ENCOUNTER — Other Ambulatory Visit: Payer: Self-pay

## 2021-01-20 VITALS — BP 110/66 | HR 60 | Temp 97.1°F | Ht 67.0 in | Wt 156.4 lb

## 2021-01-20 DIAGNOSIS — I152 Hypertension secondary to endocrine disorders: Secondary | ICD-10-CM | POA: Diagnosis not present

## 2021-01-20 DIAGNOSIS — E1169 Type 2 diabetes mellitus with other specified complication: Secondary | ICD-10-CM

## 2021-01-20 DIAGNOSIS — E785 Hyperlipidemia, unspecified: Secondary | ICD-10-CM

## 2021-01-20 DIAGNOSIS — Z86010 Personal history of colonic polyps: Secondary | ICD-10-CM

## 2021-01-20 DIAGNOSIS — E1159 Type 2 diabetes mellitus with other circulatory complications: Secondary | ICD-10-CM | POA: Diagnosis not present

## 2021-01-20 DIAGNOSIS — C155 Malignant neoplasm of lower third of esophagus: Secondary | ICD-10-CM

## 2021-01-20 DIAGNOSIS — E119 Type 2 diabetes mellitus without complications: Secondary | ICD-10-CM | POA: Diagnosis not present

## 2021-01-20 DIAGNOSIS — Z87891 Personal history of nicotine dependence: Secondary | ICD-10-CM

## 2021-01-20 LAB — POCT GLYCOSYLATED HEMOGLOBIN (HGB A1C): Hemoglobin A1C: 6.3 % — AB (ref 4.0–5.6)

## 2021-01-20 LAB — POCT UA - MICROALBUMIN
Albumin/Creatinine Ratio, Urine, POC: 3.3
Creatinine, POC: 202.4 mg/dL
Microalbumin Ur, POC: 6.7 mg/L

## 2021-01-20 NOTE — Progress Notes (Signed)
Subjective:    Patient ID: James Dunn, male    DOB: 12-10-1950, 69 y.o.   MRN: [id number]  James Dunn is a 70 y.o. male who presents for follow-up of Type 2 diabetes mellitus.  Home blood sugar records: fasting range: avg. 140 Current symptoms/problems include none and have been stable. Daily foot checks:yes   Any foot concerns: none at this time. Exercise: Home exercise routine includes golf and biking. Diet: good  He continues to be followed by Dr. [name] and Dr. [name] for his esophageal cancer.  He does occasionally have reflux symptoms but continues to take Nexium on a regular basis.  He did have x-ray evidence of aortic atherosclerosis however he has been on Lipitor for quite some time.  He continues on metformin for his diabetes.  He did quit smoking a year and a half ago.  He does drink but usually 1 pint per week.  He has a history of adenomatous colonic polyps with his last colonoscopy in 2018.  He has no other concerns or complaints. The following portions of the patient's history were reviewed and updated as appropriate: allergies, current medications, past medical history, past social history and problem list.  ROS as in subjective above.     Objective:    Physical Exam Alert and in no distress diabetic foot exam is negative.  Hemoglobin A1c is 6.3.  Lab Review Diabetic Labs Latest Ref Rng & Units 11/02/2020 08/03/2020 07/20/2020 03/17/2020 03/16/2020  HbA1c 4.0 - 5.6 % - - 6.6(A) - 7.5(A)  Microalbumin mg/L - - - - -  Micro/Creat Ratio - - - - - -  Chol 100 - 199 mg/dL - 121 - 159 -  HDL >39 mg/dL - 42 - 30(L) -  Calc LDL 0 - 99 mg/dL - 63 - 103 -  Triglycerides 0 - 149 mg/dL - 78 - 147 -  Creatinine 0.61 - 1.24 mg/dL 0.97 1.04 - 0.95 -   BP/Weight 11/02/2020 09/24/2020 09/03/2020 08/10/2020 [id number]  Systolic BP [id number]  Diastolic BP 70 95 72 78 60  Wt. (Lbs) 155.7 154.98 156.2 157.5 150.6  BMI 24.39 24.27 24.46 24.67 23.59   Foot/eye exam completion dates  Latest Ref Rng & Units 04/13/2020 08/02/2017  Eye Exam No Retinopathy No Retinopathy -  Foot Form Completion - - Done    Eathin  reports that he quit smoking about 22 months ago. His smoking use included cigarettes. He has never used smokeless tobacco. He reports previous alcohol use of about 7.0 standard drinks of alcohol per week. He reports that he does not use drugs.     Assessment & Plan:    Controlled type 2 diabetes mellitus without complication, without long-term current use of insulin ([place]) - Plan: POCT glycosylated hemoglobin (Hb A1C), POCT UA - Microalbumin  Hyperlipidemia associated with type 2 diabetes mellitus ([place]) - Plan: Lipid panel  Malignant neoplasm of lower third of esophagus ([place])  Hypertension associated with diabetes ([place])  Former smoker  History of colonic polyps  Rx changes: none Education: Reviewed 'ABCs' of diabetes management (respective goals in parentheses):  A1C (<7), blood pressure (<130/80), and cholesterol (LDL <100). Compliance at present is estimated to be good. Efforts to improve compliance (if necessary) will be directed at  continue with present diet and exercise . Follow up: 6 months  He seems to be doing quite well and has become comfortable with his present lifestyle dealing with the underlying esophageal cancer.  He does check  his blood sugars periodically and I recommend he continue to do that.

## 2021-01-21 LAB — LIPID PANEL
Chol/HDL Ratio: 2.5 ratio (ref 0.0–5.0)
Cholesterol, Total: 121 mg/dL (ref 100–199)
HDL: 48 mg/dL (ref >39)
LDL Chol Calc (NIH): 57 mg/dL (ref 0–99)
Triglycerides: 80 mg/dL (ref 0–149)
VLDL Cholesterol Cal: 16 mg/dL (ref 5–40)

## 2021-01-28 ENCOUNTER — Ambulatory Visit
Admission: RE | Admit: 2021-01-28 | Discharge: 2021-01-28 | Disposition: A | Discharge status: Home / Self Care | Payer: PPO | Source: Ambulatory Visit | Attending: Hematology | Admitting: Hematology

## 2021-01-28 ENCOUNTER — Other Ambulatory Visit: Payer: PPO

## 2021-01-28 DIAGNOSIS — J439 Emphysema, unspecified: Secondary | ICD-10-CM | POA: Diagnosis not present

## 2021-01-28 DIAGNOSIS — C155 Malignant neoplasm of lower third of esophagus: Secondary | ICD-10-CM

## 2021-01-28 DIAGNOSIS — J8410 Pulmonary fibrosis, unspecified: Secondary | ICD-10-CM | POA: Diagnosis not present

## 2021-01-28 DIAGNOSIS — K820 Obstruction of gallbladder: Secondary | ICD-10-CM | POA: Diagnosis not present

## 2021-01-28 DIAGNOSIS — C159 Malignant neoplasm of esophagus, unspecified: Secondary | ICD-10-CM | POA: Diagnosis not present

## 2021-01-28 DIAGNOSIS — K5730 Diverticulosis of large intestine without perforation or abscess without bleeding: Secondary | ICD-10-CM | POA: Diagnosis not present

## 2021-01-28 DIAGNOSIS — I898 Other specified noninfective disorders of lymphatic vessels and lymph nodes: Secondary | ICD-10-CM | POA: Diagnosis not present

## 2021-01-28 DIAGNOSIS — I2510 Atherosclerotic heart disease of native coronary artery without angina pectoris: Secondary | ICD-10-CM | POA: Diagnosis not present

## 2021-01-28 MED ORDER — IOPAMIDOL (ISOVUE-300) INJECTION 61%
100.0000 mL | Freq: Once | INTRAVENOUS | Status: AC | PRN
  Administered 2021-01-28: 100 mL via INTRAVENOUS

## 2021-01-29 ENCOUNTER — Telehealth: Payer: Self-pay

## 2021-02-04 ENCOUNTER — Other Ambulatory Visit: Payer: Self-pay

## 2021-02-04 ENCOUNTER — Inpatient Hospital Stay: Payer: PPO | Attending: Hematology

## 2021-02-04 DIAGNOSIS — K219 Gastro-esophageal reflux disease without esophagitis: Secondary | ICD-10-CM | POA: Diagnosis not present

## 2021-02-04 DIAGNOSIS — E119 Type 2 diabetes mellitus without complications: Secondary | ICD-10-CM | POA: Insufficient documentation

## 2021-02-04 DIAGNOSIS — R058 Other specified cough: Secondary | ICD-10-CM | POA: Diagnosis not present

## 2021-02-04 DIAGNOSIS — R1310 Dysphagia, unspecified: Secondary | ICD-10-CM | POA: Insufficient documentation

## 2021-02-04 DIAGNOSIS — F1021 Alcohol dependence, in remission: Secondary | ICD-10-CM | POA: Insufficient documentation

## 2021-02-04 DIAGNOSIS — Z87891 Personal history of nicotine dependence: Secondary | ICD-10-CM | POA: Diagnosis not present

## 2021-02-04 DIAGNOSIS — J439 Emphysema, unspecified: Secondary | ICD-10-CM | POA: Insufficient documentation

## 2021-02-04 DIAGNOSIS — Z8501 Personal history of malignant neoplasm of esophagus: Secondary | ICD-10-CM | POA: Insufficient documentation

## 2021-02-04 DIAGNOSIS — R978 Other abnormal tumor markers: Secondary | ICD-10-CM | POA: Diagnosis not present

## 2021-02-04 DIAGNOSIS — E785 Hyperlipidemia, unspecified: Secondary | ICD-10-CM | POA: Insufficient documentation

## 2021-02-04 DIAGNOSIS — C155 Malignant neoplasm of lower third of esophagus: Secondary | ICD-10-CM

## 2021-02-04 DIAGNOSIS — I10 Essential (primary) hypertension: Secondary | ICD-10-CM | POA: Diagnosis not present

## 2021-02-04 LAB — CBC WITH DIFFERENTIAL (CANCER CENTER ONLY)
Abs Immature Granulocytes: 0.03 10*3/uL (ref 0.00–0.07)
Basophils Absolute: 0 10*3/uL (ref 0.0–0.1)
Basophils Relative: 1 %
Eosinophils Absolute: 0.2 10*3/uL (ref 0.0–0.5)
Eosinophils Relative: 4 %
HCT: 36.1 % — ABNORMAL LOW (ref 39.0–52.0)
Hemoglobin: 12.2 g/dL — ABNORMAL LOW (ref 13.0–17.0)
Immature Granulocytes: 1 %
Lymphocytes Relative: 21 %
Lymphs Abs: 1.2 10*3/uL (ref 0.7–4.0)
MCH: 31.5 pg (ref 26.0–34.0)
MCHC: 33.8 g/dL (ref 30.0–36.0)
MCV: 93.3 fL (ref 80.0–100.0)
Monocytes Absolute: 0.5 10*3/uL (ref 0.1–1.0)
Monocytes Relative: 9 %
Neutro Abs: 3.7 10*3/uL (ref 1.7–7.7)
Neutrophils Relative %: 64 %
Platelet Count: 177 10*3/uL (ref 150–400)
RBC: 3.87 MIL/uL — ABNORMAL LOW (ref 4.22–5.81)
RDW: 13.2 % (ref 11.5–15.5)
WBC Count: 5.6 10*3/uL (ref 4.0–10.5)
nRBC: 0 % (ref 0.0–0.2)

## 2021-02-04 LAB — CMP (CANCER CENTER ONLY)
ALT: 24 U/L (ref 0–44)
AST: 19 U/L (ref 15–41)
Albumin: 4.1 g/dL (ref 3.5–5.0)
Alkaline Phosphatase: 70 U/L (ref 38–126)
Anion gap: 9 (ref 5–15)
BUN: 10 mg/dL (ref 8–23)
CO2: 27 mmol/L (ref 22–32)
Calcium: 9.2 mg/dL (ref 8.9–10.3)
Chloride: 103 mmol/L (ref 98–111)
Creatinine: 1.02 mg/dL (ref 0.61–1.24)
GFR, Estimated: >60 mL/min (ref >60)
Glucose, Bld: 128 mg/dL — ABNORMAL HIGH (ref 70–99)
Potassium: 4.5 mmol/L (ref 3.5–5.1)
Sodium: 139 mmol/L (ref 135–145)
Total Bilirubin: 0.7 mg/dL (ref 0.3–1.2)
Total Protein: 7.1 g/dL (ref 6.5–8.1)

## 2021-02-04 LAB — CEA (IN HOUSE-CHCC): CEA (CHCC-In House): 8.92 ng/mL — ABNORMAL HIGH (ref 0.00–5.00)

## 2021-02-05 ENCOUNTER — Encounter: Payer: Self-pay | Admitting: Hematology

## 2021-02-05 NOTE — Telephone Encounter (Signed)
Chart Review Only

## 2021-02-08 ENCOUNTER — Encounter: Payer: Self-pay | Admitting: Hematology

## 2021-02-08 ENCOUNTER — Other Ambulatory Visit: Payer: Self-pay

## 2021-02-08 ENCOUNTER — Inpatient Hospital Stay: Payer: PPO | Admitting: Hematology

## 2021-02-08 VITALS — BP 125/76 | HR 56 | Temp 98.6°F | Resp 18 | Ht 67.0 in | Wt 158.6 lb

## 2021-02-08 DIAGNOSIS — C155 Malignant neoplasm of lower third of esophagus: Secondary | ICD-10-CM

## 2021-02-08 DIAGNOSIS — Z8501 Personal history of malignant neoplasm of esophagus: Secondary | ICD-10-CM | POA: Diagnosis not present

## 2021-02-08 NOTE — Progress Notes (Signed)
James Dunn   [phone]) [phone] [phone]   Clinic Follow up Note   Patient Care Team: Denita Lung, MD as PCP - General (Family Medicine) [name], MD as Consulting Physician (Gastroenterology) [name], MD as Consulting Physician (Hematology) Minus Breeding, MD as Consulting Physician (Cardiology)  Date of Service:  02/08/2021  CHIEF COMPLAINT: f/u of esophageal cancer  SUMMARY OF ONCOLOGIC HISTORY: Oncology History Overview Note  Cancer Staging Esophageal cancer [facility]) Staging form: Esophagus - Adenocarcinoma, AJCC 8th Edition - Clinical stage from 09/13/2019: Stage IVA (cT3, cN2, cM0) - Signed by [name], MD on 09/22/2019 - Pathologic stage from 01/07/2020: Stage I (ypT0, pN0, cM0, G2) - Signed by [name], MD on 01/07/2020    Esophageal cancer ([place])  08/30/2019 Procedure   EGD by Dr. [name] 08/30/19  IMPRESSION - Partially obstructing, malignant esophageal tumor was found in the lower third of the esophagus. Biopsied. Injected. - Normal stomach. - Normal examined duodenum.   08/30/2019 Initial Biopsy   FINAL MICROSCOPIC DIAGNOSIS:   A. ESOPHAGUS, BIOPSY:  - At least intramucosal adenocarcinoma.  - See comment.   COMMENT:   - The depth of invasion can not be determined due to the superficial  nature of the biopsy.  Dr.  Vic Ripper has reviewed the case and concurs  with this interpretation.  Additional studies can be performed upon  clinician request.    09/05/2019 Imaging   CT CAP W Contrast 09/05/19  IMPRESSION: 1. Soft tissue mass noted distal esophagus, compatible with known neoplasm. There is a small 6 mm short axis adjacent paraesophageal node, concerning for metastatic disease. 2. Upper normal to mildly enlarged hepatoduodenal ligament lymph node, with upper normal right para-aortic nodes. As metastatic disease cannot be excluded, PET-CT may prove helpful to further evaluate. 3. 1.9 x 1.3 cm nodular collection of  soft tissue posterior to the descending duodenum. This is in close proximity to the pancreatic head but a definite communication of parenchyma between these 2 structures is not discernible by CT. 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 Attention on follow-up recommended. 4. 6 mm perifissural nodule right middle lobe, likely a subpleural lymph node. Attention on follow-up recommended. 5. Hepatomegaly with hepatic steatosis. 6. Ascending thoracic aorta measures 4 cm diameter. Recommend annual imaging followup by CTA or MRA. This recommendation follows 2010 ACCF/AHA/AATS/ACR/ASA/SCA/SCAI/SIR/STS/SVM Guidelines for the Diagnosis and Management of Patients with Thoracic Aortic Disease. Circulation. 2010; [id number]. Aortic aneurysm NOS (ICD10-I71.9) 7. Left-sided IVC, normal variant.   09/11/2019 Tumor Marker   Baseline CEA 25.12 on 09/11/19   09/13/2019 Cancer Staging   Staging form: Esophagus - Adenocarcinoma, AJCC 8th Edition - Clinical stage from 09/13/2019: Stage IVA (cT3, cN2, cM0) - Signed by [name], MD on 09/22/2019   09/13/2019 Procedure   EUS by Dr. [name] 09/13/19  IMPRESSION - A mass was found in the lower third of the esophagus. A tissue diagnosis was obtained prior to this exam. This is of adenocarcinoma. This was staged T3 N3 Mx by endosonographic criteria. - No specimens collected.   09/20/2019 PET scan   PET IMPRESSION: 1. Hypermetabolic distal esophageal primary. 2. Low-level hypermetabolism within upper abdominal nodes. These are technically nonspecific, but favored to be reactive in the setting of hepatic steatosis and hepatomegaly. 3. Otherwise, no evidence of metastatic disease. 4. Anal hypermetabolism could be physiologic. Consider correlation with physical exam and colonoscopy, if patient is not up-to-date. 5. Incidental findings, including: Aortic atherosclerosis (ICD10-I70.0), coronary artery atherosclerosis  and  emphysema (ICD10-J43.9). Sinus disease and bilateral mastoid effusions.   09/22/2019 Initial Diagnosis   Esophageal cancer ([place])   09/30/2019 - 11/04/2019 Chemotherapy   concurrent ChemoRT with weekly Carboplatin and Taxol starting 09/30/19. Last dose was Taxol alone given neutropenia.    09/30/2019 - 11/06/2019 Radiation Therapy   concurrent ChemoRT with Dr. Lisbeth Renshaw starting 09/30/19-11/06/19   12/05/2019 Imaging   CT CAP w Contrast  IMPRESSION: 1. Interval response to therapy. Approximately 50% reduction in size of distal esophageal mass 2. No new or progressive disease identified within the chest or abdomen. 3. Emphysema and aortic atherosclerosis. 4. Left main and 3 vessel coronary artery calcifications.   Aortic Atherosclerosis (ICD10-I70.0) and Emphysema (ICD10-J43.9).     01/01/2020 Surgery   VIDEO BRONCHOSCOPY WITH BRONCHIAL WASHING FOR CULTURES AND GRAM STAIN and TRANSHIATAL TOTAL ESOPHAGECTOMY COMPLETE WITH NIMS TUBE and JEJUNOSTOMY and CHEST TUBE INSERTION with Dr Servando Snare.    01/01/2020 Pathology Results   FINAL MICROSCOPIC DIAGNOSIS:   A. ESOPHAGUS AND PARTIAL STOMACH, ESOPHAGECTOMY:  - No residual carcinoma identified.  - Reactive changes, mixed inflammation, and ulcer.  - Sixteen of sixteen lymph nodes negative for carcinoma (0/16).  - See oncology table.    01/07/2020 Cancer Staging   Staging form: Esophagus - Adenocarcinoma, AJCC 8th Edition - Pathologic stage from 01/07/2020: Stage I (ypT0, pN0, cM0, G2) - Signed by [name], MD on 01/07/2020   08/05/2020 Imaging   CT CAP  IMPRESSION: 1. No evidence of thoracic metastasis. 2. Gastric pull-up anatomy without evidence of local recurrence. 3. No evidence of metastatic disease in the abdomen pelvis. 4. Moderate volume stool throughout the colon. 5. Aortic Atherosclerosis (ICD10-I70.0).        CURRENT THERAPY:  Surveillance  INTERVAL HISTORY:  James Dunn is here for a follow up of esophageal cancer. He  was last seen by me on 11/02/20. He presents to the clinic accompanied by his wife. He reports a productive cough that has worsened over the last month. He notes he needs to keep a cup nearby to spit his phlegm into. He notes the phlegm is white-ish. He notes he may have some postnasal drip.   All other systems were reviewed with the patient and are negative.  MEDICAL HISTORY:  Past Medical History:  Diagnosis Date   Allergy    Asthma    Controlled type 2 diabetes mellitus without complication, without long-term current use of insulin ([place]) 06/25/2012   Diverticulosis    ED (erectile dysfunction) 12/28/2010   Esophageal cancer ([place]) 09/22/2019   Former smoker 06/25/2012   GERD (gastroesophageal reflux disease) 2002   History of colonic polyps 05/11/2016   Hyperlipidemia associated with type 2 diabetes mellitus ([place]) 05/11/2016   Hypertension associated with diabetes ([place]) 08/02/2017   denies   Mild intermittent asthma without complication [id number]   Seasonal allergic rhinitis due to pollen 11/29/2016   Thoracic aortic aneurysm ([facility])    Ascending thoracic aorta measures 4 cm diameter   Tubular adenoma of colon 12/05/2016    SURGICAL HISTORY: Past Surgical History:  Procedure Laterality Date   BALLOON DILATION N/A 03/20/2020   Procedure: BALLOON DILATION;  Surgeon: [name], MD;  Location: [facility];  Service: Endoscopy;  Laterality: N/A;   BALLOON DILATION N/A 04/03/2020   Procedure: BALLOON DILATION;  Surgeon: [name], MD;  Location: [facility];  Service: Endoscopy;  Laterality: N/A;   BALLOON DILATION N/A 04/17/2020   Procedure: BALLOON DILATION;  Surgeon: [name], MD;  Location: [facility];  Service: Endoscopy;  Laterality: N/A;   BALLOON DILATION N/A 05/01/2020   Procedure: BALLOON DILATION;  Surgeon: [name], MD;  Location: [facility];  Service: Endoscopy;  Laterality: N/A;   BALLOON DILATION N/A 05/22/2020   Procedure: BALLOON DILATION;  Surgeon: [name], MD;  [facility];  Service: Endoscopy;  Laterality: N/A;   BALLOON DILATION N/A 06/05/2020   Procedure: BALLOON DILATION;  Surgeon: [name], MD;  [facility];  Service: Endoscopy;  Laterality: N/A;   BALLOON DILATION N/A 07/10/2020   Procedure: BALLOON DILATION;  Surgeon: [name], MD;  [facility];  Service: Endoscopy;  Laterality: N/A;   BALLOON DILATION N/A 09/24/2020   Procedure: BALLOON DILATION;  Surgeon: [name], MD;  [facility];  Service: Endoscopy;  Laterality: N/A;   BIOPSY  08/30/2019   Procedure: BIOPSY;  Surgeon: [name], MD;  [facility];  Service: Endoscopy;;   CHEST TUBE INSERTION Left 01/01/2020   Procedure: CHEST TUBE INSERTION;  Surgeon: [name], MD;  Location: [facility];  Service: Thoracic;  Laterality: Left;   COMPLETE ESOPHAGECTOMY N/A 01/01/2020   Procedure: TRANSHIATAL TOTAL ESOPHAGECTOMY COMPLETE WITH NIMS TUBE;  Surgeon: [name], MD;  Location: [facility] OR;  Service: Thoracic;  Laterality: N/A;   ESOPHAGOGASTRODUODENOSCOPY (EGD) WITH PROPOFOL N/A 08/30/2019   Procedure: ESOPHAGOGASTRODUODENOSCOPY (EGD) WITH PROPOFOL;  Surgeon: [name], MD;  [facility];  Service: Endoscopy;  Laterality: N/A;   ESOPHAGOGASTRODUODENOSCOPY (EGD) WITH PROPOFOL N/A 09/13/2019   Procedure: ESOPHAGOGASTRODUODENOSCOPY (EGD) WITH PROPOFOL;  Surgeon: [name], MD;  [facility];  Service: Endoscopy;  Laterality: N/A;   ESOPHAGOGASTRODUODENOSCOPY (EGD) WITH PROPOFOL N/A 03/20/2020   Procedure: ESOPHAGOGASTRODUODENOSCOPY (EGD) WITH PROPOFOL;  Surgeon: [name], MD;  [facility];  Service: Endoscopy;  Laterality: N/A;   ESOPHAGOGASTRODUODENOSCOPY (EGD) WITH PROPOFOL N/A 04/03/2020   Procedure: ESOPHAGOGASTRODUODENOSCOPY (EGD) WITH PROPOFOL;  Surgeon: [name], MD;  [facility];  Service: Endoscopy;  Laterality: N/A;   ESOPHAGOGASTRODUODENOSCOPY  (EGD) WITH PROPOFOL N/A 04/17/2020   Procedure: ESOPHAGOGASTRODUODENOSCOPY (EGD) WITH PROPOFOL;  Surgeon: [name], MD;  [facility];  Service: Endoscopy;  Laterality: N/A;   ESOPHAGOGASTRODUODENOSCOPY (EGD) WITH PROPOFOL N/A 05/01/2020   Procedure: ESOPHAGOGASTRODUODENOSCOPY (EGD) WITH PROPOFOL;  Surgeon: [name], MD;  [facility];  Service: Endoscopy;  Laterality: N/A;   ESOPHAGOGASTRODUODENOSCOPY (EGD) WITH PROPOFOL N/A 05/22/2020   Procedure: ESOPHAGOGASTRODUODENOSCOPY (EGD) WITH PROPOFOL;  Surgeon: [name], MD;  [facility];  Service: Endoscopy;  Laterality: N/A;   ESOPHAGOGASTRODUODENOSCOPY (EGD) WITH PROPOFOL N/A 06/05/2020   Procedure: ESOPHAGOGASTRODUODENOSCOPY (EGD) WITH PROPOFOL;  Surgeon: [name], MD;  [facility];  Service: Endoscopy;  Laterality: N/A;   ESOPHAGOGASTRODUODENOSCOPY (EGD) WITH PROPOFOL N/A 07/10/2020   Procedure: ESOPHAGOGASTRODUODENOSCOPY (EGD) WITH PROPOFOL;  Surgeon: [name], MD;  [facility];  Service: Endoscopy;  Laterality: N/A;   ESOPHAGOGASTRODUODENOSCOPY (EGD) WITH PROPOFOL N/A 09/24/2020   Procedure: ESOPHAGOGASTRODUODENOSCOPY (EGD) WITH PROPOFOL;  Surgeon: [name], MD;  [facility];  Service: Endoscopy;  Laterality: N/A;   JEJUNOSTOMY N/A 01/01/2020   Procedure: [name];  Surgeon: [name], MD;  Location: [facility];  Service: Thoracic;  Laterality: N/A;   SKIN BIOPSY N/A 03/04/2019   upper back Nerofibroma and follicular cyst    SUBMUCOSAL INJECTION  08/30/2019   Procedure: SUBMUCOSAL INJECTION;  Surgeon: [name], MD;  [facility];  Service: Endoscopy;;   UPPER ESOPHAGEAL ENDOSCOPIC ULTRASOUND (EUS) N/A 09/13/2019   Procedure: UPPER ESOPHAGEAL ENDOSCOPIC ULTRASOUND (  EUS);  Surgeon: [name], MD;  Location[facility];  Service: Endoscopy;  Laterality: N/A;   VIDEO BRONCHOSCOPY N/A 01/01/2020   Procedure: VIDEO BRONCHOSCOPY WITH  BRONCHIAL WASHING FOR CULTURES AND GRAM STAIN;  Surgeon: [name], MD;  Location: [facility];  Service: Thoracic;  Laterality: N/A;    I have reviewed the social history and family history with the patient and they are unchanged from previous note.  ALLERGIES:  has No Known Allergies.  MEDICATIONS:  Current Outpatient Medications  Medication Sig Dispense Refill   albuterol (VENTOLIN HFA) 108 (90 Base) MCG/ACT inhaler Inhale 1 puff into the lungs every 6 (six) hours as needed for wheezing or shortness of breath.     atorvastatin (LIPITOR) 10 MG tablet Take 1 tablet (10 mg total) by mouth daily. 90 tablet 3   esomeprazole (NEXIUM) 40 MG capsule Take 40 mg by mouth daily at 12 noon.     metFORMIN (GLUCOPHAGE) 500 MG tablet Take 1 tablet (500 mg total) by mouth 2 (two) times daily with a meal. (Patient taking differently: Take 500 mg by mouth daily with breakfast.) 180 tablet 1   Misc Natural Products (ESSIAC TONIC) LIQD Take 2 oz by mouth daily. Herbal Tea     Multiple Vitamins-Minerals (MULTIVITAMIN WITH MINERALS) tablet Take 1 tablet by mouth daily.     Nutritional Supplements (ENSURE CLEAR) LIQD Take 1 Can by mouth daily as needed (Extra Calories). Clear (Patient not taking: Reported on 01/20/2021)     No current facility-administered medications for this visit.    PHYSICAL EXAMINATION: ECOG PERFORMANCE STATUS: 0 - Asymptomatic  Vitals:   02/08/21 0853  BP: 125/76  Pulse: (!) 56  Resp: 18  Temp: 98.6 F (37 C)  SpO2: 98%   Wt Readings from Last 3 Encounters:  02/08/21 158 lb 9.6 oz (71.9 kg)  01/20/21 156 lb 6.4 oz (70.9 kg)  11/02/20 155 lb 11.2 oz (70.6 kg)     GENERAL:alert, no distress and comfortable SKIN: skin color normal, no rashes or significant lesions EYES: normal, Conjunctiva are pink and non-injected, sclera clear  NEURO: alert & oriented x 3 with fluent speech  LABORATORY DATA:  I have reviewed the data as listed CBC Latest Ref Rng & Units 02/04/2021  11/02/2020 08/03/2020  WBC 4.0 - 10.5 K/uL 5.6 4.4 5.4  Hemoglobin 13.0 - 17.0 g/dL 12.2(L) 11.8(L) 13.2  Hematocrit 39.0 - 52.0 % 36.1(L) 34.4(L) 39.2  Platelets 150 - 400 K/uL 177 153 205     CMP Latest Ref Rng & Units 02/04/2021 11/02/2020 08/03/2020  Glucose 70 - 99 mg/dL 128(H) 119(H) 124(H)  BUN 8 - 23 mg/dL '10 9 11  '$ Creatinine 0.61 - 1.24 mg/dL 1.02 0.97 1.04  Sodium 135 - 145 mmol/L 139 140 139  Potassium 3.5 - 5.1 mmol/L 4.5 4.3 4.3  Chloride 98 - 111 mmol/L 103 105 104  CO2 22 - 32 mmol/L '27 27 28  '$ Calcium 8.9 - 10.3 mg/dL 9.2 9.2 9.3  Total Protein 6.5 - 8.1 g/dL 7.1 7.1 7.5  Total Bilirubin 0.3 - 1.2 mg/dL 0.7 0.6 0.6  Alkaline Phos 38 - 126 U/L 70 66 78  AST 15 - 41 U/L '19 24 18  '$ ALT 0 - 44 U/L '24 21 21      '$ RADIOGRAPHIC STUDIES: I have personally reviewed the radiological images as listed and agreed with the findings in the report. No results found.   ASSESSMENT & PLAN:  BARRI NORELL is a 70 y.o. male  with   1. Adenocarcinoma of distal esophagus, T3N2M0, stage IVA, ypT0N0  -His 08/30/19 EGD showed a large, fungating, partially circumferential mass in the lower third of the esophagus causing dysphagia. His PET from 09/20/19 showed no evidence of metastatic disease.  -His EUS from 09/13/19 which showed locally advanced adenocarcinoma invading the muscular layer with 2 enlarged LNs, T3N2M0. -He completed standard neoadjuvant concurrent ChemoRT with weekly Carboplatin and Taxol for 6 weeks 09/30/19-11/06/19.  -He proceeded with surgical resection by Dr Servando Snare on 01/01/20. Surgical path showed complete response to neoadjuvant chemoRT with no residual disease in surgery.  -Given his complete response to chemo RT, I did not recommend adjuvant nivolumab  -He is currently on surveillance. His last CT CAP from 01/28/21 showed NED. I personally reviewed the images with pt and discussed the incidental findings too. CEA from 02/04/21 is only mildly elevated at 8.92. -We reviewed  surveillance plan-- lab and f/u every 3 months, scan every 6 months for first three years. Then move to lab and f/u every 6 months and scan every year to complete five years. -He is clinically doing well. He continues esophageal dilation as needed, which he has not needed since April. 02/04/21 labs reviewed, CBC and CMP WNL except Hg 12.2. There is no clinical concern for recurrence.  -He is 1 year since his diagnosis. Will continue surveillance. Next CT CAP in 07/2021.  -F/u in 3 months with lab.   2. Productive Cough -worse in the last month, no fever or dyspnea  -phlegm is white-ish in color -I recommended he use a nasal spray (Flonase) to relieve any postnasal drip to see if this helps his cough. -He also has an inhaler for his emphysema, which he has not needed to use. I recommend he also try this to see if it helps. -CT chest showed emphysema but no active infection    3. Dysphagia -much improved -s/p multiple esophageal dilatations by Dr. [name] after surgery, will continue as needed, last in April 2022   4. H/o Tobacco and alcohol use -He has long term tobacco abuse, quit in 03/2019.  -He noted he has cut out alcohol in 09/2019, still drinks some time. I recommend he continue cessation.   5. HTN, HL, DM, GERD  -Continue medications and f/u with PCP.      PLAN: -lab and scan reviewed, NED -Lab and f/u in 3 months, next CT in 6 months    No problem-specific Assessment & Plan notes found for this encounter.   No orders of the defined types were placed in this encounter.  All questions were answered. The patient knows to call the clinic with any problems, questions or concerns. No barriers to learning was detected. The total time spent in the appointment was 30 minutes.     [name], MD 02/08/2021   I, Wilburn Mylar, am acting as scribe for [name], MD.   I have reviewed the above documentation for accuracy and completeness, and I agree with the above.

## 2021-03-11 ENCOUNTER — Telehealth: Payer: Self-pay | Admitting: Family Medicine

## 2021-03-11 NOTE — Chronic Care Management (AMB) (Signed)
  Chronic Care Management   Note  03/11/2021 Name: ALVA BROXSON MRN: [id number] DOB: 04/28/1951  NIRAV SWEDA is a 70 y.o. year old male who is a primary care patient of [employer], [name], MD. I reached out to [name] by phone today in response to a referral sent by Mr. Foye Spurling Zetina's PCP, Denita Lung, MD.   Mr. Hennon was given information about Chronic Care Management services today including:  CCM service includes personalized support from designated clinical staff supervised by his physician, including individualized plan of care and coordination with other care providers 24/7 contact phone numbers for assistance for urgent and routine care needs. Service will only be billed when office clinical staff spend 20 minutes or more in a month to coordinate care. Only one practitioner may furnish and bill the service in a calendar month. The patient may stop CCM services at any time (effective at the end of the month) by phone call to the office staff.   Patient wishes to consider information provided and/or speak with a member of the care team before deciding about enrollment in care management services.   Follow up plan:   [place]jana Secretary/administrator

## 2021-03-25 DIAGNOSIS — L718 Other rosacea: Secondary | ICD-10-CM | POA: Diagnosis not present

## 2021-03-25 DIAGNOSIS — L819 Disorder of pigmentation, unspecified: Secondary | ICD-10-CM | POA: Diagnosis not present

## 2021-03-25 DIAGNOSIS — D225 Melanocytic nevi of trunk: Secondary | ICD-10-CM | POA: Diagnosis not present

## 2021-03-25 DIAGNOSIS — L570 Actinic keratosis: Secondary | ICD-10-CM | POA: Diagnosis not present

## 2021-03-25 DIAGNOSIS — L812 Freckles: Secondary | ICD-10-CM | POA: Diagnosis not present

## 2021-03-25 DIAGNOSIS — L821 Other seborrheic keratosis: Secondary | ICD-10-CM | POA: Diagnosis not present

## 2021-03-25 DIAGNOSIS — L814 Other melanin hyperpigmentation: Secondary | ICD-10-CM | POA: Diagnosis not present

## 2021-04-13 ENCOUNTER — Other Ambulatory Visit: Payer: Self-pay

## 2021-04-13 DIAGNOSIS — E119 Type 2 diabetes mellitus without complications: Secondary | ICD-10-CM

## 2021-04-13 MED ORDER — METFORMIN HCL 500 MG PO TABS: 500.0000 mg | ORAL_TABLET | Freq: Two times a day (BID) | ORAL | 1 refills | Status: DC

## 2021-04-30 ENCOUNTER — Other Ambulatory Visit: Payer: Self-pay | Admitting: Gastroenterology

## 2021-05-05 DIAGNOSIS — H02834 Dermatochalasis of left upper eyelid: Secondary | ICD-10-CM | POA: Diagnosis not present

## 2021-05-05 DIAGNOSIS — H04123 Dry eye syndrome of bilateral lacrimal glands: Secondary | ICD-10-CM | POA: Diagnosis not present

## 2021-05-05 DIAGNOSIS — H02831 Dermatochalasis of right upper eyelid: Secondary | ICD-10-CM | POA: Diagnosis not present

## 2021-05-05 DIAGNOSIS — H2513 Age-related nuclear cataract, bilateral: Secondary | ICD-10-CM | POA: Diagnosis not present

## 2021-05-05 DIAGNOSIS — E119 Type 2 diabetes mellitus without complications: Secondary | ICD-10-CM | POA: Diagnosis not present

## 2021-05-05 DIAGNOSIS — H5703 Miosis: Secondary | ICD-10-CM | POA: Diagnosis not present

## 2021-05-05 LAB — HM DIABETES EYE EXAM

## 2021-05-10 ENCOUNTER — Encounter: Payer: Self-pay | Admitting: Hematology

## 2021-05-10 ENCOUNTER — Inpatient Hospital Stay: Payer: PPO | Attending: Hematology

## 2021-05-10 ENCOUNTER — Inpatient Hospital Stay: Payer: PPO | Admitting: Hematology

## 2021-05-10 ENCOUNTER — Other Ambulatory Visit: Payer: Self-pay

## 2021-05-10 ENCOUNTER — Telehealth: Payer: Self-pay | Admitting: Hematology

## 2021-05-10 VITALS — BP 148/75 | HR 61 | Temp 98.2°F | Resp 18 | Ht 67.0 in | Wt 156.2 lb

## 2021-05-10 DIAGNOSIS — K219 Gastro-esophageal reflux disease without esophagitis: Secondary | ICD-10-CM | POA: Insufficient documentation

## 2021-05-10 DIAGNOSIS — E119 Type 2 diabetes mellitus without complications: Secondary | ICD-10-CM | POA: Diagnosis not present

## 2021-05-10 DIAGNOSIS — Z8501 Personal history of malignant neoplasm of esophagus: Secondary | ICD-10-CM | POA: Insufficient documentation

## 2021-05-10 DIAGNOSIS — R1310 Dysphagia, unspecified: Secondary | ICD-10-CM | POA: Insufficient documentation

## 2021-05-10 DIAGNOSIS — C155 Malignant neoplasm of lower third of esophagus: Secondary | ICD-10-CM

## 2021-05-10 DIAGNOSIS — R978 Other abnormal tumor markers: Secondary | ICD-10-CM | POA: Diagnosis not present

## 2021-05-10 DIAGNOSIS — F1021 Alcohol dependence, in remission: Secondary | ICD-10-CM | POA: Insufficient documentation

## 2021-05-10 DIAGNOSIS — Z87891 Personal history of nicotine dependence: Secondary | ICD-10-CM | POA: Diagnosis not present

## 2021-05-10 DIAGNOSIS — I10 Essential (primary) hypertension: Secondary | ICD-10-CM | POA: Diagnosis not present

## 2021-05-10 DIAGNOSIS — J439 Emphysema, unspecified: Secondary | ICD-10-CM | POA: Diagnosis not present

## 2021-05-10 LAB — CMP (CANCER CENTER ONLY)
ALT: 33 U/L (ref 0–44)
AST: 33 U/L (ref 15–41)
Albumin: 4.5 g/dL (ref 3.5–5.0)
Alkaline Phosphatase: 59 U/L (ref 38–126)
Anion gap: 7 (ref 5–15)
BUN: 10 mg/dL (ref 8–23)
CO2: 28 mmol/L (ref 22–32)
Calcium: 9.4 mg/dL (ref 8.9–10.3)
Chloride: 106 mmol/L (ref 98–111)
Creatinine: 1.07 mg/dL (ref 0.61–1.24)
GFR, Estimated: >60 mL/min (ref >60)
Glucose, Bld: 127 mg/dL — ABNORMAL HIGH (ref 70–99)
Potassium: 4.5 mmol/L (ref 3.5–5.1)
Sodium: 141 mmol/L (ref 135–145)
Total Bilirubin: 0.5 mg/dL (ref 0.3–1.2)
Total Protein: 7.6 g/dL (ref 6.5–8.1)

## 2021-05-10 LAB — CBC WITH DIFFERENTIAL (CANCER CENTER ONLY)
Abs Immature Granulocytes: 0.01 10*3/uL (ref 0.00–0.07)
Basophils Absolute: 0 10*3/uL (ref 0.0–0.1)
Basophils Relative: 1 %
Eosinophils Absolute: 0.1 10*3/uL (ref 0.0–0.5)
Eosinophils Relative: 2 %
HCT: 38 % — ABNORMAL LOW (ref 39.0–52.0)
Hemoglobin: 12.9 g/dL — ABNORMAL LOW (ref 13.0–17.0)
Immature Granulocytes: 0 %
Lymphocytes Relative: 18 %
Lymphs Abs: 1.1 10*3/uL (ref 0.7–4.0)
MCH: 32 pg (ref 26.0–34.0)
MCHC: 33.9 g/dL (ref 30.0–36.0)
MCV: 94.3 fL (ref 80.0–100.0)
Monocytes Absolute: 0.5 10*3/uL (ref 0.1–1.0)
Monocytes Relative: 8 %
Neutro Abs: 4.3 10*3/uL (ref 1.7–7.7)
Neutrophils Relative %: 71 %
Platelet Count: 174 10*3/uL (ref 150–400)
RBC: 4.03 MIL/uL — ABNORMAL LOW (ref 4.22–5.81)
RDW: 12.8 % (ref 11.5–15.5)
WBC Count: 6 10*3/uL (ref 4.0–10.5)
nRBC: 0 % (ref 0.0–0.2)

## 2021-05-10 LAB — CEA (IN HOUSE-CHCC): CEA (CHCC-In House): 9.6 ng/mL — ABNORMAL HIGH (ref 0.00–5.00)

## 2021-05-10 NOTE — Progress Notes (Signed)
[place]   [phone] Fax:([phone]) [phone]   Clinic Follow up Note   Patient Care Team: Denita Lung, MD as PCP - General (Family Medicine) [name], MD as Consulting Physician (Gastroenterology) [name], MD as Consulting Physician (Hematology) Minus Breeding, MD as Consulting Physician (Cardiology)  Date of Service:  05/10/2021  CHIEF COMPLAINT: f/u of esophageal cancer  CURRENT THERAPY:  Surveillance  ASSESSMENT & PLAN:  James Dunn is a 70 y.o. male with   1. Adenocarcinoma of distal esophagus, T3N2M0, stage IVA, ypT0N0  -His 08/30/19 EGD showed a large, fungating, partially circumferential mass in the lower third of the esophagus causing dysphagia. His PET from 09/20/19 showed no evidence of metastatic disease.  -His EUS from 09/13/19 which showed locally advanced adenocarcinoma invading the muscular layer with 2 enlarged LNs, T3N2M0. -He completed standard neoadjuvant concurrent ChemoRT with weekly Carboplatin and Taxol for 6 weeks 09/30/19-11/06/19.  -He proceeded with surgical resection by Dr Servando Snare on 01/01/20. Surgical path showed complete response to neoadjuvant chemoRT with no residual disease in surgery. Adjuvant treatment not recommended -He is currently on surveillance. His last CT CAP from 01/28/21 showed NED. CEA from 02/04/21 is only mildly elevated at 8.92. -We reviewed surveillance plan-- lab and f/u every 3 months, scan every 6 months for first three years. Then move to lab and f/u every 6 months and scan every year to complete five years. -He is clinically doing well. He continues esophageal dilation as needed, repeat scheduled 05/28/21. Labs reviewed, CBC and CMP WNL. There is no clinical concern for recurrence.  -He is 1 year since his diagnosis. Will continue surveillance. Next CT CAP in 07/2021.  -F/u in 3 months with lab and restaging CT.    2. Dysphagia -much improved -s/p multiple esophageal dilatations by Dr. [name] after  surgery, will continue as needed, last in April 2022 -scheduled for repeat EGD and possible dilatation on 05/28/21  3. H/o Tobacco and alcohol use -He has long term tobacco abuse, quit in 03/2019.  -He noted he has cut out alcohol in 09/2019, still drinks some time. I recommend he continue cessation.   4 HTN, HL, DM, GERD  -Continue medications and f/u with PCP.      PLAN: -f/u in 3 months, with lab and CT several days before   No problem-specific Assessment & Plan notes found for this encounter.   SUMMARY OF ONCOLOGIC HISTORY: Oncology History Overview Note  Cancer Staging Esophageal cancer [facility]) Staging form: Esophagus - Adenocarcinoma, AJCC 8th Edition - Clinical stage from 09/13/2019: Stage IVA (cT3, cN2, cM0) - Signed by [name], MD on 09/22/2019 - Pathologic stage from 01/07/2020: Stage I (ypT0, pN0, cM0, G2) - Signed by [name], MD on 01/07/2020    Esophageal cancer ([place])  08/30/2019 Procedure   EGD by Dr. [name] 08/30/19  IMPRESSION - Partially obstructing, malignant esophageal tumor was found in the lower third of the esophagus. Biopsied. Injected. - Normal stomach. - Normal examined duodenum.   08/30/2019 Initial Biopsy   FINAL MICROSCOPIC DIAGNOSIS:   A. ESOPHAGUS, BIOPSY:  - At least intramucosal adenocarcinoma.  - See comment.   COMMENT:   - The depth of invasion can not be determined due to the superficial  nature of the biopsy.  Dr.  Vic Ripper has reviewed the case and concurs  with this interpretation.  Additional studies can be performed upon  clinician request.    09/05/2019 Imaging   CT CAP W Contrast 09/05/19  IMPRESSION:  1. Soft tissue mass noted distal esophagus, compatible with known neoplasm. There is a small 6 mm short axis adjacent paraesophageal node, concerning for metastatic disease. 2. Upper normal to mildly enlarged hepatoduodenal ligament lymph node, with upper normal right para-aortic nodes. As metastatic disease cannot be  excluded, PET-CT may prove helpful to further evaluate. 3. 1.9 x 1.3 cm nodular collection of soft tissue posterior to the descending duodenum. This is in close proximity to the pancreatic head but a definite communication of parenchyma between these 2 structures is not discernible by CT. 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 Attention on follow-up recommended. 4. 6 mm perifissural nodule right middle lobe, likely a subpleural lymph node. Attention on follow-up recommended. 5. Hepatomegaly with hepatic steatosis. 6. Ascending thoracic aorta measures 4 cm diameter. Recommend annual imaging followup by CTA or MRA. This recommendation follows 2010 ACCF/AHA/AATS/ACR/ASA/SCA/SCAI/SIR/STS/SVM Guidelines for the Diagnosis and Management of Patients with Thoracic Aortic Disease. Circulation. 2010; 121: E366-Q947. Aortic aneurysm NOS (ICD10-I71.9) 7. Left-sided IVC, normal variant.   09/11/2019 Tumor Marker   Baseline CEA 25.12 on 09/11/19   09/13/2019 Cancer Staging   Staging form: Esophagus - Adenocarcinoma, AJCC 8th Edition - Clinical stage from 09/13/2019: Stage IVA (cT3, cN2, cM0) - Signed by [name], MD on 09/22/2019    09/13/2019 Procedure   EUS by Dr. [name] 09/13/19  IMPRESSION - A mass was found in the lower third of the esophagus. A tissue diagnosis was obtained prior to this exam. This is of adenocarcinoma. This was staged T3 N3 Mx by endosonographic criteria. - No specimens collected.   09/20/2019 PET scan   PET IMPRESSION: 1. Hypermetabolic distal esophageal primary. 2. Low-level hypermetabolism within upper abdominal nodes. These are technically nonspecific, but favored to be reactive in the setting of hepatic steatosis and hepatomegaly. 3. Otherwise, no evidence of metastatic disease. 4. Anal hypermetabolism could be physiologic. Consider correlation with physical exam and colonoscopy, if patient is not up-to-date. 5. Incidental findings,  including: Aortic atherosclerosis (ICD10-I70.0), coronary artery atherosclerosis and emphysema (ICD10-J43.9). Sinus disease and bilateral mastoid effusions.   09/22/2019 Initial Diagnosis   Esophageal cancer ([place])   09/30/2019 - 11/04/2019 Chemotherapy   concurrent ChemoRT with weekly Carboplatin and Taxol starting 09/30/19. Last dose was Taxol alone given neutropenia.    09/30/2019 - 11/06/2019 Radiation Therapy   concurrent ChemoRT with Dr. Lisbeth Renshaw starting 09/30/19-11/06/19   12/05/2019 Imaging   CT CAP w Contrast  IMPRESSION: 1. Interval response to therapy. Approximately 50% reduction in size of distal esophageal mass 2. No new or progressive disease identified within the chest or abdomen. 3. Emphysema and aortic atherosclerosis. 4. Left main and 3 vessel coronary artery calcifications.   Aortic Atherosclerosis (ICD10-I70.0) and Emphysema (ICD10-J43.9).     01/01/2020 Surgery   VIDEO BRONCHOSCOPY WITH BRONCHIAL WASHING FOR CULTURES AND GRAM STAIN and TRANSHIATAL TOTAL ESOPHAGECTOMY COMPLETE WITH NIMS TUBE and JEJUNOSTOMY and CHEST TUBE INSERTION with Dr Servando Snare.    01/01/2020 Pathology Results   FINAL MICROSCOPIC DIAGNOSIS:   A. ESOPHAGUS AND PARTIAL STOMACH, ESOPHAGECTOMY:  - No residual carcinoma identified.  - Reactive changes, mixed inflammation, and ulcer.  - Sixteen of sixteen lymph nodes negative for carcinoma (0/16).  - See oncology table.    01/07/2020 Cancer Staging   Staging form: Esophagus - Adenocarcinoma, AJCC 8th Edition - Pathologic stage from 01/07/2020: Stage I (ypT0, pN0, cM0, G2) - Signed by [name], MD on 01/07/2020    08/05/2020 Imaging   CT CAP  IMPRESSION: 1.  No evidence of thoracic metastasis. 2. Gastric pull-up anatomy without evidence of local recurrence. 3. No evidence of metastatic disease in the abdomen pelvis. 4. Moderate volume stool throughout the colon. 5. Aortic Atherosclerosis (ICD10-I70.0).        INTERVAL HISTORY:  ENZIO BUCHLER is here for a follow up of esophageal cancer. He was last seen by me on 02/08/21. He presents to the clinic alone. He reports he is doing "really good." He notes sometimes food gets stuck, so he is scheduled for EGD with Dr. [name].   All other systems were reviewed with the patient and are negative.  MEDICAL HISTORY:  Past Medical History:  Diagnosis Date   Allergy    Asthma    Controlled type 2 diabetes mellitus without complication, without long-term current use of insulin ([place]) 06/25/2012   Diverticulosis    ED (erectile dysfunction) 12/28/2010   Esophageal cancer ([place]) 09/22/2019   Former smoker 06/25/2012   GERD (gastroesophageal reflux disease) 2002   History of colonic polyps 05/11/2016   Hyperlipidemia associated with type 2 diabetes mellitus ([place]) 05/11/2016   Hypertension associated with diabetes ([place]) 08/02/2017   denies   Mild intermittent asthma without complication 10/05/4079   Seasonal allergic rhinitis due to pollen 11/29/2016   Thoracic aortic aneurysm    Ascending thoracic aorta measures 4 cm diameter   Tubular adenoma of colon 12/05/2016    SURGICAL HISTORY: Past Surgical History:  Procedure Laterality Date   BALLOON DILATION N/A 03/20/2020   Procedure: BALLOON DILATION;  Surgeon: [name], MD;  Location: [facility];  Service: Endoscopy;  Laterality: N/A;   BALLOON DILATION N/A 04/03/2020   Procedure: BALLOON DILATION;  Surgeon: [name], MD;  Location: [facility];  Service: Endoscopy;  Laterality: N/A;   BALLOON DILATION N/A 04/17/2020   Procedure: BALLOON DILATION;  Surgeon: [name], MD;  Location: [facility];  Service: Endoscopy;  Laterality: N/A;   BALLOON DILATION N/A 05/01/2020   Procedure: BALLOON DILATION;  Surgeon: [name], MD;  [facility]: [facility];  Service: Endoscopy;  Laterality: N/A;   BALLOON DILATION N/A 05/22/2020   Procedure: BALLOON DILATION;  Surgeon: [name], MD;  [facility]: [facility];  Service: Endoscopy;   Laterality: N/A;   BALLOON DILATION N/A 06/05/2020   Procedure: BALLOON DILATION;  Surgeon: [name], MD;  [facility];  Service: Endoscopy;  Laterality: N/A;   BALLOON DILATION N/A 07/10/2020   Procedure: BALLOON DILATION;  Surgeon: [name], MD;  [facility]: [facility];  Service: Endoscopy;  Laterality: N/A;   BALLOON DILATION N/A 09/24/2020   Procedure: BALLOON DILATION;  Surgeon: [name], MD;  [facility]: [facility];  Service: Endoscopy;  Laterality: N/A;   BIOPSY  08/30/2019   Procedure: BIOPSY;  Surgeon: [name], MD;  [facility]: [facility];  Service: Endoscopy;;   CHEST TUBE INSERTION Left 01/01/2020   Procedure: CHEST TUBE INSERTION;  Surgeon: [name], MD;  Location: [place];  Service: Thoracic;  Laterality: Left;   COMPLETE ESOPHAGECTOMY N/A 01/01/2020   Procedure: TRANSHIATAL TOTAL ESOPHAGECTOMY COMPLETE WITH NIMS TUBE;  Surgeon: [name], MD;  Location: [facility];  Service: Thoracic;  Laterality: N/A;   ESOPHAGOGASTRODUODENOSCOPY (EGD) WITH PROPOFOL N/A 08/30/2019   Procedure: ESOPHAGOGASTRODUODENOSCOPY (EGD) WITH PROPOFOL;  Surgeon: [name], MD;  [facility];  Service: Endoscopy;  Laterality: N/A;   ESOPHAGOGASTRODUODENOSCOPY (EGD) WITH PROPOFOL N/A 09/13/2019   Procedure: ESOPHAGOGASTRODUODENOSCOPY (EGD) WITH PROPOFOL;  Surgeon: [name], MD;  [facility];  Service: Endoscopy;  Laterality:  N/A;   ESOPHAGOGASTRODUODENOSCOPY (EGD) WITH PROPOFOL N/A 03/20/2020   Procedure: ESOPHAGOGASTRODUODENOSCOPY (EGD) WITH PROPOFOL;  Surgeon: [name], MD;  [facility];  Service: Endoscopy;  Laterality: N/A;   ESOPHAGOGASTRODUODENOSCOPY (EGD) WITH PROPOFOL N/A 04/03/2020   Procedure: ESOPHAGOGASTRODUODENOSCOPY (EGD) WITH PROPOFOL;  Surgeon: [name], MD;  [facility];  Service: Endoscopy;  Laterality: N/A;   ESOPHAGOGASTRODUODENOSCOPY (EGD) WITH PROPOFOL N/A 04/17/2020   Procedure:  ESOPHAGOGASTRODUODENOSCOPY (EGD) WITH PROPOFOL;  Surgeon: [name], MD;  [facility];  Service: Endoscopy;  Laterality: N/A;   ESOPHAGOGASTRODUODENOSCOPY (EGD) WITH PROPOFOL N/A 05/01/2020   Procedure: ESOPHAGOGASTRODUODENOSCOPY (EGD) WITH PROPOFOL;  Surgeon: [name], MD;  [facility];  Service: Endoscopy;  Laterality: N/A;   ESOPHAGOGASTRODUODENOSCOPY (EGD) WITH PROPOFOL N/A 05/22/2020   Procedure: ESOPHAGOGASTRODUODENOSCOPY (EGD) WITH PROPOFOL;  Surgeon: [name], MD;  [facility];  Service: Endoscopy;  Laterality: N/A;   ESOPHAGOGASTRODUODENOSCOPY (EGD) WITH PROPOFOL N/A 06/05/2020   Procedure: ESOPHAGOGASTRODUODENOSCOPY (EGD) WITH PROPOFOL;  Surgeon: [name], MD;  [facility];  Service: Endoscopy;  Laterality: N/A;   ESOPHAGOGASTRODUODENOSCOPY (EGD) WITH PROPOFOL N/A 07/10/2020   Procedure: ESOPHAGOGASTRODUODENOSCOPY (EGD) WITH PROPOFOL;  Surgeon: [name], MD;  [facility];  Service: Endoscopy;  Laterality: N/A;   ESOPHAGOGASTRODUODENOSCOPY (EGD) WITH PROPOFOL N/A 09/24/2020   Procedure: ESOPHAGOGASTRODUODENOSCOPY (EGD) WITH PROPOFOL;  Surgeon: [name], MD;  [facility];  Service: Endoscopy;  Laterality: N/A;   JEJUNOSTOMY N/A 01/01/2020   Procedure: [name];  Surgeon: [name], MD;  Location: [place];  Service: Thoracic;  Laterality: N/A;   SKIN BIOPSY N/A 03/04/2019   upper back Nerofibroma and follicular cyst    SUBMUCOSAL INJECTION  08/30/2019   Procedure: SUBMUCOSAL INJECTION;  Surgeon: [name], MD;  [facility]: [facility];  Service: Endoscopy;;   UPPER ESOPHAGEAL ENDOSCOPIC ULTRASOUND (EUS) N/A 09/13/2019   Procedure: UPPER ESOPHAGEAL ENDOSCOPIC ULTRASOUND (EUS);  Surgeon: [name], MD;  Location: [facility];  Service: Endoscopy;  Laterality: N/A;   VIDEO BRONCHOSCOPY N/A 01/01/2020   Procedure: VIDEO BRONCHOSCOPY WITH BRONCHIAL WASHING FOR CULTURES AND GRAM STAIN;   Surgeon: [name], MD;  Location: [place];  Service: Thoracic;  Laterality: N/A;    I have reviewed the social history and family history with the patient and they are unchanged from previous note.  ALLERGIES:  has No Known Allergies.  MEDICATIONS:  Current Outpatient Medications  Medication Sig Dispense Refill   albuterol (VENTOLIN HFA) 108 (90 Base) MCG/ACT inhaler Inhale 1 puff into the lungs every 6 (six) hours as needed for wheezing or shortness of breath.     atorvastatin (LIPITOR) 10 MG tablet Take 1 tablet (10 mg total) by mouth daily. 90 tablet 3   esomeprazole (NEXIUM) 40 MG capsule Take 40 mg by mouth daily at 12 noon.     metFORMIN (GLUCOPHAGE) 500 MG tablet Take 1 tablet (500 mg total) by mouth 2 (two) times daily with a meal. 180 tablet 1   Misc Natural Products (ESSIAC TONIC) LIQD Take 2 oz by mouth daily. Herbal Tea     Multiple Vitamins-Minerals (MULTIVITAMIN WITH MINERALS) tablet Take 1 tablet by mouth daily.     Nutritional Supplements (ENSURE CLEAR) LIQD Take 1 Can by mouth daily as needed (Extra Calories). Clear (Patient not taking: Reported on 01/20/2021)     No current facility-administered medications for this visit.    PHYSICAL EXAMINATION: ECOG PERFORMANCE STATUS: 0 - Asymptomatic  Vitals:   05/10/21 1013  BP: (!) 148/75  Pulse: 61  Resp:  18  Temp: 98.2 F (36.8 C)  SpO2: 100%   Wt Readings from Last 3 Encounters:  05/10/21 156 lb 3.2 oz (70.9 kg)  02/08/21 158 lb 9.6 oz (71.9 kg)  01/20/21 156 lb 6.4 oz (70.9 kg)     GENERAL:alert, no distress and comfortable SKIN: skin color, texture, turgor are normal, no rashes or significant lesions EYES: normal, Conjunctiva are pink and non-injected, sclera clear  NECK: supple, thyroid normal size, non-tender, without nodularity LYMPH:  no palpable lymphadenopathy in the cervical, axillary  LUNGS: clear to auscultation and percussion with normal breathing effort HEART: regular rate & rhythm and no  murmurs and no lower extremity edema ABDOMEN:abdomen soft, non-tender and normal bowel sounds Musculoskeletal:no cyanosis of digits and no clubbing  NEURO: alert & oriented x 3 with fluent speech, no focal motor/sensory deficits  LABORATORY DATA:  I have reviewed the data as listed CBC Latest Ref Rng & Units 05/10/2021 02/04/2021 11/02/2020  WBC 4.0 - 10.5 K/uL 6.0 5.6 4.4  Hemoglobin 13.0 - 17.0 g/dL 12.9(L) 12.2(L) 11.8(L)  Hematocrit 39.0 - 52.0 % 38.0(L) 36.1(L) 34.4(L)  Platelets 150 - 400 K/uL 174 177 153     CMP Latest Ref Rng & Units 05/10/2021 02/04/2021 11/02/2020  Glucose 70 - 99 mg/dL 127(H) 128(H) 119(H)  BUN 8 - 23 mg/dL 10 10 9   Creatinine 0.61 - 1.24 mg/dL 1.07 1.02 0.97  Sodium 135 - 145 mmol/L 141 139 140  Potassium 3.5 - 5.1 mmol/L 4.5 4.5 4.3  Chloride 98 - 111 mmol/L 106 103 105  CO2 22 - 32 mmol/L 28 27 27   Calcium 8.9 - 10.3 mg/dL 9.4 9.2 9.2  Total Protein 6.5 - 8.1 g/dL 7.6 7.1 7.1  Total Bilirubin 0.3 - 1.2 mg/dL 0.5 0.7 0.6  Alkaline Phos 38 - 126 U/L 59 70 66  AST 15 - 41 U/L 33 19 24  ALT 0 - 44 U/L 33 24 21      RADIOGRAPHIC STUDIES: I have personally reviewed the radiological images as listed and agreed with the findings in the report. No results found.    Orders Placed This Encounter  Procedures   CT CHEST ABDOMEN PELVIS W CONTRAST    Standing Status:   Future    Standing Expiration Date:   05/10/2022    Order Specific Question:   Preferred imaging location?    Answer:   [facility]    Order Specific Question:   Release to patient    Answer:   Immediate    Order Specific Question:   Is Oral Contrast requested for this exam?    Answer:   Yes, Per Radiology protocol   All questions were answered. The patient knows to call the clinic with any problems, questions or concerns. No barriers to learning      [name], MD 05/10/2021   I, Wilburn Mylar, am acting as scribe for [name], MD.   I have reviewed the above  documentation for accuracy and completeness, and I agree with the above.

## 2021-05-10 NOTE — Telephone Encounter (Signed)
Scheduled appt per 11/21 los. Pt is aware.

## 2021-05-18 ENCOUNTER — Encounter (HOSPITAL_COMMUNITY): Payer: Self-pay | Admitting: Gastroenterology

## 2021-05-19 ENCOUNTER — Encounter: Payer: Self-pay | Admitting: Hematology

## 2021-05-19 ENCOUNTER — Encounter: Payer: Self-pay | Admitting: Family Medicine

## 2021-05-27 NOTE — Anesthesia Preprocedure Evaluation (Addendum)
Anesthesia Evaluation  Patient identified by MRN, date of birth, ID band Patient awake    Reviewed: Allergy & Precautions, NPO status , Patient's Chart, lab work & pertinent test results  History of Anesthesia Complications Negative for: history of anesthetic complications  Airway Mallampati: II  TM Distance: >3 FB Neck ROM: Full    Dental  (+) Missing,    Pulmonary asthma , former smoker,    Pulmonary exam normal        Cardiovascular hypertension, + CAD  Normal cardiovascular exam     Neuro/Psych negative neurological ROS  negative psych ROS   GI/Hepatic Neg liver ROS, GERD  Medicated and Controlled,  Endo/Other  diabetes, Type 2, Oral Hypoglycemic Agents  Renal/GU negative Renal ROS  negative genitourinary   Musculoskeletal negative musculoskeletal ROS (+)   Abdominal   Peds  Hematology negative hematology ROS (+)   Anesthesia Other Findings Day of surgery medications reviewed with patient.  Reproductive/Obstetrics negative OB ROS                            Anesthesia Physical Anesthesia Plan  ASA: 2  Anesthesia Plan: MAC   Post-op Pain Management: Minimal or no pain anticipated   Induction:   PONV Risk Score and Plan: 1 and Treatment may vary due to age or medical condition and Propofol infusion  Airway Management Planned: Natural Airway and Nasal Cannula  Additional Equipment: None  Intra-op Plan:   Post-operative Plan:   Informed Consent: I have reviewed the patients History and Physical, chart, labs and discussed the procedure including the risks, benefits and alternatives for the proposed anesthesia with the patient or authorized representative who has indicated his/her understanding and acceptance.       Plan Discussed with: CRNA  Anesthesia Plan Comments:        Anesthesia Quick Evaluation

## 2021-05-28 ENCOUNTER — Encounter (HOSPITAL_COMMUNITY)
Admission: RE | Disposition: A | Discharge status: Home / Self Care | Payer: Self-pay | Source: Home / Self Care | Attending: Gastroenterology

## 2021-05-28 ENCOUNTER — Ambulatory Visit (HOSPITAL_COMMUNITY): Payer: PPO | Admitting: Anesthesiology

## 2021-05-28 ENCOUNTER — Ambulatory Visit (HOSPITAL_COMMUNITY)
Admission: RE | Admit: 2021-05-28 | Discharge: 2021-05-28 | Disposition: A | Discharge status: Home / Self Care | Payer: PPO | Attending: Gastroenterology | Admitting: Gastroenterology

## 2021-05-28 ENCOUNTER — Other Ambulatory Visit: Payer: Self-pay

## 2021-05-28 DIAGNOSIS — Z79899 Other long term (current) drug therapy: Secondary | ICD-10-CM | POA: Insufficient documentation

## 2021-05-28 DIAGNOSIS — Z7984 Long term (current) use of oral hypoglycemic drugs: Secondary | ICD-10-CM | POA: Insufficient documentation

## 2021-05-28 DIAGNOSIS — R1310 Dysphagia, unspecified: Secondary | ICD-10-CM | POA: Diagnosis not present

## 2021-05-28 DIAGNOSIS — J45909 Unspecified asthma, uncomplicated: Secondary | ICD-10-CM | POA: Diagnosis not present

## 2021-05-28 DIAGNOSIS — K219 Gastro-esophageal reflux disease without esophagitis: Secondary | ICD-10-CM | POA: Insufficient documentation

## 2021-05-28 DIAGNOSIS — K222 Esophageal obstruction: Secondary | ICD-10-CM | POA: Diagnosis not present

## 2021-05-28 DIAGNOSIS — I2510 Atherosclerotic heart disease of native coronary artery without angina pectoris: Secondary | ICD-10-CM | POA: Diagnosis not present

## 2021-05-28 DIAGNOSIS — Z980 Intestinal bypass and anastomosis status: Secondary | ICD-10-CM | POA: Insufficient documentation

## 2021-05-28 DIAGNOSIS — E119 Type 2 diabetes mellitus without complications: Secondary | ICD-10-CM | POA: Diagnosis not present

## 2021-05-28 DIAGNOSIS — I10 Essential (primary) hypertension: Secondary | ICD-10-CM | POA: Insufficient documentation

## 2021-05-28 DIAGNOSIS — Z87891 Personal history of nicotine dependence: Secondary | ICD-10-CM | POA: Diagnosis not present

## 2021-05-28 DIAGNOSIS — E785 Hyperlipidemia, unspecified: Secondary | ICD-10-CM | POA: Diagnosis not present

## 2021-05-28 DIAGNOSIS — E1169 Type 2 diabetes mellitus with other specified complication: Secondary | ICD-10-CM | POA: Diagnosis not present

## 2021-05-28 HISTORY — PX: BALLOON DILATION: SHX5330

## 2021-05-28 HISTORY — PX: ESOPHAGOGASTRODUODENOSCOPY (EGD) WITH PROPOFOL: SHX5813

## 2021-05-28 LAB — GLUCOSE, CAPILLARY: Glucose-Capillary: 117 mg/dL — ABNORMAL HIGH (ref 70–99)

## 2021-05-28 SURGERY — ESOPHAGOGASTRODUODENOSCOPY (EGD) WITH PROPOFOL
Anesthesia: Monitor Anesthesia Care

## 2021-05-28 MED ORDER — PROPOFOL 10 MG/ML IV BOLUS
INTRAVENOUS | Status: DC | PRN
  Administered 2021-05-28: 70 mg via INTRAVENOUS
  Administered 2021-05-28: 30 mg via INTRAVENOUS
  Administered 2021-05-28: 20 mg via INTRAVENOUS

## 2021-05-28 MED ORDER — SODIUM CHLORIDE 0.9 % IV SOLN: INTRAVENOUS | Status: DC

## 2021-05-28 MED ORDER — PROMETHAZINE HCL 25 MG/ML IJ SOLN: 6.2500 mg | INTRAMUSCULAR | Status: DC | PRN

## 2021-05-28 MED ORDER — LACTATED RINGERS IV SOLN: INTRAVENOUS | Status: DC

## 2021-05-28 MED ORDER — PROPOFOL 500 MG/50ML IV EMUL
INTRAVENOUS | Status: DC | PRN
  Administered 2021-05-28: 100 ug/kg/min via INTRAVENOUS

## 2021-05-28 MED ORDER — GLYCOPYRROLATE 0.2 MG/ML IJ SOLN
INTRAMUSCULAR | Status: DC | PRN
Start: 2021-05-28 — End: 2021-05-28
  Administered 2021-05-28: .1 mg via INTRAVENOUS

## 2021-05-28 MED ORDER — LIDOCAINE 2% (20 MG/ML) 5 ML SYRINGE
INTRAMUSCULAR | Status: DC | PRN
Start: 2021-05-28 — End: 2021-05-28
  Administered 2021-05-28: 40 mg via INTRAVENOUS

## 2021-05-28 MED ORDER — LACTATED RINGERS IV SOLN: INTRAVENOUS | Status: DC | PRN

## 2021-05-28 SURGICAL SUPPLY — 15 items

## 2021-05-28 NOTE — H&P (Signed)
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  There is some report of intermittent dysphagia when he eats too quickly or if he does not cut up of meats to smaller portions.  Past Medical History:  Diagnosis Date   Allergy    Asthma    Controlled type 2 diabetes mellitus without complication, without long-term current use of insulin ([place]) 06/25/2012   Diverticulosis    ED (erectile dysfunction) 12/28/2010   Esophageal cancer ([place]) 09/22/2019   Former smoker 06/25/2012   GERD (gastroesophageal reflux disease) 2002   History of colonic polyps 05/11/2016   Hyperlipidemia associated with type 2 diabetes mellitus ([place]) 05/11/2016   Hypertension associated with diabetes ([place]) 08/02/2017   denies   Mild intermittent asthma without complication 3/71/0626   Seasonal allergic rhinitis due to pollen 11/29/2016   Thoracic aortic aneurysm    Ascending thoracic aorta measures 4 cm diameter   Tubular adenoma of colon 12/05/2016    Past Surgical History:  Procedure Laterality Date   BALLOON DILATION N/A 03/20/2020   Procedure: BALLOON DILATION;  Surgeon: [name], MD;  Location: [facility];  Service: Endoscopy;  Laterality: N/A;   BALLOON DILATION N/A 04/03/2020   Procedure: BALLOON DILATION;  Surgeon: [name], MD;  Location: [facility];  Service: Endoscopy;  Laterality: N/A;   BALLOON DILATION N/A 04/17/2020   Procedure: BALLOON DILATION;  Surgeon: [name], MD;  Location: [facility];  Service: Endoscopy;  Laterality: N/A;   BALLOON DILATION N/A 05/01/2020   Procedure: BALLOON DILATION;  Surgeon: [name], MD;  Location: [facility];  Service: Endoscopy;  Laterality: N/A;   BALLOON DILATION N/A 05/22/2020   Procedure: BALLOON DILATION;  Surgeon: [name], MD;  Location: [facility];  Service: Endoscopy;  Laterality: N/A;   BALLOON DILATION N/A 06/05/2020   Procedure: BALLOON DILATION;  Surgeon: [name], MD;  [facility]: [facility];  Service: Endoscopy;  Laterality: N/A;   BALLOON DILATION N/A 07/10/2020   Procedure: BALLOON DILATION;  Surgeon: [name], MD;  Location: [facility];  Service: Endoscopy;  Laterality: N/A;   BALLOON DILATION N/A 09/24/2020   Procedure: BALLOON DILATION;  Surgeon: [name], MD;  Location: [facility];  Service: Endoscopy;  Laterality: N/A;   BIOPSY  08/30/2019   Procedure: BIOPSY;  Surgeon: [name], MD;  Location: [facility];  Service: Endoscopy;;   CHEST TUBE INSERTION Left 01/01/2020   Procedure: CHEST TUBE INSERTION;  Surgeon: [name], MD;  Location: [facility];  Service: Thoracic;  Laterality: Left;   COMPLETE ESOPHAGECTOMY N/A 01/01/2020   Procedure: TRANSHIATAL TOTAL ESOPHAGECTOMY COMPLETE WITH NIMS TUBE;  Surgeon: [name], MD;  Location[facility];  Service: Thoracic;  Laterality: N/A;   ESOPHAGOGASTRODUODENOSCOPY (EGD) WITH PROPOFOL N/A 08/30/2019   Procedure: ESOPHAGOGASTRODUODENOSCOPY (EGD) WITH PROPOFOL;  Surgeon: [name], MD;  [facility];  Service: Endoscopy;  Laterality: N/A;   ESOPHAGOGASTRODUODENOSCOPY (EGD) WITH PROPOFOL N/A 09/13/2019   Procedure: ESOPHAGOGASTRODUODENOSCOPY (EGD) WITH PROPOFOL;  Surgeon: [name], MD;  [facility];  Service: Endoscopy;  Laterality: N/A;   ESOPHAGOGASTRODUODENOSCOPY (EGD) WITH PROPOFOL N/A 03/20/2020   Procedure: ESOPHAGOGASTRODUODENOSCOPY (EGD) WITH PROPOFOL;  Surgeon: [name], MD;  [facility];  Service: Endoscopy;  Laterality: N/A;   ESOPHAGOGASTRODUODENOSCOPY (EGD) WITH PROPOFOL N/A 04/03/2020   Procedure: ESOPHAGOGASTRODUODENOSCOPY (EGD) WITH PROPOFOL;  Surgeon: [name], MD;  [facility];  Service: Endoscopy;  Laterality: N/A;   ESOPHAGOGASTRODUODENOSCOPY (EGD) WITH PROPOFOL N/A 04/17/2020   Procedure: ESOPHAGOGASTRODUODENOSCOPY (  EGD) WITH PROPOFOL;  Surgeon: [name], MD;  [facility];  Service: Endoscopy;  Laterality: N/A;    ESOPHAGOGASTRODUODENOSCOPY (EGD) WITH PROPOFOL N/A 05/01/2020   Procedure: ESOPHAGOGASTRODUODENOSCOPY (EGD) WITH PROPOFOL;  Surgeon: [name], MD;  [facility];  Service: Endoscopy;  Laterality: N/A;   ESOPHAGOGASTRODUODENOSCOPY (EGD) WITH PROPOFOL N/A 05/22/2020   Procedure: ESOPHAGOGASTRODUODENOSCOPY (EGD) WITH PROPOFOL;  Surgeon: [name], MD;  [facility];  Service: Endoscopy;  Laterality: N/A;   ESOPHAGOGASTRODUODENOSCOPY (EGD) WITH PROPOFOL N/A 06/05/2020   Procedure: ESOPHAGOGASTRODUODENOSCOPY (EGD) WITH PROPOFOL;  Surgeon: [name], MD;  [facility];  Service: Endoscopy;  Laterality: N/A;   ESOPHAGOGASTRODUODENOSCOPY (EGD) WITH PROPOFOL N/A 07/10/2020   Procedure: ESOPHAGOGASTRODUODENOSCOPY (EGD) WITH PROPOFOL;  Surgeon: [name], MD;  [facility];  Service: Endoscopy;  Laterality: N/A;   ESOPHAGOGASTRODUODENOSCOPY (EGD) WITH PROPOFOL N/A 09/24/2020   Procedure: ESOPHAGOGASTRODUODENOSCOPY (EGD) WITH PROPOFOL;  Surgeon: [name], MD;  Location: [facility];  Service: Endoscopy;  Laterality: N/A;   JEJUNOSTOMY N/A 01/01/2020   Procedure: [name];  Surgeon: [name], MD;  Location: [facility];  Service: Thoracic;  Laterality: N/A;   SKIN BIOPSY N/A 03/04/2019   upper back Nerofibroma and follicular cyst    SUBMUCOSAL INJECTION  08/30/2019   Procedure: SUBMUCOSAL INJECTION;  Surgeon: [name], MD;  Location: [facility];  Service: Endoscopy;;   UPPER ESOPHAGEAL ENDOSCOPIC ULTRASOUND (EUS) N/A 09/13/2019   Procedure: UPPER ESOPHAGEAL ENDOSCOPIC ULTRASOUND (EUS);  Surgeon: [name], MD;  Location: [facility];  Service: Endoscopy;  Laterality: N/A;   VIDEO BRONCHOSCOPY N/A 01/01/2020   Procedure: VIDEO BRONCHOSCOPY WITH BRONCHIAL WASHING FOR CULTURES AND GRAM STAIN;  Surgeon: [name], MD;  Location: [facility];  Service: Thoracic;  Laterality: N/A;    Family History  Problem Relation Age of Onset    Emphysema Mother    AAA (abdominal aortic aneurysm) Father     Social History:  reports that he quit smoking about 2 years ago. His smoking use included cigarettes. He has never used smokeless tobacco. He reports that he does not currently use alcohol after a past usage of about 7.0 standard drinks per week. He reports that he does not use drugs.  Allergies: No Known Allergies  Medications: Scheduled: Continuous:  sodium chloride     lactated ringers      No results found for this or any previous visit (from the past 24 hour(s)).   No results found.  ROS:  As stated above in the HPI otherwise negative.  Blood pressure (!) 154/70, pulse (!) 57, temperature 97.9 F (36.6 C), temperature source Oral, resp. rate 16, height 5\' 7"  (1.702 m), weight 71.2 kg, SpO2 100 %.    PE: Gen: NAD, Alert and Oriented HEENT:  [place]/AT, EOMI Neck: Supple, no LAD Lungs: CTA Bilaterally CV: RRR without M/G/R ABD: Soft, NTND, +BS Ext: No C/C/E  Assessment/Plan: 1) Anastomotic stricture - EGD with balloon dilation.  Paublo Warshawsky D 05/28/2021, 7:11 AM

## 2021-05-28 NOTE — Anesthesia Postprocedure Evaluation (Signed)
Anesthesia Post Note  Patient: James Dunn  Procedure(s) Performed: ESOPHAGOGASTRODUODENOSCOPY (EGD) WITH PROPOFOL BALLOON DILATION     Patient location during evaluation: PACU Anesthesia Type: MAC Level of consciousness: awake and alert and oriented Pain management: pain level controlled Vital Signs Assessment: post-procedure vital signs reviewed and stable Respiratory status: spontaneous breathing, nonlabored ventilation and respiratory function stable Cardiovascular status: blood pressure returned to baseline Postop Assessment: no apparent nausea or vomiting Anesthetic complications: no   No notable events documented.  Last Vitals:  Vitals:   05/28/21 0801 05/28/21 0811  BP: 121/70 126/70  Pulse: (!) 58 (!) 52  Resp: 14 16  Temp:    SpO2: 97% 92%    Last Pain:  Vitals:   05/28/21 0748  TempSrc: Oral  PainSc:                  Marthenia Rolling

## 2021-05-28 NOTE — Transfer of Care (Signed)
Immediate Anesthesia Transfer of Care Note  Patient: James Dunn  Procedure(s) Performed: ESOPHAGOGASTRODUODENOSCOPY (EGD) WITH PROPOFOL BALLOON DILATION  Patient Location: PACU  Anesthesia Type:MAC  Level of Consciousness: awake, alert , oriented and patient cooperative  Airway & Oxygen Therapy: Patient Spontanous Breathing and Patient connected to face mask oxygen  Post-op Assessment: Report given to RN and Post -op Vital signs reviewed and stable  Post vital signs: Reviewed and stable  Last Vitals:  Vitals Value Taken Time  BP    Temp    Pulse    Resp    SpO2      Last Pain:  Vitals:   05/28/21 0655  TempSrc: Oral  PainSc: 0-No pain         Complications: No notable events documented.

## 2021-05-28 NOTE — Op Note (Signed)
[facility] Patient Name: James Dunn Procedure Date: 05/28/2021 MRN: [id number] Attending MD: [name] , MD Date of Birth: [birth year] CSN: [id number] Age: 70 Admit Type: Outpatient Procedure:                Upper GI endoscopy Indications:              Dysphagia Providers:                [name], MD, [name], RN, Frazier Richards,                            Technician Referring MD:              Medicines:                Propofol per Anesthesia Complications:            No immediate complications. Estimated Blood Loss:     Estimated blood loss: none. Procedure:                Pre-Anesthesia Assessment:                           - Prior to the procedure, a History and Physical                            was performed, and patient medications and                            allergies were reviewed. The patient's tolerance of                            previous anesthesia was also reviewed. The risks                            and benefits of the procedure and the sedation                            options and risks were discussed with the patient.                            All questions were answered, and informed consent                            was obtained. Prior Anticoagulants: The patient has                            taken no previous anticoagulant or antiplatelet                            agents. ASA Grade Assessment: II - A patient with                            mild systemic disease. After reviewing the risks                            and benefits,  the patient was deemed in                            satisfactory condition to undergo the procedure.                           - Sedation was administered by an anesthesia                            professional. Deep sedation was attained.                           After obtaining informed consent, the endoscope was                            passed under direct vision. Throughout the                             procedure, the patient's blood pressure, pulse, and                            oxygen saturations were monitored continuously. The                            [id number]-H190 ([id number] Olympus endoscope was introduced                            through the mouth, and advanced to the second part                            of duodenum. The upper GI endoscopy was                            accomplished without difficulty. The patient                            tolerated the procedure well. Scope In: Scope Out: Findings:      One benign-appearing, intrinsic mild stenosis was found 21 cm from the       incisors. This stenosis measured 1.4 cm (inner diameter) x less than one       cm (in length). The stenosis was traversed. A TTS dilator was passed       through the scope. Dilation with a 12-13.5-15 mm balloon dilator was       performed to 15 mm. The dilation site was examined and showed mild       mucosal disruption. Estimated blood loss was minimal.      Evidence of a previous surgical anastomosis was found in the gastric       body. This was characterized by healthy appearing mucosa.      The examined duodenum was normal.      With initial inspection the esophago-gastric anastomosis was patent.       There was evidence of mild strictuing at 21 mm. Excellent mucosal       healing was noted at 22 cm. Balloon dilation was performed with the 15  mm balloon and a small mucosal break was achieved. Impression:               - Benign-appearing esophageal stenosis. Dilated.                           - A previous surgical anastomosis was found,                            characterized by healthy appearing mucosa.                           - Normal examined duodenum.                           - No specimens collected. Moderate Sedation:      Not Applicable - Patient had care per Anesthesia. Recommendation:           - Patient has a contact number available for                             emergencies. The signs and symptoms of potential                            delayed complications were discussed with the                            patient. Return to normal activities tomorrow.                            Written discharge instructions were provided to the                            patient.                           - Resume previous diet.                           - Continue present medications.                           - EGD with dilation PRN. Procedure Code(s):        --- Professional ---                           [phone], Esophagogastroduodenoscopy, flexible,                            transoral; with transendoscopic balloon dilation of                            esophagus (less than 30 mm diameter) Diagnosis Code(s):        --- Professional ---                           K22.2, Esophageal obstruction  Z98.0, Intestinal bypass and anastomosis status                           R13.10, Dysphagia, unspecified CPT copyright 2019 [employer]. All rights reserved. The codes documented in this report are preliminary and upon coder review may  be revised to meet current compliance requirements. [name], MD [name], MD 05/28/2021 7:50:49 AM This report has been signed electronically. Number of Addenda: 0

## 2021-06-01 ENCOUNTER — Encounter (HOSPITAL_COMMUNITY): Payer: Self-pay | Admitting: Gastroenterology

## 2021-08-05 ENCOUNTER — Inpatient Hospital Stay: Payer: PPO | Attending: Hematology

## 2021-08-05 ENCOUNTER — Ambulatory Visit (HOSPITAL_COMMUNITY)
Admission: RE | Admit: 2021-08-05 | Discharge: 2021-08-05 | Disposition: A | Discharge status: Home / Self Care | Payer: PPO | Source: Ambulatory Visit | Attending: Hematology | Admitting: Hematology

## 2021-08-05 ENCOUNTER — Other Ambulatory Visit: Payer: Self-pay

## 2021-08-05 DIAGNOSIS — C155 Malignant neoplasm of lower third of esophagus: Secondary | ICD-10-CM

## 2021-08-05 DIAGNOSIS — I2510 Atherosclerotic heart disease of native coronary artery without angina pectoris: Secondary | ICD-10-CM | POA: Diagnosis not present

## 2021-08-05 DIAGNOSIS — Z79899 Other long term (current) drug therapy: Secondary | ICD-10-CM | POA: Insufficient documentation

## 2021-08-05 DIAGNOSIS — Z8501 Personal history of malignant neoplasm of esophagus: Secondary | ICD-10-CM | POA: Insufficient documentation

## 2021-08-05 DIAGNOSIS — D179 Benign lipomatous neoplasm, unspecified: Secondary | ICD-10-CM | POA: Diagnosis not present

## 2021-08-05 DIAGNOSIS — J439 Emphysema, unspecified: Secondary | ICD-10-CM | POA: Diagnosis not present

## 2021-08-05 DIAGNOSIS — Z87891 Personal history of nicotine dependence: Secondary | ICD-10-CM | POA: Insufficient documentation

## 2021-08-05 DIAGNOSIS — D7389 Other diseases of spleen: Secondary | ICD-10-CM | POA: Diagnosis not present

## 2021-08-05 DIAGNOSIS — C159 Malignant neoplasm of esophagus, unspecified: Secondary | ICD-10-CM | POA: Diagnosis not present

## 2021-08-05 DIAGNOSIS — K5730 Diverticulosis of large intestine without perforation or abscess without bleeding: Secondary | ICD-10-CM | POA: Diagnosis not present

## 2021-08-05 LAB — CMP (CANCER CENTER ONLY)
ALT: 19 U/L (ref 0–44)
AST: 20 U/L (ref 15–41)
Albumin: 4.3 g/dL (ref 3.5–5.0)
Alkaline Phosphatase: 49 U/L (ref 38–126)
Anion gap: 7 (ref 5–15)
BUN: 10 mg/dL (ref 8–23)
CO2: 28 mmol/L (ref 22–32)
Calcium: 9.1 mg/dL (ref 8.9–10.3)
Chloride: 103 mmol/L (ref 98–111)
Creatinine: 0.94 mg/dL (ref 0.61–1.24)
GFR, Estimated: >60 mL/min (ref >60)
Glucose, Bld: 127 mg/dL — ABNORMAL HIGH (ref 70–99)
Potassium: 4.2 mmol/L (ref 3.5–5.1)
Sodium: 138 mmol/L (ref 135–145)
Total Bilirubin: 0.5 mg/dL (ref 0.3–1.2)
Total Protein: 6.9 g/dL (ref 6.5–8.1)

## 2021-08-05 LAB — CBC WITH DIFFERENTIAL (CANCER CENTER ONLY)
Abs Immature Granulocytes: 0.01 10*3/uL (ref 0.00–0.07)
Basophils Absolute: 0 10*3/uL (ref 0.0–0.1)
Basophils Relative: 1 %
Eosinophils Absolute: 0.2 10*3/uL (ref 0.0–0.5)
Eosinophils Relative: 4 %
HCT: 34.8 % — ABNORMAL LOW (ref 39.0–52.0)
Hemoglobin: 11.9 g/dL — ABNORMAL LOW (ref 13.0–17.0)
Immature Granulocytes: 0 %
Lymphocytes Relative: 20 %
Lymphs Abs: 1 10*3/uL (ref 0.7–4.0)
MCH: 31.8 pg (ref 26.0–34.0)
MCHC: 34.2 g/dL (ref 30.0–36.0)
MCV: 93 fL (ref 80.0–100.0)
Monocytes Absolute: 0.4 10*3/uL (ref 0.1–1.0)
Monocytes Relative: 8 %
Neutro Abs: 3.2 10*3/uL (ref 1.7–7.7)
Neutrophils Relative %: 67 %
Platelet Count: 189 10*3/uL (ref 150–400)
RBC: 3.74 MIL/uL — ABNORMAL LOW (ref 4.22–5.81)
RDW: 12.7 % (ref 11.5–15.5)
WBC Count: 4.8 10*3/uL (ref 4.0–10.5)
nRBC: 0 % (ref 0.0–0.2)

## 2021-08-05 LAB — CEA (IN HOUSE-CHCC): CEA (CHCC-In House): 9.54 ng/mL — ABNORMAL HIGH (ref 0.00–5.00)

## 2021-08-05 MED ORDER — SODIUM CHLORIDE (PF) 0.9 % IJ SOLN
INTRAMUSCULAR | Status: AC
  Filled 2021-08-05: qty 50

## 2021-08-05 MED ORDER — IOHEXOL 300 MG/ML  SOLN
100.0000 mL | Freq: Once | INTRAMUSCULAR | Status: AC | PRN
  Administered 2021-08-05: 100 mL via INTRAVENOUS

## 2021-08-09 ENCOUNTER — Inpatient Hospital Stay (HOSPITAL_BASED_OUTPATIENT_CLINIC_OR_DEPARTMENT_OTHER): Payer: PPO | Admitting: Hematology

## 2021-08-09 ENCOUNTER — Other Ambulatory Visit: Payer: Self-pay

## 2021-08-09 VITALS — BP 142/71 | HR 57 | Temp 98.6°F | Resp 16 | Wt 157.6 lb

## 2021-08-09 DIAGNOSIS — C155 Malignant neoplasm of lower third of esophagus: Secondary | ICD-10-CM | POA: Diagnosis not present

## 2021-08-09 DIAGNOSIS — Z87891 Personal history of nicotine dependence: Secondary | ICD-10-CM | POA: Diagnosis not present

## 2021-08-09 DIAGNOSIS — Z8501 Personal history of malignant neoplasm of esophagus: Secondary | ICD-10-CM | POA: Diagnosis not present

## 2021-08-09 DIAGNOSIS — Z79899 Other long term (current) drug therapy: Secondary | ICD-10-CM | POA: Diagnosis not present

## 2021-08-09 NOTE — Progress Notes (Signed)
James Dunn   Telephone:(336) [phone] Fax:(336) [phone]   Clinic Follow up Note   Patient Care Team: Denita Lung, MD as PCP - General (Family Medicine) [name], MD as Consulting Physician (Gastroenterology) [name], MD as Consulting Physician (Hematology) Minus Breeding, MD as Consulting Physician (Cardiology)  Date of Service:  08/09/2021  CHIEF COMPLAINT: f/u of esophageal cancer  CURRENT THERAPY:  Surveillance  ASSESSMENT & PLAN:  James Dunn is a 71 y.o. male with   1. Adenocarcinoma of distal esophagus, T3N2M0, stage IVA, ypT0N0  -His 08/30/19 EGD showed a large, fungating, partially circumferential mass in the lower third of the esophagus causing dysphagia. His PET from 09/20/19 showed no evidence of metastatic disease.  -His EUS from 09/13/19 which showed locally advanced adenocarcinoma invading the muscular layer with 2 enlarged LNs, T3N2M0. -He completed standard neoadjuvant concurrent ChemoRT with weekly Carboplatin and Taxol for 6 weeks 09/30/19-11/06/19.  -He proceeded with surgical resection by Dr Servando Snare on 01/01/20. Surgical path showed complete response to neoadjuvant chemoRT with no residual disease in surgery. Adjuvant treatment not recommended -He is currently on surveillance. Most recent CT CAP on 08/05/21 showed NED. CEA from the same day was overall stable at 9.54. -We reviewed surveillance plan-- lab and f/u every 3 months, scan every 6 months for first three years. Then move to lab and f/u every 6 months and scan every year to complete five years. -He is clinically doing well. Labs from 2/16 reviewed, CBC and CMP overall stable. There is no clinical concern for recurrence.  -He is coming up on 2 years since his diagnosis. Will continue surveillance. Next CT CAP in 07/2022.  -F/u in 4 months   2. Dysphagia -much improved -s/p multiple esophageal dilatations by Dr. [name] after surgery, will continue as needed, last in 05/2021   3. H/o  Tobacco and alcohol use -He has long term tobacco abuse, quit in 03/2019.  -He noted he has cut out alcohol in 09/2019, still drinks some time. I recommend he continue cessation.   4 HTN, HL, DM, GERD  -Continue medications and f/u with PCP.      PLAN: -scan and lab reviewed, NED -lab and f/u in 4 months   No problem-specific Assessment & Plan notes found for this encounter.   SUMMARY OF ONCOLOGIC HISTORY: Oncology History Overview Note  Cancer Staging Esophageal cancer [facility]) Staging form: Esophagus - Adenocarcinoma, AJCC 8th Edition - Clinical stage from 09/13/2019: Stage IVA (cT3, cN2, cM0) - Signed by [name], MD on 09/22/2019 - Pathologic stage from 01/07/2020: Stage I (ypT0, pN0, cM0, G2) - Signed by [name], MD on 01/07/2020    Esophageal cancer ([place])  08/30/2019 Procedure   EGD by Dr. [name] 08/30/19  IMPRESSION - Partially obstructing, malignant esophageal tumor was found in the lower third of the esophagus. Biopsied. Injected. - Normal stomach. - Normal examined duodenum.   08/30/2019 Initial Biopsy   FINAL MICROSCOPIC DIAGNOSIS:   A. ESOPHAGUS, BIOPSY:  - At least intramucosal adenocarcinoma.  - See comment.   COMMENT:   - The depth of invasion can not be determined due to the superficial  nature of the biopsy.  Dr.  Vic Ripper has reviewed the case and concurs  with this interpretation.  Additional studies can be performed upon  clinician request.    09/05/2019 Imaging   CT CAP W Contrast 09/05/19  IMPRESSION: 1. Soft tissue mass noted distal esophagus, compatible with known neoplasm. There is a small 6 mm  short axis adjacent paraesophageal node, concerning for metastatic disease. 2. Upper normal to mildly enlarged hepatoduodenal ligament lymph node, with upper normal right para-aortic nodes. As metastatic disease cannot be excluded, PET-CT may prove helpful to further evaluate. 3. 1.9 x 1.3 cm nodular collection of soft tissue posterior to  the descending duodenum. This is in close proximity to the pancreatic head but a definite communication of parenchyma between these 2 structures is not discernible by CT. 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 Attention on follow-up recommended. 4. 6 mm perifissural nodule right middle lobe, likely a subpleural lymph node. Attention on follow-up recommended. 5. Hepatomegaly with hepatic steatosis. 6. Ascending thoracic aorta measures 4 cm diameter. Recommend annual imaging followup by CTA or MRA. This recommendation follows 2010 ACCF/AHA/AATS/ACR/ASA/SCA/SCAI/SIR/STS/SVM Guidelines for the Diagnosis and Management of Patients with Thoracic Aortic Disease. Circulation. 2010; 121: Z972-Q206. Aortic aneurysm NOS (ICD10-I71.9) 7. Left-sided IVC, normal variant.   09/11/2019 Tumor Marker   Baseline CEA 25.12 on 09/11/19   09/13/2019 Cancer Staging   Staging form: Esophagus - Adenocarcinoma, AJCC 8th Edition - Clinical stage from 09/13/2019: Stage IVA (cT3, cN2, cM0) - Signed by [name], MD on 09/22/2019    09/13/2019 Procedure   EUS by Dr. [name] 09/13/19  IMPRESSION - A mass was found in the lower third of the esophagus. A tissue diagnosis was obtained prior to this exam. This is of adenocarcinoma. This was staged T3 N3 Mx by endosonographic criteria. - No specimens collected.   09/20/2019 PET scan   PET IMPRESSION: 1. Hypermetabolic distal esophageal primary. 2. Low-level hypermetabolism within upper abdominal nodes. These are technically nonspecific, but favored to be reactive in the setting of hepatic steatosis and hepatomegaly. 3. Otherwise, no evidence of metastatic disease. 4. Anal hypermetabolism could be physiologic. Consider correlation with physical exam and colonoscopy, if patient is not up-to-date. 5. Incidental findings, including: Aortic atherosclerosis (ICD10-I70.0), coronary artery atherosclerosis and emphysema (ICD10-J43.9). Sinus disease  and bilateral mastoid effusions.   09/22/2019 Initial Diagnosis   Esophageal cancer ([place])   09/30/2019 - 11/04/2019 Chemotherapy   concurrent ChemoRT with weekly Carboplatin and Taxol starting 09/30/19. Last dose was Taxol alone given neutropenia.    09/30/2019 - 11/06/2019 Radiation Therapy   concurrent ChemoRT with Dr. Lisbeth Renshaw starting 09/30/19-11/06/19   12/05/2019 Imaging   CT CAP w Contrast  IMPRESSION: 1. Interval response to therapy. Approximately 50% reduction in size of distal esophageal mass 2. No new or progressive disease identified within the chest or abdomen. 3. Emphysema and aortic atherosclerosis. 4. Left main and 3 vessel coronary artery calcifications.   Aortic Atherosclerosis (ICD10-I70.0) and Emphysema (ICD10-J43.9).     01/01/2020 Surgery   VIDEO BRONCHOSCOPY WITH BRONCHIAL WASHING FOR CULTURES AND GRAM STAIN and TRANSHIATAL TOTAL ESOPHAGECTOMY COMPLETE WITH NIMS TUBE and JEJUNOSTOMY and CHEST TUBE INSERTION with Dr Servando Snare.    01/01/2020 Pathology Results   FINAL MICROSCOPIC DIAGNOSIS:   A. ESOPHAGUS AND PARTIAL STOMACH, ESOPHAGECTOMY:  - No residual carcinoma identified.  - Reactive changes, mixed inflammation, and ulcer.  - Sixteen of sixteen lymph nodes negative for carcinoma (0/16).  - See oncology table.    01/07/2020 Cancer Staging   Staging form: Esophagus - Adenocarcinoma, AJCC 8th Edition - Pathologic stage from 01/07/2020: Stage I (ypT0, pN0, cM0, G2) - Signed by [name], MD on 01/07/2020    08/05/2020 Imaging   CT CAP  IMPRESSION: 1. No evidence of thoracic metastasis. 2. Gastric pull-up anatomy without evidence of local recurrence. 3. No evidence  of metastatic disease in the abdomen pelvis. 4. Moderate volume stool throughout the colon. 5. Aortic Atherosclerosis (ICD10-I70.0).     05/28/2021 Procedure   Upper GI Endoscopy, Dr. [name]  Impression: - Benign-appearing esophageal stenosis. Dilated. - A previous surgical anastomosis was  found, characterized by healthy appearing mucosa. - Normal examined duodenum. - No specimens collected.   08/05/2021 Imaging   EXAM: CT CHEST, ABDOMEN, AND PELVIS WITH CONTRAST  IMPRESSION: 1. Stable postsurgical changes of prior gastric pull-through without new or progressive findings to suggest recurrent or metastatic disease within the chest, abdomen, or pelvis. 2. Hepatic steatosis. 3. Colonic diverticulosis without findings of acute diverticulitis. 4. Aortic Atherosclerosis (ICD10-I70.0) and Emphysema (ICD10-J43.9).      INTERVAL HISTORY:  James Dunn is here for a follow up of esophageal cancer. He was last seen by me on 05/10/21. He presents to the clinic alone. He reports he is doing well overall. He notes he is still eating small meals. He denies any stomach issues or fatigue. He also denies any residual neuropathy symptoms.   All other systems were reviewed with the patient and are negative.  MEDICAL HISTORY:  Past Medical History:  Diagnosis Date   Allergy    Asthma    Controlled type 2 diabetes mellitus without complication, without long-term current use of insulin ([place]) 06/25/2012   Diverticulosis    ED (erectile dysfunction) 12/28/2010   Esophageal cancer ([place]) 09/22/2019   Former smoker 06/25/2012   GERD (gastroesophageal reflux disease) 2002   History of colonic polyps 05/11/2016   Hyperlipidemia associated with type 2 diabetes mellitus ([place]) 05/11/2016   Hypertension associated with diabetes ([place]) 08/02/2017   denies   Mild intermittent asthma without complication 09/26/8117   Seasonal allergic rhinitis due to pollen 11/29/2016   Thoracic aortic aneurysm    Ascending thoracic aorta measures 4 cm diameter   Tubular adenoma of colon 12/05/2016    SURGICAL HISTORY: Past Surgical History:  Procedure Laterality Date   BALLOON DILATION N/A 03/20/2020   Procedure: BALLOON DILATION;  Surgeon: [name], MD;  Location: [facility];  Service: Endoscopy;  Laterality:  N/A;   BALLOON DILATION N/A 04/03/2020   Procedure: BALLOON DILATION;  Surgeon: [name], MD;  Location: [facility];  Service: Endoscopy;  Laterality: N/A;   BALLOON DILATION N/A 04/17/2020   Procedure: BALLOON DILATION;  Surgeon: [name], MD;  [facility]: [facility];  Service: Endoscopy;  Laterality: N/A;   BALLOON DILATION N/A 05/01/2020   Procedure: BALLOON DILATION;  Surgeon: [name], MD;  [facility]: [facility];  Service: Endoscopy;  Laterality: N/A;   BALLOON DILATION N/A 05/22/2020   Procedure: BALLOON DILATION;  Surgeon: [name], MD;  [facility];  Service: Endoscopy;  Laterality: N/A;   BALLOON DILATION N/A 06/05/2020   Procedure: BALLOON DILATION;  Surgeon: [name], MD;  [facility];  Service: Endoscopy;  Laterality: N/A;   BALLOON DILATION N/A 07/10/2020   Procedure: BALLOON DILATION;  Surgeon: [name], MD;  [facility];  Service: Endoscopy;  Laterality: N/A;   BALLOON DILATION N/A 09/24/2020   Procedure: BALLOON DILATION;  Surgeon: [name], MD;  [facility];  Service: Endoscopy;  Laterality: N/A;   BALLOON DILATION N/A 05/28/2021   Procedure: BALLOON DILATION;  Surgeon: [name], MD;  [facility];  Service: Endoscopy;  Laterality: N/A;   BIOPSY  08/30/2019   Procedure: BIOPSY;  Surgeon: [name], MD;  [facility];  Service: Endoscopy;;   CHEST TUBE INSERTION Left 01/01/2020  Procedure: CHEST TUBE INSERTION;  Surgeon: [name], MD;  Location: [place];  Service: Thoracic;  Laterality: Left;   COMPLETE ESOPHAGECTOMY N/A 01/01/2020   Procedure: TRANSHIATAL TOTAL ESOPHAGECTOMY COMPLETE WITH NIMS TUBE;  Surgeon: [name], MD;  Location: [facility];  Service: Thoracic;  Laterality: N/A;   ESOPHAGOGASTRODUODENOSCOPY (EGD) WITH PROPOFOL N/A 08/30/2019   Procedure: ESOPHAGOGASTRODUODENOSCOPY (EGD) WITH PROPOFOL;  Surgeon: [name], MD;  [facility];   Service: Endoscopy;  Laterality: N/A;   ESOPHAGOGASTRODUODENOSCOPY (EGD) WITH PROPOFOL N/A 09/13/2019   Procedure: ESOPHAGOGASTRODUODENOSCOPY (EGD) WITH PROPOFOL;  Surgeon: [name], MD;  [facility];  Service: Endoscopy;  Laterality: N/A;   ESOPHAGOGASTRODUODENOSCOPY (EGD) WITH PROPOFOL N/A 03/20/2020   Procedure: ESOPHAGOGASTRODUODENOSCOPY (EGD) WITH PROPOFOL;  Surgeon: [name], MD;  [facility];  Service: Endoscopy;  Laterality: N/A;   ESOPHAGOGASTRODUODENOSCOPY (EGD) WITH PROPOFOL N/A 04/03/2020   Procedure: ESOPHAGOGASTRODUODENOSCOPY (EGD) WITH PROPOFOL;  Surgeon: [name], MD;  [facility];  Service: Endoscopy;  Laterality: N/A;   ESOPHAGOGASTRODUODENOSCOPY (EGD) WITH PROPOFOL N/A 04/17/2020   Procedure: ESOPHAGOGASTRODUODENOSCOPY (EGD) WITH PROPOFOL;  Surgeon: [name], MD;  [facility];  Service: Endoscopy;  Laterality: N/A;   ESOPHAGOGASTRODUODENOSCOPY (EGD) WITH PROPOFOL N/A 05/01/2020   Procedure: ESOPHAGOGASTRODUODENOSCOPY (EGD) WITH PROPOFOL;  Surgeon: [name], MD;  [facility];  Service: Endoscopy;  Laterality: N/A;   ESOPHAGOGASTRODUODENOSCOPY (EGD) WITH PROPOFOL N/A 05/22/2020   Procedure: ESOPHAGOGASTRODUODENOSCOPY (EGD) WITH PROPOFOL;  Surgeon: [name], MD;  [facility];  Service: Endoscopy;  Laterality: N/A;   ESOPHAGOGASTRODUODENOSCOPY (EGD) WITH PROPOFOL N/A 06/05/2020   Procedure: ESOPHAGOGASTRODUODENOSCOPY (EGD) WITH PROPOFOL;  Surgeon: [name], MD;  [facility];  Service: Endoscopy;  Laterality: N/A;   ESOPHAGOGASTRODUODENOSCOPY (EGD) WITH PROPOFOL N/A 07/10/2020   Procedure: ESOPHAGOGASTRODUODENOSCOPY (EGD) WITH PROPOFOL;  Surgeon: [name], MD;  [facility];  Service: Endoscopy;  Laterality: N/A;   ESOPHAGOGASTRODUODENOSCOPY (EGD) WITH PROPOFOL N/A 09/24/2020   Procedure: ESOPHAGOGASTRODUODENOSCOPY (EGD) WITH PROPOFOL;  Surgeon: [name], MD;   [facility];  Service: Endoscopy;  Laterality: N/A;   ESOPHAGOGASTRODUODENOSCOPY (EGD) WITH PROPOFOL N/A 05/28/2021   Procedure: ESOPHAGOGASTRODUODENOSCOPY (EGD) WITH PROPOFOL;  Surgeon: [name], MD;  [facility];  Service: Endoscopy;  Laterality: N/A;   JEJUNOSTOMY N/A 01/01/2020   Procedure: [name];  Surgeon: [name], MD;  Location: [place];  Service: Thoracic;  Laterality: N/A;   SKIN BIOPSY N/A 03/04/2019   upper back Nerofibroma and follicular cyst    SUBMUCOSAL INJECTION  08/30/2019   Procedure: SUBMUCOSAL INJECTION;  Surgeon: [name], MD;  [facility];  Service: Endoscopy;;   UPPER ESOPHAGEAL ENDOSCOPIC ULTRASOUND (EUS) N/A 09/13/2019   Procedure: UPPER ESOPHAGEAL ENDOSCOPIC ULTRASOUND (EUS);  Surgeon: [name], MD;  Location: [facility];  Service: Endoscopy;  Laterality: N/A;   VIDEO BRONCHOSCOPY N/A 01/01/2020   Procedure: VIDEO BRONCHOSCOPY WITH BRONCHIAL WASHING FOR CULTURES AND GRAM STAIN;  Surgeon: [name], MD;  Location: [place];  Service: Thoracic;  Laterality: N/A;    I have reviewed the social history and family history with the patient and they are unchanged from previous note.  ALLERGIES:  has No Known Allergies.  MEDICATIONS:  Current Outpatient Medications  Medication Sig Dispense Refill   albuterol (VENTOLIN HFA) 108 (90 Base) MCG/ACT inhaler Inhale 1 puff into the lungs every 6 (six) hours as needed for wheezing or shortness of breath.     atorvastatin (LIPITOR) 10 MG tablet Take 1 tablet (10 mg total) by mouth daily.  90 tablet 3   Azelaic Acid 15 % gel Apply 1 application topically daily as needed (Rosacea).     esomeprazole (NEXIUM) 40 MG capsule Take 40 mg by mouth daily at 12 noon.     metFORMIN (GLUCOPHAGE) 500 MG tablet Take 1 tablet (500 mg total) by mouth 2 (two) times daily with a meal. 180 tablet 1   Misc Natural Products (ESSIAC TONIC) LIQD Take 2 oz by mouth daily. Herbal Tea      Multiple Vitamins-Minerals (MULTIVITAMIN WITH MINERALS) tablet Take 1 tablet by mouth daily.     No current facility-administered medications for this visit.    PHYSICAL EXAMINATION: ECOG PERFORMANCE STATUS: 0 - Asymptomatic  Vitals:   08/09/21 0822  BP: (!) 142/71  Pulse: (!) 57  Resp: 16  Temp: 98.6 F (37 C)  SpO2: 100%   Wt Readings from Last 3 Encounters:  08/09/21 157 lb 9.6 oz (71.5 kg)  05/28/21 157 lb (71.2 kg)  05/10/21 156 lb 3.2 oz (70.9 kg)     GENERAL:alert, no distress and comfortable SKIN: skin color normal, no rashes or significant lesions EYES: normal, Conjunctiva are pink and non-injected, sclera clear  NEURO: alert & oriented x 3 with fluent speech  LABORATORY DATA:  I have reviewed the data as listed CBC Latest Ref Rng & Units 08/05/2021 05/10/2021 02/04/2021  WBC 4.0 - 10.5 K/uL 4.8 6.0 5.6  Hemoglobin 13.0 - 17.0 g/dL 11.9(L) 12.9(L) 12.2(L)  Hematocrit 39.0 - 52.0 % 34.8(L) 38.0(L) 36.1(L)  Platelets 150 - 400 K/uL 189 174 177     CMP Latest Ref Rng & Units 08/05/2021 05/10/2021 02/04/2021  Glucose 70 - 99 mg/dL 127(H) 127(H) 128(H)  BUN 8 - 23 mg/dL 10 10 10   Creatinine 0.61 - 1.24 mg/dL 0.94 1.07 1.02  Sodium 135 - 145 mmol/L 138 141 139  Potassium 3.5 - 5.1 mmol/L 4.2 4.5 4.5  Chloride 98 - 111 mmol/L 103 106 103  CO2 22 - 32 mmol/L 28 28 27   Calcium 8.9 - 10.3 mg/dL 9.1 9.4 9.2  Total Protein 6.5 - 8.1 g/dL 6.9 7.6 7.1  Total Bilirubin 0.3 - 1.2 mg/dL 0.5 0.5 0.7  Alkaline Phos 38 - 126 U/L 49 59 70  AST 15 - 41 U/L 20 33 19  ALT 0 - 44 U/L 19 33 24      RADIOGRAPHIC STUDIES: I have personally reviewed the radiological images as listed and agreed with the findings in the report. No results found.    No orders of the defined types were placed in this encounter.  All questions were answered. The patient knows to call the clinic with any problems, questions or concerns. No barriers to learning was detected.      [name],  MD 08/09/2021   I, Wilburn Mylar, am acting as scribe for [name], MD.   I have reviewed the above documentation for accuracy and completeness, and I agree with the above.

## 2021-08-18 ENCOUNTER — Ambulatory Visit: Payer: PPO | Admitting: Family Medicine

## 2021-09-01 ENCOUNTER — Telehealth: Payer: Self-pay | Admitting: Family Medicine

## 2021-09-01 NOTE — Telephone Encounter (Signed)
error 

## 2021-09-06 ENCOUNTER — Other Ambulatory Visit: Payer: Self-pay

## 2021-09-06 ENCOUNTER — Encounter: Payer: Self-pay | Admitting: Family Medicine

## 2021-09-06 ENCOUNTER — Ambulatory Visit (INDEPENDENT_AMBULATORY_CARE_PROVIDER_SITE_OTHER): Payer: PPO | Admitting: Family Medicine

## 2021-09-06 VITALS — BP 132/66 | HR 59 | Temp 97.5°F | Ht 66.5 in | Wt 154.2 lb

## 2021-09-06 DIAGNOSIS — Z86010 Personal history of colonic polyps: Secondary | ICD-10-CM

## 2021-09-06 DIAGNOSIS — C155 Malignant neoplasm of lower third of esophagus: Secondary | ICD-10-CM

## 2021-09-06 DIAGNOSIS — Z9889 Other specified postprocedural states: Secondary | ICD-10-CM

## 2021-09-06 DIAGNOSIS — Z0000 Encounter for general adult medical examination without abnormal findings: Secondary | ICD-10-CM | POA: Diagnosis not present

## 2021-09-06 DIAGNOSIS — I700 Atherosclerosis of aorta: Secondary | ICD-10-CM

## 2021-09-06 DIAGNOSIS — B351 Tinea unguium: Secondary | ICD-10-CM | POA: Diagnosis not present

## 2021-09-06 DIAGNOSIS — E1159 Type 2 diabetes mellitus with other circulatory complications: Secondary | ICD-10-CM | POA: Diagnosis not present

## 2021-09-06 DIAGNOSIS — E785 Hyperlipidemia, unspecified: Secondary | ICD-10-CM

## 2021-09-06 DIAGNOSIS — E119 Type 2 diabetes mellitus without complications: Secondary | ICD-10-CM

## 2021-09-06 DIAGNOSIS — J4520 Mild intermittent asthma, uncomplicated: Secondary | ICD-10-CM

## 2021-09-06 DIAGNOSIS — I152 Hypertension secondary to endocrine disorders: Secondary | ICD-10-CM

## 2021-09-06 DIAGNOSIS — Z9049 Acquired absence of other specified parts of digestive tract: Secondary | ICD-10-CM

## 2021-09-06 DIAGNOSIS — J301 Allergic rhinitis due to pollen: Secondary | ICD-10-CM

## 2021-09-06 DIAGNOSIS — E1169 Type 2 diabetes mellitus with other specified complication: Secondary | ICD-10-CM

## 2021-09-06 LAB — POCT UA - MICROALBUMIN
Albumin/Creatinine Ratio, Urine, POC: 7.3
Creatinine, POC: 220.5 mg/dL
Microalbumin Ur, POC: 16.1 mg/L

## 2021-09-06 LAB — POCT GLYCOSYLATED HEMOGLOBIN (HGB A1C): Hemoglobin A1C: 5.9 % — AB (ref 4.0–5.6)

## 2021-09-06 NOTE — Patient Instructions (Signed)
?  James Dunn , ?Thank you for taking time to come for your Medicare Wellness Visit. I appreciate your ongoing commitment to your health goals. Please review the following plan we discussed and let me know if I can assist you in the future.  Keep up the good work ? ? ?  ?This is a list of the screening recommended for you and due dates:  ?Health Maintenance  ?Topic Date Due  ? Pneumonia Vaccine (3 - PPSV23 if available, else PCV20) 09/07/2022*  ? Urine Protein Check  01/20/2022  ? Hemoglobin A1C  03/09/2022  ? Eye exam for diabetics  05/05/2022  ? Complete foot exam   09/07/2022  ? Colon Cancer Screening  12/01/2026  ? Tetanus Vaccine  07/07/2029  ? Flu Shot  Completed  ? COVID-19 Vaccine  Completed  ? Hepatitis C Screening: USPSTF Recommendation to screen - Ages 50-79 yo.  Completed  ? Zoster (Shingles) Vaccine  Completed  ? HPV Vaccine  Aged Out  ?*Topic was postponed. The date shown is not the original due date.  ?  ?

## 2021-09-06 NOTE — Progress Notes (Signed)
James Dunn is a 71 y.o. male who presents for annual wellness visit ,CPE and follow-up on chronic medical conditions.  He has a history of esophageal cancer and subsequent esophagectomy.  He is followed by oncology for this and at this point seems to be doing fairly well.  He has had recent blood work as well as CT scanning.  His weight seems to be stable.  He seems to be handling this psychologically fairly well.  He continues on metformin for his diabetes and is also taking atorvastatin without difficulty.  Continues on Nexium.  He does have to break up his medications in order to swallow them without difficulty.  He has stopped taking the lisinopril.  His alcohol consumption is usually 2 beverages per day.  He does have x-ray evidence of atherosclerosis.  He rarely uses his albuterol inhaler.  His allergies seem to be under good control.  He is now much more physically active and is playing golf fairly regularly. ? ? ?Immunizations and Health Maintenance ?Immunization History  ?Administered Date(s) Administered  ? DTaP 11/21/1991  ? Fluad Quad(high Dose 65+) 02/26/2019, 03/16/2020  ? Influenza Split 06/28/2011  ? Influenza, High Dose Seasonal PF 05/21/2015, 03/30/2016, 04/04/2017, 05/22/2018  ? Influenza, Seasonal, Injecte, Preservative Fre 07/10/2012  ? Influenza,inj,Quad PF,6+ Mos 04/02/2013  ? Influenza-Unspecified 03/24/2021  ? Moderna Covid-19 Vaccine Bivalent Booster 52yr & up 03/24/2021  ? Moderna Sars-Covid-2 Vaccination 08/04/2019, 09/02/2019, 02/24/2020, 10/29/2020  ? Pneumococcal Conjugate-13 05/11/2016  ? Pneumococcal Polysaccharide-23 01/17/2001  ? Tdap 12/03/2009, 07/08/2019  ? Zoster Recombinat (Shingrix) 07/18/2017, 09/21/2017  ? Zoster, Live 11/21/2011, 07/18/2017  ? ?There are no preventive care reminders to display for this patient. ? ? ?Last colonoscopy: 11/30/16 Dr. MCollene Mares?Last PSA: N/A ?Dentist: Q six months  ?Ophtho: Q year ?Exercise: 3-4 x a week golf ? ?Other doctors caring for patient  include: Dr.Mann/ [facility] ?                     Dr.Feng oncology ?                  Dr. GKaty Fitcheye ?          Dr. GDominica Severindermatologist ?           Dr. GGeoffry Paradise? ?Advanced Directives: ?Does Patient Have a Medical Advance Directive?: Yes ?Type of Advance Directive: Living will ?Does patient want to make changes to medical advance directive?: No - Patient declined ? ?Depression screen:  See questionnaire below.   ?  ?[facility] 09/06/2021 07/20/2020 10/10/2018 08/16/2016  ?Decreased Interest 0 0 0 0  ?Down, Depressed, Hopeless 0 0 0 0  ?PHQ - 2 Score 0 0 0 0  ?Some recent data might be hidden  ? ? ?Fall Screen: See Questionaire below. ?  ?Fall Risk  09/06/2021 07/20/2020 10/10/2018 11/30/2017 08/16/2016  ?Falls in the past year? 0 0 0 No No  ?Number falls in past yr: 0 - - - -  ?Injury with Fall? 0 - - - -  ?Risk for fall due to : No Fall Risks - - - -  ?Follow up Falls evaluation completed - - - -  ? ? ?ADL screen:  See questionnaire below.  ?Functional Status Survey: ?Is the patient deaf or have difficulty hearing?: No ?Does the patient have difficulty seeing, even when wearing glasses/contacts?: No ?Does the patient have difficulty concentrating, remembering, or making decisions?: No ?Does the patient have difficulty walking or  climbing stairs?: No ?Does the patient have difficulty dressing or bathing?: No ?Does the patient have difficulty doing errands alone such as visiting a doctor's office or shopping?: No ? ? ?Review of Systems ? ?Constitutional: -, -unexpected weight change, -anorexia, -fatigue ?Allergy: -sneezing, -itching, -congestion ?Dermatology: denies changing moles, rash, lumps ?ENT: -runny nose, -ear pain, -sore throat,  ?Cardiology:  -chest pain, -palpitations, -orthopnea, ?Respiratory: -cough, -shortness of breath, -dyspnea on exertion, -wheezing,  ?Gastroenterology: -abdominal pain, -nausea, -vomiting, -diarrhea, -constipation, -dysphagia ?Hematology: -bleeding or bruising  problems ?Musculoskeletal: -arthralgias, -myalgias, -joint swelling, -back pain, - ?Ophthalmology: -vision changes,  ?Urology: -dysuria, -difficulty urinating,  -urinary frequency, -urgency, incontinence ?Neurology: -, -numbness, , -memory loss, -falls, -dizziness ? ? ? ?PHYSICAL EXAM: ? ?General Appearance: Alert, cooperative, no distress, appears stated age ?Head: Normocephalic, without obvious abnormality, atraumatic ?Eyes: PERRL, conjunctiva/corneas clear, EOM's intact,  ?Ears: Normal TM's and external ear canals ?Nose: Nares normal, mucosa normal, no drainage or sinus   tenderness ?Throat: Lips, mucosa, and tongue normal; teeth and gums normal ?Neck: Supple, no lymphadenopathy, thyroid:no enlargement/tenderness/nodules; no carotid bruit or JVD ?Lungs: Clear to auscultation bilaterally without wheezes, rales or ronchi; respirations unlabored ?Heart: Regular rate and rhythm, S1 and S2 normal, no murmur, rub or gallop ?Abdomen: Soft, non-tender, nondistended, normoactive bowel sounds, no masses, no hepatosplenomegaly ?Extremities: No clubbing, cyanosis or edema diabetic foot exam is normal once evidence of onychomycosis ?Pulses: 2+ and symmetric all extremities ?Skin: Skin color, texture, turgor normal, no rashes or lesions ?Lymph nodes: Cervical, supraclavicular, and axillary nodes normal ?Neurologic: CNII-XII intact, normal strength, sensation and gait; reflexes 2+ and symmetric throughout   ?Psych: Normal mood, affect, hygiene and grooming ?Hemoglobin A1c is 5.9. ?ASSESSMENT/PLAN: ?Routine general medical examination at a health care facility ? ?Hypertension associated with diabetes ([place]) ? ?Mild intermittent asthma without complication ? ?Seasonal allergic rhinitis due to pollen ? ?Malignant neoplasm of lower third of esophagus (HCC) ? ?Controlled type 2 diabetes mellitus without complication, without long-term current use of insulin ([place]) - Plan: POCT glycosylated hemoglobin (Hb A1C), POCT UA -  Microalbumin ? ?Hyperlipidemia associated with type 2 diabetes mellitus ([place]) - Plan: Lipid panel ? ?H/O esophagectomy ? ?History of colonic polyps ? ?Atherosclerosis of aorta ([place]) - Plan: Lipid panel ? ?Onychomycosis ? ? ?Discussed at least 30 minutes of aerobic activity at least 5 days/week; ; healthy diet and alcohol recommendations (less than or equal to 2 drinks/day) reviewed;  Immunization recommendations discussed.  Colonoscopy recommendations reviewed. ?Overall he seems to be doing quite nicely.  Discussed continuing on his present medication regimen.  We will hold off on the lisinopril as his hemoglobin A1c continues to improve.  May at some point consider the labeling him diabetes in remission. ? ?Medicare Attestation ?I have personally reviewed: ?The patient's medical and social history ?Their use of alcohol, tobacco or illicit drugs ?Their current medications and supplements ?The patient's functional ability including ADLs,fall risks, home safety risks, cognitive, and hearing and visual impairment ?Diet and physical activities ?Evidence for depression or mood disorders ? ?The patient's weight, height, and BMI have been recorded in the chart.  I have made referrals, counseling, and provided education to the patient based on review of the above and I have provided the patient with a written personalized care plan for preventive services.   ? ? ?Jill Alexanders, MD   09/06/2021  ? ? ?  ?

## 2021-09-07 LAB — LIPID PANEL
Chol/HDL Ratio: 2.8 ratio (ref 0.0–5.0)
Cholesterol, Total: 165 mg/dL (ref 100–199)
HDL: 58 mg/dL (ref >39)
LDL Chol Calc (NIH): 89 mg/dL (ref 0–99)
Triglycerides: 98 mg/dL (ref 0–149)
VLDL Cholesterol Cal: 18 mg/dL (ref 5–40)

## 2021-09-07 MED ORDER — ATORVASTATIN CALCIUM 20 MG PO TABS: 20.0000 mg | ORAL_TABLET | Freq: Every day | ORAL | 3 refills | Status: DC

## 2021-09-07 NOTE — Addendum Note (Signed)
Addended by: Denita Lung on: 09/07/2021 11:40 AM ? ? Modules accepted: Orders ? ?

## 2021-09-21 ENCOUNTER — Other Ambulatory Visit: Payer: Self-pay | Admitting: Family Medicine

## 2021-09-27 DIAGNOSIS — L821 Other seborrheic keratosis: Secondary | ICD-10-CM | POA: Diagnosis not present

## 2021-09-27 DIAGNOSIS — L578 Other skin changes due to chronic exposure to nonionizing radiation: Secondary | ICD-10-CM | POA: Diagnosis not present

## 2021-09-27 DIAGNOSIS — D492 Neoplasm of unspecified behavior of bone, soft tissue, and skin: Secondary | ICD-10-CM | POA: Diagnosis not present

## 2021-09-27 DIAGNOSIS — L718 Other rosacea: Secondary | ICD-10-CM | POA: Diagnosis not present

## 2021-09-27 HISTORY — PX: OTHER SURGICAL HISTORY: SHX169

## 2021-10-01 ENCOUNTER — Telehealth (INDEPENDENT_AMBULATORY_CARE_PROVIDER_SITE_OTHER): Payer: PPO | Admitting: Family Medicine

## 2021-10-01 ENCOUNTER — Encounter: Payer: Self-pay | Admitting: Family Medicine

## 2021-10-01 VITALS — Temp 97.8°F | Wt 154.0 lb

## 2021-10-01 DIAGNOSIS — J069 Acute upper respiratory infection, unspecified: Secondary | ICD-10-CM | POA: Diagnosis not present

## 2021-10-01 NOTE — Progress Notes (Signed)
? ?  Subjective:  ? ? Patient ID: James Dunn, male    DOB: 06-Jul-1950, 71 y.o.   MRN: [id number] ? ?HPI ?Documentation for virtual audio and video telecommunications through [place] encounter: ?The patient was located at home. 2 patient identifiers used.  ?The provider was located in the office. ?The patient did consent to this visit and is aware of possible charges through their insurance for this visit. ?The other persons participating in this telemedicine service were none. ?Time spent on call was 5 minutes and in review of previous records >17 minutes total for counseling and coordination of care. ?This virtual service is not related to other E/M service within previous 7 days.  ?He complains of a 3-day history of started with slight sore throat, cough, rhinorrhea and watery eyes.  No sneezing, fever, shortness of breath.  No history of allergic rhinitis.  He recently tested for COVID and was negative. ? ?Review of Systems ? ?   ?Objective:  ? Physical Exam ?Alert and in no distress.  His voice appears normal. ? ? ? ?   ?Assessment & Plan:  ?Viral URI with cough ?Recommend supportive care with Tylenol for fever aches and pains, gargling, Robitussin-DM and NyQuil at night. ? ? ? ?

## 2021-10-07 ENCOUNTER — Encounter: Payer: Self-pay | Admitting: Family Medicine

## 2021-10-09 ENCOUNTER — Other Ambulatory Visit: Payer: Self-pay | Admitting: Family Medicine

## 2021-10-09 DIAGNOSIS — E119 Type 2 diabetes mellitus without complications: Secondary | ICD-10-CM

## 2021-10-09 DIAGNOSIS — E1169 Type 2 diabetes mellitus with other specified complication: Secondary | ICD-10-CM

## 2021-10-21 ENCOUNTER — Encounter: Payer: Self-pay | Admitting: Medical

## 2021-10-21 ENCOUNTER — Ambulatory Visit (INDEPENDENT_AMBULATORY_CARE_PROVIDER_SITE_OTHER): Payer: PPO | Admitting: Medical

## 2021-10-21 VITALS — BP 120/80 | HR 60 | Temp 98.6°F | Wt 156.2 lb

## 2021-10-21 DIAGNOSIS — Z87891 Personal history of nicotine dependence: Secondary | ICD-10-CM | POA: Diagnosis not present

## 2021-10-21 DIAGNOSIS — J988 Other specified respiratory disorders: Secondary | ICD-10-CM | POA: Diagnosis not present

## 2021-10-21 DIAGNOSIS — R059 Cough, unspecified: Secondary | ICD-10-CM | POA: Diagnosis not present

## 2021-10-21 MED ORDER — AZITHROMYCIN 250 MG PO TABS: ORAL_TABLET | ORAL | 0 refills | Status: DC

## 2021-10-21 MED ORDER — HYDROCODONE BIT-HOMATROP MBR 5-1.5 MG/5ML PO SOLN: 5.0000 mL | Freq: Three times a day (TID) | ORAL | 0 refills | Status: AC | PRN

## 2021-10-21 NOTE — Progress Notes (Signed)
Subjective: ? James Dunn is a 71 y.o. male who presents for ?Chief Complaint  ?Patient presents with  ? other  ?  Cough/allergies taking otc meds dayquil and Nyquil, taking robitussin as well. Hacking cough, drainage, Monday night had a fever and chills.   ?   ?Few weeks ago started getting symptoms.  Did virtual consult 10/01/21 with Dr. Redmond Dunn.   He notes runny nose, hacking cough, some eyes watery, drainage.  The cough bothers him the most.   No sore throat.  No fever now but had fever earlier this week for a few nights.  No head pressure.  No ear pain.  Coughing up colored mucous.  Using over the counter cough medicaiton, robitussin.   ? ?Former smoker.  Quit 2-3 years ago.  Smoked 40 + years, 1ppd. ? ?Hardly ever gets sick.  Doesn't drink a lot of water. ? ?Chart shows history of asthma, but he says he doesn't really have asthma, just uses inhaler every now and then.  ? ?No other aggravating or relieving factors.   ? ?No other c/o. ? ?Past Medical History:  ?Diagnosis Date  ? Allergy   ? Asthma   ? Controlled type 2 diabetes mellitus without complication, without long-term current use of insulin ([place]) 06/25/2012  ? Diverticulosis   ? ED (erectile dysfunction) 12/28/2010  ? Esophageal cancer ([place]) 09/22/2019  ? Former smoker 06/25/2012  ? GERD (gastroesophageal reflux disease) 2002  ? History of colonic polyps 05/11/2016  ? Hyperlipidemia associated with type 2 diabetes mellitus ([place]) 05/11/2016  ? Hypertension associated with diabetes ([place]) 08/02/2017  ? denies  ? Mild intermittent asthma without complication 11/14/7822  ? Seasonal allergic rhinitis due to pollen 11/29/2016  ? Thoracic aortic aneurysm ([place])   ? Ascending thoracic aorta measures 4 cm diameter  ? Tubular adenoma of colon 12/05/2016  ? ?Current Outpatient Medications on File Prior to Visit  ?Medication Sig Dispense Refill  ? albuterol (VENTOLIN HFA) 108 (90 Base) MCG/ACT inhaler Inhale 1 puff into the lungs every 6 (six) hours as needed for wheezing or  shortness of breath.    ? atorvastatin (LIPITOR) 20 MG tablet Take 1 tablet (20 mg total) by mouth daily. 90 tablet 3  ? esomeprazole (NEXIUM) 40 MG capsule TAKE 1 CAPSULE BY MOUTH EVERY DAY FOR 90 DAYS 90 capsule 1  ? metFORMIN (GLUCOPHAGE) 500 MG tablet TAKE 1 TABLET BY MOUTH 2 TIMES DAILY WITH A MEAL. 180 tablet 1  ? Misc Natural Products (ESSIAC TONIC) LIQD Take 2 oz by mouth daily. Herbal Tea    ? Multiple Vitamins-Minerals (MULTIVITAMIN WITH MINERALS) tablet Take 1 tablet by mouth daily.    ? Azelaic Acid 15 % gel Apply 1 application topically daily as needed (Rosacea). (Patient not taking: Reported on 10/21/2021)    ? ?No current facility-administered medications on file prior to visit.  ? ? ? ?The following portions of the patient's history were reviewed and updated as appropriate: allergies, current medications, past family history, past medical history, past social history, past surgical history and problem list. ? ?ROS ?Otherwise as in subjective above ? ?Objective: ?BP 120/80   Pulse 60   Temp 98.6 ?F (37 ?C)   Wt 156 lb 3.2 oz (70.9 kg)   BMI 24.83 kg/m?  ? ?General appearance: alert, no distress, well developed, well nourished ?HEENT: normocephalic, sclerae anicteric, conjunctiva pink and moist, TMs pearly, nares patent, no discharge or erythema, pharynx  with cobblestoning ?Oral cavity: somewhat dry MM, no lesions ?Neck:  supple, no lymphadenopathy, no thyromegaly, no masses, no JVD ?Heart: RRR, normal S1, S2, no murmurs ?Lungs: relatively clear, no wheezes, rhonchi, or rales ?Pulses: 2+ radial pulses, 2+ pedal pulses, normal cap refill ?Ext: no edema ? ? ?Assessment: ?Encounter Diagnoses  ?Name Primary?  ? Cough, unspecified type Yes  ? Respiratory tract infection   ? Former smoker   ? ? ? ?Plan: ?I reviewed back over his chart history and he had a CT chest February 2023.  There was some findings of emphysema.  He does not have that diagnosis listed.  He is a former smoker.  He does not get sick  often or does not have to use his inhaler very often.  I do recommend he do a baseline spirometry with his primary care provider here at a follow-up visit ? ?Discussed the current recommendations as below ? ?Patient Instructions  ?Recommendations: ?Begin Z-Pak antibiotic ?You can use Hycodan cough syrup prescription particularly at night.  This can cause drowsiness so be a little careful with this. ?Increase your water intake as this can help get rid of some of these mucus secretions and draining ?Use salt water gargles to clear out mucus from the back of your throat ?I would try your inhaler before bedtime as this can open the air sacs and help get out some of the mucus as well ?At a subsequent visit with Dr. Redmond Dunn, I recommend a baseline spirometry breathing test to check your lung volumes since there was risk finding of emphysema on your CT scan ?If you do not feel much improved by Monday or Tuesday, then let me know  ? ? ?James Dunn was seen today for other. ? ?Diagnoses and all orders for this visit: ? ?Cough, unspecified type ? ?Respiratory tract infection ? ?Former smoker ? ? ? ?Follow up: prn ?

## 2021-10-21 NOTE — Patient Instructions (Signed)
Recommendations: ?Begin Z-Pak antibiotic ?You can use Hycodan cough syrup prescription particularly at night.  This can cause drowsiness so be a little careful with this. ?Increase your water intake as this can help get rid of some of these mucus secretions and draining ?Use salt water gargles to clear out mucus from the back of your throat ?I would try your inhaler before bedtime as this can open the air sacs and help get out some of the mucus as well ?At a subsequent visit with Dr. Redmond School, I recommend a baseline spirometry breathing test to check your lung volumes since there was risk finding of emphysema on your CT scan ?If you do not feel much improved by Monday or Tuesday, then let me know  ?

## 2021-12-01 IMAGING — CT CT CHEST W/ CM
3 of 5 series · 12 of 46 positions shown, 17 images · IV contrast (iopamidol)
Comparison: PET-CT 09/20/2019

CLINICAL DATA: Esophageal cancer, restaging.

EXAM:
CT CHEST AND ABDOMEN WITH CONTRAST
TECHNIQUE: Multidetector CT imaging of the chest and abdomen was performed
following the standard protocol during bolus administration of
intravenous contrast.
CONTRAST:  100mL [id number] IOPAMIDOL ([id number]) INJECTION 61%

[Series 3: cap with 5.00 br40 s3 axial · axial · 0.76mm/px · z∈[+1316,+1711]mm · 8 of 103 slices shown, 13 images]
[im 12/103  soft-tissue]
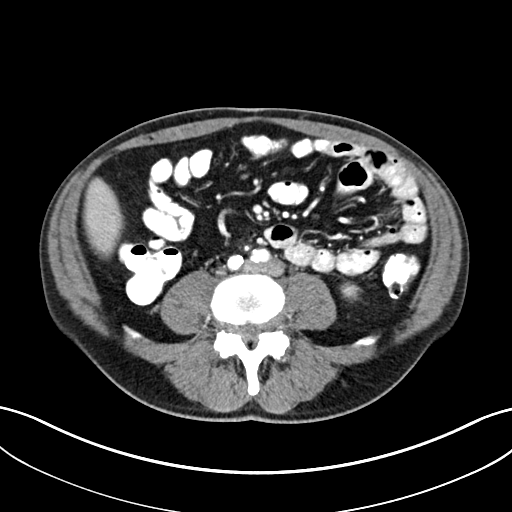
[im 12/103  bone]
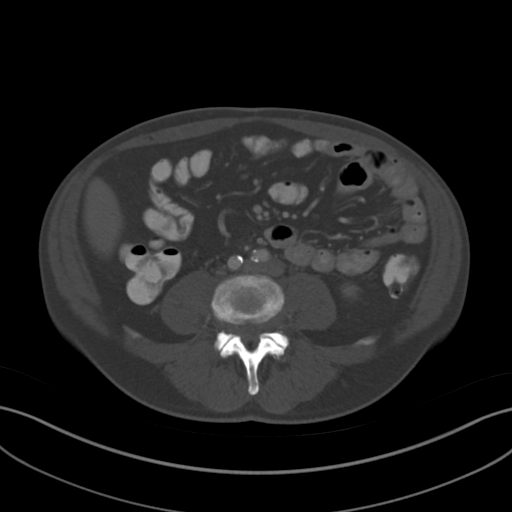
[im 23/103  soft-tissue]
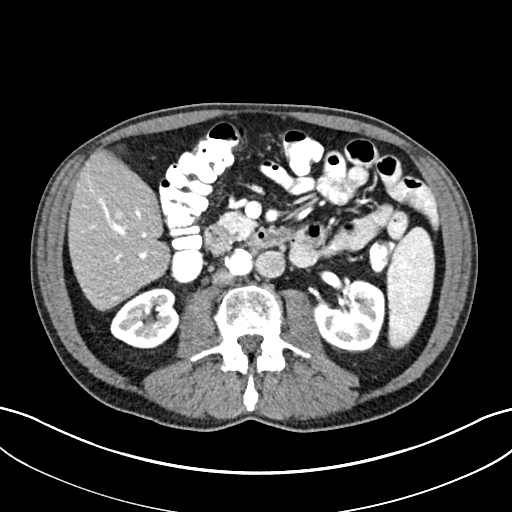
[im 35/103  soft-tissue]
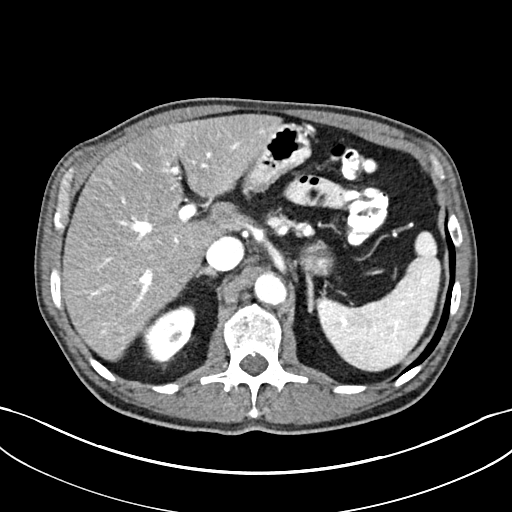
[im 46/103  soft-tissue]
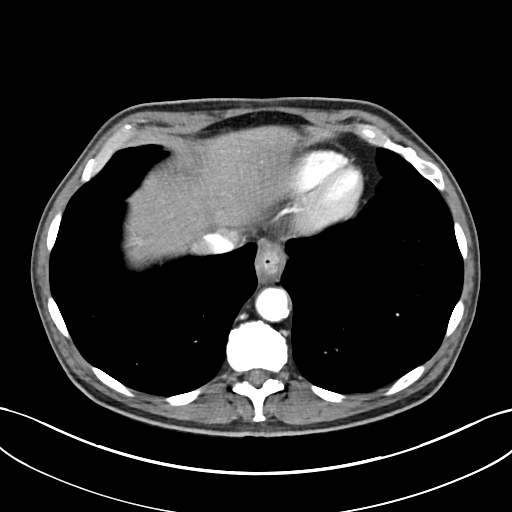
[im 57/103  soft-tissue]
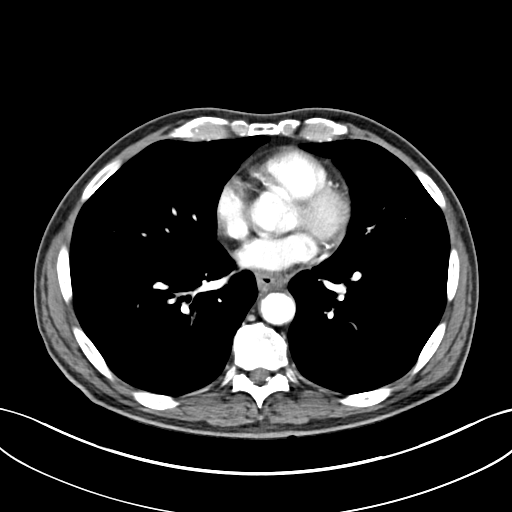
[im 57/103  lung]
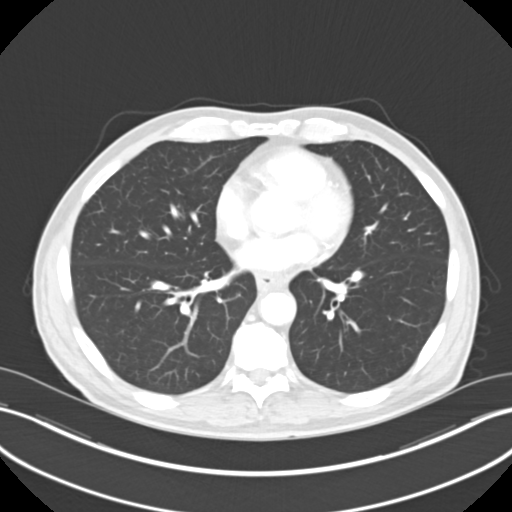
[im 69/103  soft-tissue]
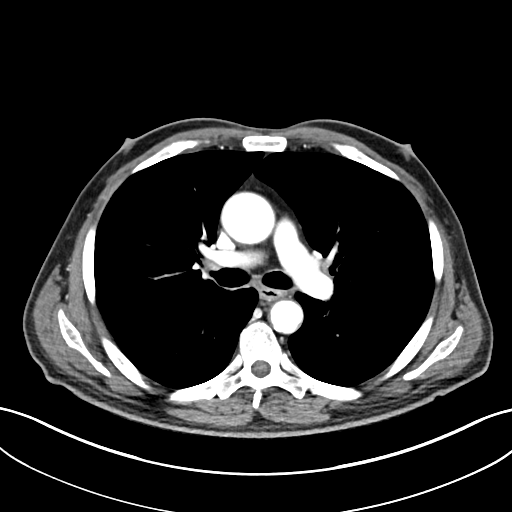
[im 69/103  lung]
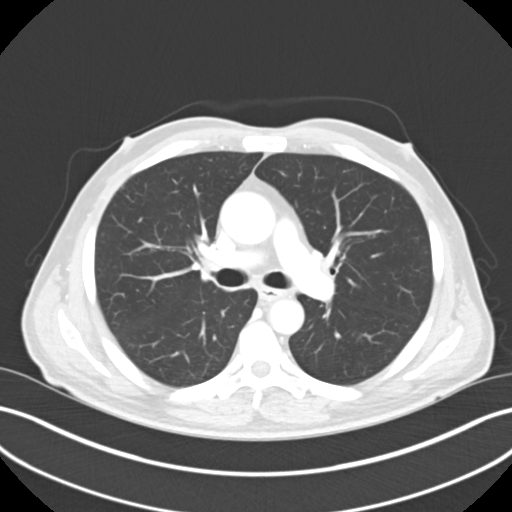
[im 80/103  soft-tissue]
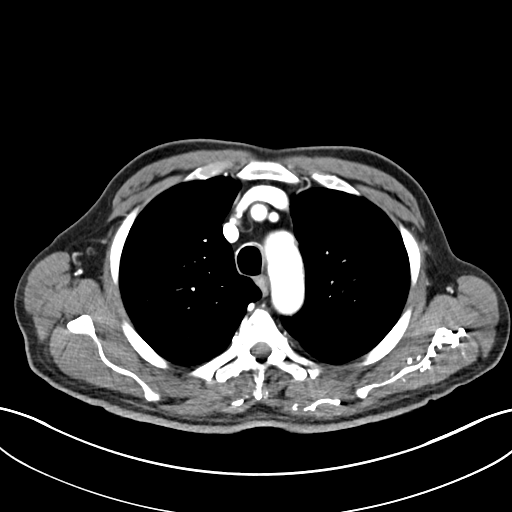
[im 80/103  lung]
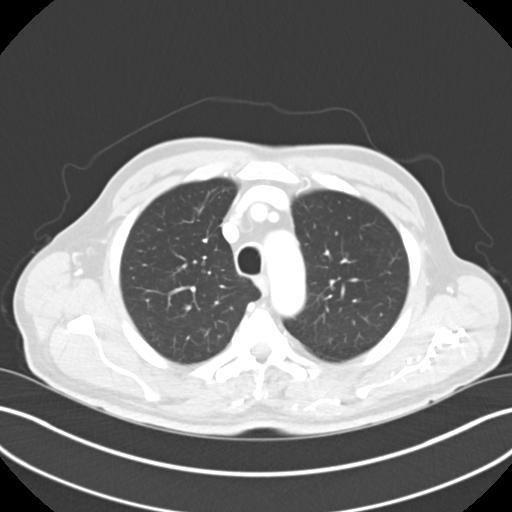
[im 91/103  soft-tissue]
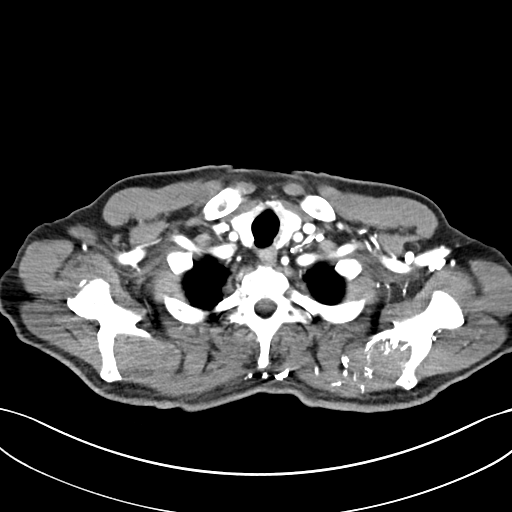
[im 91/103  lung]
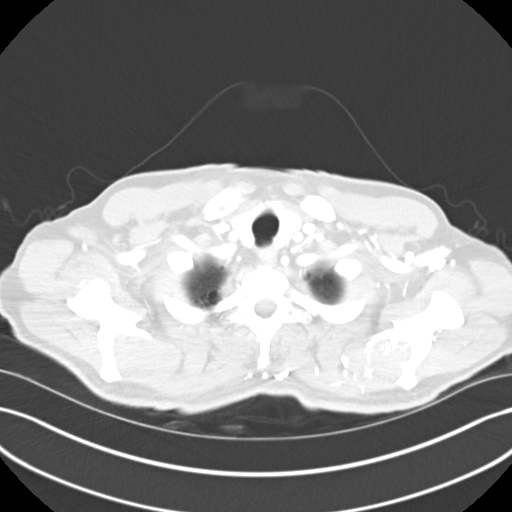

[Series 7: cap with 2.00 br40 s3 cor · coronal · 0.76mm/px · 3 of 189 slices shown]
[im 63/189  soft-tissue]
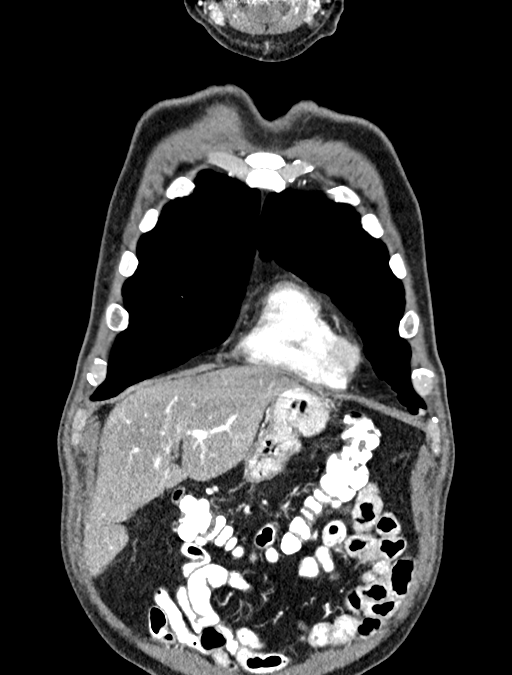
[im 84/189  soft-tissue]
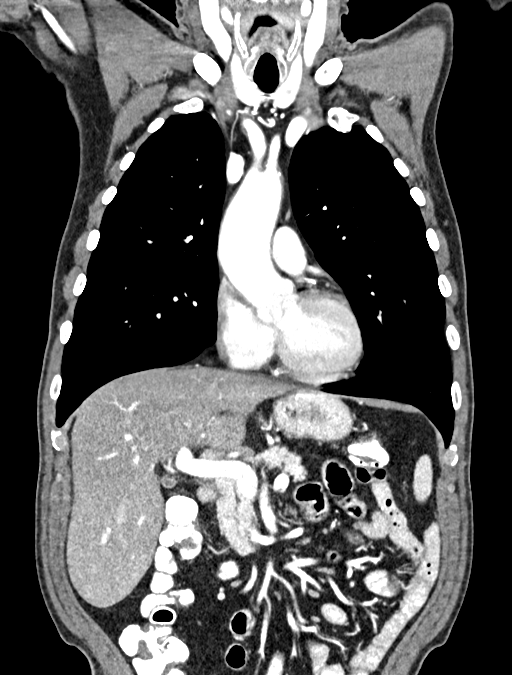
[im 105/189  soft-tissue]
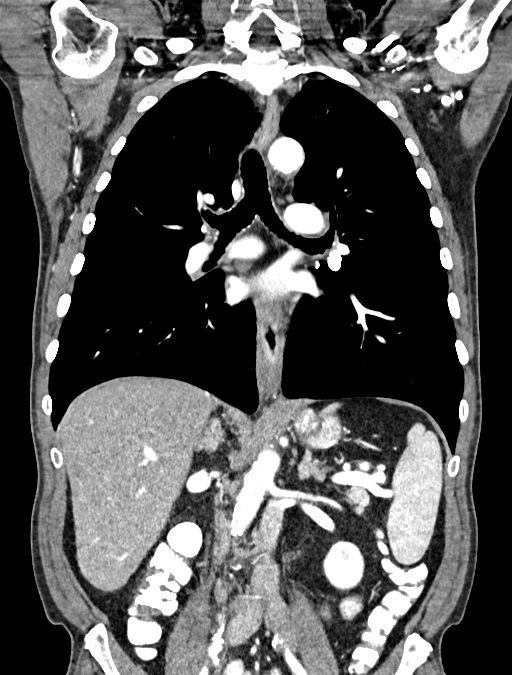

[Series 9: cap with 2.00 br40 s3 sag · sagittal · 0.76mm/px · 1 of 195 slices shown]
[im 65/195  soft-tissue]
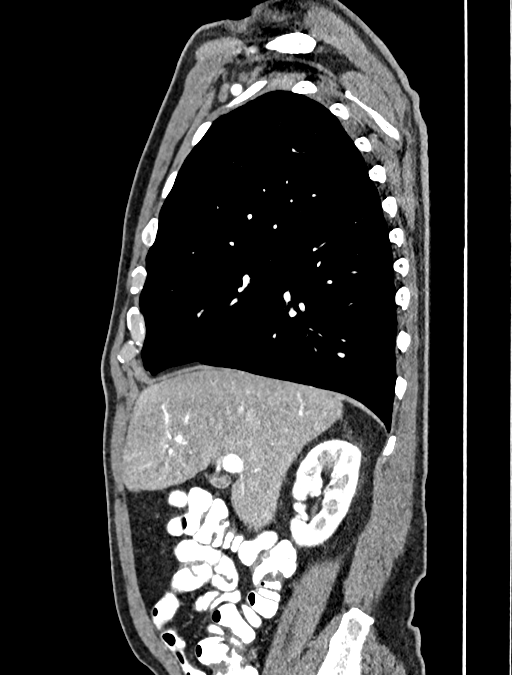

[12 of 46 positions shown; findings below may reference images not displayed]

FINDINGS: CT CHEST FINDINGS

Cardiovascular: The heart size appears normal. Aortic
atherosclerosis. Left main, lad, left circumflex and RCA coronary
artery calcifications.

Mediastinum/Nodes: Normal appearance of the thyroid gland. The
trachea appears patent and is midline. Distal esophageal wall
thickening has improved from previous exam. On today's exam the
distal esophagus underlying lesion measures 2.5 x 2.3 by 4.7 cm
(volume = 14 cm^3), image 101/9 and image 57/3. On the previous
exam using the same measuring scheme this measured 3.2 x 3.2 by
cm (volume = 30 cm^3). No enlarged supraclavicular, axillary,
mediastinal, or hilar lymph nodes. Calcified left hilar lymph nodes
identified compatible with prior granulomatous disease.

Lungs/Pleura: No pleural effusion identified. Paraseptal and
centrilobular emphysema. No airspace consolidation, atelectasis or
pneumothorax. Calcified granulomas identified within the
anterolateral left lower lobe. No suspicious pulmonary nodule or
mass identified.

Musculoskeletal: No chest wall mass or suspicious bone lesions
identified.

CT ABDOMEN FINDINGS

Hepatobiliary: No focal liver abnormality is seen. No gallstones,
gallbladder wall thickening, or biliary dilatation.

Pancreas: Unremarkable. No pancreatic ductal dilatation or
surrounding inflammatory changes.

Spleen: Normal in size without focal abnormality.

Adrenals/Urinary Tract: Normal appearance of the adrenal glands.
Low-density structure within lateral cortex of right kidney measures
5 mm and is too small to reliably characterize. No hydronephrosis
identified bilaterally.

Stomach/Bowel: The stomach appears nondistended. No bowel wall
thickening, inflammation or distension. Left-sided colonic
diverticula noted.

Vascular/Lymphatic: Aortic atherosclerosis. Similar appearance of
upper limits of normal right retro peritoneal lymph nodes. No
adenopathy.

Other: No free fluid or fluid collections.

Musculoskeletal: Spondylosis identified.
IMPRESSION: 1. Interval response to therapy. Approximately 50% reduction in size
of distal esophageal mass
2. No new or progressive disease identified within the chest or
abdomen.
3. Emphysema and aortic atherosclerosis.
4. Left main and 3 vessel coronary artery calcifications.

Aortic Atherosclerosis (XJ49K-J69.9) and Emphysema (XJ49K-0IO.0).

## 2021-12-07 ENCOUNTER — Other Ambulatory Visit: Payer: Self-pay

## 2021-12-07 ENCOUNTER — Inpatient Hospital Stay: Payer: PPO | Admitting: Hematology

## 2021-12-07 ENCOUNTER — Inpatient Hospital Stay: Payer: PPO | Attending: Hematology

## 2021-12-07 ENCOUNTER — Encounter: Payer: Self-pay | Admitting: Hematology

## 2021-12-07 VITALS — BP 135/73 | HR 56 | Temp 98.0°F | Resp 18 | Ht 66.5 in | Wt 158.1 lb

## 2021-12-07 DIAGNOSIS — R071 Chest pain on breathing: Secondary | ICD-10-CM | POA: Insufficient documentation

## 2021-12-07 DIAGNOSIS — Z87891 Personal history of nicotine dependence: Secondary | ICD-10-CM | POA: Insufficient documentation

## 2021-12-07 DIAGNOSIS — I10 Essential (primary) hypertension: Secondary | ICD-10-CM | POA: Diagnosis not present

## 2021-12-07 DIAGNOSIS — Z923 Personal history of irradiation: Secondary | ICD-10-CM | POA: Diagnosis not present

## 2021-12-07 DIAGNOSIS — Z9221 Personal history of antineoplastic chemotherapy: Secondary | ICD-10-CM | POA: Diagnosis not present

## 2021-12-07 DIAGNOSIS — K219 Gastro-esophageal reflux disease without esophagitis: Secondary | ICD-10-CM | POA: Diagnosis not present

## 2021-12-07 DIAGNOSIS — C155 Malignant neoplasm of lower third of esophagus: Secondary | ICD-10-CM | POA: Diagnosis not present

## 2021-12-07 DIAGNOSIS — R970 Elevated carcinoembryonic antigen [CEA]: Secondary | ICD-10-CM | POA: Diagnosis not present

## 2021-12-07 DIAGNOSIS — E119 Type 2 diabetes mellitus without complications: Secondary | ICD-10-CM | POA: Insufficient documentation

## 2021-12-07 DIAGNOSIS — Z8501 Personal history of malignant neoplasm of esophagus: Secondary | ICD-10-CM | POA: Insufficient documentation

## 2021-12-07 DIAGNOSIS — E785 Hyperlipidemia, unspecified: Secondary | ICD-10-CM | POA: Insufficient documentation

## 2021-12-07 DIAGNOSIS — F1021 Alcohol dependence, in remission: Secondary | ICD-10-CM | POA: Diagnosis not present

## 2021-12-07 LAB — CBC WITH DIFFERENTIAL (CANCER CENTER ONLY)
Abs Immature Granulocytes: 0.01 10*3/uL (ref 0.00–0.07)
Basophils Absolute: 0 10*3/uL (ref 0.0–0.1)
Basophils Relative: 1 %
Eosinophils Absolute: 0.2 10*3/uL (ref 0.0–0.5)
Eosinophils Relative: 4 %
HCT: 36.3 % — ABNORMAL LOW (ref 39.0–52.0)
Hemoglobin: 12.6 g/dL — ABNORMAL LOW (ref 13.0–17.0)
Immature Granulocytes: 0 %
Lymphocytes Relative: 24 %
Lymphs Abs: 1.2 10*3/uL (ref 0.7–4.0)
MCH: 32.5 pg (ref 26.0–34.0)
MCHC: 34.7 g/dL (ref 30.0–36.0)
MCV: 93.6 fL (ref 80.0–100.0)
Monocytes Absolute: 0.4 10*3/uL (ref 0.1–1.0)
Monocytes Relative: 9 %
Neutro Abs: 3.2 10*3/uL (ref 1.7–7.7)
Neutrophils Relative %: 62 %
Platelet Count: 195 10*3/uL (ref 150–400)
RBC: 3.88 MIL/uL — ABNORMAL LOW (ref 4.22–5.81)
RDW: 13 % (ref 11.5–15.5)
WBC Count: 5.1 10*3/uL (ref 4.0–10.5)
nRBC: 0 % (ref 0.0–0.2)

## 2021-12-07 LAB — CEA (IN HOUSE-CHCC): CEA (CHCC-In House): 19.85 ng/mL — ABNORMAL HIGH (ref 0.00–5.00)

## 2021-12-07 LAB — CMP (CANCER CENTER ONLY)
ALT: 29 U/L (ref 0–44)
AST: 29 U/L (ref 15–41)
Albumin: 4.5 g/dL (ref 3.5–5.0)
Alkaline Phosphatase: 54 U/L (ref 38–126)
Anion gap: 5 (ref 5–15)
BUN: 13 mg/dL (ref 8–23)
CO2: 29 mmol/L (ref 22–32)
Calcium: 9.4 mg/dL (ref 8.9–10.3)
Chloride: 104 mmol/L (ref 98–111)
Creatinine: 1.09 mg/dL (ref 0.61–1.24)
GFR, Estimated: >60 mL/min (ref >60)
Glucose, Bld: 139 mg/dL — ABNORMAL HIGH (ref 70–99)
Potassium: 4.5 mmol/L (ref 3.5–5.1)
Sodium: 138 mmol/L (ref 135–145)
Total Bilirubin: 0.8 mg/dL (ref 0.3–1.2)
Total Protein: 7.6 g/dL (ref 6.5–8.1)

## 2021-12-07 NOTE — Progress Notes (Signed)
[place]   [phone] [phone]   Clinic Follow up Note   Patient Care Team: Denita Lung, MD as PCP - General (Family Medicine) [name], MD as Consulting Physician (Gastroenterology) [name], MD as Consulting Physician (Hematology) Minus Breeding, MD as Consulting Physician (Cardiology)  Date of Service:  12/07/2021  CHIEF COMPLAINT: f/u of esophageal cancer  CURRENT THERAPY:  Surveillance  ASSESSMENT & PLAN:  James Dunn is a 71 y.o. male with   1. Adenocarcinoma of distal esophagus, T3N2M0, stage IVA, ypT0N0  -His 08/30/19 EGD showed a large, fungating, partially circumferential mass in the lower third of the esophagus causing dysphagia. His PET from 09/20/19 showed no evidence of metastatic disease.  -His EUS from 09/13/19 which showed locally advanced adenocarcinoma invading the muscular layer with 2 enlarged LNs, T3N2M0. -He completed standard neoadjuvant concurrent ChemoRT with weekly Carboplatin and Taxol for 6 weeks 09/30/19-11/06/19.  -He proceeded with surgical resection by Dr Servando Snare on 01/01/20. Surgical path showed complete response to neoadjuvant chemoRT with no residual disease in surgery. Adjuvant treatment not recommended -He is currently on surveillance. Most recent CT CAP on 08/05/21 showed NED. CEA from the same day was overall stable at 9.54. -He is clinically doing well. Labs reviewed, CBC and CMP overall stable. There is no clinical concern for recurrence. He does report some chest pain/tightness but only when taking a deep breath. -He is now over 2 years since his diagnosis. Will continue surveillance. We will monitor his chest pain and plan to repeat CT in 4 months. I explained this is could be from his recent URI (see notes from PCP 4/14 and 10/21/21), but if it worsens, I advised him to let us know.   2. H/o Tobacco and alcohol use -He has long term tobacco abuse, quit in 03/2019.  -He noted he has cut out alcohol  in 09/2019, still drinks some time. I recommend he continue cessation.   3 HTN, HL, DM, GERD  -Continue medications and f/u with PCP.      PLAN: -f/u in 4 months, with lab and CT several days before   No problem-specific Assessment & Plan notes found for this encounter.   SUMMARY OF ONCOLOGIC HISTORY: Oncology History Overview Note  Cancer Staging Esophageal cancer [facility]) Staging form: Esophagus - Adenocarcinoma, AJCC 8th Edition - Clinical stage from 09/13/2019: Stage IVA (cT3, cN2, cM0) - Signed by [name], MD on 09/22/2019 - Pathologic stage from 01/07/2020: Stage I (ypT0, pN0, cM0, G2) - Signed by [name], MD on 01/07/2020    Esophageal cancer ([place])  08/30/2019 Procedure   EGD by Dr. [name] 08/30/19  IMPRESSION - Partially obstructing, malignant esophageal tumor was found in the lower third of the esophagus. Biopsied. Injected. - Normal stomach. - Normal examined duodenum.   08/30/2019 Initial Biopsy   FINAL MICROSCOPIC DIAGNOSIS:   A. ESOPHAGUS, BIOPSY:  - At least intramucosal adenocarcinoma.  - See comment.   COMMENT:   - The depth of invasion can not be determined due to the superficial  nature of the biopsy.  Dr.  Vic Ripper has reviewed the case and concurs  with this interpretation.  Additional studies can be performed upon  clinician request.    09/05/2019 Imaging   CT CAP W Contrast 09/05/19  IMPRESSION: 1. Soft tissue mass noted distal esophagus, compatible with known neoplasm. There is a small 6 mm short axis adjacent paraesophageal node, concerning for metastatic disease. 2. Upper normal to mildly enlarged hepatoduodenal  ligament lymph node, with upper normal right para-aortic nodes. As metastatic disease cannot be excluded, PET-CT may prove helpful to further evaluate. 3. 1.9 x 1.3 cm nodular collection of soft tissue posterior to the descending duodenum. This is in close proximity to the pancreatic head but a definite communication of parenchyma  between these 2 structures is not discernible by CT. 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 Attention on follow-up recommended. 4. 6 mm perifissural nodule right middle lobe, likely a subpleural lymph node. Attention on follow-up recommended. 5. Hepatomegaly with hepatic steatosis. 6. Ascending thoracic aorta measures 4 cm diameter. Recommend annual imaging followup by CTA or MRA. This recommendation follows 2010 ACCF/AHA/AATS/ACR/ASA/SCA/SCAI/SIR/STS/SVM Guidelines for the Diagnosis and Management of Patients with Thoracic Aortic Disease. Circulation. 2010; 121: I948-N462. Aortic aneurysm NOS (ICD10-I71.9) 7. Left-sided IVC, normal variant.   09/11/2019 Tumor Marker   Baseline CEA 25.12 on 09/11/19   09/13/2019 Cancer Staging   Staging form: Esophagus - Adenocarcinoma, AJCC 8th Edition - Clinical stage from 09/13/2019: Stage IVA (cT3, cN2, cM0) - Signed by [name], MD on 09/22/2019   09/13/2019 Procedure   EUS by Dr. [name] 09/13/19  IMPRESSION - A mass was found in the lower third of the esophagus. A tissue diagnosis was obtained prior to this exam. This is of adenocarcinoma. This was staged T3 N3 Mx by endosonographic criteria. - No specimens collected.   09/20/2019 PET scan   PET IMPRESSION: 1. Hypermetabolic distal esophageal primary. 2. Low-level hypermetabolism within upper abdominal nodes. These are technically nonspecific, but favored to be reactive in the setting of hepatic steatosis and hepatomegaly. 3. Otherwise, no evidence of metastatic disease. 4. Anal hypermetabolism could be physiologic. Consider correlation with physical exam and colonoscopy, if patient is not up-to-date. 5. Incidental findings, including: Aortic atherosclerosis (ICD10-I70.0), coronary artery atherosclerosis and emphysema (ICD10-J43.9). Sinus disease and bilateral mastoid effusions.   09/22/2019 Initial Diagnosis   Esophageal cancer ([place])   09/30/2019 - 11/04/2019  Chemotherapy   concurrent ChemoRT with weekly Carboplatin and Taxol starting 09/30/19. Last dose was Taxol alone given neutropenia.    09/30/2019 - 11/06/2019 Radiation Therapy   concurrent ChemoRT with Dr. Lisbeth Renshaw starting 09/30/19-11/06/19   12/05/2019 Imaging   CT CAP w Contrast  IMPRESSION: 1. Interval response to therapy. Approximately 50% reduction in size of distal esophageal mass 2. No new or progressive disease identified within the chest or abdomen. 3. Emphysema and aortic atherosclerosis. 4. Left main and 3 vessel coronary artery calcifications.   Aortic Atherosclerosis (ICD10-I70.0) and Emphysema (ICD10-J43.9).     01/01/2020 Surgery   VIDEO BRONCHOSCOPY WITH BRONCHIAL WASHING FOR CULTURES AND GRAM STAIN and TRANSHIATAL TOTAL ESOPHAGECTOMY COMPLETE WITH NIMS TUBE and JEJUNOSTOMY and CHEST TUBE INSERTION with Dr Servando Snare.    01/01/2020 Pathology Results   FINAL MICROSCOPIC DIAGNOSIS:   A. ESOPHAGUS AND PARTIAL STOMACH, ESOPHAGECTOMY:  - No residual carcinoma identified.  - Reactive changes, mixed inflammation, and ulcer.  - Sixteen of sixteen lymph nodes negative for carcinoma (0/16).  - See oncology table.    01/07/2020 Cancer Staging   Staging form: Esophagus - Adenocarcinoma, AJCC 8th Edition - Pathologic stage from 01/07/2020: Stage I (ypT0, pN0, cM0, G2) - Signed by [name], MD on 01/07/2020   08/05/2020 Imaging   CT CAP  IMPRESSION: 1. No evidence of thoracic metastasis. 2. Gastric pull-up anatomy without evidence of local recurrence. 3. No evidence of metastatic disease in the abdomen pelvis. 4. Moderate volume stool throughout the colon. 5. Aortic Atherosclerosis (ICD10-I70.0).  05/28/2021 Procedure   Upper GI Endoscopy, Dr. [name]  Impression: - Benign-appearing esophageal stenosis. Dilated. - A previous surgical anastomosis was found, characterized by healthy appearing mucosa. - Normal examined duodenum. - No specimens collected.   08/05/2021  Imaging   EXAM: CT CHEST, ABDOMEN, AND PELVIS WITH CONTRAST  IMPRESSION: 1. Stable postsurgical changes of prior gastric pull-through without new or progressive findings to suggest recurrent or metastatic disease within the chest, abdomen, or pelvis. 2. Hepatic steatosis. 3. Colonic diverticulosis without findings of acute diverticulitis. 4. Aortic Atherosclerosis (ICD10-I70.0) and Emphysema (ICD10-J43.9).      INTERVAL HISTORY:  James Dunn is here for a follow up of esophageal cancer. He was last seen by me on 08/09/21. He presents to the clinic alone. He reports he is doing well overall, playing plenty of golf. His only complaint is some chest pain/tightness only when he takes a deep breath. He explains this has been going on for about the last week.   All other systems were reviewed with the patient and are negative.  MEDICAL HISTORY:  Past Medical History:  Diagnosis Date   Allergy    Asthma    Controlled type 2 diabetes mellitus without complication, without long-term current use of insulin ([place]) 06/25/2012   Diverticulosis    ED (erectile dysfunction) 12/28/2010   Esophageal cancer ([place]) 09/22/2019   Former smoker 06/25/2012   GERD (gastroesophageal reflux disease) 2002   History of colonic polyps 05/11/2016   Hyperlipidemia associated with type 2 diabetes mellitus ([place]) 05/11/2016   Hypertension associated with diabetes ([place]) 08/02/2017   denies   Mild intermittent asthma without complication 9/37/9024   Seasonal allergic rhinitis due to pollen 11/29/2016   Thoracic aortic aneurysm ([facility])    Ascending thoracic aorta measures 4 cm diameter   Tubular adenoma of colon 12/05/2016    SURGICAL HISTORY: Past Surgical History:  Procedure Laterality Date   BALLOON DILATION N/A 03/20/2020   Procedure: BALLOON DILATION;  Surgeon: [name], MD;  Location: [facility];  Service: Endoscopy;  Laterality: N/A;   BALLOON DILATION N/A 04/03/2020   Procedure: BALLOON DILATION;   Surgeon: [name], MD;  [facility]: [facility];  Service: Endoscopy;  Laterality: N/A;   BALLOON DILATION N/A 04/17/2020   Procedure: BALLOON DILATION;  Surgeon: [name], MD;  [facility]: [facility];  Service: Endoscopy;  Laterality: N/A;   BALLOON DILATION N/A 05/01/2020   Procedure: BALLOON DILATION;  Surgeon: [name], MD;  [facility];  Service: Endoscopy;  Laterality: N/A;   BALLOON DILATION N/A 05/22/2020   Procedure: BALLOON DILATION;  Surgeon: [name], MD;  [facility];  Service: Endoscopy;  Laterality: N/A;   BALLOON DILATION N/A 06/05/2020   Procedure: BALLOON DILATION;  Surgeon: [name], MD;  [facility];  Service: Endoscopy;  Laterality: N/A;   BALLOON DILATION N/A 07/10/2020   Procedure: BALLOON DILATION;  Surgeon: [name], MD;  [facility];  Service: Endoscopy;  Laterality: N/A;   BALLOON DILATION N/A 09/24/2020   Procedure: BALLOON DILATION;  Surgeon: [name], MD;  [facility];  Service: Endoscopy;  Laterality: N/A;   BALLOON DILATION N/A 05/28/2021   Procedure: BALLOON DILATION;  Surgeon: [name], MD;  [facility];  Service: Endoscopy;  Laterality: N/A;   BIOPSY  08/30/2019   Procedure: BIOPSY;  Surgeon: [name], MD;  [facility];  Service: Endoscopy;;   CHEST TUBE INSERTION Left 01/01/2020   Procedure: CHEST TUBE INSERTION;  Surgeon: [name], MD;  Location: [facility];  Service: Thoracic;  Laterality: Left;   COMPLETE ESOPHAGECTOMY N/A 01/01/2020   Procedure: TRANSHIATAL TOTAL ESOPHAGECTOMY COMPLETE WITH NIMS TUBE;  Surgeon: [name], MD;  Location: [place] OR;  Service: Thoracic;  Laterality: N/A;   ESOPHAGOGASTRODUODENOSCOPY (EGD) WITH PROPOFOL N/A 08/30/2019   Procedure: ESOPHAGOGASTRODUODENOSCOPY (EGD) WITH PROPOFOL;  Surgeon: [name], MD;  [facility];  Service: Endoscopy;  Laterality: N/A;    ESOPHAGOGASTRODUODENOSCOPY (EGD) WITH PROPOFOL N/A 09/13/2019   Procedure: ESOPHAGOGASTRODUODENOSCOPY (EGD) WITH PROPOFOL;  Surgeon: [name], MD;  [facility];  Service: Endoscopy;  Laterality: N/A;   ESOPHAGOGASTRODUODENOSCOPY (EGD) WITH PROPOFOL N/A 03/20/2020   Procedure: ESOPHAGOGASTRODUODENOSCOPY (EGD) WITH PROPOFOL;  Surgeon: [name], MD;  [facility];  Service: Endoscopy;  Laterality: N/A;   ESOPHAGOGASTRODUODENOSCOPY (EGD) WITH PROPOFOL N/A 04/03/2020   Procedure: ESOPHAGOGASTRODUODENOSCOPY (EGD) WITH PROPOFOL;  Surgeon: [name], MD;  [facility];  Service: Endoscopy;  Laterality: N/A;   ESOPHAGOGASTRODUODENOSCOPY (EGD) WITH PROPOFOL N/A 04/17/2020   Procedure: ESOPHAGOGASTRODUODENOSCOPY (EGD) WITH PROPOFOL;  Surgeon: [name], MD;  [facility];  Service: Endoscopy;  Laterality: N/A;   ESOPHAGOGASTRODUODENOSCOPY (EGD) WITH PROPOFOL N/A 05/01/2020   Procedure: ESOPHAGOGASTRODUODENOSCOPY (EGD) WITH PROPOFOL;  Surgeon: [name], MD;  [facility];  Service: Endoscopy;  Laterality: N/A;   ESOPHAGOGASTRODUODENOSCOPY (EGD) WITH PROPOFOL N/A 05/22/2020   Procedure: ESOPHAGOGASTRODUODENOSCOPY (EGD) WITH PROPOFOL;  Surgeon: [name], MD;  [facility];  Service: Endoscopy;  Laterality: N/A;   ESOPHAGOGASTRODUODENOSCOPY (EGD) WITH PROPOFOL N/A 06/05/2020   Procedure: ESOPHAGOGASTRODUODENOSCOPY (EGD) WITH PROPOFOL;  Surgeon: [name], MD;  [facility];  Service: Endoscopy;  Laterality: N/A;   ESOPHAGOGASTRODUODENOSCOPY (EGD) WITH PROPOFOL N/A 07/10/2020   Procedure: ESOPHAGOGASTRODUODENOSCOPY (EGD) WITH PROPOFOL;  Surgeon: [name], MD;  [facility];  Service: Endoscopy;  Laterality: N/A;   ESOPHAGOGASTRODUODENOSCOPY (EGD) WITH PROPOFOL N/A 09/24/2020   Procedure: ESOPHAGOGASTRODUODENOSCOPY (EGD) WITH PROPOFOL;  Surgeon: [name], MD;  [facility];  Service:  Endoscopy;  Laterality: N/A;   ESOPHAGOGASTRODUODENOSCOPY (EGD) WITH PROPOFOL N/A 05/28/2021   Procedure: ESOPHAGOGASTRODUODENOSCOPY (EGD) WITH PROPOFOL;  Surgeon: [name], MD;  [facility];  Service: Endoscopy;  Laterality: N/A;   JEJUNOSTOMY N/A 01/01/2020   Procedure: JEJUNOSTOMY;  Surgeon: [name], MD;  Location: [facility] OR;  Service: Thoracic;  Laterality: N/A;   shave  Left 09/27/2021   macular seborrheic keratosis, pigmented   SKIN BIOPSY N/A 03/04/2019   upper back Nerofibroma and follicular cyst    SUBMUCOSAL INJECTION  08/30/2019   Procedure: SUBMUCOSAL INJECTION;  Surgeon: [name], MD;  [facility];  Service: Endoscopy;;   UPPER ESOPHAGEAL ENDOSCOPIC ULTRASOUND (EUS) N/A 09/13/2019   Procedure: UPPER ESOPHAGEAL ENDOSCOPIC ULTRASOUND (EUS);  Surgeon: [name], MD;  Location: [facility];  Service: Endoscopy;  Laterality: N/A;   VIDEO BRONCHOSCOPY N/A 01/01/2020   Procedure: VIDEO BRONCHOSCOPY WITH BRONCHIAL WASHING FOR CULTURES AND GRAM STAIN;  Surgeon: [name], MD;  Location: [place];  Service: Thoracic;  Laterality: N/A;    I have reviewed the social history and family history with the patient and they are unchanged from previous note.  ALLERGIES:  has No Known Allergies.  MEDICATIONS:  Current Outpatient Medications  Medication Sig Dispense Refill   albuterol (VENTOLIN HFA) 108 (90 Base) MCG/ACT inhaler Inhale 1 puff into the lungs every 6 (six) hours as needed for wheezing or shortness of breath.     atorvastatin (LIPITOR) 20 MG tablet Take 1 tablet (20 mg total) by mouth  daily. 90 tablet 3   Azelaic Acid 15 % gel Apply 1 application topically daily as needed (Rosacea). (Patient not taking: Reported on 10/21/2021)     esomeprazole (NEXIUM) 40 MG capsule TAKE 1 CAPSULE BY MOUTH EVERY DAY FOR 90 DAYS 90 capsule 1   metFORMIN (GLUCOPHAGE) 500 MG tablet TAKE 1 TABLET BY MOUTH 2 TIMES DAILY WITH A MEAL. 180 tablet 1   Misc  Natural Products (ESSIAC TONIC) LIQD Take 2 oz by mouth daily. Herbal Tea     Multiple Vitamins-Minerals (MULTIVITAMIN WITH MINERALS) tablet Take 1 tablet by mouth daily.     No current facility-administered medications for this visit.    PHYSICAL EXAMINATION: ECOG PERFORMANCE STATUS: 1 - Symptomatic but completely ambulatory  Vitals:   12/07/21 0836  BP: 135/73  Pulse: (!) 56  Resp: 18  Temp: 98 F (36.7 C)  SpO2: 100%   Wt Readings from Last 3 Encounters:  12/07/21 158 lb 1.6 oz (71.7 kg)  10/21/21 156 lb 3.2 oz (70.9 kg)  10/01/21 154 lb (69.9 kg)     GENERAL:alert, no distress and comfortable SKIN: skin color, texture, turgor are normal, no rashes or significant lesions EYES: normal, Conjunctiva are pink and non-injected, sclera clear  NECK: supple, thyroid normal size, non-tender, without nodularity LYMPH:  no palpable lymphadenopathy in the cervical, axillary  LUNGS: clear to auscultation and percussion with normal breathing effort HEART: regular rate & rhythm and no murmurs and no lower extremity edema ABDOMEN:abdomen soft, non-tender and normal bowel sounds Musculoskeletal:no cyanosis of digits and no clubbing  NEURO: alert & oriented x 3 with fluent speech, no focal motor/sensory deficits  LABORATORY DATA:  I have reviewed the data as listed    Latest Ref Rng & Units 12/07/2021    8:13 AM 08/05/2021    8:14 AM 05/10/2021    9:53 AM  CBC  WBC 4.0 - 10.5 K/uL 5.1  4.8  6.0   Hemoglobin 13.0 - 17.0 g/dL 12.6  11.9  12.9   Hematocrit 39.0 - 52.0 % 36.3  34.8  38.0   Platelets 150 - 400 K/uL 195  189  174         Latest Ref Rng & Units 12/07/2021    8:13 AM 08/05/2021    8:14 AM 05/10/2021    9:53 AM  CMP  Glucose 70 - 99 mg/dL 139  127  127   BUN 8 - 23 mg/dL '13  10  10   '$ Creatinine 0.61 - 1.24 mg/dL 1.09  0.94  1.07   Sodium 135 - 145 mmol/L 138  138  141   Potassium 3.5 - 5.1 mmol/L 4.5  4.2  4.5   Chloride 98 - 111 mmol/L 104  103  106   CO2 22 - 32  mmol/L '29  28  28   '$ Calcium 8.9 - 10.3 mg/dL 9.4  9.1  9.4   Total Protein 6.5 - 8.1 g/dL 7.6  6.9  7.6   Total Bilirubin 0.3 - 1.2 mg/dL 0.8  0.5  0.5   Alkaline Phos 38 - 126 U/L 54  49  59   AST 15 - 41 U/L 29  20  33   ALT 0 - 44 U/L 29  19  33       RADIOGRAPHIC STUDIES: I have personally reviewed the radiological images as listed and agreed with the findings in the report. No results found.    Orders Placed This Encounter  Procedures   CT CHEST  ABDOMEN PELVIS W CONTRAST    Standing Status:   Future    Standing Expiration Date:   12/08/2022    Order Specific Question:   Preferred imaging location?    Answer:   [facility]    Order Specific Question:   Is Oral Contrast requested for this exam?    Answer:   Yes, Per Radiology protocol   All questions were answered. The patient knows to call the clinic with any problems, questions or concerns. No barriers to learning was detected. The total time spent in the appointment was 30 minutes.     [name], MD 12/07/2021   I, Wilburn Mylar, am acting as scribe for [name], MD.   I have reviewed the above documentation for accuracy and completeness, and I agree with the above.

## 2021-12-09 ENCOUNTER — Other Ambulatory Visit: Payer: Self-pay

## 2021-12-09 DIAGNOSIS — C155 Malignant neoplasm of lower third of esophagus: Secondary | ICD-10-CM

## 2021-12-16 ENCOUNTER — Ambulatory Visit (INDEPENDENT_AMBULATORY_CARE_PROVIDER_SITE_OTHER): Payer: PPO | Admitting: Medical

## 2021-12-16 VITALS — BP 110/60 | HR 68 | Temp 97.2°F | Resp 16 | Wt 156.4 lb

## 2021-12-16 DIAGNOSIS — Z8501 Personal history of malignant neoplasm of esophagus: Secondary | ICD-10-CM

## 2021-12-16 DIAGNOSIS — R0789 Other chest pain: Secondary | ICD-10-CM | POA: Diagnosis not present

## 2021-12-16 DIAGNOSIS — Z9889 Other specified postprocedural states: Secondary | ICD-10-CM | POA: Diagnosis not present

## 2021-12-16 DIAGNOSIS — R059 Cough, unspecified: Secondary | ICD-10-CM

## 2021-12-16 DIAGNOSIS — J4520 Mild intermittent asthma, uncomplicated: Secondary | ICD-10-CM

## 2021-12-16 DIAGNOSIS — Z9049 Acquired absence of other specified parts of digestive tract: Secondary | ICD-10-CM | POA: Diagnosis not present

## 2021-12-16 NOTE — Progress Notes (Signed)
Subjective:  James Dunn is a 71 y.o. male who presents for Chief Complaint  Patient presents with   pain when taking deep breath    Pain when taking deep breath behind chest bone, been going on a couple weeks     Here for some chest discomfort.  For the last few weeks he feels some discomfort behind the sternum when taking a deep breath.  I saw him in May for cough/respiratory tract infection.  That mostly resolved but he still has some residual cough and mucus.  Voice is little hoarse  He has a history of esophageal cancer.  He had dilatation with EGD in December 2022.  He historically has had to have dilatations every 3 to 4 months but he typically starts no swallowing problems.  Currently does not have any swallowing issues.  He does have intermittent hiccups ongoing even since the initial cancer treatment.  He denies fever, night sweats, chills, weight loss.  He does not feel sick currently.  No congestion, no sore throat, no other URI symptoms other than the residual cough  He just saw oncology within the last few weeks.  They are watching one of his labs CEA that was elevated this past time.  He is supposed to have labs again in a month.  He had a chest CT back in February 2023  No other aggravating or relieving factors.    No other c/o.  Past Medical History:  Diagnosis Date   Allergy    Asthma    Controlled type 2 diabetes mellitus without complication, without long-term current use of insulin ([place]) 06/25/2012   Diverticulosis    ED (erectile dysfunction) 12/28/2010   Esophageal cancer ([place]) 09/22/2019   Former smoker 06/25/2012   GERD (gastroesophageal reflux disease) 2002   History of colonic polyps 05/11/2016   Hyperlipidemia associated with type 2 diabetes mellitus ([place]) 05/11/2016   Hypertension associated with diabetes ([place]) 08/02/2017   denies   Mild intermittent asthma without complication 4/69/6295   Seasonal allergic rhinitis due to pollen 11/29/2016   Thoracic  aortic aneurysm [facility])    Ascending thoracic aorta measures 4 cm diameter   Tubular adenoma of colon 12/05/2016   Current Outpatient Medications on File Prior to Visit  Medication Sig Dispense Refill   albuterol (VENTOLIN HFA) 108 (90 Base) MCG/ACT inhaler Inhale 1 puff into the lungs every 6 (six) hours as needed for wheezing or shortness of breath.     atorvastatin (LIPITOR) 20 MG tablet Take 1 tablet (20 mg total) by mouth daily. 90 tablet 3   esomeprazole (NEXIUM) 40 MG capsule TAKE 1 CAPSULE BY MOUTH EVERY DAY FOR 90 DAYS 90 capsule 1   metFORMIN (GLUCOPHAGE) 500 MG tablet TAKE 1 TABLET BY MOUTH 2 TIMES DAILY WITH A MEAL. 180 tablet 1   Misc Natural Products (ESSIAC TONIC) LIQD Take 2 oz by mouth daily. Herbal Tea     Multiple Vitamins-Minerals (MULTIVITAMIN WITH MINERALS) tablet Take 1 tablet by mouth daily.     Azelaic Acid 15 % gel Apply 1 application topically daily as needed (Rosacea). (Patient not taking: Reported on 10/21/2021)     No current facility-administered medications on file prior to visit.     The following portions of the patient's history were reviewed and updated as appropriate: allergies, current medications, past family history, past medical history, past social history, past surgical history and problem list.  ROS Otherwise as in subjective above  Objective: BP 110/60   Pulse 68  Temp (!) 97.2 F (36.2 C)   Resp 16   Wt 156 lb 6.4 oz (70.9 kg)   SpO2 98%   BMI 24.87 kg/m   General appearance: alert, no distress, well developed, well nourished HEENT: normocephalic, sclerae anicteric, conjunctiva pink and moist, TMs pearly, nares patent, no discharge or erythema, pharynx normal Oral cavity: MMM, no lesions Neck: supple, no lymphadenopathy, no thyromegaly, no masses Heart: RRR, normal S1, S2, no murmurs Lungs: CTA bilaterally, no wheezes, rhonchi, or rales Chest wall normal inspiration expiration, no tenderness on palpation or percussion Pulses: 2+  radial pulses, 2+ pedal pulses, normal cap refill Ext: no edema   Assessment: Encounter Diagnoses  Name Primary?   Chest discomfort Yes   Cough, unspecified type    Mild intermittent asthma without complication    H/O esophagectomy    History of esophageal cancer      Plan: I reviewed his several prior chest abdomen pelvis CTs the chart record including the 1 in February 2023.  I reviewed recent labs.  His CEA marker was little elevated on the most recent lab  We discussed possible causes of his symptoms.  I saw him in May for respiratory tract infection and he has some residual cough from then, possible ongoing inflammation in the chest and the air quality lately has been a little worse which could be aggravating his symptoms  He had EGD in December 2022 with dilatation given some stenosis.  He normally gets swallowing issues leading up to having to have dilatation.  He does not have those symptoms now but consider GI consult if the symptoms do not improve  Recommendations: Begin Breztri inhaler sample 2 puffs twice daily for the next 1 to 2 weeks for cough, possible inflammation in the chest Rinse mouth out with water after use Hydrate well throughout the day at least 60 to 100 ounces of water per day Plan to follow-up for labs through oncology in the next month  If your symptoms do not improve over the next few weeks then oncology would like to go ahead and update your chest abdomen pelvis CT earlier than as planned   James Dunn was seen today for pain when taking deep breath.  Diagnoses and all orders for this visit:  Chest discomfort  Cough, unspecified type -     Spirometry with Graph  Mild intermittent asthma without complication  H/O esophagectomy  History of esophageal cancer    Follow up: with oncology in a month for labs as planned

## 2021-12-16 NOTE — Patient Instructions (Signed)
I reviewed his several prior chest abdomen pelvis CTs the chart record including the 1 in February 2023.  I reviewed recent labs.  His CEA marker was little elevated on the most recent lab  We discussed possible causes of his symptoms.  I saw him in May for respiratory tract infection and he has some residual cough from then, possible ongoing inflammation in the chest and the air quality lately has been a little worse which could be aggravating his symptoms  Recommendations: Begin Breztri inhaler sample 2 puffs twice daily for the next 1 to 2 weeks for cough, possible inflammation in the chest Rinse mouth out with water after use Hydrate well throughout the day at least 60 to 100 ounces of water per day Plan to follow-up for labs through oncology in the next month  If your symptoms do not improve over the next few weeks then oncology would like to go ahead and update your chest abdomen pelvis CT earlier than as planned

## 2021-12-24 ENCOUNTER — Encounter: Payer: Self-pay | Admitting: Family Medicine

## 2022-01-06 ENCOUNTER — Inpatient Hospital Stay: Payer: PPO | Attending: Hematology

## 2022-01-06 ENCOUNTER — Other Ambulatory Visit: Payer: Self-pay

## 2022-01-06 DIAGNOSIS — R970 Elevated carcinoembryonic antigen [CEA]: Secondary | ICD-10-CM | POA: Diagnosis not present

## 2022-01-06 DIAGNOSIS — Z8501 Personal history of malignant neoplasm of esophagus: Secondary | ICD-10-CM | POA: Insufficient documentation

## 2022-01-06 DIAGNOSIS — C155 Malignant neoplasm of lower third of esophagus: Secondary | ICD-10-CM

## 2022-01-06 LAB — CEA (IN HOUSE-CHCC): CEA (CHCC-In House): 18.56 ng/mL — ABNORMAL HIGH (ref 0.00–5.00)

## 2022-01-10 ENCOUNTER — Other Ambulatory Visit: Payer: Self-pay

## 2022-01-18 ENCOUNTER — Other Ambulatory Visit: Payer: Self-pay

## 2022-01-25 ENCOUNTER — Encounter (HOSPITAL_COMMUNITY): Payer: Self-pay

## 2022-01-25 ENCOUNTER — Ambulatory Visit (HOSPITAL_COMMUNITY)
Admission: RE | Admit: 2022-01-25 | Discharge: 2022-01-25 | Disposition: A | Discharge status: Home / Self Care | Payer: PPO | Source: Ambulatory Visit | Attending: Hematology | Admitting: Hematology

## 2022-01-25 DIAGNOSIS — K5730 Diverticulosis of large intestine without perforation or abscess without bleeding: Secondary | ICD-10-CM | POA: Diagnosis not present

## 2022-01-25 DIAGNOSIS — K8689 Other specified diseases of pancreas: Secondary | ICD-10-CM | POA: Diagnosis not present

## 2022-01-25 DIAGNOSIS — E1159 Type 2 diabetes mellitus with other circulatory complications: Secondary | ICD-10-CM | POA: Diagnosis not present

## 2022-01-25 DIAGNOSIS — J439 Emphysema, unspecified: Secondary | ICD-10-CM | POA: Diagnosis not present

## 2022-01-25 DIAGNOSIS — I152 Hypertension secondary to endocrine disorders: Secondary | ICD-10-CM | POA: Insufficient documentation

## 2022-01-25 DIAGNOSIS — I700 Atherosclerosis of aorta: Secondary | ICD-10-CM | POA: Diagnosis not present

## 2022-01-25 DIAGNOSIS — J8410 Pulmonary fibrosis, unspecified: Secondary | ICD-10-CM | POA: Diagnosis not present

## 2022-01-25 DIAGNOSIS — E119 Type 2 diabetes mellitus without complications: Secondary | ICD-10-CM | POA: Diagnosis not present

## 2022-01-25 DIAGNOSIS — Z8501 Personal history of malignant neoplasm of esophagus: Secondary | ICD-10-CM | POA: Diagnosis not present

## 2022-01-25 DIAGNOSIS — C155 Malignant neoplasm of lower third of esophagus: Secondary | ICD-10-CM | POA: Insufficient documentation

## 2022-01-25 DIAGNOSIS — K828 Other specified diseases of gallbladder: Secondary | ICD-10-CM | POA: Diagnosis not present

## 2022-01-25 LAB — POCT I-STAT CREATININE: Creatinine, Ser: 1.2 mg/dL (ref 0.61–1.24)

## 2022-01-25 MED ORDER — IOHEXOL 300 MG/ML  SOLN
100.0000 mL | Freq: Once | INTRAMUSCULAR | Status: AC | PRN
  Administered 2022-01-25: 100 mL via INTRAVENOUS

## 2022-01-25 MED ORDER — SODIUM CHLORIDE (PF) 0.9 % IJ SOLN
INTRAMUSCULAR | Status: AC
  Filled 2022-01-25: qty 50

## 2022-01-27 ENCOUNTER — Telehealth: Payer: Self-pay | Admitting: Hematology

## 2022-01-27 ENCOUNTER — Inpatient Hospital Stay: Payer: PPO | Attending: Hematology | Admitting: Hematology

## 2022-01-27 DIAGNOSIS — C155 Malignant neoplasm of lower third of esophagus: Secondary | ICD-10-CM | POA: Diagnosis not present

## 2022-01-27 NOTE — Telephone Encounter (Signed)
Rescheduled upcoming appointment per 8/10 los. Patient is aware of changes.

## 2022-01-27 NOTE — Progress Notes (Signed)
[place]   [phone] [phone]   Clinic Follow up Note   Patient Care Team: Denita Lung, MD as PCP - General (Family Medicine) [name], MD as Consulting Physician (Gastroenterology) [name], MD as Consulting Physician (Hematology) Minus Breeding, MD as Consulting Physician (Cardiology)  Date of Service:  01/27/2022  I connected with James Dunn on 01/27/2022 at  8:20 AM EDT by telephone visit and verified that I am speaking with the correct person using two identifiers.  I discussed the limitations, risks, security and privacy concerns of performing an evaluation and management service by telephone and the availability of in person appointments. I also discussed with the patient that there may be a patient responsible charge related to this service. The patient expressed understanding and agreed to proceed.   Other persons participating in the visit and their role in the encounter:  none  Patient's location:  home Provider's location:  my office  CHIEF COMPLAINT: f/u of esophageal cancer  CURRENT THERAPY:  Surveillance  ASSESSMENT & PLAN:  James Dunn is a 71 y.o. male with   1. Adenocarcinoma of distal esophagus, T3N2M0, stage IVA, ypT0N0  -His 08/30/19 EGD showed a large, fungating, partially circumferential mass in the lower third of the esophagus causing dysphagia. His PET from 09/20/19 showed no evidence of metastatic disease.  -His EUS from 09/13/19 which showed locally advanced adenocarcinoma invading the muscular layer with 2 enlarged LNs, T3N2M0. -He completed standard neoadjuvant concurrent ChemoRT with weekly Carboplatin and Taxol for 6 weeks 09/30/19 - 11/06/19.  -He proceeded with surgical resection by Dr Servando Snare on 01/01/20. Surgical path showed complete response to neoadjuvant chemoRT with no residual disease in surgery. Adjuvant treatment not recommended -He is currently on surveillance. His CEA showed a jump to 19.85  in 11/2021, stable at 18.56 on 01/06/22.  -restaging CT CP on 01/25/22 showed NED.  I personally reviewed the scan images and agree with radiologist's interpretation -I reviewed the results of the scan and his CEA with him today. I discussed that the CEA marker is nonspecific and could be elevated by a benign cause. He also continues to have a productive cough. I explained that his CT showed no concern for pneumonia or other concerns, so this could also be related to another cause, such as his prior smoking, or mild aspiration.  -he is clinically doing well from a cancer point of view. I discussed he can always f/u with Dr. [name] for EGD if he feels the cough could be related to reflux.    2. H/o Tobacco and alcohol use -He has long term tobacco abuse, quit in 03/2019.  -He noted he has cut out alcohol in 09/2019, still drinks some time. I recommend he continue cessation.   3 HTN, HL, DM, GERD  -Continue medications and f/u with PCP.      PLAN: -Lab and scan reviewed, NED.   -Lab and f/u on 10/19   No problem-specific Assessment & Plan notes found for this encounter.   SUMMARY OF ONCOLOGIC HISTORY: Oncology History Overview Note  Cancer Staging Esophageal cancer [facility]) Staging form: Esophagus - Adenocarcinoma, AJCC 8th Edition - Clinical stage from 09/13/2019: Stage IVA (cT3, cN2, cM0) - Signed by [name], MD on 09/22/2019 - Pathologic stage from 01/07/2020: Stage I (ypT0, pN0, cM0, G2) - Signed by [name], MD on 01/07/2020    Esophageal cancer ([place])  08/30/2019 Procedure   EGD by Dr. [name] 08/30/19  IMPRESSION -  Partially obstructing, malignant esophageal tumor was found in the lower third of the esophagus. Biopsied. Injected. - Normal stomach. - Normal examined duodenum.   08/30/2019 Initial Biopsy   FINAL MICROSCOPIC DIAGNOSIS:   A. ESOPHAGUS, BIOPSY:  - At least intramucosal adenocarcinoma.  - See comment.   COMMENT:   - The depth of invasion can not be determined due  to the superficial  nature of the biopsy.  Dr.  Vic Ripper has reviewed the case and concurs  with this interpretation.  Additional studies can be performed upon  clinician request.    09/05/2019 Imaging   CT CAP W Contrast 09/05/19  IMPRESSION: 1. Soft tissue mass noted distal esophagus, compatible with known neoplasm. There is a small 6 mm short axis adjacent paraesophageal node, concerning for metastatic disease. 2. Upper normal to mildly enlarged hepatoduodenal ligament lymph node, with upper normal right para-aortic nodes. As metastatic disease cannot be excluded, PET-CT may prove helpful to further evaluate. 3. 1.9 x 1.3 cm nodular collection of soft tissue posterior to the descending duodenum. This is in close proximity to the pancreatic head but a definite communication of parenchyma between these 2 structures is not discernible by CT. 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 Attention on follow-up recommended. 4. 6 mm perifissural nodule right middle lobe, likely a subpleural lymph node. Attention on follow-up recommended. 5. Hepatomegaly with hepatic steatosis. 6. Ascending thoracic aorta measures 4 cm diameter. Recommend annual imaging followup by CTA or MRA. This recommendation follows 2010 ACCF/AHA/AATS/ACR/ASA/SCA/SCAI/SIR/STS/SVM Guidelines for the Diagnosis and Management of Patients with Thoracic Aortic Disease. Circulation. 2010; 121: B716-R678. Aortic aneurysm NOS (ICD10-I71.9) 7. Left-sided IVC, normal variant.   09/11/2019 Tumor Marker   Baseline CEA 25.12 on 09/11/19   09/13/2019 Cancer Staging   Staging form: Esophagus - Adenocarcinoma, AJCC 8th Edition - Clinical stage from 09/13/2019: Stage IVA (cT3, cN2, cM0) - Signed by [name], MD on 09/22/2019   09/13/2019 Procedure   EUS by Dr. [name] 09/13/19  IMPRESSION - A mass was found in the lower third of the esophagus. A tissue diagnosis was obtained prior to this exam. This is of  adenocarcinoma. This was staged T3 N3 Mx by endosonographic criteria. - No specimens collected.   09/20/2019 PET scan   PET IMPRESSION: 1. Hypermetabolic distal esophageal primary. 2. Low-level hypermetabolism within upper abdominal nodes. These are technically nonspecific, but favored to be reactive in the setting of hepatic steatosis and hepatomegaly. 3. Otherwise, no evidence of metastatic disease. 4. Anal hypermetabolism could be physiologic. Consider correlation with physical exam and colonoscopy, if patient is not up-to-date. 5. Incidental findings, including: Aortic atherosclerosis (ICD10-I70.0), coronary artery atherosclerosis and emphysema (ICD10-J43.9). Sinus disease and bilateral mastoid effusions.   09/22/2019 Initial Diagnosis   Esophageal cancer ([place])   09/30/2019 - 11/04/2019 Chemotherapy   concurrent ChemoRT with weekly Carboplatin and Taxol starting 09/30/19. Last dose was Taxol alone given neutropenia.    09/30/2019 - 11/06/2019 Radiation Therapy   concurrent ChemoRT with Dr. Lisbeth Renshaw starting 09/30/19-11/06/19   12/05/2019 Imaging   CT CAP w Contrast  IMPRESSION: 1. Interval response to therapy. Approximately 50% reduction in size of distal esophageal mass 2. No new or progressive disease identified within the chest or abdomen. 3. Emphysema and aortic atherosclerosis. 4. Left main and 3 vessel coronary artery calcifications.   Aortic Atherosclerosis (ICD10-I70.0) and Emphysema (ICD10-J43.9).     01/01/2020 Surgery   VIDEO BRONCHOSCOPY WITH BRONCHIAL WASHING FOR CULTURES AND GRAM STAIN and TRANSHIATAL TOTAL ESOPHAGECTOMY COMPLETE WITH  NIMS TUBE and JEJUNOSTOMY and CHEST TUBE INSERTION with Dr Servando Snare.    01/01/2020 Pathology Results   FINAL MICROSCOPIC DIAGNOSIS:   A. ESOPHAGUS AND PARTIAL STOMACH, ESOPHAGECTOMY:  - No residual carcinoma identified.  - Reactive changes, mixed inflammation, and ulcer.  - Sixteen of sixteen lymph nodes negative for carcinoma (0/16).   - See oncology table.    01/07/2020 Cancer Staging   Staging form: Esophagus - Adenocarcinoma, AJCC 8th Edition - Pathologic stage from 01/07/2020: Stage I (ypT0, pN0, cM0, G2) - Signed by [name], MD on 01/07/2020   08/05/2020 Imaging   CT CAP  IMPRESSION: 1. No evidence of thoracic metastasis. 2. Gastric pull-up anatomy without evidence of local recurrence. 3. No evidence of metastatic disease in the abdomen pelvis. 4. Moderate volume stool throughout the colon. 5. Aortic Atherosclerosis (ICD10-I70.0).     05/28/2021 Procedure   Upper GI Endoscopy, Dr. [name]  Impression: - Benign-appearing esophageal stenosis. Dilated. - A previous surgical anastomosis was found, characterized by healthy appearing mucosa. - Normal examined duodenum. - No specimens collected.   08/05/2021 Imaging   EXAM: CT CHEST, ABDOMEN, AND PELVIS WITH CONTRAST  IMPRESSION: 1. Stable postsurgical changes of prior gastric pull-through without new or progressive findings to suggest recurrent or metastatic disease within the chest, abdomen, or pelvis. 2. Hepatic steatosis. 3. Colonic diverticulosis without findings of acute diverticulitis. 4. Aortic Atherosclerosis (ICD10-I70.0) and Emphysema (ICD10-J43.9).      INTERVAL HISTORY:  James Dunn was contacted for a follow up of esophageal cancer. He was last seen by me on 12/07/21.  He reports he continues to have a "hacking cough" and notes it's about the same as before.   All other systems were reviewed with the patient and are negative.  MEDICAL HISTORY:  Past Medical History:  Diagnosis Date   Allergy    Asthma    Controlled type 2 diabetes mellitus without complication, without long-term current use of insulin ([place]) 06/25/2012   Diverticulosis    ED (erectile dysfunction) 12/28/2010   Esophageal cancer ([place]) 09/22/2019   Former smoker 06/25/2012   GERD (gastroesophageal reflux disease) 2002   History of colonic polyps 05/11/2016    Hyperlipidemia associated with type 2 diabetes mellitus ([place]) 05/11/2016   Hypertension associated with diabetes ([place]) 08/02/2017   denies   Mild intermittent asthma without complication 1/66/0630   Seasonal allergic rhinitis due to pollen 11/29/2016   Thoracic aortic aneurysm ([facility])    Ascending thoracic aorta measures 4 cm diameter   Tubular adenoma of colon 12/05/2016    SURGICAL HISTORY: Past Surgical History:  Procedure Laterality Date   BALLOON DILATION N/A 03/20/2020   Procedure: BALLOON DILATION;  Surgeon: [name], MD;  Location: [facility];  Service: Endoscopy;  Laterality: N/A;   BALLOON DILATION N/A 04/03/2020   Procedure: BALLOON DILATION;  Surgeon: [name], MD;  [facility];  Service: Endoscopy;  Laterality: N/A;   BALLOON DILATION N/A 04/17/2020   Procedure: BALLOON DILATION;  Surgeon: [name], MD;  [facility];  Service: Endoscopy;  Laterality: N/A;   BALLOON DILATION N/A 05/01/2020   Procedure: BALLOON DILATION;  Surgeon: [name], MD;  [facility];  Service: Endoscopy;  Laterality: N/A;   BALLOON DILATION N/A 05/22/2020   Procedure: BALLOON DILATION;  Surgeon: [name], MD;  [facility];  Service: Endoscopy;  Laterality: N/A;   BALLOON DILATION N/A 06/05/2020   Procedure: BALLOON DILATION;  Surgeon: [name], MD;  [facility];  Service: Endoscopy;  Laterality: N/A;   BALLOON DILATION N/A 07/10/2020   Procedure: BALLOON DILATION;  Surgeon: [name], MD;  [facility];  Service: Endoscopy;  Laterality: N/A;   BALLOON DILATION N/A 09/24/2020   Procedure: BALLOON DILATION;  Surgeon: [name], MD;  [facility];  Service: Endoscopy;  Laterality: N/A;   BALLOON DILATION N/A 05/28/2021   Procedure: BALLOON DILATION;  Surgeon: [name], MD;  [facility];  Service: Endoscopy;  Laterality: N/A;   BIOPSY  08/30/2019   Procedure: BIOPSY;  Surgeon:  [name], MD;  [facility];  Service: Endoscopy;;   CHEST TUBE INSERTION Left 01/01/2020   Procedure: CHEST TUBE INSERTION;  Surgeon: [name], MD;  Location: [place];  Service: Thoracic;  Laterality: Left;   COMPLETE ESOPHAGECTOMY N/A 01/01/2020   Procedure: TRANSHIATAL TOTAL ESOPHAGECTOMY COMPLETE WITH NIMS TUBE;  Surgeon: [name], MD;  Location: [place];  Service: Thoracic;  Laterality: N/A;   ESOPHAGOGASTRODUODENOSCOPY (EGD) WITH PROPOFOL N/A 08/30/2019   Procedure: ESOPHAGOGASTRODUODENOSCOPY (EGD) WITH PROPOFOL;  Surgeon: [name], MD;  [facility];  Service: Endoscopy;  Laterality: N/A;   ESOPHAGOGASTRODUODENOSCOPY (EGD) WITH PROPOFOL N/A 09/13/2019   Procedure: ESOPHAGOGASTRODUODENOSCOPY (EGD) WITH PROPOFOL;  Surgeon: [name], MD;  [facility];  Service: Endoscopy;  Laterality: N/A;   ESOPHAGOGASTRODUODENOSCOPY (EGD) WITH PROPOFOL N/A 03/20/2020   Procedure: ESOPHAGOGASTRODUODENOSCOPY (EGD) WITH PROPOFOL;  Surgeon: [name], MD;  [facility];  Service: Endoscopy;  Laterality: N/A;   ESOPHAGOGASTRODUODENOSCOPY (EGD) WITH PROPOFOL N/A 04/03/2020   Procedure: ESOPHAGOGASTRODUODENOSCOPY (EGD) WITH PROPOFOL;  Surgeon: [name], MD;  [facility];  Service: Endoscopy;  Laterality: N/A;   ESOPHAGOGASTRODUODENOSCOPY (EGD) WITH PROPOFOL N/A 04/17/2020   Procedure: ESOPHAGOGASTRODUODENOSCOPY (EGD) WITH PROPOFOL;  Surgeon: [name], MD;  [facility];  Service: Endoscopy;  Laterality: N/A;   ESOPHAGOGASTRODUODENOSCOPY (EGD) WITH PROPOFOL N/A 05/01/2020   Procedure: ESOPHAGOGASTRODUODENOSCOPY (EGD) WITH PROPOFOL;  Surgeon: [name], MD;  [facility];  Service: Endoscopy;  Laterality: N/A;   ESOPHAGOGASTRODUODENOSCOPY (EGD) WITH PROPOFOL N/A 05/22/2020   Procedure: ESOPHAGOGASTRODUODENOSCOPY (EGD) WITH PROPOFOL;  Surgeon: [name], MD;  [facility];  Service:  Endoscopy;  Laterality: N/A;   ESOPHAGOGASTRODUODENOSCOPY (EGD) WITH PROPOFOL N/A 06/05/2020   Procedure: ESOPHAGOGASTRODUODENOSCOPY (EGD) WITH PROPOFOL;  Surgeon: [name], MD;  [facility];  Service: Endoscopy;  Laterality: N/A;   ESOPHAGOGASTRODUODENOSCOPY (EGD) WITH PROPOFOL N/A 07/10/2020   Procedure: ESOPHAGOGASTRODUODENOSCOPY (EGD) WITH PROPOFOL;  Surgeon: [name], MD;  [facility];  Service: Endoscopy;  Laterality: N/A;   ESOPHAGOGASTRODUODENOSCOPY (EGD) WITH PROPOFOL N/A 09/24/2020   Procedure: ESOPHAGOGASTRODUODENOSCOPY (EGD) WITH PROPOFOL;  Surgeon: [name], MD;  [facility];  Service: Endoscopy;  Laterality: N/A;   ESOPHAGOGASTRODUODENOSCOPY (EGD) WITH PROPOFOL N/A 05/28/2021   Procedure: ESOPHAGOGASTRODUODENOSCOPY (EGD) WITH PROPOFOL;  Surgeon: [name], MD;  [facility];  Service: Endoscopy;  Laterality: N/A;   JEJUNOSTOMY N/A 01/01/2020   Procedure: JEJUNOSTOMY;  Surgeon: [name], MD;  Location: [facility];  Service: Thoracic;  Laterality: N/A;   shave  Left 09/27/2021   macular seborrheic keratosis, pigmented   SKIN BIOPSY N/A 03/04/2019   upper back Nerofibroma and follicular cyst    SUBMUCOSAL INJECTION  08/30/2019   Procedure: SUBMUCOSAL INJECTION;  Surgeon: [name], MD;  [facility];  Service: Endoscopy;;   UPPER ESOPHAGEAL ENDOSCOPIC ULTRASOUND (EUS) N/A 09/13/2019   Procedure: UPPER ESOPHAGEAL ENDOSCOPIC ULTRASOUND (EUS);  Surgeon: [name], MD;  Location: [facility];  Service: Endoscopy;  Laterality:  N/A;   VIDEO BRONCHOSCOPY N/A 01/01/2020   Procedure: VIDEO BRONCHOSCOPY WITH BRONCHIAL WASHING FOR CULTURES AND GRAM STAIN;  Surgeon: [name], MD;  Location: [place];  Service: Thoracic;  Laterality: N/A;    I have reviewed the social history and family history with the patient and they are unchanged from previous note.  ALLERGIES:  has No Known  Allergies.  MEDICATIONS:  Current Outpatient Medications  Medication Sig Dispense Refill   albuterol (VENTOLIN HFA) 108 (90 Base) MCG/ACT inhaler Inhale 1 puff into the lungs every 6 (six) hours as needed for wheezing or shortness of breath.     atorvastatin (LIPITOR) 20 MG tablet Take 1 tablet (20 mg total) by mouth daily. 90 tablet 3   Azelaic Acid 15 % gel Apply 1 application topically daily as needed (Rosacea). (Patient not taking: Reported on 10/21/2021)     esomeprazole (NEXIUM) 40 MG capsule TAKE 1 CAPSULE BY MOUTH EVERY DAY FOR 90 DAYS 90 capsule 1   metFORMIN (GLUCOPHAGE) 500 MG tablet TAKE 1 TABLET BY MOUTH 2 TIMES DAILY WITH A MEAL. 180 tablet 1   Misc Natural Products (ESSIAC TONIC) LIQD Take 2 oz by mouth daily. Herbal Tea     Multiple Vitamins-Minerals (MULTIVITAMIN WITH MINERALS) tablet Take 1 tablet by mouth daily.     No current facility-administered medications for this visit.    PHYSICAL EXAMINATION: ECOG PERFORMANCE STATUS: 0 - Asymptomatic  There were no vitals filed for this visit. Wt Readings from Last 3 Encounters:  12/16/21 156 lb 6.4 oz (70.9 kg)  12/07/21 158 lb 1.6 oz (71.7 kg)  10/21/21 156 lb 3.2 oz (70.9 kg)     No vitals taken today, Exam not performed today  LABORATORY DATA:  I have reviewed the data as listed    Latest Ref Rng & Units 12/07/2021    8:13 AM 08/05/2021    8:14 AM 05/10/2021    9:53 AM  CBC  WBC 4.0 - 10.5 K/uL 5.1  4.8  6.0   Hemoglobin 13.0 - 17.0 g/dL 12.6  11.9  12.9   Hematocrit 39.0 - 52.0 % 36.3  34.8  38.0   Platelets 150 - 400 K/uL 195  189  174         Latest Ref Rng & Units 01/25/2022    7:50 AM 12/07/2021    8:13 AM 08/05/2021    8:14 AM  CMP  Glucose 70 - 99 mg/dL  139  127   BUN 8 - 23 mg/dL  13  10   Creatinine 0.61 - 1.24 mg/dL 1.20  1.09  0.94   Sodium 135 - 145 mmol/L  138  138   Potassium 3.5 - 5.1 mmol/L  4.5  4.2   Chloride 98 - 111 mmol/L  104  103   CO2 22 - 32 mmol/L  29  28   Calcium 8.9 - 10.3  mg/dL  9.4  9.1   Total Protein 6.5 - 8.1 g/dL  7.6  6.9   Total Bilirubin 0.3 - 1.2 mg/dL  0.8  0.5   Alkaline Phos 38 - 126 U/L  54  49   AST 15 - 41 U/L  29  20   ALT 0 - 44 U/L  29  19       RADIOGRAPHIC STUDIES: I have personally reviewed the radiological images as listed and agreed with the findings in the report. No results found.    No orders of the defined types were placed  in this encounter.  All questions were answered. The patient knows to call the clinic with any problems, questions or concerns. No barriers to learning was detected. The total time spent in the appointment was 22 minutes.     [name], MD 01/27/2022   I, Wilburn Mylar, am acting as scribe for [name], MD.   I have reviewed the above documentation for accuracy and completeness, and I agree with the above.

## 2022-01-28 ENCOUNTER — Other Ambulatory Visit: Payer: Self-pay

## 2022-02-07 DIAGNOSIS — K222 Esophageal obstruction: Secondary | ICD-10-CM | POA: Diagnosis not present

## 2022-02-07 DIAGNOSIS — R1310 Dysphagia, unspecified: Secondary | ICD-10-CM | POA: Diagnosis not present

## 2022-02-10 DIAGNOSIS — K222 Esophageal obstruction: Secondary | ICD-10-CM | POA: Diagnosis not present

## 2022-02-10 DIAGNOSIS — R1310 Dysphagia, unspecified: Secondary | ICD-10-CM | POA: Diagnosis not present

## 2022-02-19 ENCOUNTER — Other Ambulatory Visit: Payer: Self-pay

## 2022-02-23 ENCOUNTER — Encounter: Payer: Self-pay | Admitting: Internal Medicine

## 2022-02-24 ENCOUNTER — Other Ambulatory Visit (INDEPENDENT_AMBULATORY_CARE_PROVIDER_SITE_OTHER): Payer: PPO

## 2022-02-24 DIAGNOSIS — Z23 Encounter for immunization: Secondary | ICD-10-CM | POA: Diagnosis not present

## 2022-03-10 ENCOUNTER — Encounter: Payer: PPO | Admitting: Family Medicine

## 2022-03-18 ENCOUNTER — Telehealth: Payer: Self-pay | Admitting: Hematology

## 2022-03-18 NOTE — Telephone Encounter (Signed)
Spoke with patient confirming rescheduled 10/24 appointment

## 2022-03-19 ENCOUNTER — Other Ambulatory Visit: Payer: Self-pay

## 2022-03-21 ENCOUNTER — Other Ambulatory Visit: Payer: Self-pay

## 2022-03-28 ENCOUNTER — Ambulatory Visit (INDEPENDENT_AMBULATORY_CARE_PROVIDER_SITE_OTHER): Payer: PPO | Admitting: Family Medicine

## 2022-03-28 ENCOUNTER — Telehealth: Payer: Self-pay | Admitting: Licensed Clinical Social Worker

## 2022-03-28 ENCOUNTER — Encounter: Payer: Self-pay | Admitting: Family Medicine

## 2022-03-28 VITALS — BP 144/72 | HR 48 | Temp 96.8°F | Wt 156.2 lb

## 2022-03-28 DIAGNOSIS — Z9889 Other specified postprocedural states: Secondary | ICD-10-CM

## 2022-03-28 DIAGNOSIS — E1169 Type 2 diabetes mellitus with other specified complication: Secondary | ICD-10-CM | POA: Diagnosis not present

## 2022-03-28 DIAGNOSIS — J4520 Mild intermittent asthma, uncomplicated: Secondary | ICD-10-CM

## 2022-03-28 DIAGNOSIS — E1159 Type 2 diabetes mellitus with other circulatory complications: Secondary | ICD-10-CM | POA: Diagnosis not present

## 2022-03-28 DIAGNOSIS — E785 Hyperlipidemia, unspecified: Secondary | ICD-10-CM | POA: Diagnosis not present

## 2022-03-28 DIAGNOSIS — Z87891 Personal history of nicotine dependence: Secondary | ICD-10-CM | POA: Diagnosis not present

## 2022-03-28 DIAGNOSIS — E119 Type 2 diabetes mellitus without complications: Secondary | ICD-10-CM | POA: Diagnosis not present

## 2022-03-28 DIAGNOSIS — J301 Allergic rhinitis due to pollen: Secondary | ICD-10-CM | POA: Diagnosis not present

## 2022-03-28 DIAGNOSIS — I700 Atherosclerosis of aorta: Secondary | ICD-10-CM | POA: Diagnosis not present

## 2022-03-28 DIAGNOSIS — Z8501 Personal history of malignant neoplasm of esophagus: Secondary | ICD-10-CM | POA: Diagnosis not present

## 2022-03-28 DIAGNOSIS — Z9049 Acquired absence of other specified parts of digestive tract: Secondary | ICD-10-CM

## 2022-03-28 DIAGNOSIS — I152 Hypertension secondary to endocrine disorders: Secondary | ICD-10-CM

## 2022-03-28 LAB — POCT GLYCOSYLATED HEMOGLOBIN (HGB A1C): Hemoglobin A1C: 6.3 % — AB (ref 4.0–5.6)

## 2022-03-28 NOTE — Patient Outreach (Signed)
  Care Coordination   03/28/2022 Name: James Dunn MRN: [id number] DOB: 1950/12/11   Care Coordination Outreach Attempts:  An unsuccessful telephone outreach was attempted today to offer the patient information about available care coordination services as a benefit of their health plan.   Follow Up Plan:  Additional outreach attempts will be made to offer the patient care coordination information and services.   Encounter Outcome:  No Answer  Care Coordination Interventions Activated:  No   Care Coordination Interventions:  No, not indicated    Christa See, MSW, [email or web address] Phone [phone] 4:33 PM

## 2022-03-28 NOTE — Progress Notes (Signed)
Subjective:    Patient ID: James Dunn, male    DOB: 03-03-1951, 71 y.o.   MRN: [id number]  James Dunn is a 71 y.o. male who presents for follow-up of Type 2 diabetes mellitus.  Patient is not checking home blood sugars.   Home blood sugar records: patient does not check sugars How often is blood sugars being checked: maybe once a month Current symptoms/problems include none and have been stable. Daily foot checks: yes    Any foot concerns: fungus both feet Last eye exam:  05/05/21 Exercise:  staying active 20 minutes a day  I discussed immunizations and he is not interested.  He was involved in an RSV study but is not sure of the status of that.  He is followed regularly by oncology as well as GI for his underlying esophageal cancer and is seem to be doing fairly well with that.  He did stop his lisinopril stating he really did not want to be on any more medications. The following portions of the patient's history were reviewed and updated as appropriate: allergies, current medications, past medical history, past social history and problem list.  His allergies and asthma seem to be under good control.  Does have evidence of aortic atherosclerosis.  ROS as in subjective above.     Objective:    Physical Exam Alert and in no distress otherwise not examined. Hemoglobin A1c is 6.3 There were no vitals taken for this visit.  Lab Review    Latest Ref Rng & Units 01/25/2022    7:50 AM 12/07/2021    8:13 AM 09/06/2021   12:00 PM 09/06/2021   11:13 AM 09/06/2021   10:54 AM  Diabetic Labs  HbA1c 4.0 - 5.6 %     5.9   Microalbumin mg/L   16.1     Micro/Creat Ratio    7.3     Chol 100 - 199 mg/dL    165    HDL >39 mg/dL    58    Calc LDL 0 - 99 mg/dL    89    Triglycerides 0 - 149 mg/dL    98    Creatinine 0.61 - 1.24 mg/dL 1.20  1.09          12/16/2021    8:46 AM 12/07/2021    8:36 AM 10/21/2021    8:32 AM 10/01/2021    3:58 PM 09/06/2021   10:37 AM  BP/Weight  Systolic BP 353 614  431  540  Diastolic BP 60 73 80  66  Wt. (Lbs) 156.4 158.1 156.2 154 154.2  BMI 24.87 kg/m2 25.14 kg/m2 24.83 kg/m2 24.48 kg/m2 24.52 kg/m2      Latest Ref Rng & Units 09/06/2021   10:45 AM 05/05/2021   12:00 AM  Foot/eye exam completion dates  Eye Exam No Retinopathy  No Retinopathy      Foot Form Completion  Done      This result is from an external source.    Danner  reports that he quit smoking about 3 years ago. His smoking use included cigarettes. He has never used smokeless tobacco. He reports that he does not currently use alcohol after a past usage of about 7.0 standard drinks of alcohol per week. He reports that he does not use drugs.     Assessment & Plan:    Controlled type 2 diabetes mellitus without complication, without long-term current use of insulin ([place]) - Plan: POCT glycosylated hemoglobin (Hb A1C)  History  of esophageal cancer  H/O esophagectomy  Atherosclerosis of aorta [facility])  Former smoker  Hyperlipidemia associated with type 2 diabetes mellitus ([place])  Seasonal allergic rhinitis due to pollen  Hypertension associated with diabetes ([place])  Mild intermittent asthma without complication  Did encourage him to start taking the lisinopril again otherwise he will remain on the same medication regimen.  I will check him again in 4 to 6 months.  He was comfortable with that.

## 2022-03-28 NOTE — Patient Instructions (Signed)

## 2022-03-29 ENCOUNTER — Other Ambulatory Visit: Payer: Self-pay

## 2022-03-29 ENCOUNTER — Encounter: Payer: Self-pay | Admitting: Internal Medicine

## 2022-04-04 ENCOUNTER — Other Ambulatory Visit: Payer: PPO

## 2022-04-04 ENCOUNTER — Ambulatory Visit: Payer: PPO | Admitting: Hematology

## 2022-04-04 DIAGNOSIS — L570 Actinic keratosis: Secondary | ICD-10-CM | POA: Diagnosis not present

## 2022-04-04 DIAGNOSIS — L821 Other seborrheic keratosis: Secondary | ICD-10-CM | POA: Diagnosis not present

## 2022-04-04 DIAGNOSIS — D225 Melanocytic nevi of trunk: Secondary | ICD-10-CM | POA: Diagnosis not present

## 2022-04-04 DIAGNOSIS — L814 Other melanin hyperpigmentation: Secondary | ICD-10-CM | POA: Diagnosis not present

## 2022-04-07 ENCOUNTER — Ambulatory Visit: Payer: PPO | Admitting: Hematology

## 2022-04-07 ENCOUNTER — Other Ambulatory Visit: Payer: PPO

## 2022-04-10 ENCOUNTER — Other Ambulatory Visit: Payer: Self-pay | Admitting: Family Medicine

## 2022-04-11 ENCOUNTER — Encounter: Payer: Self-pay | Admitting: Internal Medicine

## 2022-04-12 ENCOUNTER — Inpatient Hospital Stay: Payer: PPO | Admitting: Hematology

## 2022-04-12 ENCOUNTER — Inpatient Hospital Stay: Payer: PPO | Attending: Hematology

## 2022-04-12 ENCOUNTER — Encounter: Payer: Self-pay | Admitting: Hematology

## 2022-04-12 VITALS — BP 112/69 | HR 65 | Temp 98.3°F | Resp 18 | Ht 66.5 in | Wt 154.5 lb

## 2022-04-12 DIAGNOSIS — I10 Essential (primary) hypertension: Secondary | ICD-10-CM | POA: Insufficient documentation

## 2022-04-12 DIAGNOSIS — Z8501 Personal history of malignant neoplasm of esophagus: Secondary | ICD-10-CM | POA: Insufficient documentation

## 2022-04-12 DIAGNOSIS — Z87891 Personal history of nicotine dependence: Secondary | ICD-10-CM | POA: Diagnosis not present

## 2022-04-12 DIAGNOSIS — R970 Elevated carcinoembryonic antigen [CEA]: Secondary | ICD-10-CM | POA: Diagnosis not present

## 2022-04-12 DIAGNOSIS — Z79899 Other long term (current) drug therapy: Secondary | ICD-10-CM | POA: Insufficient documentation

## 2022-04-12 DIAGNOSIS — E785 Hyperlipidemia, unspecified: Secondary | ICD-10-CM | POA: Insufficient documentation

## 2022-04-12 DIAGNOSIS — E119 Type 2 diabetes mellitus without complications: Secondary | ICD-10-CM | POA: Diagnosis not present

## 2022-04-12 DIAGNOSIS — C155 Malignant neoplasm of lower third of esophagus: Secondary | ICD-10-CM

## 2022-04-12 DIAGNOSIS — K219 Gastro-esophageal reflux disease without esophagitis: Secondary | ICD-10-CM | POA: Insufficient documentation

## 2022-04-12 LAB — CMP (CANCER CENTER ONLY)
ALT: 22 U/L (ref 0–44)
AST: 23 U/L (ref 15–41)
Albumin: 4.2 g/dL (ref 3.5–5.0)
Alkaline Phosphatase: 47 U/L (ref 38–126)
Anion gap: 5 (ref 5–15)
BUN: 11 mg/dL (ref 8–23)
CO2: 29 mmol/L (ref 22–32)
Calcium: 9.1 mg/dL (ref 8.9–10.3)
Chloride: 105 mmol/L (ref 98–111)
Creatinine: 1.03 mg/dL (ref 0.61–1.24)
GFR, Estimated: >60 mL/min (ref >60)
Glucose, Bld: 115 mg/dL — ABNORMAL HIGH (ref 70–99)
Potassium: 4.6 mmol/L (ref 3.5–5.1)
Sodium: 139 mmol/L (ref 135–145)
Total Bilirubin: 0.5 mg/dL (ref 0.3–1.2)
Total Protein: 7 g/dL (ref 6.5–8.1)

## 2022-04-12 LAB — CBC WITH DIFFERENTIAL (CANCER CENTER ONLY)
Abs Immature Granulocytes: 0.01 10*3/uL (ref 0.00–0.07)
Basophils Absolute: 0 10*3/uL (ref 0.0–0.1)
Basophils Relative: 1 %
Eosinophils Absolute: 0.2 10*3/uL (ref 0.0–0.5)
Eosinophils Relative: 4 %
HCT: 35.8 % — ABNORMAL LOW (ref 39.0–52.0)
Hemoglobin: 12.2 g/dL — ABNORMAL LOW (ref 13.0–17.0)
Immature Granulocytes: 0 %
Lymphocytes Relative: 26 %
Lymphs Abs: 1.3 10*3/uL (ref 0.7–4.0)
MCH: 32.2 pg (ref 26.0–34.0)
MCHC: 34.1 g/dL (ref 30.0–36.0)
MCV: 94.5 fL (ref 80.0–100.0)
Monocytes Absolute: 0.5 10*3/uL (ref 0.1–1.0)
Monocytes Relative: 10 %
Neutro Abs: 2.9 10*3/uL (ref 1.7–7.7)
Neutrophils Relative %: 59 %
Platelet Count: 197 10*3/uL (ref 150–400)
RBC: 3.79 MIL/uL — ABNORMAL LOW (ref 4.22–5.81)
RDW: 13.1 % (ref 11.5–15.5)
WBC Count: 4.9 10*3/uL (ref 4.0–10.5)
nRBC: 0 % (ref 0.0–0.2)

## 2022-04-12 LAB — CEA (IN HOUSE-CHCC): CEA (CHCC-In House): 18.38 ng/mL — ABNORMAL HIGH (ref 0.00–5.00)

## 2022-04-12 NOTE — Progress Notes (Signed)
[facility]   [phone] [phone]   Clinic Follow up Note   Patient Care Team: Denita Lung, MD as PCP - General (Family Medicine) [name], MD as Consulting Physician (Gastroenterology) [name], MD as Consulting Physician (Hematology) Minus Breeding, MD as Consulting Physician (Cardiology)  Date of Service:  04/12/2022  CHIEF COMPLAINT: f/u of esophageal cancer  CURRENT THERAPY:  Surveillance  ASSESSMENT & PLAN:  James Dunn is a 71 y.o. male with   1. Adenocarcinoma of distal esophagus, T3N2M0, stage IVA, ypT0N0  -His 08/30/19 EGD showed a large, fungating, partially circumferential mass in the lower third of the esophagus causing dysphagia. His PET from 09/20/19 showed no evidence of metastatic disease.  -His EUS from 09/13/19 which showed locally advanced adenocarcinoma invading the muscular layer with 2 enlarged LNs, T3N2M0. -He completed standard neoadjuvant concurrent ChemoRT with weekly Carboplatin and Taxol for 6 weeks 09/30/19 - 11/06/19.  -He proceeded with surgical resection by Dr Servando Snare on 01/01/20. Surgical path showed complete response to neoadjuvant chemoRT with no residual disease in surgery.  -He is currently on surveillance. His CEA showed a jump to 19.85 in 11/2021, stable at 18.56 on 01/06/22.  -restaging CT CP on 01/25/22 showed NED. Plan for repeat in 08/2022; I ordered today. -he is clinically doing well from a cancer point of view. His last EGD on 02/10/22 with Dr. Rush Landmark was good and improved. Labs reviewed, hgb has been slightly low since diagnosis, overall stable. Physical exam was unremarkable. There is no clinical concern for recurrence.   2. H/o Tobacco and alcohol use -He has long term tobacco abuse, quit in 03/2019.  -He noted he has cut out alcohol in 09/2019, still drinks some time. I recommend he continue cessation.   3. HTN, HL, DM, GERD  -Continue medications and f/u with PCP.      PLAN: -f/u in 5  months, with lab and CT several days before   No problem-specific Assessment & Plan notes found for this encounter.   SUMMARY OF ONCOLOGIC HISTORY: Oncology History Overview Note  Cancer Staging Esophageal cancer [facility]) Staging form: Esophagus - Adenocarcinoma, AJCC 8th Edition - Clinical stage from 09/13/2019: Stage IVA (cT3, cN2, cM0) - Signed by [name], MD on 09/22/2019 - Pathologic stage from 01/07/2020: Stage I (ypT0, pN0, cM0, G2) - Signed by [name], MD on 01/07/2020    Esophageal cancer ([place])  08/30/2019 Procedure   EGD by Dr. [name] 08/30/19  IMPRESSION - Partially obstructing, malignant esophageal tumor was found in the lower third of the esophagus. Biopsied. Injected. - Normal stomach. - Normal examined duodenum.   08/30/2019 Initial Biopsy   FINAL MICROSCOPIC DIAGNOSIS:   A. ESOPHAGUS, BIOPSY:  - At least intramucosal adenocarcinoma.  - See comment.   COMMENT:   - The depth of invasion can not be determined due to the superficial  nature of the biopsy.  Dr.  Vic Ripper has reviewed the case and concurs  with this interpretation.  Additional studies can be performed upon  clinician request.    09/05/2019 Imaging   CT CAP W Contrast 09/05/19  IMPRESSION: 1. Soft tissue mass noted distal esophagus, compatible with known neoplasm. There is a small 6 mm short axis adjacent paraesophageal node, concerning for metastatic disease. 2. Upper normal to mildly enlarged hepatoduodenal ligament lymph node, with upper normal right para-aortic nodes. As metastatic disease cannot be excluded, PET-CT may prove helpful to further evaluate. 3. 1.9 x 1.3 cm nodular collection of  soft tissue posterior to the descending duodenum. This is in close proximity to the pancreatic head but a definite communication of parenchyma between these 2 structures is not discernible by CT. 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 Attention on  follow-up recommended. 4. 6 mm perifissural nodule right middle lobe, likely a subpleural lymph node. Attention on follow-up recommended. 5. Hepatomegaly with hepatic steatosis. 6. Ascending thoracic aorta measures 4 cm diameter. Recommend annual imaging followup by CTA or MRA. This recommendation follows 2010 ACCF/AHA/AATS/ACR/ASA/SCA/SCAI/SIR/STS/SVM Guidelines for the Diagnosis and Management of Patients with Thoracic Aortic Disease. Circulation. 2010; 121: G401-U272. Aortic aneurysm NOS (ICD10-I71.9) 7. Left-sided IVC, normal variant.   09/11/2019 Tumor Marker   Baseline CEA 25.12 on 09/11/19   09/13/2019 Cancer Staging   Staging form: Esophagus - Adenocarcinoma, AJCC 8th Edition - Clinical stage from 09/13/2019: Stage IVA (cT3, cN2, cM0) - Signed by [name], MD on 09/22/2019   09/13/2019 Procedure   EUS by Dr. [name] 09/13/19  IMPRESSION - A mass was found in the lower third of the esophagus. A tissue diagnosis was obtained prior to this exam. This is of adenocarcinoma. This was staged T3 N3 Mx by endosonographic criteria. - No specimens collected.   09/20/2019 PET scan   PET IMPRESSION: 1. Hypermetabolic distal esophageal primary. 2. Low-level hypermetabolism within upper abdominal nodes. These are technically nonspecific, but favored to be reactive in the setting of hepatic steatosis and hepatomegaly. 3. Otherwise, no evidence of metastatic disease. 4. Anal hypermetabolism could be physiologic. Consider correlation with physical exam and colonoscopy, if patient is not up-to-date. 5. Incidental findings, including: Aortic atherosclerosis (ICD10-I70.0), coronary artery atherosclerosis and emphysema (ICD10-J43.9). Sinus disease and bilateral mastoid effusions.   09/22/2019 Initial Diagnosis   Esophageal cancer ([place])   09/30/2019 - 11/04/2019 Chemotherapy   concurrent ChemoRT with weekly Carboplatin and Taxol starting 09/30/19. Last dose was Taxol alone given neutropenia.     09/30/2019 - 11/06/2019 Radiation Therapy   concurrent ChemoRT with Dr. Lisbeth Renshaw starting 09/30/19-11/06/19   12/05/2019 Imaging   CT CAP w Contrast  IMPRESSION: 1. Interval response to therapy. Approximately 50% reduction in size of distal esophageal mass 2. No new or progressive disease identified within the chest or abdomen. 3. Emphysema and aortic atherosclerosis. 4. Left main and 3 vessel coronary artery calcifications.   Aortic Atherosclerosis (ICD10-I70.0) and Emphysema (ICD10-J43.9).     01/01/2020 Surgery   VIDEO BRONCHOSCOPY WITH BRONCHIAL WASHING FOR CULTURES AND GRAM STAIN and TRANSHIATAL TOTAL ESOPHAGECTOMY COMPLETE WITH NIMS TUBE and JEJUNOSTOMY and CHEST TUBE INSERTION with Dr Servando Snare.    01/01/2020 Pathology Results   FINAL MICROSCOPIC DIAGNOSIS:   A. ESOPHAGUS AND PARTIAL STOMACH, ESOPHAGECTOMY:  - No residual carcinoma identified.  - Reactive changes, mixed inflammation, and ulcer.  - Sixteen of sixteen lymph nodes negative for carcinoma (0/16).  - See oncology table.    01/07/2020 Cancer Staging   Staging form: Esophagus - Adenocarcinoma, AJCC 8th Edition - Pathologic stage from 01/07/2020: Stage I (ypT0, pN0, cM0, G2) - Signed by [name], MD on 01/07/2020   08/05/2020 Imaging   CT CAP  IMPRESSION: 1. No evidence of thoracic metastasis. 2. Gastric pull-up anatomy without evidence of local recurrence. 3. No evidence of metastatic disease in the abdomen pelvis. 4. Moderate volume stool throughout the colon. 5. Aortic Atherosclerosis (ICD10-I70.0).     05/28/2021 Procedure   Upper GI Endoscopy, Dr. [name]  Impression: - Benign-appearing esophageal stenosis. Dilated. - A previous surgical anastomosis was found, characterized by healthy  appearing mucosa. - Normal examined duodenum. - No specimens collected.   08/05/2021 Imaging   EXAM: CT CHEST, ABDOMEN, AND PELVIS WITH CONTRAST  IMPRESSION: 1. Stable postsurgical changes of prior gastric pull-through  without new or progressive findings to suggest recurrent or metastatic disease within the chest, abdomen, or pelvis. 2. Hepatic steatosis. 3. Colonic diverticulosis without findings of acute diverticulitis. 4. Aortic Atherosclerosis (ICD10-I70.0) and Emphysema (ICD10-J43.9).      INTERVAL HISTORY:  James Dunn is here for a follow up of esophageal cancer. He was last seen by me on 01/27/22. He presents to the clinic alone. He reports he is doing well overall. He notes he continues to play golf twice a week. He denies any new changes or concerns.   All other systems were reviewed with the patient and are negative.  MEDICAL HISTORY:  Past Medical History:  Diagnosis Date   Allergy    Asthma    Controlled type 2 diabetes mellitus without complication, without long-term current use of insulin ([place]) 06/25/2012   Diverticulosis    ED (erectile dysfunction) 12/28/2010   Esophageal cancer ([place]) 09/22/2019   Former smoker 06/25/2012   GERD (gastroesophageal reflux disease) 2002   History of colonic polyps 05/11/2016   Hyperlipidemia associated with type 2 diabetes mellitus ([place]) 05/11/2016   Hypertension associated with diabetes ([place]) 08/02/2017   denies   Mild intermittent asthma without complication 8/50/2774   Seasonal allergic rhinitis due to pollen 11/29/2016   Thoracic aortic aneurysm ([facility])    Ascending thoracic aorta measures 4 cm diameter   Tubular adenoma of colon 12/05/2016    SURGICAL HISTORY: Past Surgical History:  Procedure Laterality Date   BALLOON DILATION N/A 03/20/2020   Procedure: BALLOON DILATION;  Surgeon: [name], MD;  Location: [facility];  Service: Endoscopy;  Laterality: N/A;   BALLOON DILATION N/A 04/03/2020   Procedure: BALLOON DILATION;  Surgeon: [name], MD;  [facility];  Service: Endoscopy;  Laterality: N/A;   BALLOON DILATION N/A 04/17/2020   Procedure: BALLOON DILATION;  Surgeon: [name], MD;  [facility];  Service:  Endoscopy;  Laterality: N/A;   BALLOON DILATION N/A 05/01/2020   Procedure: BALLOON DILATION;  Surgeon: [name], MD;  [facility];  Service: Endoscopy;  Laterality: N/A;   BALLOON DILATION N/A 05/22/2020   Procedure: BALLOON DILATION;  Surgeon: [name], MD;  [facility];  Service: Endoscopy;  Laterality: N/A;   BALLOON DILATION N/A 06/05/2020   Procedure: BALLOON DILATION;  Surgeon: [name], MD;  [facility];  Service: Endoscopy;  Laterality: N/A;   BALLOON DILATION N/A 07/10/2020   Procedure: BALLOON DILATION;  Surgeon: [name], MD;  [facility];  Service: Endoscopy;  Laterality: N/A;   BALLOON DILATION N/A 09/24/2020   Procedure: BALLOON DILATION;  Surgeon: [name], MD;  [facility];  Service: Endoscopy;  Laterality: N/A;   BALLOON DILATION N/A 05/28/2021   Procedure: BALLOON DILATION;  Surgeon: [name], MD;  [facility];  Service: Endoscopy;  Laterality: N/A;   BIOPSY  08/30/2019   Procedure: BIOPSY;  Surgeon: [name], MD;  [facility];  Service: Endoscopy;;   CHEST TUBE INSERTION Left 01/01/2020   Procedure: CHEST TUBE INSERTION;  Surgeon: [name], MD;  Location: [place];  Service: Thoracic;  Laterality: Left;   COMPLETE ESOPHAGECTOMY N/A 01/01/2020   Procedure: TRANSHIATAL TOTAL ESOPHAGECTOMY COMPLETE WITH NIMS TUBE;  Surgeon: [name], MD;  Location: [place];  Service: Thoracic;  Laterality: N/A;   ESOPHAGOGASTRODUODENOSCOPY (EGD) WITH PROPOFOL N/A 08/30/2019   Procedure: ESOPHAGOGASTRODUODENOSCOPY (EGD) WITH PROPOFOL;  Surgeon: [name], MD;  [facility];  Service: Endoscopy;  Laterality: N/A;   ESOPHAGOGASTRODUODENOSCOPY (EGD) WITH PROPOFOL N/A 09/13/2019   Procedure: ESOPHAGOGASTRODUODENOSCOPY (EGD) WITH PROPOFOL;  Surgeon: [name], MD;  [facility];  Service: Endoscopy;  Laterality: N/A;   ESOPHAGOGASTRODUODENOSCOPY  (EGD) WITH PROPOFOL N/A 03/20/2020   Procedure: ESOPHAGOGASTRODUODENOSCOPY (EGD) WITH PROPOFOL;  Surgeon: [name], MD;  [facility];  Service: Endoscopy;  Laterality: N/A;   ESOPHAGOGASTRODUODENOSCOPY (EGD) WITH PROPOFOL N/A 04/03/2020   Procedure: ESOPHAGOGASTRODUODENOSCOPY (EGD) WITH PROPOFOL;  Surgeon: [name], MD;  [facility];  Service: Endoscopy;  Laterality: N/A;   ESOPHAGOGASTRODUODENOSCOPY (EGD) WITH PROPOFOL N/A 04/17/2020   Procedure: ESOPHAGOGASTRODUODENOSCOPY (EGD) WITH PROPOFOL;  Surgeon: [name], MD;  [facility];  Service: Endoscopy;  Laterality: N/A;   ESOPHAGOGASTRODUODENOSCOPY (EGD) WITH PROPOFOL N/A 05/01/2020   Procedure: ESOPHAGOGASTRODUODENOSCOPY (EGD) WITH PROPOFOL;  Surgeon: [name], MD;  [facility];  Service: Endoscopy;  Laterality: N/A;   ESOPHAGOGASTRODUODENOSCOPY (EGD) WITH PROPOFOL N/A 05/22/2020   Procedure: ESOPHAGOGASTRODUODENOSCOPY (EGD) WITH PROPOFOL;  Surgeon: [name], MD;  [facility];  Service: Endoscopy;  Laterality: N/A;   ESOPHAGOGASTRODUODENOSCOPY (EGD) WITH PROPOFOL N/A 06/05/2020   Procedure: ESOPHAGOGASTRODUODENOSCOPY (EGD) WITH PROPOFOL;  Surgeon: [name], MD;  [facility];  Service: Endoscopy;  Laterality: N/A;   ESOPHAGOGASTRODUODENOSCOPY (EGD) WITH PROPOFOL N/A 07/10/2020   Procedure: ESOPHAGOGASTRODUODENOSCOPY (EGD) WITH PROPOFOL;  Surgeon: [name], MD;  [facility];  Service: Endoscopy;  Laterality: N/A;   ESOPHAGOGASTRODUODENOSCOPY (EGD) WITH PROPOFOL N/A 09/24/2020   Procedure: ESOPHAGOGASTRODUODENOSCOPY (EGD) WITH PROPOFOL;  Surgeon: [name], MD;  [facility];  Service: Endoscopy;  Laterality: N/A;   ESOPHAGOGASTRODUODENOSCOPY (EGD) WITH PROPOFOL N/A 05/28/2021   Procedure: ESOPHAGOGASTRODUODENOSCOPY (EGD) WITH PROPOFOL;  Surgeon: [name], MD;  [facility];  Service: Endoscopy;  Laterality: N/A;    JEJUNOSTOMY N/A 01/01/2020   Procedure: JEJUNOSTOMY;  Surgeon: [name], MD;  Location: [facility];  Service: Thoracic;  Laterality: N/A;   shave  Left 09/27/2021   macular seborrheic keratosis, pigmented   SKIN BIOPSY N/A 03/04/2019   upper back Nerofibroma and follicular cyst    SUBMUCOSAL INJECTION  08/30/2019   Procedure: SUBMUCOSAL INJECTION;  Surgeon: [name], MD;  [facility];  Service: Endoscopy;;   UPPER ESOPHAGEAL ENDOSCOPIC ULTRASOUND (EUS) N/A 09/13/2019   Procedure: UPPER ESOPHAGEAL ENDOSCOPIC ULTRASOUND (EUS);  Surgeon: [name], MD;  Location: [facility];  Service: Endoscopy;  Laterality: N/A;   VIDEO BRONCHOSCOPY N/A 01/01/2020   Procedure: VIDEO BRONCHOSCOPY WITH BRONCHIAL WASHING FOR CULTURES AND GRAM STAIN;  Surgeon: [name], MD;  Location: [place];  Service: Thoracic;  Laterality: N/A;    I have reviewed the social history and family history with the patient and they are unchanged from previous note.  ALLERGIES:  has No Known Allergies.  MEDICATIONS:  Current Outpatient Medications  Medication Sig Dispense Refill   albuterol (VENTOLIN HFA) 108 (90 Base) MCG/ACT inhaler Inhale 1 puff into the lungs every 6 (six) hours as needed for wheezing or shortness of breath. (Patient not taking: Reported on 03/28/2022)     atorvastatin (LIPITOR) 20 MG tablet Take 1 tablet (20 mg total) by mouth daily. 90 tablet 3   Azelaic Acid 15 % gel Apply 1 application topically daily as needed (Rosacea). (Patient not taking: Reported on 10/21/2021)     budesonide-formoterol (SYMBICORT) 160-4.5 MCG/ACT  inhaler Inhale 2 puffs twice a day by inhalation route for 30 days. (Patient not taking: Reported on 03/28/2022)     esomeprazole (NEXIUM) 40 MG capsule TAKE 1 CAPSULE BY MOUTH EVERY DAY 90 capsule 1   metFORMIN (GLUCOPHAGE) 500 MG tablet TAKE 1 TABLET BY MOUTH 2 TIMES DAILY WITH A MEAL. 180 tablet 1   Misc Natural Products (ESSIAC TONIC) LIQD Take 2 oz by  mouth daily. Herbal Tea     Multiple Vitamins-Minerals (MULTIVITAMIN WITH MINERALS) tablet Take 1 tablet by mouth daily.     No current facility-administered medications for this visit.    PHYSICAL EXAMINATION: ECOG PERFORMANCE STATUS: 0 - Asymptomatic  Vitals:   04/12/22 1353  BP: 112/69  Pulse: 65  Resp: 18  Temp: 98.3 F (36.8 C)  SpO2: 100%   Wt Readings from Last 3 Encounters:  04/12/22 154 lb 8 oz (70.1 kg)  03/28/22 156 lb 3.2 oz (70.9 kg)  12/16/21 156 lb 6.4 oz (70.9 kg)     GENERAL:alert, no distress and comfortable SKIN: skin color, texture, turgor are normal, no rashes or significant lesions EYES: normal, Conjunctiva are pink and non-injected, sclera clear  NECK: supple, thyroid normal size, non-tender, without nodularity LYMPH:  no palpable lymphadenopathy in the cervical, axillary  LUNGS: clear to auscultation and percussion with normal breathing effort HEART: regular rate & rhythm and no murmurs and no lower extremity edema ABDOMEN:abdomen soft, non-tender and normal bowel sounds Musculoskeletal:no cyanosis of digits and no clubbing  NEURO: alert & oriented x 3 with fluent speech, no focal motor/sensory deficits  LABORATORY DATA:  I have reviewed the data as listed    Latest Ref Rng & Units 04/12/2022    1:36 PM 12/07/2021    8:13 AM 08/05/2021    8:14 AM  CBC  WBC 4.0 - 10.5 K/uL 4.9  5.1  4.8   Hemoglobin 13.0 - 17.0 g/dL 12.2  12.6  11.9   Hematocrit 39.0 - 52.0 % 35.8  36.3  34.8   Platelets 150 - 400 K/uL 197  195  189         Latest Ref Rng & Units 04/12/2022    1:36 PM 01/25/2022    7:50 AM 12/07/2021    8:13 AM  CMP  Glucose 70 - 99 mg/dL 115   139   BUN 8 - 23 mg/dL 11   13   Creatinine 0.61 - 1.24 mg/dL 1.03  1.20  1.09   Sodium 135 - 145 mmol/L 139   138   Potassium 3.5 - 5.1 mmol/L 4.6   4.5   Chloride 98 - 111 mmol/L 105   104   CO2 22 - 32 mmol/L 29   29   Calcium 8.9 - 10.3 mg/dL 9.1   9.4   Total Protein 6.5 - 8.1 g/dL 7.0    7.6   Total Bilirubin 0.3 - 1.2 mg/dL 0.5   0.8   Alkaline Phos 38 - 126 U/L 47   54   AST 15 - 41 U/L 23   29   ALT 0 - 44 U/L 22   29       RADIOGRAPHIC STUDIES: I have personally reviewed the radiological images as listed and agreed with the findings in the report. No results found.    Orders Placed This Encounter  Procedures   CT CHEST ABDOMEN PELVIS W CONTRAST    Standing Status:   Future    Standing Expiration Date:   04/13/2023  Order Specific Question:   Preferred imaging location?    Answer:   [facility]    Order Specific Question:   Release to patient    Answer:   Immediate    Order Specific Question:   Is Oral Contrast requested for this exam?    Answer:   Yes, Per Radiology protocol   All questions were answered. The patient knows to call the clinic with any problems, questions or concerns. No barriers to learning was detected. The total time spent in the appointment was 30 minutes.     [name], MD 04/12/2022   I, Wilburn Mylar, am acting as scribe for [name], MD.   I have reviewed the above documentation for accuracy and completeness, and I agree with the above.

## 2022-05-17 DIAGNOSIS — H04123 Dry eye syndrome of bilateral lacrimal glands: Secondary | ICD-10-CM | POA: Diagnosis not present

## 2022-05-17 DIAGNOSIS — H02834 Dermatochalasis of left upper eyelid: Secondary | ICD-10-CM | POA: Diagnosis not present

## 2022-05-17 DIAGNOSIS — H02831 Dermatochalasis of right upper eyelid: Secondary | ICD-10-CM | POA: Diagnosis not present

## 2022-05-17 DIAGNOSIS — H2513 Age-related nuclear cataract, bilateral: Secondary | ICD-10-CM | POA: Diagnosis not present

## 2022-05-17 DIAGNOSIS — H5703 Miosis: Secondary | ICD-10-CM | POA: Diagnosis not present

## 2022-05-17 DIAGNOSIS — E119 Type 2 diabetes mellitus without complications: Secondary | ICD-10-CM | POA: Diagnosis not present

## 2022-05-17 LAB — HM DIABETES EYE EXAM

## 2022-05-18 ENCOUNTER — Encounter: Payer: Self-pay | Admitting: Family Medicine

## 2022-05-26 ENCOUNTER — Other Ambulatory Visit: Payer: Self-pay | Admitting: Family Medicine

## 2022-05-26 DIAGNOSIS — E119 Type 2 diabetes mellitus without complications: Secondary | ICD-10-CM

## 2022-05-31 ENCOUNTER — Telehealth: Payer: Self-pay | Admitting: Family Medicine

## 2022-05-31 NOTE — Telephone Encounter (Signed)
Pt left message he wants to know if he is ok to give blood.  [phone]

## 2022-08-09 ENCOUNTER — Other Ambulatory Visit: Payer: Self-pay

## 2022-08-09 ENCOUNTER — Telehealth: Payer: Self-pay | Admitting: Hematology

## 2022-08-09 NOTE — Telephone Encounter (Signed)
Contacted patient to scheduled appointments. Patient is aware of appointments that are scheduled.   

## 2022-08-20 ENCOUNTER — Other Ambulatory Visit: Payer: Self-pay | Admitting: Family Medicine

## 2022-08-20 DIAGNOSIS — E119 Type 2 diabetes mellitus without complications: Secondary | ICD-10-CM

## 2022-09-05 ENCOUNTER — Encounter (HOSPITAL_COMMUNITY): Payer: Self-pay

## 2022-09-05 ENCOUNTER — Inpatient Hospital Stay: Payer: PPO | Attending: Hematology

## 2022-09-05 ENCOUNTER — Ambulatory Visit (HOSPITAL_COMMUNITY)
Admission: RE | Admit: 2022-09-05 | Discharge: 2022-09-05 | Disposition: A | Discharge status: Home / Self Care | Payer: PPO | Source: Ambulatory Visit | Attending: Hematology | Admitting: Hematology

## 2022-09-05 ENCOUNTER — Other Ambulatory Visit: Payer: Self-pay

## 2022-09-05 DIAGNOSIS — J439 Emphysema, unspecified: Secondary | ICD-10-CM | POA: Diagnosis not present

## 2022-09-05 DIAGNOSIS — C155 Malignant neoplasm of lower third of esophagus: Secondary | ICD-10-CM | POA: Insufficient documentation

## 2022-09-05 DIAGNOSIS — K760 Fatty (change of) liver, not elsewhere classified: Secondary | ICD-10-CM | POA: Diagnosis not present

## 2022-09-05 DIAGNOSIS — R918 Other nonspecific abnormal finding of lung field: Secondary | ICD-10-CM | POA: Diagnosis not present

## 2022-09-05 DIAGNOSIS — K5730 Diverticulosis of large intestine without perforation or abscess without bleeding: Secondary | ICD-10-CM | POA: Diagnosis not present

## 2022-09-05 LAB — CMP (CANCER CENTER ONLY)
ALT: 24 U/L (ref 0–44)
AST: 24 U/L (ref 15–41)
Albumin: 4.5 g/dL (ref 3.5–5.0)
Alkaline Phosphatase: 44 U/L (ref 38–126)
Anion gap: 6 (ref 5–15)
BUN: 8 mg/dL (ref 8–23)
CO2: 29 mmol/L (ref 22–32)
Calcium: 9.2 mg/dL (ref 8.9–10.3)
Chloride: 105 mmol/L (ref 98–111)
Creatinine: 1.04 mg/dL (ref 0.61–1.24)
GFR, Estimated: >60 mL/min (ref >60)
Glucose, Bld: 101 mg/dL — ABNORMAL HIGH (ref 70–99)
Potassium: 4.6 mmol/L (ref 3.5–5.1)
Sodium: 140 mmol/L (ref 135–145)
Total Bilirubin: 0.8 mg/dL (ref 0.3–1.2)
Total Protein: 7.2 g/dL (ref 6.5–8.1)

## 2022-09-05 LAB — CBC WITH DIFFERENTIAL (CANCER CENTER ONLY)
Abs Immature Granulocytes: 0.02 10*3/uL (ref 0.00–0.07)
Basophils Absolute: 0 10*3/uL (ref 0.0–0.1)
Basophils Relative: 0 %
Eosinophils Absolute: 0.2 10*3/uL (ref 0.0–0.5)
Eosinophils Relative: 4 %
HCT: 36.5 % — ABNORMAL LOW (ref 39.0–52.0)
Hemoglobin: 12.5 g/dL — ABNORMAL LOW (ref 13.0–17.0)
Immature Granulocytes: 0 %
Lymphocytes Relative: 26 %
Lymphs Abs: 1.2 10*3/uL (ref 0.7–4.0)
MCH: 32.5 pg (ref 26.0–34.0)
MCHC: 34.2 g/dL (ref 30.0–36.0)
MCV: 94.8 fL (ref 80.0–100.0)
Monocytes Absolute: 0.4 10*3/uL (ref 0.1–1.0)
Monocytes Relative: 8 %
Neutro Abs: 2.8 10*3/uL (ref 1.7–7.7)
Neutrophils Relative %: 62 %
Platelet Count: 175 10*3/uL (ref 150–400)
RBC: 3.85 MIL/uL — ABNORMAL LOW (ref 4.22–5.81)
RDW: 13.1 % (ref 11.5–15.5)
WBC Count: 4.5 10*3/uL (ref 4.0–10.5)
nRBC: 0 % (ref 0.0–0.2)

## 2022-09-05 LAB — CEA (IN HOUSE-CHCC): CEA (CHCC-In House): 23.14 ng/mL — ABNORMAL HIGH (ref 0.00–5.00)

## 2022-09-05 MED ORDER — IOHEXOL 300 MG/ML  SOLN
100.0000 mL | Freq: Once | INTRAMUSCULAR | Status: AC | PRN
  Administered 2022-09-05: 100 mL via INTRAVENOUS

## 2022-09-07 ENCOUNTER — Telehealth: Payer: Self-pay | Admitting: Family Medicine

## 2022-09-07 NOTE — Telephone Encounter (Signed)
 Pt called and was upset that no one had called him back at 2 about the phone call that was left for him about a AW[facility] appt, pt said he dont not need a wellness appt,he was also upset that robin did not call him right back at 2 pm I said she may have been busy right at that moment and have not got a chance to call him back, I told him he had to call robin to schedule that appt and he was not happy about that, pt was very rude, and he is always like that, said he did not want to do that visit, I told him that he did not have to that was totally up to him, said he would talk about whatever he needed to in April,

## 2022-09-07 NOTE — Telephone Encounter (Signed)
Contacted James Dunn to schedule their annual wellness visit. Appointment made for 09/13/22.  James Dunn [facility] direct phone # [phone]

## 2022-09-07 NOTE — Telephone Encounter (Signed)
Contacted James Dunn to schedule their annual wellness visit. Call back at later date: 09/07/22 after [place] [facility] direct phone # [phone]

## 2022-09-08 ENCOUNTER — Other Ambulatory Visit: Payer: Self-pay

## 2022-09-08 ENCOUNTER — Inpatient Hospital Stay (HOSPITAL_BASED_OUTPATIENT_CLINIC_OR_DEPARTMENT_OTHER): Payer: PPO | Admitting: Hematology

## 2022-09-08 ENCOUNTER — Encounter: Payer: Self-pay | Admitting: Hematology

## 2022-09-08 ENCOUNTER — Telehealth: Payer: Self-pay

## 2022-09-08 DIAGNOSIS — C155 Malignant neoplasm of lower third of esophagus: Secondary | ICD-10-CM

## 2022-09-08 NOTE — Progress Notes (Signed)
[place]   Telephone:([phone]) [phone] Fax:(336) [phone]   Clinic Follow up Note   Patient Care Team: Denita Lung, MD as PCP - General (Family Medicine) [name], MD as Consulting Physician (Gastroenterology) [name], MD as Consulting Physician (Hematology) Minus Breeding, MD as Consulting Physician (Cardiology)  Date of Service:  09/08/2022  I connected with James Dunn on 09/08/2022 at  4:00 PM EDT by telephone visit and verified that I am speaking with the correct person using two identifiers.  I discussed the limitations, risks, security and privacy concerns of performing an evaluation and management service by telephone and the availability of in person appointments. I also discussed with the patient that there may be a patient responsible charge related to this service. The patient expressed understanding and agreed to proceed.   Other persons participating in the visit and their role in the encounter:  No  Patient's location:  Home Provider's location:  Office  CHIEF COMPLAINT: f/u of esophageal cancer   CURRENT THERAPY:  Surveillance   ASSESSMENT & PLAN:  James Dunn is a 72 y.o. male with    1. Adenocarcinoma of distal esophagus, T3N2M0, stage IVA, ypT0N0  -His 08/30/19 EGD showed a large, fungating, partially circumferential mass in the lower third of the esophagus causing dysphagia. His PET from 09/20/19 showed no evidence of metastatic disease.  -His EUS from 09/13/19 which showed locally advanced adenocarcinoma invading the muscular layer with 2 enlarged LNs, T3N2M0. -He completed standard neoadjuvant concurrent ChemoRT with weekly Carboplatin and Taxol for 6 weeks 09/30/19 - 11/06/19.  -He proceeded with surgical resection by Dr Servando Snare on 01/01/20. Surgical path showed complete response to neoadjuvant chemoRT with no residual disease in surgery.  -He is currently on surveillance. His CEA showed a jump to 19.85 in 11/2021, stable at 18.56 on  01/06/22.  -I reviewed his surveillance CT scan from September 05, 2022, which showed no evidence of recurrence.  I discussed the incidental findings also. -I also reviewed his recent lab, which showed elevated CEA at 23, higher than previously 18-19 in the past 9 months.  He is clinically doing well, no clinical concern for recurrence.  He has quit smoking for several years.  His last EGD was in August 2023, he is overdue for colonoscopy, last one in June 2018.  I recommended him to have a repeated colonoscopy, to rule out new GI malignancy.  He is agreeable, I will reach out to Dr. [name].    2. H/o Tobacco and alcohol use -He has long term tobacco abuse, quit in 03/2019.  -He noted he has cut out alcohol in 09/2019, still drinks some time. I recommend he continue cessation.   3. HTN, HL, DM, GERD  -Continue medications and f/u with PCP.        PLAN: - Recommend a colonoscopy -discuss CT scan and lab results, no clinical concern for recurrence. -Will refer him back to GI Dr. [name] -lab/flush,f/u in 6 months   SUMMARY OF ONCOLOGIC HISTORY: Oncology History Overview Note  Cancer Staging Esophageal cancer [facility]) Staging form: Esophagus - Adenocarcinoma, AJCC 8th Edition - Clinical stage from 09/13/2019: Stage IVA (cT3, cN2, cM0) - Signed by [name], MD on 09/22/2019 - Pathologic stage from 01/07/2020: Stage I (ypT0, pN0, cM0, G2) - Signed by [name], MD on 01/07/2020    Esophageal cancer ([place])  08/30/2019 Procedure   EGD by Dr. [name] 08/30/19  IMPRESSION - Partially obstructing, malignant esophageal tumor was found in  the lower third of the esophagus. Biopsied. Injected. - Normal stomach. - Normal examined duodenum.   08/30/2019 Initial Biopsy   FINAL MICROSCOPIC DIAGNOSIS:   A. ESOPHAGUS, BIOPSY:  - At least intramucosal adenocarcinoma.  - See comment.   COMMENT:   - The depth of invasion can not be determined due to the superficial  nature of the biopsy.  Dr.  Vic Ripper  has reviewed the case and concurs  with this interpretation.  Additional studies can be performed upon  clinician request.    09/05/2019 Imaging   CT CAP W Contrast 09/05/19  IMPRESSION: 1. Soft tissue mass noted distal esophagus, compatible with known neoplasm. There is a small 6 mm short axis adjacent paraesophageal node, concerning for metastatic disease. 2. Upper normal to mildly enlarged hepatoduodenal ligament lymph node, with upper normal right para-aortic nodes. As metastatic disease cannot be excluded, PET-CT may prove helpful to further evaluate. 3. 1.9 x 1.3 cm nodular collection of soft tissue posterior to the descending duodenum. This is in close proximity to the pancreatic head but a definite communication of parenchyma between these 2 structures is not discernible by CT. 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 Attention on follow-up recommended. 4. 6 mm perifissural nodule right middle lobe, likely a subpleural lymph node. Attention on follow-up recommended. 5. Hepatomegaly with hepatic steatosis. 6. Ascending thoracic aorta measures 4 cm diameter. Recommend annual imaging followup by CTA or MRA. This recommendation follows 2010 ACCF/AHA/AATS/ACR/ASA/SCA/SCAI/SIR/STS/SVM Guidelines for the Diagnosis and Management of Patients with Thoracic Aortic Disease. Circulation. 2010; 121: W098-J191. Aortic aneurysm NOS (ICD10-I71.9) 7. Left-sided IVC, normal variant.   09/11/2019 Tumor Marker   Baseline CEA 25.12 on 09/11/19   09/13/2019 Cancer Staging   Staging form: Esophagus - Adenocarcinoma, AJCC 8th Edition - Clinical stage from 09/13/2019: Stage IVA (cT3, cN2, cM0) - Signed by [name], MD on 09/22/2019   09/13/2019 Procedure   EUS by Dr. [name] 09/13/19  IMPRESSION - A mass was found in the lower third of the esophagus. A tissue diagnosis was obtained prior to this exam. This is of adenocarcinoma. This was staged T3 N3 Mx by endosonographic  criteria. - No specimens collected.   09/20/2019 PET scan   PET IMPRESSION: 1. Hypermetabolic distal esophageal primary. 2. Low-level hypermetabolism within upper abdominal nodes. These are technically nonspecific, but favored to be reactive in the setting of hepatic steatosis and hepatomegaly. 3. Otherwise, no evidence of metastatic disease. 4. Anal hypermetabolism could be physiologic. Consider correlation with physical exam and colonoscopy, if patient is not up-to-date. 5. Incidental findings, including: Aortic atherosclerosis (ICD10-I70.0), coronary artery atherosclerosis and emphysema (ICD10-J43.9). Sinus disease and bilateral mastoid effusions.   09/22/2019 Initial Diagnosis   Esophageal cancer ([place])   09/30/2019 - 11/04/2019 Chemotherapy   concurrent ChemoRT with weekly Carboplatin and Taxol starting 09/30/19. Last dose was Taxol alone given neutropenia.    09/30/2019 - 11/06/2019 Radiation Therapy   concurrent ChemoRT with Dr. Lisbeth Renshaw starting 09/30/19-11/06/19   12/05/2019 Imaging   CT CAP w Contrast  IMPRESSION: 1. Interval response to therapy. Approximately 50% reduction in size of distal esophageal mass 2. No new or progressive disease identified within the chest or abdomen. 3. Emphysema and aortic atherosclerosis. 4. Left main and 3 vessel coronary artery calcifications.   Aortic Atherosclerosis (ICD10-I70.0) and Emphysema (ICD10-J43.9).     01/01/2020 Surgery   VIDEO BRONCHOSCOPY WITH BRONCHIAL WASHING FOR CULTURES AND GRAM STAIN and TRANSHIATAL TOTAL ESOPHAGECTOMY COMPLETE WITH NIMS TUBE and JEJUNOSTOMY and CHEST TUBE INSERTION  with Dr Servando Snare.    01/01/2020 Pathology Results   FINAL MICROSCOPIC DIAGNOSIS:   A. ESOPHAGUS AND PARTIAL STOMACH, ESOPHAGECTOMY:  - No residual carcinoma identified.  - Reactive changes, mixed inflammation, and ulcer.  - Sixteen of sixteen lymph nodes negative for carcinoma (0/16).  - See oncology table.    01/07/2020 Cancer Staging    Staging form: Esophagus - Adenocarcinoma, AJCC 8th Edition - Pathologic stage from 01/07/2020: Stage I (ypT0, pN0, cM0, G2) - Signed by [name], MD on 01/07/2020   08/05/2020 Imaging   CT CAP  IMPRESSION: 1. No evidence of thoracic metastasis. 2. Gastric pull-up anatomy without evidence of local recurrence. 3. No evidence of metastatic disease in the abdomen pelvis. 4. Moderate volume stool throughout the colon. 5. Aortic Atherosclerosis (ICD10-I70.0).     05/28/2021 Procedure   Upper GI Endoscopy, Dr. [name]  Impression: - Benign-appearing esophageal stenosis. Dilated. - A previous surgical anastomosis was found, characterized by healthy appearing mucosa. - Normal examined duodenum. - No specimens collected.   08/05/2021 Imaging   EXAM: CT CHEST, ABDOMEN, AND PELVIS WITH CONTRAST  IMPRESSION: 1. Stable postsurgical changes of prior gastric pull-through without new or progressive findings to suggest recurrent or metastatic disease within the chest, abdomen, or pelvis. 2. Hepatic steatosis. 3. Colonic diverticulosis without findings of acute diverticulitis. 4. Aortic Atherosclerosis (ICD10-I70.0) and Emphysema (ICD10-J43.9).      INTERVAL HISTORY:  James Dunn was contacted for a follow up of esophageal cancer .He was last seen by me on 04/13/2023.  Pt state that he is doing well. Pt state that his weight is still the same. Pt state that he plays golf 2-3 days a week. Pt state he has stop smoking 3 years ago. Pt state that he takes supplement for your eyes.   All other systems were reviewed with the patient and are negative.  MEDICAL HISTORY:  Past Medical History:  Diagnosis Date   Allergy    Asthma    Controlled type 2 diabetes mellitus without complication, without long-term current use of insulin ([place]) 06/25/2012   Diverticulosis    ED (erectile dysfunction) 12/28/2010   Esophageal cancer ([place]) 09/22/2019   Former smoker 06/25/2012   GERD (gastroesophageal reflux  disease) 2002   History of colonic polyps 05/11/2016   Hyperlipidemia associated with type 2 diabetes mellitus ([place]) 05/11/2016   Hypertension associated with diabetes ([place]) 08/02/2017   denies   Mild intermittent asthma without complication [id number]   Seasonal allergic rhinitis due to pollen 11/29/2016   Thoracic aortic aneurysm ([facility])    Ascending thoracic aorta measures 4 cm diameter   Tubular adenoma of colon 12/05/2016    SURGICAL HISTORY: Past Surgical History:  Procedure Laterality Date   BALLOON DILATION N/A 03/20/2020   Procedure: BALLOON DILATION;  Surgeon: [name], MD;  Location: [facility];  Service: Endoscopy;  Laterality: N/A;   BALLOON DILATION N/A 04/03/2020   Procedure: BALLOON DILATION;  Surgeon: [name], MD;  Location: [facility];  Service: Endoscopy;  Laterality: N/A;   BALLOON DILATION N/A 04/17/2020   Procedure: BALLOON DILATION;  Surgeon: [name], MD;  Location: [facility];  Service: Endoscopy;  Laterality: N/A;   BALLOON DILATION N/A 05/01/2020   Procedure: BALLOON DILATION;  Surgeon: [name], MD;  Location: [facility];  Service: Endoscopy;  Laterality: N/A;   BALLOON DILATION N/A 05/22/2020   Procedure: BALLOON DILATION;  Surgeon: [name], MD;  Location: [facility];  Service: Endoscopy;  Laterality: N/A;   BALLOON DILATION N/A  06/05/2020   Procedure: BALLOON DILATION;  Surgeon: [name], MD;  Location: [facility];  Service: Endoscopy;  Laterality: N/A;   BALLOON DILATION N/A 07/10/2020   Procedure: BALLOON DILATION;  Surgeon: [name], MD;  [facility]: [facility];  Service: Endoscopy;  Laterality: N/A;   BALLOON DILATION N/A 09/24/2020   Procedure: BALLOON DILATION;  Surgeon: [name], MD;  Location: [facility];  Service: Endoscopy;  Laterality: N/A;   BALLOON DILATION N/A 05/28/2021   Procedure: BALLOON DILATION;  Surgeon: [name], MD;  [facility]: [facility];  Service: Endoscopy;  Laterality: N/A;    BIOPSY  08/30/2019   Procedure: BIOPSY;  Surgeon: [name], MD;  Location: [facility];  Service: Endoscopy;;   CHEST TUBE INSERTION Left 01/01/2020   Procedure: CHEST TUBE INSERTION;  Surgeon: [name], MD;  Location: [place];  Service: Thoracic;  Laterality: Left;   COMPLETE ESOPHAGECTOMY N/A 01/01/2020   Procedure: TRANSHIATAL TOTAL ESOPHAGECTOMY COMPLETE WITH NIMS TUBE;  Surgeon: [name], MD;  Location: [place];  Service: Thoracic;  Laterality: N/A;   ESOPHAGOGASTRODUODENOSCOPY (EGD) WITH PROPOFOL N/A 08/30/2019   Procedure: ESOPHAGOGASTRODUODENOSCOPY (EGD) WITH PROPOFOL;  Surgeon: [name], MD;  [facility];  Service: Endoscopy;  Laterality: N/A;   ESOPHAGOGASTRODUODENOSCOPY (EGD) WITH PROPOFOL N/A 09/13/2019   Procedure: ESOPHAGOGASTRODUODENOSCOPY (EGD) WITH PROPOFOL;  Surgeon: [name], MD;  [facility];  Service: Endoscopy;  Laterality: N/A;   ESOPHAGOGASTRODUODENOSCOPY (EGD) WITH PROPOFOL N/A 03/20/2020   Procedure: ESOPHAGOGASTRODUODENOSCOPY (EGD) WITH PROPOFOL;  Surgeon: [name], MD;  [facility];  Service: Endoscopy;  Laterality: N/A;   ESOPHAGOGASTRODUODENOSCOPY (EGD) WITH PROPOFOL N/A 04/03/2020   Procedure: ESOPHAGOGASTRODUODENOSCOPY (EGD) WITH PROPOFOL;  Surgeon: [name], MD;  [facility];  Service: Endoscopy;  Laterality: N/A;   ESOPHAGOGASTRODUODENOSCOPY (EGD) WITH PROPOFOL N/A 04/17/2020   Procedure: ESOPHAGOGASTRODUODENOSCOPY (EGD) WITH PROPOFOL;  Surgeon: [name], MD;  [facility];  Service: Endoscopy;  Laterality: N/A;   ESOPHAGOGASTRODUODENOSCOPY (EGD) WITH PROPOFOL N/A 05/01/2020   Procedure: ESOPHAGOGASTRODUODENOSCOPY (EGD) WITH PROPOFOL;  Surgeon: [name], MD;  [facility];  Service: Endoscopy;  Laterality: N/A;   ESOPHAGOGASTRODUODENOSCOPY (EGD) WITH PROPOFOL N/A 05/22/2020   Procedure: ESOPHAGOGASTRODUODENOSCOPY (EGD) WITH PROPOFOL;  Surgeon:  [name], MD;  [facility];  Service: Endoscopy;  Laterality: N/A;   ESOPHAGOGASTRODUODENOSCOPY (EGD) WITH PROPOFOL N/A 06/05/2020   Procedure: ESOPHAGOGASTRODUODENOSCOPY (EGD) WITH PROPOFOL;  Surgeon: [name], MD;  [facility];  Service: Endoscopy;  Laterality: N/A;   ESOPHAGOGASTRODUODENOSCOPY (EGD) WITH PROPOFOL N/A 07/10/2020   Procedure: ESOPHAGOGASTRODUODENOSCOPY (EGD) WITH PROPOFOL;  Surgeon: [name], MD;  [facility];  Service: Endoscopy;  Laterality: N/A;   ESOPHAGOGASTRODUODENOSCOPY (EGD) WITH PROPOFOL N/A 09/24/2020   Procedure: ESOPHAGOGASTRODUODENOSCOPY (EGD) WITH PROPOFOL;  Surgeon: [name], MD;  [facility];  Service: Endoscopy;  Laterality: N/A;   ESOPHAGOGASTRODUODENOSCOPY (EGD) WITH PROPOFOL N/A 05/28/2021   Procedure: ESOPHAGOGASTRODUODENOSCOPY (EGD) WITH PROPOFOL;  Surgeon: [name], MD;  Location: [facility];  Service: Endoscopy;  Laterality: N/A;   JEJUNOSTOMY N/A 01/01/2020   Procedure: JEJUNOSTOMY;  Surgeon: [name], MD;  Location: [facility] OR;  Service: Thoracic;  Laterality: N/A;   shave  Left 09/27/2021   macular seborrheic keratosis, pigmented   SKIN BIOPSY N/A 03/04/2019   upper back Nerofibroma and follicular cyst    SUBMUCOSAL INJECTION  08/30/2019   Procedure: SUBMUCOSAL INJECTION;  Surgeon: [name], MD;  Location: [facility];  Service: Endoscopy;;   UPPER ESOPHAGEAL ENDOSCOPIC ULTRASOUND (EUS) N/A 09/13/2019   Procedure:  UPPER ESOPHAGEAL ENDOSCOPIC ULTRASOUND (EUS);  Surgeon: [name], MD;  Location: [facility];  Service: Endoscopy;  Laterality: N/A;   VIDEO BRONCHOSCOPY N/A 01/01/2020   Procedure: VIDEO BRONCHOSCOPY WITH BRONCHIAL WASHING FOR CULTURES AND GRAM STAIN;  Surgeon: [name], MD;  Location: [place];  Service: Thoracic;  Laterality: N/A;    I have reviewed the social history and family history with the patient and they are unchanged from previous  note.  ALLERGIES:  has No Known Allergies.  MEDICATIONS:  Current Outpatient Medications  Medication Sig Dispense Refill   albuterol (VENTOLIN HFA) 108 (90 Base) MCG/ACT inhaler Inhale 1 puff into the lungs every 6 (six) hours as needed for wheezing or shortness of breath. (Patient not taking: Reported on 03/28/2022)     atorvastatin (LIPITOR) 20 MG tablet TAKE 1 TABLET BY MOUTH EVERY DAY 90 tablet 1   Azelaic Acid 15 % gel Apply 1 application topically daily as needed (Rosacea). (Patient not taking: Reported on 10/21/2021)     budesonide-formoterol (SYMBICORT) 160-4.5 MCG/ACT inhaler Inhale 2 puffs twice a day by inhalation route for 30 days. (Patient not taking: Reported on 03/28/2022)     esomeprazole (NEXIUM) 40 MG capsule TAKE 1 CAPSULE BY MOUTH EVERY DAY 90 capsule 1   metFORMIN (GLUCOPHAGE) 500 MG tablet TAKE 1 TABLET BY MOUTH TWICE A DAY WITH FOOD 180 tablet 0   Misc Natural Products (ESSIAC TONIC) LIQD Take 2 oz by mouth daily. Herbal Tea     Multiple Vitamins-Minerals (MULTIVITAMIN WITH MINERALS) tablet Take 1 tablet by mouth daily.     No current facility-administered medications for this visit.    PHYSICAL EXAMINATION: ECOG PERFORMANCE STATUS: 0 - Asymptomatic  There were no vitals filed for this visit. Wt Readings from Last 3 Encounters:  04/12/22 154 lb 8 oz (70.1 kg)  03/28/22 156 lb 3.2 oz (70.9 kg)  12/16/21 156 lb 6.4 oz (70.9 kg)     No vitals taken today, Exam not performed today  LABORATORY DATA:  I have reviewed the data as listed    Latest Ref Rng & Units 09/05/2022    8:19 AM 04/12/2022    1:36 PM 12/07/2021    8:13 AM  CBC  WBC 4.0 - 10.5 K/uL 4.5  4.9  5.1   Hemoglobin 13.0 - 17.0 g/dL 12.5  12.2  12.6   Hematocrit 39.0 - 52.0 % 36.5  35.8  36.3   Platelets 150 - 400 K/uL 175  197  195         Latest Ref Rng & Units 09/05/2022    8:19 AM 04/12/2022    1:36 PM 01/25/2022    7:50 AM  CMP  Glucose 70 - 99 mg/dL 101  115    BUN 8 - 23 mg/dL 8  11     Creatinine 0.61 - 1.24 mg/dL 1.04  1.03  1.20   Sodium 135 - 145 mmol/L 140  139    Potassium 3.5 - 5.1 mmol/L 4.6  4.6    Chloride 98 - 111 mmol/L 105  105    CO2 22 - 32 mmol/L 29  29    Calcium 8.9 - 10.3 mg/dL 9.2  9.1    Total Protein 6.5 - 8.1 g/dL 7.2  7.0    Total Bilirubin 0.3 - 1.2 mg/dL 0.8  0.5    Alkaline Phos 38 - 126 U/L 44  47    AST 15 - 41 U/L 24  23    ALT 0 -  44 U/L 24  22        RADIOGRAPHIC STUDIES: I have personally reviewed the radiological images as listed and agreed with the findings in the report. No results found.    No orders of the defined types were placed in this encounter.  All questions were answered. The patient knows to call the clinic with any problems, questions or concerns. No barriers to learning was detected. The total time spent in the appointment was 22 minutes.     [name], MD 09/08/2022   Felicity Coyer am acting as scribe for [name], MD.   I have reviewed the above documentation for accuracy and completeness, and I agree with the above.

## 2022-09-08 NOTE — Telephone Encounter (Signed)
Spoke with James Dunn via telephone to inform James Dunn that Dr. [name] needs to reschedule his appt today.  James Dunn was agreeable with rescheduling appt today but would like to know why his CEA lab is steadily increasing.  James Dunn stated his CT Scan looks good but does not understand why his CEA is elevated.  Informed James Dunn that this RN will make Dr. [name] aware of his concerns.

## 2022-09-09 ENCOUNTER — Telehealth: Payer: Self-pay | Admitting: Hematology

## 2022-09-09 ENCOUNTER — Ambulatory Visit: Payer: PPO | Admitting: Hematology

## 2022-09-09 NOTE — Telephone Encounter (Signed)
Contacted patient to scheduled appointments. Patient is aware of appointments that are scheduled.   

## 2022-09-12 ENCOUNTER — Ambulatory Visit: Payer: PPO | Admitting: Hematology

## 2022-09-12 DIAGNOSIS — K219 Gastro-esophageal reflux disease without esophagitis: Secondary | ICD-10-CM | POA: Diagnosis not present

## 2022-09-12 DIAGNOSIS — K222 Esophageal obstruction: Secondary | ICD-10-CM | POA: Diagnosis not present

## 2022-09-12 DIAGNOSIS — Z86010 Personal history of colonic polyps: Secondary | ICD-10-CM | POA: Diagnosis not present

## 2022-09-13 ENCOUNTER — Ambulatory Visit (INDEPENDENT_AMBULATORY_CARE_PROVIDER_SITE_OTHER): Payer: PPO

## 2022-09-13 VITALS — Ht 67.0 in | Wt 156.0 lb

## 2022-09-13 DIAGNOSIS — Z0000 Encounter for general adult medical examination without abnormal findings: Secondary | ICD-10-CM | POA: Diagnosis not present

## 2022-09-13 NOTE — Patient Instructions (Signed)
James Dunn , Thank you for taking time to come for your Medicare Wellness Visit. I appreciate your ongoing commitment to your health goals. Please review the following plan we discussed and let me know if I can assist you in the future.   These are the goals we discussed:  Goals      Patient Stated     09/13/2022, no goals        This is a list of the screening recommended for you and due dates:  Health Maintenance  Topic Date Due   Pneumonia Vaccine (3 of 3 - PPSV23 or PCV20) 05/11/2021   COVID-19 Vaccine (7 - 2023-24 season) 05/26/2022   Yearly kidney health urinalysis for diabetes  09/07/2022   Complete foot exam   09/07/2022   Hemoglobin A1C  09/27/2022   Eye exam for diabetics  05/18/2023   Yearly kidney function blood test for diabetes  09/05/2023   Medicare Annual Wellness Visit  09/13/2023   Colon Cancer Screening  12/01/2026   DTaP/Tdap/Td vaccine (4 - Td or Tdap) 07/07/2029   Flu Shot  Completed   Hepatitis C Screening: USPSTF Recommendation to screen - Ages 18-79 yo.  Completed   Zoster (Shingles) Vaccine  Completed   HPV Vaccine  Aged Out    Advanced directives: copy in chart  Conditions/risks identified: none  Next appointment: Follow up in one year for your annual wellness visit.   Preventive Care 23 Years and Older, Male  Preventive care refers to lifestyle choices and visits with your health care provider that can promote health and wellness. What does preventive care include? A yearly physical exam. This is also called an annual well check. Dental exams once or twice a year. Routine eye exams. Ask your health care provider how often you should have your eyes checked. Personal lifestyle choices, including: Daily care of your teeth and gums. Regular physical activity. Eating a healthy diet. Avoiding tobacco and drug use. Limiting alcohol use. Practicing safe sex. Taking low doses of aspirin every day. Taking vitamin and mineral supplements as  recommended by your health care provider. What happens during an annual well check? The services and screenings done by your health care provider during your annual well check will depend on your age, overall health, lifestyle risk factors, and family history of disease. Counseling  Your health care provider may ask you questions about your: Alcohol use. Tobacco use. Drug use. Emotional well-being. Home and relationship well-being. Sexual activity. Eating habits. History of falls. Memory and ability to understand (cognition). Work and work Statistician. Screening  You may have the following tests or measurements: Height, weight, and BMI. Blood pressure. Lipid and cholesterol levels. These may be checked every 5 years, or more frequently if you are over 4 years old. Skin check. Lung cancer screening. You may have this screening every year starting at age 41 if you have a 30-pack-year history of smoking and currently smoke or have quit within the past 15 years. Fecal occult blood test (FOBT) of the stool. You may have this test every year starting at age 1. Flexible sigmoidoscopy or colonoscopy. You may have a sigmoidoscopy every 5 years or a colonoscopy every 10 years starting at age 75. Prostate cancer screening. Recommendations will vary depending on your family history and other risks. Hepatitis C blood test. Hepatitis B blood test. Sexually transmitted disease (STD) testing. Diabetes screening. This is done by checking your blood sugar (glucose) after you have not eaten for a while (fasting). You  may have this done every 1-3 years. Abdominal aortic aneurysm (AAA) screening. You may need this if you are a current or former smoker. Osteoporosis. You may be screened starting at age 18 if you are at high risk. Talk with your health care provider about your test results, treatment options, and if necessary, the need for more tests. Vaccines  Your health care provider may recommend  certain vaccines, such as: Influenza vaccine. This is recommended every year. Tetanus, diphtheria, and acellular pertussis (Tdap, Td) vaccine. You may need a Td booster every 10 years. Zoster vaccine. You may need this after age 3. Pneumococcal 13-valent conjugate (PCV13) vaccine. One dose is recommended after age 34. Pneumococcal polysaccharide (PPSV23) vaccine. One dose is recommended after age 28. Talk to your health care provider about which screenings and vaccines you need and how often you need them. This information is not intended to replace advice given to you by your health care provider. Make sure you discuss any questions you have with your health care provider. Document Released: 07/03/2015 Document Revised: 02/24/2016 Document Reviewed: 04/07/2015 Elsevier Interactive Patient Education  2017 [place] Prevention in the Home Falls can cause injuries. They can happen to people of all ages. There are many things you can do to make your home safe and to help prevent falls. What can I do on the outside of my home? Regularly fix the edges of walkways and driveways and fix any cracks. Remove anything that might make you trip as you walk through a door, such as a raised step or threshold. Trim any bushes or trees on the path to your home. Use bright outdoor lighting. Clear any walking paths of anything that might make someone trip, such as rocks or tools. Regularly check to see if handrails are loose or broken. Make sure that both sides of any steps have handrails. Any raised decks and porches should have guardrails on the edges. Have any leaves, snow, or ice cleared regularly. Use sand or salt on walking paths during winter. Clean up any spills in your garage right away. This includes oil or grease spills. What can I do in the bathroom? Use night lights. Install grab bars by the toilet and in the tub and shower. Do not use towel bars as grab bars. Use non-skid mats or  decals in the tub or shower. If you need to sit down in the shower, use a plastic, non-slip stool. Keep the floor dry. Clean up any water that spills on the floor as soon as it happens. Remove soap buildup in the tub or shower regularly. Attach bath mats securely with double-sided non-slip rug tape. Do not have throw rugs and other things on the floor that can make you trip. What can I do in the bedroom? Use night lights. Make sure that you have a light by your bed that is easy to reach. Do not use any sheets or blankets that are too big for your bed. They should not hang down onto the floor. Have a firm chair that has side arms. You can use this for support while you get dressed. Do not have throw rugs and other things on the floor that can make you trip. What can I do in the kitchen? Clean up any spills right away. Avoid walking on wet floors. Keep items that you use a lot in easy-to-reach places. If you need to reach something above you, use a strong step stool that has a grab bar. Keep electrical  cords out of the way. Do not use floor polish or wax that makes floors slippery. If you must use wax, use non-skid floor wax. Do not have throw rugs and other things on the floor that can make you trip. What can I do with my stairs? Do not leave any items on the stairs. Make sure that there are handrails on both sides of the stairs and use them. Fix handrails that are broken or loose. Make sure that handrails are as long as the stairways. Check any carpeting to make sure that it is firmly attached to the stairs. Fix any carpet that is loose or worn. Avoid having throw rugs at the top or bottom of the stairs. If you do have throw rugs, attach them to the floor with carpet tape. Make sure that you have a light switch at the top of the stairs and the bottom of the stairs. If you do not have them, ask someone to add them for you. What else can I do to help prevent falls? Wear shoes that: Do not  have high heels. Have rubber bottoms. Are comfortable and fit you well. Are closed at the toe. Do not wear sandals. If you use a stepladder: Make sure that it is fully opened. Do not climb a closed stepladder. Make sure that both sides of the stepladder are locked into place. Ask someone to hold it for you, if possible. Clearly mark and make sure that you can see: Any grab bars or handrails. First and last steps. Where the edge of each step is. Use tools that help you move around (mobility aids) if they are needed. These include: Canes. Walkers. Scooters. Crutches. Turn on the lights when you go into a dark area. Replace any light bulbs as soon as they burn out. Set up your furniture so you have a clear path. Avoid moving your furniture around. If any of your floors are uneven, fix them. If there are any pets around you, be aware of where they are. Review your medicines with your doctor. Some medicines can make you feel dizzy. This can increase your chance of falling. Ask your doctor what other things that you can do to help prevent falls. This information is not intended to replace advice given to you by your health care provider. Make sure you discuss any questions you have with your health care provider. Document Released: 04/02/2009 Document Revised: 11/12/2015 Document Reviewed: 07/11/2014 Elsevier Interactive Patient Education  2017 [employer].

## 2022-09-13 NOTE — Progress Notes (Signed)
I connected with  James Dunn on 09/13/22 by a audio enabled telemedicine application and verified that I am speaking with the correct person using two identifiers.  Patient Location: Home  Provider Location: Office/Clinic  I discussed the limitations of evaluation and management by telemedicine. The patient expressed understanding and agreed to proceed.  Subjective:   James Dunn is a 72 y.o. male who presents for Medicare Annual/Subsequent preventive examination.  Review of Systems     Cardiac Risk Factors include: advanced age (>73men, >93 women);diabetes mellitus;dyslipidemia;hypertension;male gender     Objective:    Today's Vitals   09/13/22 0841  Weight: 156 lb (70.8 kg)  Height: 5\' 7"  (1.702 m)   Body mass index is 24.43 kg/m.     09/13/2022    8:46 AM 09/06/2021   10:24 AM 05/28/2021    6:52 AM 09/24/2020    1:26 PM 07/20/2020    8:23 AM 07/10/2020    9:16 AM 06/05/2020    6:39 AM  Advanced Directives  Does Patient Have a Medical Advance Directive? Yes Yes Yes No Yes No Yes  Type of Paramedic of [place];Living will Living will Out of facility DNR (pink MOST or yellow form);[place];Living will  Living will  [place];Living will  Does patient want to make changes to medical advance directive?  No - Patient declined       Copy of [employer] in Chart? Yes - validated most recent copy scanned in chart (See row information)        Would patient like information on creating a medical advance directive?    No - Patient declined  No - Patient declined     Current Medications (verified) Outpatient Encounter Medications as of 09/13/2022  Medication Sig   atorvastatin (LIPITOR) 20 MG tablet TAKE 1 TABLET BY MOUTH EVERY DAY   esomeprazole (NEXIUM) 40 MG capsule TAKE 1 CAPSULE BY MOUTH EVERY DAY   metFORMIN (GLUCOPHAGE) 500 MG tablet TAKE 1 TABLET BY MOUTH TWICE A DAY WITH FOOD   Multiple  Vitamins-Minerals (MULTIVITAMIN WITH MINERALS) tablet Take 1 tablet by mouth daily.   albuterol (VENTOLIN HFA) 108 (90 Base) MCG/ACT inhaler Inhale 1 puff into the lungs every 6 (six) hours as needed for wheezing or shortness of breath. (Patient not taking: Reported on 03/28/2022)   Azelaic Acid 15 % gel Apply 1 application topically daily as needed (Rosacea). (Patient not taking: Reported on 10/21/2021)   budesonide-formoterol (SYMBICORT) 160-4.5 MCG/ACT inhaler Inhale 2 puffs twice a day by inhalation route for 30 days. (Patient not taking: Reported on 03/28/2022)   Misc Natural Products (ESSIAC TONIC) LIQD Take 2 oz by mouth daily. Herbal Tea (Patient not taking: Reported on 09/13/2022)   No facility-administered encounter medications on file as of 09/13/2022.    Allergies (verified) Patient has no known allergies.   History: Past Medical History:  Diagnosis Date   Allergy    Asthma    Controlled type 2 diabetes mellitus without complication, without long-term current use of insulin ([place]) 06/25/2012   Diverticulosis    ED (erectile dysfunction) 12/28/2010   Esophageal cancer ([place]) 09/22/2019   Former smoker 06/25/2012   GERD (gastroesophageal reflux disease) 2002   History of colonic polyps 05/11/2016   Hyperlipidemia associated with type 2 diabetes mellitus ([place]) 05/11/2016   Hypertension associated with diabetes ([place]) 08/02/2017   denies   Mild intermittent asthma without complication 99991111   Seasonal allergic rhinitis due to  pollen 11/29/2016   Thoracic aortic aneurysm ([facility])    Ascending thoracic aorta measures 4 cm diameter   Tubular adenoma of colon 12/05/2016   Past Surgical History:  Procedure Laterality Date   BALLOON DILATION N/A 03/20/2020   Procedure: BALLOON DILATION;  Surgeon: [name], MD;  Location: [facility];  Service: Endoscopy;  Laterality: N/A;   BALLOON DILATION N/A 04/03/2020   Procedure: BALLOON DILATION;  Surgeon: [name], MD;  [facility];   Service: Endoscopy;  Laterality: N/A;   BALLOON DILATION N/A 04/17/2020   Procedure: BALLOON DILATION;  Surgeon: [name], MD;  [facility];  Service: Endoscopy;  Laterality: N/A;   BALLOON DILATION N/A 05/01/2020   Procedure: BALLOON DILATION;  Surgeon: [name], MD;  [facility];  Service: Endoscopy;  Laterality: N/A;   BALLOON DILATION N/A 05/22/2020   Procedure: BALLOON DILATION;  Surgeon: [name], MD;  [facility];  Service: Endoscopy;  Laterality: N/A;   BALLOON DILATION N/A 06/05/2020   Procedure: BALLOON DILATION;  Surgeon: [name], MD;  [facility];  Service: Endoscopy;  Laterality: N/A;   BALLOON DILATION N/A 07/10/2020   Procedure: BALLOON DILATION;  Surgeon: [name], MD;  [facility];  Service: Endoscopy;  Laterality: N/A;   BALLOON DILATION N/A 09/24/2020   Procedure: BALLOON DILATION;  Surgeon: [name], MD;  [facility];  Service: Endoscopy;  Laterality: N/A;   BALLOON DILATION N/A 05/28/2021   Procedure: BALLOON DILATION;  Surgeon: [name], MD;  [facility];  Service: Endoscopy;  Laterality: N/A;   BIOPSY  08/30/2019   Procedure: BIOPSY;  Surgeon: [name], MD;  [facility];  Service: Endoscopy;;   CHEST TUBE INSERTION Left 01/01/2020   Procedure: CHEST TUBE INSERTION;  Surgeon: [name], MD;  Location: [facility];  Service: Thoracic;  Laterality: Left;   COMPLETE ESOPHAGECTOMY N/A 01/01/2020   Procedure: TRANSHIATAL TOTAL ESOPHAGECTOMY COMPLETE WITH NIMS TUBE;  Surgeon: [name], MD;  Location: [facility];  Service: Thoracic;  Laterality: N/A;   ESOPHAGOGASTRODUODENOSCOPY (EGD) WITH PROPOFOL N/A 08/30/2019   Procedure: ESOPHAGOGASTRODUODENOSCOPY (EGD) WITH PROPOFOL;  Surgeon: [name], MD;  [facility];  Service: Endoscopy;  Laterality: N/A;   ESOPHAGOGASTRODUODENOSCOPY (EGD) WITH PROPOFOL N/A 09/13/2019   Procedure:  ESOPHAGOGASTRODUODENOSCOPY (EGD) WITH PROPOFOL;  Surgeon: [name], MD;  [facility];  Service: Endoscopy;  Laterality: N/A;   ESOPHAGOGASTRODUODENOSCOPY (EGD) WITH PROPOFOL N/A 03/20/2020   Procedure: ESOPHAGOGASTRODUODENOSCOPY (EGD) WITH PROPOFOL;  Surgeon: [name], MD;  [facility];  Service: Endoscopy;  Laterality: N/A;   ESOPHAGOGASTRODUODENOSCOPY (EGD) WITH PROPOFOL N/A 04/03/2020   Procedure: ESOPHAGOGASTRODUODENOSCOPY (EGD) WITH PROPOFOL;  Surgeon: [name], MD;  [facility];  Service: Endoscopy;  Laterality: N/A;   ESOPHAGOGASTRODUODENOSCOPY (EGD) WITH PROPOFOL N/A 04/17/2020   Procedure: ESOPHAGOGASTRODUODENOSCOPY (EGD) WITH PROPOFOL;  Surgeon: [name], MD;  [facility];  Service: Endoscopy;  Laterality: N/A;   ESOPHAGOGASTRODUODENOSCOPY (EGD) WITH PROPOFOL N/A 05/01/2020   Procedure: ESOPHAGOGASTRODUODENOSCOPY (EGD) WITH PROPOFOL;  Surgeon: [name], MD;  [facility];  Service: Endoscopy;  Laterality: N/A;   ESOPHAGOGASTRODUODENOSCOPY (EGD) WITH PROPOFOL N/A 05/22/2020   Procedure: ESOPHAGOGASTRODUODENOSCOPY (EGD) WITH PROPOFOL;  Surgeon: [name], MD;  [facility];  Service: Endoscopy;  Laterality: N/A;   ESOPHAGOGASTRODUODENOSCOPY (EGD) WITH PROPOFOL N/A 06/05/2020   Procedure: ESOPHAGOGASTRODUODENOSCOPY (EGD) WITH PROPOFOL;  Surgeon: [name], MD;  [facility];  Service: Endoscopy;  Laterality: N/A;   ESOPHAGOGASTRODUODENOSCOPY (EGD) WITH PROPOFOL N/A 07/10/2020  Procedure: ESOPHAGOGASTRODUODENOSCOPY (EGD) WITH PROPOFOL;  Surgeon: [name], MD;  [facility];  Service: Endoscopy;  Laterality: N/A;   ESOPHAGOGASTRODUODENOSCOPY (EGD) WITH PROPOFOL N/A 09/24/2020   Procedure: ESOPHAGOGASTRODUODENOSCOPY (EGD) WITH PROPOFOL;  Surgeon: [name], MD;  [facility];  Service: Endoscopy;  Laterality: N/A;   ESOPHAGOGASTRODUODENOSCOPY (EGD) WITH  PROPOFOL N/A 05/28/2021   Procedure: ESOPHAGOGASTRODUODENOSCOPY (EGD) WITH PROPOFOL;  Surgeon: [name], MD;  [facility];  Service: Endoscopy;  Laterality: N/A;   JEJUNOSTOMY N/A 01/01/2020   Procedure: JEJUNOSTOMY;  Surgeon: [name], MD;  Location: [facility];  Service: Thoracic;  Laterality: N/A;   shave  Left 09/27/2021   macular seborrheic keratosis, pigmented   SKIN BIOPSY N/A 03/04/2019   upper back Nerofibroma and follicular cyst    SUBMUCOSAL INJECTION  08/30/2019   Procedure: SUBMUCOSAL INJECTION;  Surgeon: [name], MD;  [facility];  Service: Endoscopy;;   UPPER ESOPHAGEAL ENDOSCOPIC ULTRASOUND (EUS) N/A 09/13/2019   Procedure: UPPER ESOPHAGEAL ENDOSCOPIC ULTRASOUND (EUS);  Surgeon: [name], MD;  Location: [facility];  Service: Endoscopy;  Laterality: N/A;   VIDEO BRONCHOSCOPY N/A 01/01/2020   Procedure: VIDEO BRONCHOSCOPY WITH BRONCHIAL WASHING FOR CULTURES AND GRAM STAIN;  Surgeon: [name], MD;  Location[facility];  Service: Thoracic;  Laterality: N/A;   Family History  Problem Relation Age of Onset   Emphysema Mother    AAA (abdominal aortic aneurysm) Father    Social History   Socioeconomic History   Marital status: Married    Spouse name: Not on file   Number of children: Not on file   Years of education: Not on file   Highest education level: Not on file  Occupational History   Not on file  Tobacco Use   Smoking status: Former    Types: Cigarettes    Quit date: 03/21/2019    Years since quitting: 3.4   Smokeless tobacco: Never  Vaping Use   Vaping Use: Never used  Substance and Sexual Activity   Alcohol use: Not Currently    Alcohol/week: 7.0 standard drinks of alcohol    Types: 7 Cans of beer per week   Drug use: No   Sexual activity: Yes  Other Topics Concern   Not on file  Social History Narrative   Lives at home with wife.     Social Determinants of Health   Financial Resource Strain: Low  Risk  (09/13/2022)   Overall Financial Resource Strain (CARDIA)    Difficulty of Paying Living Expenses: Not hard at all  Food Insecurity: No Food Insecurity (09/13/2022)   Hunger Vital Sign    Worried About Running Out of Food in the Last Year: Never true    Ran Out of Food in the Last Year: Never true  Transportation Needs: No Transportation Needs (09/13/2022)   PRAPARE - Hydrologist (Medical): No    Lack of Transportation (Non-Medical): No  Physical Activity: Sufficiently Active (09/13/2022)   Exercise Vital Sign    Days of Exercise per Week: 3 days    Minutes of Exercise per Session: 90 min  Stress: No Stress Concern Present (09/13/2022)   [place]    Feeling of Stress : Not at all  Social Connections: Not on file    Tobacco Counseling Counseling given: Not Answered   Clinical Intake:  Pre-visit preparation completed: Yes  Pain : No/denies pain     Nutritional Status: BMI of  19-24  Normal Nutritional Risks: None Diabetes: Yes  How often do you need to have someone help you when you read instructions, pamphlets, or other written materials from your doctor or pharmacy?: 1 - Never  Diabetic? Yes Nutrition Risk Assessment:  Has the patient had any N/V/D within the last 2 months?  No  Does the patient have any non-healing wounds?  No  Has the patient had any unintentional weight loss or weight gain?  No   Diabetes:  Is the patient diabetic?  Yes  If diabetic, was a CBG obtained today?  No  Did the patient bring in their glucometer from home?  No  How often do you monitor your CBG's? Does not.   Financial Strains and Diabetes Management:  Are you having any financial strains with the device, your supplies or your medication? No .  Does the patient want to be seen by Chronic Care Management for management of their diabetes?  No  Would the patient like to be referred to a  Nutritionist or for Diabetic Management?  No   Diabetic Exams:  Diabetic Eye Exam: Completed 05/17/2022 Diabetic Foot Exam: Completed 09/06/2021   Interpreter Needed?: No  Information entered by :: NAllen LPN   Activities of Daily Living    09/13/2022    8:48 AM  In your present state of health, do you have any difficulty performing the following activities:  Hearing? 0  Vision? 0  Difficulty concentrating or making decisions? 0  Walking or climbing stairs? 0  Dressing or bathing? 0  Doing errands, shopping? 0  Preparing Food and eating ? N  Using the Toilet? N  In the past six months, have you accidently leaked urine? N  Do you have problems with loss of bowel control? N  Managing your Medications? N  Managing your Finances? N  Housekeeping or managing your Housekeeping? N    Patient Care Team: Denita Lung, MD as PCP - General (Family Medicine) [name], MD as Consulting Physician (Gastroenterology) [name], MD as Consulting Physician (Hematology) Minus Breeding, MD as Consulting Physician (Cardiology)  Indicate any recent Medical Services you may have received from other than [facility] providers in the past year (date may be approximate).     Assessment:   This is a routine wellness examination for [employer].  Hearing/Vision screen Vision Screening - Comments:: Regular eye exams, [facility]  Dietary issues and exercise activities discussed: Current Exercise Habits: Home exercise routine, Type of exercise: Other - see comments (golf), Time (Minutes): > 60, Frequency (Times/Week): 3, Weekly Exercise (Minutes/Week): 0   Goals Addressed             This Visit's Progress    Patient Stated       09/13/2022, no goals       Depression Screen    09/13/2022    8:47 AM 09/06/2021   10:24 AM 07/20/2020    8:24 AM 10/10/2018    8:12 AM 08/16/2016    4:23 PM 08/02/2016    3:58 PM 05/11/2016    8:56 AM  PHQ 2/9 Scores  PHQ - 2 Score 0 0 0 0 0 0 0  PHQ- 9  Score 0          Fall Risk    09/13/2022    8:47 AM 09/06/2021   10:24 AM 07/20/2020    8:24 AM 10/10/2018    8:12 AM 11/30/2017   10:00 AM  Fall Risk   Falls in the past  year? 0 0 0 0 No  Number falls in past yr: 0 0     Injury with Fall? 0 0     Risk for fall due to : No Fall Risks No Fall Risks     Follow up Falls prevention discussed;Education provided;Falls evaluation completed Falls evaluation completed       FALL RISK PREVENTION PERTAINING TO THE HOME:  Any stairs in or around the home? Yes  If so, are there any without handrails? No  Home free of loose throw rugs in walkways, pet beds, electrical cords, etc? Yes  Adequate lighting in your home to reduce risk of falls? Yes   ASSISTIVE DEVICES UTILIZED TO PREVENT FALLS:  Life alert? No  Use of a cane, walker or w/c? No  Grab bars in the bathroom? No  Shower chair or bench in shower? No  Elevated toilet seat or a handicapped toilet? No   TIMED UP AND GO:  Was the test performed? No .      Cognitive Function:        09/13/2022    8:48 AM  6CIT Screen  What Year? 0 points  What month? 0 points  What time? 3 points  Count back from 20 0 points  Months in reverse 0 points  Repeat phrase 0 points  Total Score 3 points    Immunizations Immunization History  Administered Date(s) Administered   DTaP 11/21/1991   Fluad Quad(high Dose 65+) 02/26/2019, 03/16/2020, 02/24/2022   Influenza Split 06/28/2011   Influenza, High Dose Seasonal PF 05/21/2015, 03/30/2016, 04/04/2017, 05/22/2018   Influenza, Seasonal, Injecte, Preservative Fre 07/10/2012   Influenza,inj,Quad PF,6+ Mos 04/02/2013   Influenza-Unspecified 03/24/2021   Moderna Covid-19 Vaccine Bivalent Booster 10yrs & up 03/24/2021   Moderna Sars-Covid-2 Vaccination 08/04/2019, 09/02/2019, 02/24/2020, 10/29/2020   Pneumococcal Conjugate-13 05/11/2016   Pneumococcal Polysaccharide-23 01/17/2001   RSV,unspecified 03/31/2022   Tdap 12/03/2009, 07/08/2019    Unspecified SARS-COV-2 Vaccination 03/31/2022   Zoster Recombinat (Shingrix) 07/18/2017, 09/21/2017   Zoster, Live 11/21/2011, 07/18/2017    TDAP status: Up to date  Flu Vaccine status: Up to date  Pneumococcal vaccine status: Up to date  Covid-19 vaccine status: Completed vaccines  Qualifies for Shingles Vaccine? Yes   Zostavax completed Yes   Shingrix Completed?: Yes  Screening Tests Health Maintenance  Topic Date Due   Pneumonia Vaccine 44+ Years old (3 of 3 - PPSV23 or PCV20) 05/11/2021   COVID-19 Vaccine (7 - 2023-24 season) 05/26/2022   Diabetic kidney evaluation - Urine ACR  09/07/2022   Medicare Annual Wellness (AWV)  09/07/2022   FOOT EXAM  09/07/2022   HEMOGLOBIN A1C  09/27/2022   OPHTHALMOLOGY EXAM  05/18/2023   Diabetic kidney evaluation - eGFR measurement  09/05/2023   COLONOSCOPY (Pts 45-60yrs Insurance coverage will need to be confirmed)  12/01/2026   DTaP/Tdap/Td (4 - Td or Tdap) 07/07/2029   INFLUENZA VACCINE  Completed   Hepatitis C Screening  Completed   Zoster Vaccines- Shingrix  Completed   HPV VACCINES  Aged Out    Health Maintenance  Health Maintenance Due  Topic Date Due   Pneumonia Vaccine 68+ Years old (5 of 3 - PPSV23 or PCV20) 05/11/2021   COVID-19 Vaccine (7 - 2023-24 season) 05/26/2022   Diabetic kidney evaluation - Urine ACR  09/07/2022   Medicare Annual Wellness (AWV)  09/07/2022   FOOT EXAM  09/07/2022    Colorectal cancer screening: scheduled for 10/20/2022  Lung Cancer Screening: (Low Dose CT Chest recommended if  Age 71-80 years, 30 pack-year currently smoking OR have quit w/in 15years.) does not qualify.   Lung Cancer Screening Referral: no  Additional Screening:  Hepatitis C Screening: does qualify; Completed 05/11/2016  Vision Screening: Recommended annual ophthalmology exams for early detection of glaucoma and other disorders of the eye. Is the patient up to date with their annual eye exam?  Yes  Who is the provider or  what is the name of the office in which the patient attends annual eye exams? [facility] If pt is not established with a provider, would they like to be referred to a provider to establish care? No .   Dental Screening: Recommended annual dental exams for proper oral hygiene  Community Resource Referral / Chronic Care Management: CRR required this visit?  No   CCM required this visit?  No      Plan:     I have personally reviewed and noted the following in the patient's chart:   Medical and social history Use of alcohol, tobacco or illicit drugs  Current medications and supplements including opioid prescriptions. Patient is not currently taking opioid prescriptions. Functional ability and status Nutritional status Physical activity Advanced directives List of other physicians Hospitalizations, surgeries, and ER visits in previous 12 months Vitals Screenings to include cognitive, depression, and falls Referrals and appointments  In addition, I have reviewed and discussed with patient certain preventive protocols, quality metrics, and best practice recommendations. A written personalized care plan for preventive services as well as general preventive health recommendations were provided to patient.     Kellie Simmering, LPN   [id number]   Nurse Notes: none  Due to this being a virtual visit, the after visit summary with patients personalized plan was offered to patient via mail or my-chart.  Patient would like to access on my-chart

## 2022-10-05 ENCOUNTER — Other Ambulatory Visit: Payer: Self-pay | Admitting: Family Medicine

## 2022-10-06 ENCOUNTER — Encounter: Payer: Self-pay | Admitting: Family Medicine

## 2022-10-06 ENCOUNTER — Ambulatory Visit (INDEPENDENT_AMBULATORY_CARE_PROVIDER_SITE_OTHER): Payer: PPO | Admitting: Family Medicine

## 2022-10-06 VITALS — BP 130/66 | HR 52 | Temp 97.7°F | Resp 16 | Ht 67.5 in | Wt 154.0 lb

## 2022-10-06 DIAGNOSIS — E1169 Type 2 diabetes mellitus with other specified complication: Secondary | ICD-10-CM

## 2022-10-06 DIAGNOSIS — E119 Type 2 diabetes mellitus without complications: Secondary | ICD-10-CM | POA: Diagnosis not present

## 2022-10-06 DIAGNOSIS — I700 Atherosclerosis of aorta: Secondary | ICD-10-CM

## 2022-10-06 DIAGNOSIS — J439 Emphysema, unspecified: Secondary | ICD-10-CM | POA: Diagnosis not present

## 2022-10-06 DIAGNOSIS — C155 Malignant neoplasm of lower third of esophagus: Secondary | ICD-10-CM

## 2022-10-06 DIAGNOSIS — J301 Allergic rhinitis due to pollen: Secondary | ICD-10-CM

## 2022-10-06 DIAGNOSIS — J4520 Mild intermittent asthma, uncomplicated: Secondary | ICD-10-CM

## 2022-10-06 DIAGNOSIS — I152 Hypertension secondary to endocrine disorders: Secondary | ICD-10-CM

## 2022-10-06 DIAGNOSIS — Z86010 Personal history of colonic polyps: Secondary | ICD-10-CM

## 2022-10-06 DIAGNOSIS — E785 Hyperlipidemia, unspecified: Secondary | ICD-10-CM | POA: Diagnosis not present

## 2022-10-06 DIAGNOSIS — K219 Gastro-esophageal reflux disease without esophagitis: Secondary | ICD-10-CM

## 2022-10-06 LAB — POCT GLYCOSYLATED HEMOGLOBIN (HGB A1C): Hemoglobin A1C: 6.4 % — AB (ref 4.0–5.6)

## 2022-10-06 MED ORDER — ESOMEPRAZOLE MAGNESIUM 40 MG PO CPDR: 40.0000 mg | DELAYED_RELEASE_CAPSULE | Freq: Every day | ORAL | 3 refills | Status: DC

## 2022-10-06 MED ORDER — METFORMIN HCL 500 MG PO TABS: 500.0000 mg | ORAL_TABLET | Freq: Two times a day (BID) | ORAL | 1 refills | Status: DC

## 2022-10-06 MED ORDER — ATORVASTATIN CALCIUM 20 MG PO TABS: 20.0000 mg | ORAL_TABLET | Freq: Every day | ORAL | 1 refills | Status: DC

## 2022-10-06 MED ORDER — LISINOPRIL 5 MG PO TABS: 5.0000 mg | ORAL_TABLET | Freq: Every day | ORAL | 3 refills | Status: DC

## 2022-10-06 NOTE — Progress Notes (Signed)
Complete physical exam  Patient: James Dunn   DOB: [birth year]   72 y.o. Male  MRN: [id number]  Subjective:    Chief Complaint  Patient presents with   Annual Exam    Had AWV with [facility] on 09/13/2022. Fasting. No additional concerns.     James Dunn is a 72 y.o. male who presents today for a complete physical exam. He reports consuming a general diet.  Golf's 3-4 times a week  He generally feels well. He reports sleeping fairly well. He continues to be followed by oncology for his underlying esophageal cancer.  His most recent CEA was up slightly.  He is now scheduled to get a follow-up colonoscopy due to previous colonic polyps.  Recent CT scan did show evidence of emphysema as well as aortic atherosclerosis.  He continues on Nexium for his reflux symptoms and for protection from the previous cancer surgery.  Seasonal allergies seem to be under good control he rarely uses the asthma inhaler.  He does have ADD but that is not an issue at the present time.  He does continue on metformin and because of his underlying cancer history he is eating more frequent smaller meals.  Continues on atorvastatin is having no difficulty with that.  He is retired and enjoying his new lifestyle and is playing golf.  Most recent fall risk assessment:    09/13/2022    8:47 AM  Fall Risk   Falls in the past year? 0  Number falls in past yr: 0  Injury with Fall? 0  Risk for fall due to : No Fall Risks  Follow up Falls prevention discussed;Education provided;Falls evaluation completed     Most recent depression screenings:    09/13/2022    8:47 AM 09/06/2021   10:24 AM  PHQ 2/9 Scores  PHQ - 2 Score 0 0  PHQ- 9 Score 0     Vision:Within last year and Dental: Receives regular dental care    Patient Care Team: Ronnald Nian, MD as PCP - General (Family Medicine) Jeani Hawking, MD as Consulting Physician (Gastroenterology) Malachy Mood, MD as Consulting Physician (Hematology) Rollene Rotunda, MD as  Consulting Physician (Cardiology)   Outpatient Medications Prior to Visit  Medication Sig   Multiple Vitamins-Minerals (MULTIVITAMIN WITH MINERALS) tablet Take 1 tablet by mouth daily.   albuterol (VENTOLIN HFA) 108 (90 Base) MCG/ACT inhaler Inhale 1 puff into the lungs every 6 (six) hours as needed for wheezing or shortness of breath. (Patient not taking: Reported on 03/28/2022)   Azelaic Acid 15 % gel Apply 1 application topically daily as needed (Rosacea). (Patient not taking: Reported on 10/21/2021)   budesonide-formoterol (SYMBICORT) 160-4.5 MCG/ACT inhaler Inhale 2 puffs twice a day by inhalation route for 30 days. (Patient not taking: Reported on 03/28/2022)   Misc Natural Products (ESSIAC TONIC) LIQD Take 2 oz by mouth daily. Herbal Tea (Patient not taking: Reported on 09/13/2022)   [DISCONTINUED] atorvastatin (LIPITOR) 20 MG tablet TAKE 1 TABLET BY MOUTH EVERY DAY   [DISCONTINUED] esomeprazole (NEXIUM) 40 MG capsule TAKE 1 CAPSULE BY MOUTH EVERY DAY   [DISCONTINUED] metFORMIN (GLUCOPHAGE) 500 MG tablet TAKE 1 TABLET BY MOUTH TWICE A DAY WITH FOOD   No facility-administered medications prior to visit.    Review of Systems  All other systems reviewed and are negative.         Objective:     BP 130/66   Pulse (!) 52   Temp 97.7 F (36.5 C) (Oral)  Resp 16   Ht 5' 7.5" (1.715 m)   Wt 154 lb (69.9 kg)   SpO2 99% Comment: room air  BMI 23.76 kg/m    Physical Exam  Alert and in no distress. Tympanic membranes and canals are normal. Pharyngeal area is normal. Neck is supple without adenopathy or thyromegaly. Cardiac exam shows a regular sinus rhythm without murmurs or gallops. Lungs are clear to auscultation. Hemoglobin A1c is 6.4 Results for orders placed or performed in visit on 10/06/22  POCT glycosylated hemoglobin (Hb A1C)  Result Value Ref Range   Hemoglobin A1C 6.4 (A) 4.0 - 5.6 %       Assessment & Plan:   Controlled type 2 diabetes mellitus without  complication, without long-term current use of insulin - Plan: POCT glycosylated hemoglobin (Hb A1C), POCT UA - Microalbumin, metFORMIN (GLUCOPHAGE) 500 MG tablet, lisinopril (ZESTRIL) 5 MG tablet  Atherosclerosis of aorta - Plan: Lipid panel  Seasonal allergic rhinitis due to pollen  Malignant neoplasm of lower third of esophagus  History of colonic polyps  Mild intermittent asthma without complication  Hyperlipidemia associated with type 2 diabetes mellitus - Plan: atorvastatin (LIPITOR) 20 MG tablet  Gastroesophageal reflux disease, unspecified whether esophagitis present - Plan: esomeprazole (NEXIUM) 40 MG capsule  Pulmonary emphysema, unspecified emphysema type  Immunization History  Administered Date(s) Administered   DTaP 11/21/1991   Fluad Quad(high Dose 65+) 02/26/2019, 03/16/2020, 02/24/2022   Influenza Split 06/28/2011   Influenza, High Dose Seasonal PF 05/21/2015, 03/30/2016, 04/04/2017, 05/22/2018   Influenza, Seasonal, Injecte, Preservative Fre 07/10/2012   Influenza,inj,Quad PF,6+ Mos 04/02/2013   Influenza-Unspecified 03/24/2021   Moderna Covid-19 Vaccine Bivalent Booster 95yrs & up 03/24/2021   Moderna Sars-Covid-2 Vaccination 08/04/2019, 09/02/2019, 02/24/2020, 10/29/2020   Pneumococcal Conjugate-13 05/11/2016   Pneumococcal Polysaccharide-23 01/17/2001   RSV,unspecified 03/31/2022   Tdap 12/03/2009, 07/08/2019   Unspecified SARS-COV-2 Vaccination 03/31/2022   Zoster Recombinat (Shingrix) 07/18/2017, 09/21/2017   Zoster, Live 11/21/2011, 07/18/2017    Health Maintenance  Topic Date Due   Pneumonia Vaccine 83+ Years old (3 of 3 - PPSV23 or PCV20) 05/11/2021   COVID-19 Vaccine (7 - 2023-24 season) 05/26/2022   Diabetic kidney evaluation - Urine ACR  09/07/2022   FOOT EXAM  09/07/2022   INFLUENZA VACCINE  01/19/2023   HEMOGLOBIN A1C  04/07/2023   OPHTHALMOLOGY EXAM  05/18/2023   Diabetic kidney evaluation - eGFR measurement  09/05/2023   Medicare Annual  Wellness (AWV)  09/13/2023   COLONOSCOPY (Pts 45-23yrs Insurance coverage will need to be confirmed)  12/01/2026   DTaP/Tdap/Td (4 - Td or Tdap) 07/07/2029   Hepatitis C Screening  Completed   Zoster Vaccines- Shingrix  Completed   HPV VACCINES  Aged Out  I will add low-dose lisinopril to his regimen because of the underlying diabetes.  He will continue on his present medication regimen.  Discussed his blood sugar readings especially since are higher in the morning recommending he pail a bit more attention to them.  Continue to treat the allergies as needed.  Since he is really not having a lot of pulmonary symptoms sound like we need to pursue the emphysema diagnosis.  Discussed health benefits of physical activity, and encouraged him to engage in regular exercise appropriate for his age and condition.  Problem List Items Addressed This Visit     Atherosclerosis of aorta   Relevant Medications   atorvastatin (LIPITOR) 20 MG tablet   lisinopril (ZESTRIL) 5 MG tablet   Other Relevant Orders   Lipid panel  Controlled type 2 diabetes mellitus without complication, without long-term current use of insulin - Primary   Relevant Medications   metFORMIN (GLUCOPHAGE) 500 MG tablet   atorvastatin (LIPITOR) 20 MG tablet   lisinopril (ZESTRIL) 5 MG tablet   Other Relevant Orders   POCT glycosylated hemoglobin (Hb A1C) (Completed)   POCT UA - Microalbumin   Esophageal cancer   History of colonic polyps   Hyperlipidemia associated with type 2 diabetes mellitus   Relevant Medications   metFORMIN (GLUCOPHAGE) 500 MG tablet   atorvastatin (LIPITOR) 20 MG tablet   lisinopril (ZESTRIL) 5 MG tablet   Mild intermittent asthma without complication   Seasonal allergic rhinitis due to pollen   Other Visit Diagnoses     Gastroesophageal reflux disease, unspecified whether esophagitis present       Relevant Medications   esomeprazole (NEXIUM) 40 MG capsule   Pulmonary emphysema, unspecified emphysema  type         Approximately 45 minutes spent discussing all these issues with him. Follow-up 6 months     Sharlot Gowda, MD

## 2022-10-07 LAB — LIPID PANEL
Chol/HDL Ratio: 2.7 ratio (ref 0.0–5.0)
Cholesterol, Total: 139 mg/dL (ref 100–199)
HDL: 52 mg/dL (ref >39)
LDL Chol Calc (NIH): 70 mg/dL (ref 0–99)
Triglycerides: 89 mg/dL (ref 0–149)
VLDL Cholesterol Cal: 17 mg/dL (ref 5–40)

## 2022-10-07 MED ORDER — ATORVASTATIN CALCIUM 40 MG PO TABS: 40.0000 mg | ORAL_TABLET | Freq: Every day | ORAL | 3 refills | Status: DC

## 2022-10-07 NOTE — Addendum Note (Signed)
Addended by: Ronnald Nian on: 10/07/2022 07:55 AM   Modules accepted: Orders

## 2022-10-20 DIAGNOSIS — K5790 Diverticulosis of intestine, part unspecified, without perforation or abscess without bleeding: Secondary | ICD-10-CM | POA: Insufficient documentation

## 2022-10-20 DIAGNOSIS — K5730 Diverticulosis of large intestine without perforation or abscess without bleeding: Secondary | ICD-10-CM | POA: Diagnosis not present

## 2022-10-20 DIAGNOSIS — D125 Benign neoplasm of sigmoid colon: Secondary | ICD-10-CM | POA: Diagnosis not present

## 2022-10-20 DIAGNOSIS — Z86010 Personal history of colonic polyps: Secondary | ICD-10-CM | POA: Diagnosis not present

## 2022-10-20 DIAGNOSIS — K635 Polyp of colon: Secondary | ICD-10-CM | POA: Diagnosis not present

## 2022-10-20 DIAGNOSIS — D123 Benign neoplasm of transverse colon: Secondary | ICD-10-CM | POA: Diagnosis not present

## 2022-10-20 LAB — HM COLONOSCOPY

## 2022-10-24 LAB — HM COLONOSCOPY

## 2022-10-28 ENCOUNTER — Encounter: Payer: Self-pay | Admitting: Internal Medicine

## 2023-03-02 ENCOUNTER — Other Ambulatory Visit: Payer: Self-pay | Admitting: *Deleted

## 2023-03-02 ENCOUNTER — Telehealth: Payer: Self-pay | Admitting: Family Medicine

## 2023-03-02 DIAGNOSIS — H538 Other visual disturbances: Secondary | ICD-10-CM

## 2023-03-02 NOTE — Telephone Encounter (Signed)
Referral done

## 2023-03-02 NOTE — Telephone Encounter (Signed)
Pt stopped by with new shot records & wanted to see if you could refer him to eye doctor, said having issues with his eyes, said has a regular eye doctor that see's once a year & has a pair of glasses that he wears as needed but they don't seem to help his issues.  Said he called them and they said he's not due for his yearly appt yet so insurance won't pay for it.  And he's not sure what the issue with is eyes is.  Asked if blurry, dry eyes, film feeling or what & he says he just can't see and he has to go to [facility] soon & knows he won't pass his test because of his eyes.  Wants a referral

## 2023-03-08 ENCOUNTER — Other Ambulatory Visit (INDEPENDENT_AMBULATORY_CARE_PROVIDER_SITE_OTHER): Payer: PPO

## 2023-03-08 DIAGNOSIS — H02834 Dermatochalasis of left upper eyelid: Secondary | ICD-10-CM | POA: Diagnosis not present

## 2023-03-08 DIAGNOSIS — Z23 Encounter for immunization: Secondary | ICD-10-CM | POA: Diagnosis not present

## 2023-03-08 DIAGNOSIS — H04123 Dry eye syndrome of bilateral lacrimal glands: Secondary | ICD-10-CM | POA: Diagnosis not present

## 2023-03-08 DIAGNOSIS — H2513 Age-related nuclear cataract, bilateral: Secondary | ICD-10-CM | POA: Diagnosis not present

## 2023-03-08 DIAGNOSIS — E119 Type 2 diabetes mellitus without complications: Secondary | ICD-10-CM | POA: Diagnosis not present

## 2023-03-08 DIAGNOSIS — H02831 Dermatochalasis of right upper eyelid: Secondary | ICD-10-CM | POA: Diagnosis not present

## 2023-03-08 DIAGNOSIS — H5703 Miosis: Secondary | ICD-10-CM | POA: Diagnosis not present

## 2023-03-08 LAB — HM DIABETES EYE EXAM

## 2023-03-10 ENCOUNTER — Other Ambulatory Visit: Payer: Self-pay

## 2023-03-10 DIAGNOSIS — C155 Malignant neoplasm of lower third of esophagus: Secondary | ICD-10-CM

## 2023-03-12 NOTE — Assessment & Plan Note (Signed)
Adenocarcinoma of distal esophagus, T3N2M0, stage IVA, ypT0N0  -His 08/30/19 EGD showed a large, fungating, partially circumferential mass in the lower third of the esophagus causing dysphagia. His PET from 09/20/19 showed no evidence of metastatic disease.  -His EUS from 09/13/19 which showed locally advanced adenocarcinoma invading the muscular layer with 2 enlarged LNs, T3N2M0. -He completed standard neoadjuvant concurrent ChemoRT with weekly Carboplatin and Taxol for 6 weeks 09/30/19 - 11/06/19.  -He proceeded with surgical resection by Dr Tyrone Sage on 01/01/20. Surgical path showed complete response to neoadjuvant chemoRT with no residual disease in surgery.  -He is currently on surveillance. last CT in 08/2022 was negative.

## 2023-03-13 ENCOUNTER — Inpatient Hospital Stay: Payer: PPO

## 2023-03-13 ENCOUNTER — Inpatient Hospital Stay: Payer: PPO | Attending: Hematology | Admitting: Hematology

## 2023-03-13 ENCOUNTER — Encounter: Payer: Self-pay | Admitting: Hematology

## 2023-03-13 VITALS — BP 147/76 | HR 54 | Temp 98.3°F | Resp 18 | Ht 67.5 in | Wt 155.5 lb

## 2023-03-13 DIAGNOSIS — R970 Elevated carcinoembryonic antigen [CEA]: Secondary | ICD-10-CM | POA: Insufficient documentation

## 2023-03-13 DIAGNOSIS — E785 Hyperlipidemia, unspecified: Secondary | ICD-10-CM | POA: Insufficient documentation

## 2023-03-13 DIAGNOSIS — Z79899 Other long term (current) drug therapy: Secondary | ICD-10-CM | POA: Diagnosis not present

## 2023-03-13 DIAGNOSIS — E1136 Type 2 diabetes mellitus with diabetic cataract: Secondary | ICD-10-CM | POA: Diagnosis not present

## 2023-03-13 DIAGNOSIS — Z8501 Personal history of malignant neoplasm of esophagus: Secondary | ICD-10-CM | POA: Diagnosis not present

## 2023-03-13 DIAGNOSIS — Z7984 Long term (current) use of oral hypoglycemic drugs: Secondary | ICD-10-CM | POA: Insufficient documentation

## 2023-03-13 DIAGNOSIS — C155 Malignant neoplasm of lower third of esophagus: Secondary | ICD-10-CM | POA: Diagnosis not present

## 2023-03-13 LAB — CMP (CANCER CENTER ONLY)
ALT: 23 U/L (ref 0–44)
AST: 25 U/L (ref 15–41)
Albumin: 4.3 g/dL (ref 3.5–5.0)
Alkaline Phosphatase: 55 U/L (ref 38–126)
Anion gap: 6 (ref 5–15)
BUN: 9 mg/dL (ref 8–23)
CO2: 28 mmol/L (ref 22–32)
Calcium: 9.1 mg/dL (ref 8.9–10.3)
Chloride: 103 mmol/L (ref 98–111)
Creatinine: 1.06 mg/dL (ref 0.61–1.24)
GFR, Estimated: >60 mL/min (ref >60)
Glucose, Bld: 119 mg/dL — ABNORMAL HIGH (ref 70–99)
Potassium: 4.6 mmol/L (ref 3.5–5.1)
Sodium: 137 mmol/L (ref 135–145)
Total Bilirubin: 0.6 mg/dL (ref 0.3–1.2)
Total Protein: 7.2 g/dL (ref 6.5–8.1)

## 2023-03-13 LAB — CBC WITH DIFFERENTIAL (CANCER CENTER ONLY)
Abs Immature Granulocytes: 0.02 10*3/uL (ref 0.00–0.07)
Basophils Absolute: 0 10*3/uL (ref 0.0–0.1)
Basophils Relative: 1 %
Eosinophils Absolute: 0.2 10*3/uL (ref 0.0–0.5)
Eosinophils Relative: 3 %
HCT: 34.2 % — ABNORMAL LOW (ref 39.0–52.0)
Hemoglobin: 11.9 g/dL — ABNORMAL LOW (ref 13.0–17.0)
Immature Granulocytes: 0 %
Lymphocytes Relative: 24 %
Lymphs Abs: 1.3 10*3/uL (ref 0.7–4.0)
MCH: 33.3 pg (ref 26.0–34.0)
MCHC: 34.8 g/dL (ref 30.0–36.0)
MCV: 95.8 fL (ref 80.0–100.0)
Monocytes Absolute: 0.4 10*3/uL (ref 0.1–1.0)
Monocytes Relative: 8 %
Neutro Abs: 3.5 10*3/uL (ref 1.7–7.7)
Neutrophils Relative %: 64 %
Platelet Count: 212 10*3/uL (ref 150–400)
RBC: 3.57 MIL/uL — ABNORMAL LOW (ref 4.22–5.81)
RDW: 12.3 % (ref 11.5–15.5)
WBC Count: 5.3 10*3/uL (ref 4.0–10.5)
nRBC: 0 % (ref 0.0–0.2)

## 2023-03-13 LAB — CEA (ACCESS): CEA (CHCC): 16.42 ng/mL — ABNORMAL HIGH (ref 0.00–5.00)

## 2023-03-13 NOTE — Progress Notes (Signed)
[facility]   Telephone:(336) [phone] Fax:(336) [phone]   Clinic Follow up Note   Dunn Care Team: Ronnald Nian, MD as PCP - General (Family Medicine) Jeani Hawking, MD as Consulting Physician (Gastroenterology) Malachy Mood, MD as Consulting Physician (Hematology) Rollene Rotunda, MD as Consulting Physician (Cardiology)  Date of Service:  03/13/2023  CHIEF COMPLAINT: f/u of esophageal cancer  CURRENT THERAPY:  Cancer surveillance  Oncology History   Esophageal cancer [facility]) Adenocarcinoma of distal esophagus, T3N2M0, stage IVA, ypT0N0  -His 08/30/19 EGD showed a large, fungating, partially circumferential mass in James lower third of James esophagus causing dysphagia. His PET from 09/20/19 showed no evidence of metastatic disease.  -His EUS from 09/13/19 which showed locally advanced adenocarcinoma invading James muscular layer with 2 enlarged LNs, T3N2M0. -He completed standard neoadjuvant concurrent ChemoRT with weekly Carboplatin and Taxol for 6 weeks 09/30/19 - 11/06/19.  -He proceeded with surgical resection by Dr Tyrone Sage on 01/01/20. Surgical path showed complete response to neoadjuvant chemoRT with no residual disease in surgery.  -He is currently on surveillance. last CT in 08/2022 was negative.    Assessment and Plan    Esophageal Cancer -doing well. No current symptoms of dysphagia or aspiration. Last endoscopy in August 2023. -No need for repeat endoscopy at this time unless symptoms of dysphagia develop.  Cataracts Scheduled for cataract surgery on April 13, 2023. -Continue with planned surgery.  Hyperlipidemia On Atorvastatin. -Continue Atorvastatin.  Hypertension On Lisinopril. -Continue Lisinopril.  Diabetes Mellitus On Metformin. -Continue Metformin.  PLan --Order labs today. -lab and Follow-up appointment in 6 months.         SUMMARY OF ONCOLOGIC HISTORY: Oncology History Overview Note  Cancer Staging Esophageal cancer  [facility]) Staging form: Esophagus - Adenocarcinoma, AJCC 8th Edition - Clinical stage from 09/13/2019: Stage IVA (cT3, cN2, cM0) - Signed by Malachy Mood, MD on 09/22/2019 - Pathologic stage from 01/07/2020: Stage I (ypT0, pN0, cM0, G2) - Signed by Delight Ovens, MD on 01/07/2020    Esophageal cancer (HCC)  08/30/2019 Procedure   EGD by Dr. Elnoria Howard 08/30/19  IMPRESSION - Partially obstructing, malignant esophageal tumor was found in James lower third of James esophagus. Biopsied. Injected. - Normal stomach. - Normal examined duodenum.   08/30/2019 Initial Biopsy   FINAL MICROSCOPIC DIAGNOSIS:   A. ESOPHAGUS, BIOPSY:  - At least intramucosal adenocarcinoma.  - See comment.   COMMENT:   - James depth of invasion can not be determined due to James superficial  nature of James biopsy.  Dr.  Kenard Gower has reviewed James case and concurs  with this interpretation.  Additional studies can be performed upon  clinician request.    09/05/2019 Imaging   CT CAP W Contrast 09/05/19  IMPRESSION: 1. Soft tissue mass noted distal esophagus, compatible with known neoplasm. There is a small 6 mm short axis adjacent paraesophageal node, concerning for metastatic disease. 2. Upper normal to mildly enlarged hepatoduodenal ligament lymph node, with upper normal right para-aortic nodes. As metastatic disease cannot be excluded, PET-CT may prove helpful to further evaluate. 3. 1.9 x 1.3 cm nodular collection of soft tissue posterior to James descending duodenum. This is in close proximity to James pancreatic head but a definite communication of parenchyma between these 2 structures is not discernible by CT. 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 Attention on follow-up recommended. 4. 6 mm perifissural nodule right middle lobe, likely a subpleural lymph node. Attention on follow-up recommended. 5. Hepatomegaly with hepatic steatosis.  6. Ascending thoracic aorta measures 4 cm diameter. Recommend  annual imaging followup by CTA or MRA. This recommendation follows 2010 ACCF/AHA/AATS/ACR/ASA/SCA/SCAI/SIR/STS/SVM Guidelines for James Diagnosis and Management of Patients with Thoracic Aortic Disease. Circulation. 2010; 121: Z610-R604. Aortic aneurysm NOS (ICD10-I71.9) 7. Left-sided IVC, normal variant.   09/11/2019 Tumor Marker   Baseline CEA 25.12 on 09/11/19   09/13/2019 Cancer Staging   Staging form: Esophagus - Adenocarcinoma, AJCC 8th Edition - Clinical stage from 09/13/2019: Stage IVA (cT3, cN2, cM0) - Signed by Malachy Mood, MD on 09/22/2019   09/13/2019 Procedure   EUS by Dr. Elnoria Howard 09/13/19  IMPRESSION - A mass was found in James lower third of James esophagus. A tissue diagnosis was obtained prior to this exam. This is of adenocarcinoma. This was staged T3 N3 Mx by endosonographic criteria. - No specimens collected.   09/20/2019 PET scan   PET IMPRESSION: 1. Hypermetabolic distal esophageal primary. 2. Low-level hypermetabolism within upper abdominal nodes. These are technically nonspecific, but favored to be reactive in James setting of hepatic steatosis and hepatomegaly. 3. Otherwise, no evidence of metastatic disease. 4. Anal hypermetabolism could be physiologic. Consider correlation with physical exam and colonoscopy, if Dunn is not up-to-date. 5. Incidental findings, including: Aortic atherosclerosis (ICD10-I70.0), coronary artery atherosclerosis and emphysema (ICD10-J43.9). Sinus disease and bilateral mastoid effusions.   09/22/2019 Initial Diagnosis   Esophageal cancer (HCC)   09/30/2019 - 11/04/2019 Chemotherapy   concurrent ChemoRT with weekly Carboplatin and Taxol starting 09/30/19. Last dose was Taxol alone given neutropenia.    09/30/2019 - 11/06/2019 Radiation Therapy   concurrent ChemoRT with Dr. Mitzi Hansen starting 09/30/19-11/06/19   12/05/2019 Imaging   CT CAP w Contrast  IMPRESSION: 1. Interval response to therapy. Approximately 50% reduction in size of distal esophageal  mass 2. No new or progressive disease identified within James chest or abdomen. 3. Emphysema and aortic atherosclerosis. 4. Left main and 3 vessel coronary artery calcifications.   Aortic Atherosclerosis (ICD10-I70.0) and Emphysema (ICD10-J43.9).     01/01/2020 Surgery   VIDEO BRONCHOSCOPY WITH BRONCHIAL WASHING FOR CULTURES AND GRAM STAIN and TRANSHIATAL TOTAL ESOPHAGECTOMY COMPLETE WITH NIMS TUBE and JEJUNOSTOMY and CHEST TUBE INSERTION with Dr Tyrone Sage.    01/01/2020 Pathology Results   FINAL MICROSCOPIC DIAGNOSIS:   A. ESOPHAGUS AND PARTIAL STOMACH, ESOPHAGECTOMY:  - No residual carcinoma identified.  - Reactive changes, mixed inflammation, and ulcer.  - Sixteen of sixteen lymph nodes negative for carcinoma (0/16).  - See oncology table.    01/07/2020 Cancer Staging   Staging form: Esophagus - Adenocarcinoma, AJCC 8th Edition - Pathologic stage from 01/07/2020: Stage I (ypT0, pN0, cM0, G2) - Signed by Delight Ovens, MD on 01/07/2020   08/05/2020 Imaging   CT CAP  IMPRESSION: 1. No evidence of thoracic metastasis. 2. Gastric pull-up anatomy without evidence of local recurrence. 3. No evidence of metastatic disease in James abdomen pelvis. 4. Moderate volume stool throughout James colon. 5. Aortic Atherosclerosis (ICD10-I70.0).     05/28/2021 Procedure   Upper GI Endoscopy, Dr. Elnoria Howard  Impression: - Benign-appearing esophageal stenosis. Dilated. - A previous surgical anastomosis was found, characterized by healthy appearing mucosa. - Normal examined duodenum. - No specimens collected.   08/05/2021 Imaging   EXAM: CT CHEST, ABDOMEN, AND PELVIS WITH CONTRAST  IMPRESSION: 1. Stable postsurgical changes of prior gastric pull-through without new or progressive findings to suggest recurrent or metastatic disease within James chest, abdomen, or pelvis. 2. Hepatic steatosis. 3. Colonic diverticulosis without findings of acute diverticulitis. 4. Aortic Atherosclerosis (ICD10-I70.0)  and Emphysema (ICD10-J43.9).   09/05/2022 Imaging   CT CHEST ABDOMEN PELVIS W CONTRAST   IMPRESSION: 1. Stable changes post esophagectomy and gastric pull-through without evidence of local recurrence or metastatic disease within James chest, abdomen, or pelvis. 2. Hepatic steatosis. 3. Left-sided colonic diverticulosis without findings of acute diverticulitis. 4. Aortic Atherosclerosis (ICD10-I70.0) and Emphysema (ICD10-J43.9).        Discussed James use of AI scribe software for clinical note transcription with James Dunn, who gave verbal consent to proceed.  History of Present Illness   James Dunn, with a history of esophageal cancer, diabetes, hypertension, and hyperlipidemia, presents for a routine follow-up. They report no new health issues in James past six months, except for James development of cataracts for which they are scheduled for surgery. They occasionally experience heaviness in their stomach after eating too much, which is relieved by ginger ale and time. He deny any cough or shortness of breath. They sleep on a wedge in bed and quit smoking in 2020. They continue to consume alcohol sometime. He is on atorvastatin for cholesterol, metformin for diabetes, and lisinopril for hypertension. They use inhalers infrequently. They had a colonoscopy recently, which was clear. He had an endoscopy in August 2023 to stretch their throat due to scar tissue from previous surgery and radiation, but they deny any current swallowing difficulties.         All other systems were reviewed with James Dunn and are negative.  MEDICAL HISTORY:  Past Medical History:  Diagnosis Date   Allergy    Asthma    Controlled type 2 diabetes mellitus without complication, without long-term current use of insulin (HCC) 06/25/2012   Diverticulosis    ED (erectile dysfunction) 12/28/2010   Esophageal cancer (HCC) 09/22/2019   Former smoker 06/25/2012   GERD (gastroesophageal reflux disease) 2002   History of  colonic polyps 05/11/2016   Hyperlipidemia associated with type 2 diabetes mellitus (HCC) 05/11/2016   Hypertension associated with diabetes (HCC) 08/02/2017   denies   Mild intermittent asthma without complication 11/29/2016   Seasonal allergic rhinitis due to pollen 11/29/2016   Thoracic aortic aneurysm (HCC)    Ascending thoracic aorta measures 4 cm diameter   Tubular adenoma of colon 12/05/2016    SURGICAL HISTORY: Past Surgical History:  Procedure Laterality Date   BALLOON DILATION N/A 03/20/2020   Procedure: BALLOON DILATION;  Surgeon: Jeani Hawking, MD;  Location: [facility];  Service: Endoscopy;  Laterality: N/A;   BALLOON DILATION N/A 04/03/2020   Procedure: BALLOON DILATION;  Surgeon: Jeani Hawking, MD;  Location: [facility];  Service: Endoscopy;  Laterality: N/A;   BALLOON DILATION N/A 04/17/2020   Procedure: BALLOON DILATION;  Surgeon: Jeani Hawking, MD;  Location: [facility];  Service: Endoscopy;  Laterality: N/A;   BALLOON DILATION N/A 05/01/2020   Procedure: BALLOON DILATION;  Surgeon: Jeani Hawking, MD;  Location: [facility];  Service: Endoscopy;  Laterality: N/A;   BALLOON DILATION N/A 05/22/2020   Procedure: BALLOON DILATION;  Surgeon: Jeani Hawking, MD;  Location: [facility];  Service: Endoscopy;  Laterality: N/A;   BALLOON DILATION N/A 06/05/2020   Procedure: BALLOON DILATION;  Surgeon: Jeani Hawking, MD;  Location: [facility];  Service: Endoscopy;  Laterality: N/A;   BALLOON DILATION N/A 07/10/2020   Procedure: BALLOON DILATION;  Surgeon: Jeani Hawking, MD;  Location: [facility];  Service: Endoscopy;  Laterality: N/A;   BALLOON DILATION N/A 09/24/2020   Procedure: BALLOON DILATION;  Surgeon: Jeani Hawking, MD;  Location: [facility];  Service: Endoscopy;  Laterality: N/A;   BALLOON DILATION N/A 05/28/2021   Procedure: BALLOON DILATION;  Surgeon: Jeani Hawking, MD;  Location: [facility];  Service: Endoscopy;  Laterality: N/A;   BIOPSY  08/30/2019    Procedure: BIOPSY;  Surgeon: Jeani Hawking, MD;  Location: [facility];  Service: Endoscopy;;   CHEST TUBE INSERTION Left 01/01/2020   Procedure: CHEST TUBE INSERTION;  Surgeon: Delight Ovens, MD;  Location: [facility] OR;  Service: Thoracic;  Laterality: Left;   COMPLETE ESOPHAGECTOMY N/A 01/01/2020   Procedure: TRANSHIATAL TOTAL ESOPHAGECTOMY COMPLETE WITH NIMS TUBE;  Surgeon: Delight Ovens, MD;  Location: [facility] OR;  Service: Thoracic;  Laterality: N/A;   ESOPHAGOGASTRODUODENOSCOPY (EGD) WITH PROPOFOL N/A 08/30/2019   Procedure: ESOPHAGOGASTRODUODENOSCOPY (EGD) WITH PROPOFOL;  Surgeon: Jeani Hawking, MD;  Location: [facility];  Service: Endoscopy;  Laterality: N/A;   ESOPHAGOGASTRODUODENOSCOPY (EGD) WITH PROPOFOL N/A 09/13/2019   Procedure: ESOPHAGOGASTRODUODENOSCOPY (EGD) WITH PROPOFOL;  Surgeon: Jeani Hawking, MD;  [facility]: [facility];  Service: Endoscopy;  Laterality: N/A;   ESOPHAGOGASTRODUODENOSCOPY (EGD) WITH PROPOFOL N/A 03/20/2020   Procedure: ESOPHAGOGASTRODUODENOSCOPY (EGD) WITH PROPOFOL;  Surgeon: Jeani Hawking, MD;  Location: [facility];  Service: Endoscopy;  Laterality: N/A;   ESOPHAGOGASTRODUODENOSCOPY (EGD) WITH PROPOFOL N/A 04/03/2020   Procedure: ESOPHAGOGASTRODUODENOSCOPY (EGD) WITH PROPOFOL;  Surgeon: Jeani Hawking, MD;  Location: [facility];  Service: Endoscopy;  Laterality: N/A;   ESOPHAGOGASTRODUODENOSCOPY (EGD) WITH PROPOFOL N/A 04/17/2020   Procedure: ESOPHAGOGASTRODUODENOSCOPY (EGD) WITH PROPOFOL;  Surgeon: Jeani Hawking, MD;  Location: [facility];  Service: Endoscopy;  Laterality: N/A;   ESOPHAGOGASTRODUODENOSCOPY (EGD) WITH PROPOFOL N/A 05/01/2020   Procedure: ESOPHAGOGASTRODUODENOSCOPY (EGD) WITH PROPOFOL;  Surgeon: Jeani Hawking, MD;  Location: [facility];  Service: Endoscopy;  Laterality: N/A;   ESOPHAGOGASTRODUODENOSCOPY (EGD) WITH PROPOFOL N/A 05/22/2020   Procedure: ESOPHAGOGASTRODUODENOSCOPY (EGD) WITH PROPOFOL;  Surgeon: Jeani Hawking, MD;   Location: [facility];  Service: Endoscopy;  Laterality: N/A;   ESOPHAGOGASTRODUODENOSCOPY (EGD) WITH PROPOFOL N/A 06/05/2020   Procedure: ESOPHAGOGASTRODUODENOSCOPY (EGD) WITH PROPOFOL;  Surgeon: Jeani Hawking, MD;  Location: [facility];  Service: Endoscopy;  Laterality: N/A;   ESOPHAGOGASTRODUODENOSCOPY (EGD) WITH PROPOFOL N/A 07/10/2020   Procedure: ESOPHAGOGASTRODUODENOSCOPY (EGD) WITH PROPOFOL;  Surgeon: Jeani Hawking, MD;  Location: [facility];  Service: Endoscopy;  Laterality: N/A;   ESOPHAGOGASTRODUODENOSCOPY (EGD) WITH PROPOFOL N/A 09/24/2020   Procedure: ESOPHAGOGASTRODUODENOSCOPY (EGD) WITH PROPOFOL;  Surgeon: Jeani Hawking, MD;  Location: [facility];  Service: Endoscopy;  Laterality: N/A;   ESOPHAGOGASTRODUODENOSCOPY (EGD) WITH PROPOFOL N/A 05/28/2021   Procedure: ESOPHAGOGASTRODUODENOSCOPY (EGD) WITH PROPOFOL;  Surgeon: Jeani Hawking, MD;  Location: [facility];  Service: Endoscopy;  Laterality: N/A;   JEJUNOSTOMY N/A 01/01/2020   Procedure: JEJUNOSTOMY;  Surgeon: Delight Ovens, MD;  Location: [facility] OR;  Service: Thoracic;  Laterality: N/A;   shave  Left 09/27/2021   macular seborrheic keratosis, pigmented   SKIN BIOPSY N/A 03/04/2019   upper back Nerofibroma and follicular cyst    SUBMUCOSAL INJECTION  08/30/2019   Procedure: SUBMUCOSAL INJECTION;  Surgeon: Jeani Hawking, MD;  Location: [facility];  Service: Endoscopy;;   UPPER ESOPHAGEAL ENDOSCOPIC ULTRASOUND (EUS) N/A 09/13/2019   Procedure: UPPER ESOPHAGEAL ENDOSCOPIC ULTRASOUND (EUS);  Surgeon: Jeani Hawking, MD;  Location: [facility];  Service: Endoscopy;  Laterality: N/A;   VIDEO BRONCHOSCOPY N/A 01/01/2020   Procedure: VIDEO BRONCHOSCOPY WITH BRONCHIAL WASHING FOR CULTURES AND GRAM STAIN;  Surgeon: Delight Ovens, MD;  Location: [facility] OR;  Service: Thoracic;  Laterality: N/A;    I have reviewed James social history and family history with James Dunn and  they are unchanged from previous note.  ALLERGIES:   has No Known Allergies.  MEDICATIONS:  Current Outpatient Medications  Medication Sig Dispense Refill   albuterol (VENTOLIN HFA) 108 (90 Base) MCG/ACT inhaler Inhale 1 puff into James lungs every 6 (six) hours as needed for wheezing or shortness of breath. (Dunn not taking: Reported on 03/28/2022)     atorvastatin (LIPITOR) 40 MG tablet Take 1 tablet (40 mg total) by mouth daily. 90 tablet 3   budesonide-formoterol (SYMBICORT) 160-4.5 MCG/ACT inhaler Inhale 2 puffs twice a day by inhalation route for 30 days. (Dunn not taking: Reported on 03/28/2022)     esomeprazole (NEXIUM) 40 MG capsule Take 1 capsule (40 mg total) by mouth daily. 90 capsule 3   lisinopril (ZESTRIL) 5 MG tablet Take 1 tablet (5 mg total) by mouth daily. 90 tablet 3   metFORMIN (GLUCOPHAGE) 500 MG tablet Take 1 tablet (500 mg total) by mouth 2 (two) times daily with a meal. 180 tablet 1   Misc Natural Products (ESSIAC TONIC) LIQD Take 2 oz by mouth daily. Herbal Tea (Dunn not taking: Reported on 09/13/2022)     Multiple Vitamins-Minerals (MULTIVITAMIN WITH MINERALS) tablet Take 1 tablet by mouth daily.     No current facility-administered medications for this visit.    PHYSICAL EXAMINATION: ECOG PERFORMANCE STATUS: 0 - Asymptomatic  Vitals:   03/13/23 1237  BP: (!) 147/76  Pulse: (!) 54  Resp: 18  Temp: 98.3 F (36.8 C)  SpO2: 100%   Wt Readings from Last 3 Encounters:  03/13/23 155 lb 8 oz (70.5 kg)  10/06/22 154 lb (69.9 kg)  09/13/22 156 lb (70.8 kg)     GENERAL:alert, no distress and comfortable SKIN: skin color, texture, turgor are normal, no rashes or significant lesions EYES: normal, Conjunctiva are pink and non-injected, sclera clear NECK: supple, thyroid normal size, non-tender, without nodularity LYMPH:  no palpable lymphadenopathy in James cervical, axillary  LUNGS: clear to auscultation and percussion with normal breathing effort HEART: regular rate & rhythm and no murmurs and no lower  extremity edema ABDOMEN:abdomen soft, non-tender and normal bowel sounds Musculoskeletal:no cyanosis of digits and no clubbing  NEURO: alert & oriented x 3 with fluent speech, no focal motor/sensory deficits   LABORATORY DATA:  I have reviewed James data as listed    Latest Ref Rng & Units 09/05/2022    8:19 AM 04/12/2022    1:36 PM 12/07/2021    8:13 AM  CBC  WBC 4.0 - 10.5 K/uL 4.5  4.9  5.1   Hemoglobin 13.0 - 17.0 g/dL 05.3  97.6  73.4   Hematocrit 39.0 - 52.0 % 36.5  35.8  36.3   Platelets 150 - 400 K/uL 175  197  195         Latest Ref Rng & Units 09/05/2022    8:19 AM 04/12/2022    1:36 PM 01/25/2022    7:50 AM  CMP  Glucose 70 - 99 mg/dL 193  790    BUN 8 - 23 mg/dL 8  11    Creatinine 2.40 - 1.24 mg/dL 9.73  5.32  9.92   Sodium 135 - 145 mmol/L 140  139    Potassium 3.5 - 5.1 mmol/L 4.6  4.6    Chloride 98 - 111 mmol/L 105  105    CO2 22 - 32 mmol/L 29  29    Calcium 8.9 - 10.3 mg/dL 9.2  9.1    Total Protein 6.5 - 8.1 g/dL  7.2  7.0    Total Bilirubin 0.3 - 1.2 mg/dL 0.8  0.5    Alkaline Phos 38 - 126 U/L 44  47    AST 15 - 41 U/L 24  23    ALT 0 - 44 U/L 24  22        RADIOGRAPHIC STUDIES: I have personally reviewed James radiological images as listed and agreed with James findings in James report. No results found.    No orders of James defined types were placed in this encounter.  All questions were answered. James Dunn knows to call James clinic with any problems, questions or concerns. No barriers to learning was detected. James total time spent in James appointment was 15 minutes.     Malachy Mood, MD 03/13/2023    '

## 2023-03-27 DIAGNOSIS — D225 Melanocytic nevi of trunk: Secondary | ICD-10-CM | POA: Diagnosis not present

## 2023-03-27 DIAGNOSIS — L814 Other melanin hyperpigmentation: Secondary | ICD-10-CM | POA: Diagnosis not present

## 2023-03-27 DIAGNOSIS — L570 Actinic keratosis: Secondary | ICD-10-CM | POA: Diagnosis not present

## 2023-03-27 DIAGNOSIS — L821 Other seborrheic keratosis: Secondary | ICD-10-CM | POA: Diagnosis not present

## 2023-04-10 ENCOUNTER — Encounter: Payer: Self-pay | Admitting: Family Medicine

## 2023-04-13 DIAGNOSIS — H2512 Age-related nuclear cataract, left eye: Secondary | ICD-10-CM | POA: Diagnosis not present

## 2023-04-13 DIAGNOSIS — H5703 Miosis: Secondary | ICD-10-CM | POA: Diagnosis not present

## 2023-04-18 ENCOUNTER — Ambulatory Visit (INDEPENDENT_AMBULATORY_CARE_PROVIDER_SITE_OTHER): Payer: PPO | Admitting: Family Medicine

## 2023-04-18 VITALS — BP 128/70 | HR 60 | Ht 67.5 in | Wt 155.2 lb

## 2023-04-18 DIAGNOSIS — E1159 Type 2 diabetes mellitus with other circulatory complications: Secondary | ICD-10-CM

## 2023-04-18 DIAGNOSIS — D126 Benign neoplasm of colon, unspecified: Secondary | ICD-10-CM | POA: Diagnosis not present

## 2023-04-18 DIAGNOSIS — I152 Hypertension secondary to endocrine disorders: Secondary | ICD-10-CM

## 2023-04-18 DIAGNOSIS — Z789 Other specified health status: Secondary | ICD-10-CM

## 2023-04-18 DIAGNOSIS — Z8501 Personal history of malignant neoplasm of esophagus: Secondary | ICD-10-CM | POA: Insufficient documentation

## 2023-04-18 DIAGNOSIS — E119 Type 2 diabetes mellitus without complications: Secondary | ICD-10-CM | POA: Diagnosis not present

## 2023-04-18 DIAGNOSIS — I700 Atherosclerosis of aorta: Secondary | ICD-10-CM

## 2023-04-18 DIAGNOSIS — Z9889 Other specified postprocedural states: Secondary | ICD-10-CM | POA: Diagnosis not present

## 2023-04-18 DIAGNOSIS — E1169 Type 2 diabetes mellitus with other specified complication: Secondary | ICD-10-CM

## 2023-04-18 DIAGNOSIS — Z9049 Acquired absence of other specified parts of digestive tract: Secondary | ICD-10-CM

## 2023-04-18 DIAGNOSIS — E785 Hyperlipidemia, unspecified: Secondary | ICD-10-CM

## 2023-04-18 DIAGNOSIS — Z9841 Cataract extraction status, right eye: Secondary | ICD-10-CM | POA: Diagnosis not present

## 2023-04-18 LAB — POCT GLYCOSYLATED HEMOGLOBIN (HGB A1C): Hemoglobin A1C: 6.1 % — AB (ref 4.0–5.6)

## 2023-04-18 LAB — POCT UA - MICROALBUMIN
Albumin/Creatinine Ratio, Urine, POC: 3.7
Creatinine, POC: 160.9 mg/dL
Microalbumin Ur, POC: 5.9 mg/L

## 2023-04-18 NOTE — Progress Notes (Signed)
Subjective:    Patient ID: James Dunn, male    DOB: [birth year], 72 y.o.   MRN: [id number]  HPI He is here for a recheck.  He recently had right cataract surgery and will eventually get the left 1 done.  He is doing quite nicely concerning previous history of esophageal cancer which was in 2021.  He has had several esophageal dilatations to help with that.  He now has to eat more frequent but smaller meals because of this.  He also had a colonoscopy done which did show tubular adenoma and is scheduled for routine follow-up concerning that.  Continues on atorvastatin, Nexium, lisinopril and 500 of metformin.  He is having no difficulty with any of these.  He does drink over 3 beers per day.  Otherwise things are going quite well for him.  Review of record indicates aortic atherosclerosis.   Review of Systems     Objective:    Physical Exam Alert and in no distress foot exam is entirely normal.  Hemoglobin A1c is 6.1       Assessment & Plan:  Controlled type 2 diabetes mellitus without complication, without long-term current use of insulin (HCC) - Plan: POCT UA - Microalbumin, HgB A1c, CANCELED: HgB A1c  History of right cataract extraction  History of esophageal dilatation  History of esophageal cancer  Tubular adenoma of colon  Alcohol use  H/O esophagectomy  Hypertension associated with diabetes (HCC)  Hyperlipidemia associated with type 2 diabetes mellitus (HCC)  Atherosclerosis of aorta (HCC) Continue on present medication regimen but strongly encouraged him to cut his alcohol consumption down to 2 beverages per day.  Congratulated him on his hemoglobin A1c.  Discussed cataract surgery with him in more detail.  Encouraged him to get it 1 the other eye vision is diminished.

## 2023-05-10 ENCOUNTER — Other Ambulatory Visit: Payer: Self-pay | Admitting: Family Medicine

## 2023-05-10 DIAGNOSIS — E119 Type 2 diabetes mellitus without complications: Secondary | ICD-10-CM

## 2023-07-07 ENCOUNTER — Telehealth: Payer: Self-pay | Admitting: Family Medicine

## 2023-07-07 NOTE — Telephone Encounter (Signed)
Pt called stating her has blood sugar reading 300 yesterday, he does not test often and has been a while since he tested. He has to eat constantly and is hard to find time to test His test strips expired 2019 . Last times he was testing which has been a while, his readings were 140's to 160's.   His A1c's have been around 6 here in the office  Spoke to cma, it could be from  expired strips or from what he ate right before taking reading Advised pt to test with in date test strips and let us know if still having high readings

## 2023-07-30 DIAGNOSIS — H2511 Age-related nuclear cataract, right eye: Secondary | ICD-10-CM | POA: Diagnosis not present

## 2023-08-03 DIAGNOSIS — H2511 Age-related nuclear cataract, right eye: Secondary | ICD-10-CM | POA: Diagnosis not present

## 2023-08-03 DIAGNOSIS — H5703 Miosis: Secondary | ICD-10-CM | POA: Diagnosis not present

## 2023-08-15 ENCOUNTER — Encounter: Payer: Self-pay | Admitting: Internal Medicine

## 2023-08-24 ENCOUNTER — Ambulatory Visit: Admitting: Family Medicine

## 2023-08-24 ENCOUNTER — Encounter: Payer: Self-pay | Admitting: Hematology

## 2023-08-24 VITALS — BP 122/74 | HR 76 | Wt 162.2 lb

## 2023-08-24 DIAGNOSIS — J029 Acute pharyngitis, unspecified: Secondary | ICD-10-CM

## 2023-08-24 DIAGNOSIS — Z20822 Contact with and (suspected) exposure to covid-19: Secondary | ICD-10-CM

## 2023-08-24 LAB — POC COVID19 BINAXNOW: SARS Coronavirus 2 Ag: NEGATIVE

## 2023-08-24 NOTE — Progress Notes (Signed)
   Subjective:    Patient ID: James Dunn, male    DOB: 1951-06-16, 73 y.o.   MRN: [id number]  HPI He has been exposed to COVID and is now having difficulty with slight sore throat and cough but no fever, chills or earache.  Cough has been there for quite some time.   Review of Systems     Objective:    Physical Exam Alert and in no distress. Tympanic membranes and canals are normal. Pharyngeal area is normal. Neck is supple without adenopathy or thyromegaly. Cardiac exam shows a regular sinus rhythm without murmurs or gallops. Lungs are clear to auscultation. COVID test negative.       Assessment & Plan:  Exposure to COVID-19 virus Continue with supportive care.

## 2023-09-10 NOTE — Assessment & Plan Note (Signed)
Adenocarcinoma of distal esophagus, T3N2M0, stage IVA, ypT0N0  -His 08/30/19 EGD showed a large, fungating, partially circumferential mass in the lower third of the esophagus causing dysphagia. His PET from 09/20/19 showed no evidence of metastatic disease.  -His EUS from 09/13/19 which showed locally advanced adenocarcinoma invading the muscular layer with 2 enlarged LNs, T3N2M0. -He completed standard neoadjuvant concurrent ChemoRT with weekly Carboplatin and Taxol for 6 weeks 09/30/19 - 11/06/19.  -He proceeded with surgical resection by Dr Tyrone Sage on 01/01/20. Surgical path showed complete response to neoadjuvant chemoRT with no residual disease in surgery.  -He is currently on surveillance. last CT in 08/2022 was negative.

## 2023-09-11 ENCOUNTER — Inpatient Hospital Stay: Payer: PPO | Attending: Nurse Practitioner

## 2023-09-11 ENCOUNTER — Inpatient Hospital Stay: Payer: PPO | Admitting: Hematology

## 2023-09-11 VITALS — BP 132/74 | HR 58 | Temp 98.0°F | Resp 15 | Wt 160.9 lb

## 2023-09-11 DIAGNOSIS — T451X5A Adverse effect of antineoplastic and immunosuppressive drugs, initial encounter: Secondary | ICD-10-CM | POA: Insufficient documentation

## 2023-09-11 DIAGNOSIS — R059 Cough, unspecified: Secondary | ICD-10-CM | POA: Insufficient documentation

## 2023-09-11 DIAGNOSIS — C155 Malignant neoplasm of lower third of esophagus: Secondary | ICD-10-CM

## 2023-09-11 DIAGNOSIS — D6481 Anemia due to antineoplastic chemotherapy: Secondary | ICD-10-CM | POA: Diagnosis not present

## 2023-09-11 DIAGNOSIS — Z8501 Personal history of malignant neoplasm of esophagus: Secondary | ICD-10-CM | POA: Insufficient documentation

## 2023-09-11 DIAGNOSIS — K219 Gastro-esophageal reflux disease without esophagitis: Secondary | ICD-10-CM | POA: Diagnosis not present

## 2023-09-11 DIAGNOSIS — R970 Elevated carcinoembryonic antigen [CEA]: Secondary | ICD-10-CM | POA: Diagnosis not present

## 2023-09-11 LAB — CBC WITH DIFFERENTIAL (CANCER CENTER ONLY)
Abs Immature Granulocytes: 0.01 10*3/uL (ref 0.00–0.07)
Basophils Absolute: 0 10*3/uL (ref 0.0–0.1)
Basophils Relative: 1 %
Eosinophils Absolute: 0.2 10*3/uL (ref 0.0–0.5)
Eosinophils Relative: 4 %
HCT: 35.8 % — ABNORMAL LOW (ref 39.0–52.0)
Hemoglobin: 12.2 g/dL — ABNORMAL LOW (ref 13.0–17.0)
Immature Granulocytes: 0 %
Lymphocytes Relative: 23 %
Lymphs Abs: 1.1 10*3/uL (ref 0.7–4.0)
MCH: 32.3 pg (ref 26.0–34.0)
MCHC: 34.1 g/dL (ref 30.0–36.0)
MCV: 94.7 fL (ref 80.0–100.0)
Monocytes Absolute: 0.3 10*3/uL (ref 0.1–1.0)
Monocytes Relative: 7 %
Neutro Abs: 3.1 10*3/uL (ref 1.7–7.7)
Neutrophils Relative %: 65 %
Platelet Count: 204 10*3/uL (ref 150–400)
RBC: 3.78 MIL/uL — ABNORMAL LOW (ref 4.22–5.81)
RDW: 13.1 % (ref 11.5–15.5)
WBC Count: 4.7 10*3/uL (ref 4.0–10.5)
nRBC: 0 % (ref 0.0–0.2)

## 2023-09-11 LAB — CMP (CANCER CENTER ONLY)
ALT: 21 U/L (ref 0–44)
AST: 27 U/L (ref 15–41)
Albumin: 4.6 g/dL (ref 3.5–5.0)
Alkaline Phosphatase: 48 U/L (ref 38–126)
Anion gap: 6 (ref 5–15)
BUN: 9 mg/dL (ref 8–23)
CO2: 28 mmol/L (ref 22–32)
Calcium: 9.5 mg/dL (ref 8.9–10.3)
Chloride: 102 mmol/L (ref 98–111)
Creatinine: 1.15 mg/dL (ref 0.61–1.24)
GFR, Estimated: >60 mL/min (ref >60)
Glucose, Bld: 254 mg/dL — ABNORMAL HIGH (ref 70–99)
Potassium: 4.8 mmol/L (ref 3.5–5.1)
Sodium: 136 mmol/L (ref 135–145)
Total Bilirubin: 0.7 mg/dL (ref 0.0–1.2)
Total Protein: 7.5 g/dL (ref 6.5–8.1)

## 2023-09-11 LAB — CEA (ACCESS): CEA (CHCC): 19.05 ng/mL — ABNORMAL HIGH (ref 0.00–5.00)

## 2023-09-11 NOTE — Progress Notes (Signed)
 [facility]   Telephone:(336) [phone] Fax:(336) [phone]   Clinic Follow up Note   Patient Care Team: Ronnald Nian, MD as PCP - General (Family Medicine) Jeani Hawking, MD as Consulting Physician (Gastroenterology) Malachy Mood, MD as Consulting Physician (Hematology) Rollene Rotunda, MD as Consulting Physician (Cardiology)  Date of Service:  09/11/2023  CHIEF COMPLAINT: f/u of esophageal cancer  CURRENT THERAPY:  Cancer surveillance  Oncology History   Esophageal cancer [facility]) Adenocarcinoma of distal esophagus, T3N2M0, stage IVA, ypT0N0  -His 08/30/19 EGD showed a large, fungating, partially circumferential mass in the lower third of the esophagus causing dysphagia. His PET from 09/20/19 showed no evidence of metastatic disease.  -His EUS from 09/13/19 which showed locally advanced adenocarcinoma invading the muscular layer with 2 enlarged LNs, T3N2M0. -He completed standard neoadjuvant concurrent ChemoRT with weekly Carboplatin and Taxol for 6 weeks 09/30/19 - 11/06/19.  -He proceeded with surgical resection by Dr Tyrone Sage on 01/01/20. Surgical path showed complete response to neoadjuvant chemoRT with no residual disease in surgery.  -He is currently on surveillance. last CT in 08/2022 was negative.     Assessment and Plan    Esophageal cancer 73 year James Dunn male, four years post-esophageal cancer diagnosis, treated with surgery and chemoradiation. Currently asymptomatic with no dysphagia or odynophagia, but requires thorough chewing and water to aid swallowing. Weight increased to 160 lbs, highest since surgery. Previous CEA elevated but nonspecific. No need for repeat endoscopy or imaging. Cure rate generally low, but he is doing well post-treatment. Follow-up typically reduced to annual visits after four years if asymptomatic. - Schedule follow-up appointment in one year - Monitor for new symptoms or concerns and contact clinic if needed  Mild anemia Mild anemia  secondary to chemoradiation for esophageal cancer. Hemoglobin at 12.2 g/dL. No issues with blood counts.  Acid reflux Post-surgical changes causing acid reflux, managed with medication. Symptoms diet-dependent, exacerbated by foods like pizza. Sleeps with head elevated. - Continue current acid reflux medication - Advise dietary modifications to avoid trigger foods  Cough Intermittent cough, possibly related to acid reflux or post-surgical changes. Sleeps with head elevated.  Plan -Patient is clinically doing well, no concern for cancer recurrence, will continue cancer surveillance - Return to oncology clinic in one year for last follow-up         SUMMARY OF ONCOLOGIC HISTORY: Oncology History Overview Note  Cancer Staging Esophageal cancer [facility]) Staging form: Esophagus - Adenocarcinoma, AJCC 8th Edition - Clinical stage from 09/13/2019: Stage IVA (cT3, cN2, cM0) - Signed by Malachy Mood, MD on 09/22/2019 - Pathologic stage from 01/07/2020: Stage I (ypT0, pN0, cM0, G2) - Signed by Delight Ovens, MD on 01/07/2020    Esophageal cancer (HCC)  08/30/2019 Procedure   EGD by Dr. Elnoria Howard 08/30/19  IMPRESSION - Partially obstructing, malignant esophageal tumor was found in the lower third of the esophagus. Biopsied. Injected. - Normal stomach. - Normal examined duodenum.   08/30/2019 Initial Biopsy   FINAL MICROSCOPIC DIAGNOSIS:   A. ESOPHAGUS, BIOPSY:  - At least intramucosal adenocarcinoma.  - See comment.   COMMENT:   - The depth of invasion can not be determined due to the superficial  nature of the biopsy.  Dr.  Kenard Gower has reviewed the case and concurs  with this interpretation.  Additional studies can be performed upon  clinician request.    09/05/2019 Imaging   CT CAP W Contrast 09/05/19  IMPRESSION: 1. Soft tissue mass noted distal esophagus, compatible with known neoplasm. There is a  small 6 mm short axis adjacent paraesophageal node, concerning for metastatic  disease. 2. Upper normal to mildly enlarged hepatoduodenal ligament lymph node, with upper normal right para-aortic nodes. As metastatic disease cannot be excluded, PET-CT may prove helpful to further evaluate. 3. 1.9 x 1.3 cm nodular collection of soft tissue posterior to the descending duodenum. This is in close proximity to the pancreatic head but a definite communication of parenchyma between these 2 structures is not discernible by CT. 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 Attention on follow-up recommended. 4. 6 mm perifissural nodule right middle lobe, likely a subpleural lymph node. Attention on follow-up recommended. 5. Hepatomegaly with hepatic steatosis. 6. Ascending thoracic aorta measures 4 cm diameter. Recommend annual imaging followup by CTA or MRA. This recommendation follows 2010 ACCF/AHA/AATS/ACR/ASA/SCA/SCAI/SIR/STS/SVM Guidelines for the Diagnosis and Management of Patients with Thoracic Aortic Disease. Circulation. 2010; 121: G956-O130. Aortic aneurysm NOS (ICD10-I71.9) 7. Left-sided IVC, normal variant.   09/11/2019 Tumor Marker   Baseline CEA 25.12 on 09/11/19   09/13/2019 Cancer Staging   Staging form: Esophagus - Adenocarcinoma, AJCC 8th Edition - Clinical stage from 09/13/2019: Stage IVA (cT3, cN2, cM0) - Signed by Malachy Mood, MD on 09/22/2019   09/13/2019 Procedure   EUS by Dr. Elnoria Howard 09/13/19  IMPRESSION - A mass was found in the lower third of the esophagus. A tissue diagnosis was obtained prior to this exam. This is of adenocarcinoma. This was staged T3 N3 Mx by endosonographic criteria. - No specimens collected.   09/20/2019 PET scan   PET IMPRESSION: 1. Hypermetabolic distal esophageal primary. 2. Low-level hypermetabolism within upper abdominal nodes. These are technically nonspecific, but favored to be reactive in the setting of hepatic steatosis and hepatomegaly. 3. Otherwise, no evidence of metastatic disease. 4. Anal  hypermetabolism could be physiologic. Consider correlation with physical exam and colonoscopy, if patient is not up-to-date. 5. Incidental findings, including: Aortic atherosclerosis (ICD10-I70.0), coronary artery atherosclerosis and emphysema (ICD10-J43.9). Sinus disease and bilateral mastoid effusions.   09/22/2019 Initial Diagnosis   Esophageal cancer (HCC)   09/30/2019 - 11/04/2019 Chemotherapy   concurrent ChemoRT with weekly Carboplatin and Taxol starting 09/30/19. Last dose was Taxol alone given neutropenia.    09/30/2019 - 11/06/2019 Radiation Therapy   concurrent ChemoRT with Dr. Mitzi Hansen starting 09/30/19-11/06/19   12/05/2019 Imaging   CT CAP w Contrast  IMPRESSION: 1. Interval response to therapy. Approximately 50% reduction in size of distal esophageal mass 2. No new or progressive disease identified within the chest or abdomen. 3. Emphysema and aortic atherosclerosis. 4. Left main and 3 vessel coronary artery calcifications.   Aortic Atherosclerosis (ICD10-I70.0) and Emphysema (ICD10-J43.9).     01/01/2020 Surgery   VIDEO BRONCHOSCOPY WITH BRONCHIAL WASHING FOR CULTURES AND GRAM STAIN and TRANSHIATAL TOTAL ESOPHAGECTOMY COMPLETE WITH NIMS TUBE and JEJUNOSTOMY and CHEST TUBE INSERTION with Dr Tyrone Sage.    01/01/2020 Pathology Results   FINAL MICROSCOPIC DIAGNOSIS:   A. ESOPHAGUS AND PARTIAL STOMACH, ESOPHAGECTOMY:  - No residual carcinoma identified.  - Reactive changes, mixed inflammation, and ulcer.  - Sixteen of sixteen lymph nodes negative for carcinoma (0/16).  - See oncology table.    01/07/2020 Cancer Staging   Staging form: Esophagus - Adenocarcinoma, AJCC 8th Edition - Pathologic stage from 01/07/2020: Stage I (ypT0, pN0, cM0, G2) - Signed by Delight Ovens, MD on 01/07/2020   08/05/2020 Imaging   CT CAP  IMPRESSION: 1. No evidence of thoracic metastasis. 2. Gastric pull-up anatomy without evidence of local recurrence. 3. No  evidence of metastatic disease in  the abdomen pelvis. 4. Moderate volume stool throughout the colon. 5. Aortic Atherosclerosis (ICD10-I70.0).     05/28/2021 Procedure   Upper GI Endoscopy, Dr. Elnoria Howard  Impression: - Benign-appearing esophageal stenosis. Dilated. - A previous surgical anastomosis was found, characterized by healthy appearing mucosa. - Normal examined duodenum. - No specimens collected.   08/05/2021 Imaging   EXAM: CT CHEST, ABDOMEN, AND PELVIS WITH CONTRAST  IMPRESSION: 1. Stable postsurgical changes of prior gastric pull-through without new or progressive findings to suggest recurrent or metastatic disease within the chest, abdomen, or pelvis. 2. Hepatic steatosis. 3. Colonic diverticulosis without findings of acute diverticulitis. 4. Aortic Atherosclerosis (ICD10-I70.0) and Emphysema (ICD10-J43.9).   09/05/2022 Imaging   CT CHEST ABDOMEN PELVIS W CONTRAST   IMPRESSION: 1. Stable changes post esophagectomy and gastric pull-through without evidence of local recurrence or metastatic disease within the chest, abdomen, or pelvis. 2. Hepatic steatosis. 3. Left-sided colonic diverticulosis without findings of acute diverticulitis. 4. Aortic Atherosclerosis (ICD10-I70.0) and Emphysema (ICD10-J43.9).        Discussed the use of AI scribe software for clinical note transcription with the patient, who gave verbal consent to proceed.  History of Present Illness   The patient, a James James Dunn with a history of esophageal cancer, presents for a routine follow-up. He reports a significant weight gain, reaching 160 pounds, the highest since his surgery. He denies any difficulty swallowing but notes the need to chew food thoroughly before swallowing. He also mentions a persistent cough and the need to sleep with the head elevated due to acid reflux. The patient is a former smoker and has been cancer-free for four years.         All other systems were reviewed with the patient and are negative.  MEDICAL  HISTORY:  Past Medical History:  Diagnosis Date   Allergy    Asthma    Controlled type 2 diabetes mellitus without complication, without long-term current use of insulin (HCC) 06/25/2012   Diverticulosis    ED (erectile dysfunction) 12/28/2010   Esophageal cancer (HCC) 09/22/2019   Former smoker 06/25/2012   GERD (gastroesophageal reflux disease) 2002   History of colonic polyps 05/11/2016   Hyperlipidemia associated with type 2 diabetes mellitus (HCC) 05/11/2016   Hypertension associated with diabetes (HCC) 08/02/2017   denies   Mild intermittent asthma without complication 11/29/2016   Seasonal allergic rhinitis due to pollen 11/29/2016   Thoracic aortic aneurysm (HCC)    Ascending thoracic aorta measures 4 cm diameter   Tubular adenoma of colon 12/05/2016    SURGICAL HISTORY: Past Surgical History:  Procedure Laterality Date   BALLOON DILATION N/A 03/20/2020   Procedure: BALLOON DILATION;  Surgeon: Jeani Hawking, MD;  Location: [facility];  Service: Endoscopy;  Laterality: N/A;   BALLOON DILATION N/A 04/03/2020   Procedure: BALLOON DILATION;  Surgeon: Jeani Hawking, MD;  Location: [facility];  Service: Endoscopy;  Laterality: N/A;   BALLOON DILATION N/A 04/17/2020   Procedure: BALLOON DILATION;  Surgeon: Jeani Hawking, MD;  Location: [facility];  Service: Endoscopy;  Laterality: N/A;   BALLOON DILATION N/A 05/01/2020   Procedure: BALLOON DILATION;  Surgeon: Jeani Hawking, MD;  Location: [facility];  Service: Endoscopy;  Laterality: N/A;   BALLOON DILATION N/A 05/22/2020   Procedure: BALLOON DILATION;  Surgeon: Jeani Hawking, MD;  Location: [facility];  Service: Endoscopy;  Laterality: N/A;   BALLOON DILATION N/A 06/05/2020   Procedure: BALLOON DILATION;  Surgeon: Jeani Hawking, MD;  Location: [facility];  Service: Endoscopy;  Laterality: N/A;   BALLOON DILATION N/A 07/10/2020   Procedure: BALLOON DILATION;  Surgeon: Jeani Hawking, MD;  Location: [facility];  Service:  Endoscopy;  Laterality: N/A;   BALLOON DILATION N/A 09/24/2020   Procedure: BALLOON DILATION;  Surgeon: Jeani Hawking, MD;  Location: [facility];  Service: Endoscopy;  Laterality: N/A;   BALLOON DILATION N/A 05/28/2021   Procedure: BALLOON DILATION;  Surgeon: Jeani Hawking, MD;  Location: [facility];  Service: Endoscopy;  Laterality: N/A;   BIOPSY  08/30/2019   Procedure: BIOPSY;  Surgeon: Jeani Hawking, MD;  Location: [facility];  Service: Endoscopy;;   CHEST TUBE INSERTION Left 01/01/2020   Procedure: CHEST TUBE INSERTION;  Surgeon: Delight Ovens, MD;  Location[facility] OR;  Service: Thoracic;  Laterality: Left;   COMPLETE ESOPHAGECTOMY N/A 01/01/2020   Procedure: TRANSHIATAL TOTAL ESOPHAGECTOMY COMPLETE WITH NIMS TUBE;  Surgeon: Delight Ovens, MD;  Location: [facility] OR;  Service: Thoracic;  Laterality: N/A;   ESOPHAGOGASTRODUODENOSCOPY (EGD) WITH PROPOFOL N/A 08/30/2019   Procedure: ESOPHAGOGASTRODUODENOSCOPY (EGD) WITH PROPOFOL;  Surgeon: Jeani Hawking, MD;  Location: [facility];  Service: Endoscopy;  Laterality: N/A;   ESOPHAGOGASTRODUODENOSCOPY (EGD) WITH PROPOFOL N/A 09/13/2019   Procedure: ESOPHAGOGASTRODUODENOSCOPY (EGD) WITH PROPOFOL;  Surgeon: Jeani Hawking, MD;  Location: [facility];  Service: Endoscopy;  Laterality: N/A;   ESOPHAGOGASTRODUODENOSCOPY (EGD) WITH PROPOFOL N/A 03/20/2020   Procedure: ESOPHAGOGASTRODUODENOSCOPY (EGD) WITH PROPOFOL;  Surgeon: Jeani Hawking, MD;  Location: [facility];  Service: Endoscopy;  Laterality: N/A;   ESOPHAGOGASTRODUODENOSCOPY (EGD) WITH PROPOFOL N/A 04/03/2020   Procedure: ESOPHAGOGASTRODUODENOSCOPY (EGD) WITH PROPOFOL;  Surgeon: Jeani Hawking, MD;  Location: [facility];  Service: Endoscopy;  Laterality: N/A;   ESOPHAGOGASTRODUODENOSCOPY (EGD) WITH PROPOFOL N/A 04/17/2020   Procedure: ESOPHAGOGASTRODUODENOSCOPY (EGD) WITH PROPOFOL;  Surgeon: Jeani Hawking, MD;  Location: [facility];  Service: Endoscopy;  Laterality: N/A;    ESOPHAGOGASTRODUODENOSCOPY (EGD) WITH PROPOFOL N/A 05/01/2020   Procedure: ESOPHAGOGASTRODUODENOSCOPY (EGD) WITH PROPOFOL;  Surgeon: Jeani Hawking, MD;  Location: [facility];  Service: Endoscopy;  Laterality: N/A;   ESOPHAGOGASTRODUODENOSCOPY (EGD) WITH PROPOFOL N/A 05/22/2020   Procedure: ESOPHAGOGASTRODUODENOSCOPY (EGD) WITH PROPOFOL;  Surgeon: Jeani Hawking, MD;  Location: [facility];  Service: Endoscopy;  Laterality: N/A;   ESOPHAGOGASTRODUODENOSCOPY (EGD) WITH PROPOFOL N/A 06/05/2020   Procedure: ESOPHAGOGASTRODUODENOSCOPY (EGD) WITH PROPOFOL;  Surgeon: Jeani Hawking, MD;  Location: [facility];  Service: Endoscopy;  Laterality: N/A;   ESOPHAGOGASTRODUODENOSCOPY (EGD) WITH PROPOFOL N/A 07/10/2020   Procedure: ESOPHAGOGASTRODUODENOSCOPY (EGD) WITH PROPOFOL;  Surgeon: Jeani Hawking, MD;  Location: [facility];  Service: Endoscopy;  Laterality: N/A;   ESOPHAGOGASTRODUODENOSCOPY (EGD) WITH PROPOFOL N/A 09/24/2020   Procedure: ESOPHAGOGASTRODUODENOSCOPY (EGD) WITH PROPOFOL;  Surgeon: Jeani Hawking, MD;  Location: [facility];  Service: Endoscopy;  Laterality: N/A;   ESOPHAGOGASTRODUODENOSCOPY (EGD) WITH PROPOFOL N/A 05/28/2021   Procedure: ESOPHAGOGASTRODUODENOSCOPY (EGD) WITH PROPOFOL;  Surgeon: Jeani Hawking, MD;  Location: [facility];  Service: Endoscopy;  Laterality: N/A;   JEJUNOSTOMY N/A 01/01/2020   Procedure: JEJUNOSTOMY;  Surgeon: Delight Ovens, MD;  Location: [facility] OR;  Service: Thoracic;  Laterality: N/A;   shave  Left 09/27/2021   macular seborrheic keratosis, pigmented   SKIN BIOPSY N/A 03/04/2019   upper back Nerofibroma and follicular cyst    SUBMUCOSAL INJECTION  08/30/2019   Procedure: SUBMUCOSAL INJECTION;  Surgeon: Jeani Hawking, MD;  Location: [facility];  Service: Endoscopy;;   UPPER ESOPHAGEAL ENDOSCOPIC ULTRASOUND (EUS) N/A 09/13/2019   Procedure: UPPER ESOPHAGEAL ENDOSCOPIC ULTRASOUND (EUS);  Surgeon: Jeani Hawking, MD;  Location: [facility];  Service:  Endoscopy;  Laterality: N/A;   VIDEO BRONCHOSCOPY N/A 01/01/2020   Procedure: VIDEO BRONCHOSCOPY WITH BRONCHIAL WASHING FOR CULTURES AND GRAM STAIN;  Surgeon: Delight Ovens, MD;  Location: [facility] OR;  Service: Thoracic;  Laterality: N/A;    I have reviewed the social history and family history with the patient and they are unchanged from previous note.  ALLERGIES:  has no known allergies.  MEDICATIONS:  Current Outpatient Medications  Medication Sig Dispense Refill   albuterol (VENTOLIN HFA) 108 (90 Base) MCG/ACT inhaler Inhale 1 puff into the lungs every 6 (six) hours as needed for wheezing or shortness of breath.     atorvastatin (LIPITOR) 40 MG tablet Take 1 tablet (40 mg total) by mouth daily. 90 tablet 3   budesonide-formoterol (SYMBICORT) 160-4.5 MCG/ACT inhaler Inhale 2 puffs twice a day by inhalation route for 30 days. (Patient not taking: Reported on 08/24/2023)     esomeprazole (NEXIUM) 40 MG capsule Take 1 capsule (40 mg total) by mouth daily. 90 capsule 3   lisinopril (ZESTRIL) 5 MG tablet Take 1 tablet (5 mg total) by mouth daily. 90 tablet 3   metFORMIN (GLUCOPHAGE) 500 MG tablet TAKE 1 TABLET BY MOUTH 2 TIMES DAILY WITH A MEAL. 180 tablet 1   Misc Natural Products (ESSIAC TONIC) LIQD Take 2 oz by mouth daily. Herbal Tea     Multiple Vitamins-Minerals (MULTIVITAMIN WITH MINERALS) tablet Take 1 tablet by mouth daily.     No current facility-administered medications for this visit.    PHYSICAL EXAMINATION: ECOG PERFORMANCE STATUS: 0 - Asymptomatic  Vitals:   09/11/23 1054  BP: 132/74  Pulse: (!) 58  Resp: 15  Temp: 98 F (36.7 C)  SpO2: 100%   Wt Readings from Last 3 Encounters:  09/11/23 160 lb 14.4 oz (73 kg)  08/24/23 162 lb 3.2 oz (73.6 kg)  04/18/23 155 lb 3.2 oz (70.4 kg)     GENERAL:alert, no distress and comfortable SKIN: skin color, texture, turgor are normal, no rashes or significant lesions EYES: normal, Conjunctiva are pink and non-injected, sclera  clear NECK: supple, thyroid normal size, non-tender, without nodularity LYMPH:  no palpable lymphadenopathy in the cervical, axillary  LUNGS: clear to auscultation and percussion with normal breathing effort HEART: regular rate & rhythm and no murmurs and no lower extremity edema ABDOMEN:abdomen soft, non-tender and normal bowel sounds Musculoskeletal:no cyanosis of digits and no clubbing  NEURO: alert & oriented x 3 with fluent speech, no focal motor/sensory deficits   LABORATORY DATA:  I have reviewed the data as listed    Latest Ref Rng & Units 09/11/2023   10:32 AM 03/13/2023   12:55 PM 09/05/2022    8:19 AM  CBC  WBC 4.0 - 10.5 K/uL 4.7  5.3  4.5   Hemoglobin 13.0 - 17.0 g/dL 57.8  46.9  62.9   Hematocrit 39.0 - 52.0 % 35.8  34.2  36.5   Platelets 150 - 400 K/uL 204  212  175         Latest Ref Rng & Units 09/11/2023   10:32 AM 03/13/2023   12:55 PM 09/05/2022    8:19 AM  CMP  Glucose 70 - James mg/dL 528  413  244   BUN 8 - 23 mg/dL 9  9  8    Creatinine 0.61 - 1.24 mg/dL 0.10  2.72  5.36   Sodium 135 - 145 mmol/L 136  137  140   Potassium 3.5 - 5.1 mmol/L 4.8  4.6  4.6  Chloride 98 - 111 mmol/L 102  103  105   CO2 22 - 32 mmol/L 28  28  29    Calcium 8.9 - 10.3 mg/dL 9.5  9.1  9.2   Total Protein 6.5 - 8.1 g/dL 7.5  7.2  7.2   Total Bilirubin 0.0 - 1.2 mg/dL 0.7  0.6  0.8   Alkaline Phos 38 - 126 U/L 48  55  44   AST 15 - 41 U/L 27  25  24    ALT 0 - 44 U/L 21  23  24        RADIOGRAPHIC STUDIES: I have personally reviewed the radiological images as listed and agreed with the findings in the report. No results found.    No orders of the defined types were placed in this encounter.  All questions were answered. The patient knows to call the clinic with any problems, questions or concerns. No barriers to learning was detected. The total time spent in the appointment was 20 minutes.     Malachy Mood, MD 09/11/2023

## 2023-09-16 ENCOUNTER — Other Ambulatory Visit: Payer: Self-pay | Admitting: Family Medicine

## 2023-09-16 DIAGNOSIS — E119 Type 2 diabetes mellitus without complications: Secondary | ICD-10-CM

## 2023-09-19 ENCOUNTER — Ambulatory Visit: Payer: PPO

## 2023-09-19 ENCOUNTER — Other Ambulatory Visit: Payer: Self-pay | Admitting: Family Medicine

## 2023-09-19 DIAGNOSIS — Z0000 Encounter for general adult medical examination without abnormal findings: Secondary | ICD-10-CM

## 2023-09-19 DIAGNOSIS — E119 Type 2 diabetes mellitus without complications: Secondary | ICD-10-CM

## 2023-09-19 NOTE — Progress Notes (Signed)
 Subjective:   James Dunn is a 73 y.o. who presents for a Medicare Wellness preventive visit.  Visit Complete: Virtual I connected with  James Dunn on 09/19/23 by a audio enabled telemedicine application and verified that I am speaking with the correct person using two identifiers.  Patient Location: Home  Provider Location: Office/Clinic  I discussed the limitations of evaluation and management by telemedicine. The patient expressed understanding and agreed to proceed.  Vital Signs: Because this visit was a virtual/telehealth visit, some criteria may be missing or patient reported. Any vitals not documented were not able to be obtained and vitals that have been documented are patient reported.  VideoError- Librarian, academic were attempted between this provider and patient, however failed, due to patient having technical difficulties OR patient did not have access to video capability.  We continued and completed visit with audio only.   Persons Participating in Visit: Patient.  AWV Questionnaire: Yes: Patient Medicare AWV questionnaire was completed by the patient on 09/19/2023; I have confirmed that all information answered by patient is correct and no changes since this date.  Cardiac Risk Factors include: advanced age (>56men, >37 women);diabetes mellitus;dyslipidemia;hypertension;male gender     Objective:    Today's Vitals   There is no height or weight on file to calculate BMI.     09/19/2023    8:51 AM 09/13/2022    8:46 AM 09/06/2021   10:24 AM 05/28/2021    6:52 AM 09/24/2020    1:26 PM 07/20/2020    8:23 AM 07/10/2020    9:16 AM  Advanced Directives  Does Patient Have a Medical Advance Directive? Yes Yes Yes Yes No Yes No  Type of Estate agent of [place];Living will Healthcare Power of [place];Living will Living will Out of facility DNR (pink MOST or yellow form);Healthcare Power of [place];Living will  Living will    Does patient want to make changes to medical advance directive?   No - Patient declined      Copy of Healthcare Power of Attorney in Chart? Yes - validated most recent copy scanned in chart (See row information) Yes - validated most recent copy scanned in chart (See row information)       Would patient like information on creating a medical advance directive?     No - Patient declined  No - Patient declined    Current Medications (verified) Outpatient Encounter Medications as of 09/19/2023  Medication Sig   albuterol (VENTOLIN HFA) 108 (90 Base) MCG/ACT inhaler Inhale 1 puff into the lungs every 6 (six) hours as needed for wheezing or shortness of breath.   atorvastatin (LIPITOR) 40 MG tablet Take 1 tablet (40 mg total) by mouth daily.   beta carotene w/minerals (OCUVITE) tablet Take 1 tablet by mouth daily.   esomeprazole (NEXIUM) 40 MG capsule Take 1 capsule (40 mg total) by mouth daily.   lisinopril (ZESTRIL) 5 MG tablet TAKE 1 TABLET (5 MG TOTAL) BY MOUTH DAILY.   metFORMIN (GLUCOPHAGE) 500 MG tablet TAKE 1 TABLET BY MOUTH 2 TIMES DAILY WITH A MEAL.   Misc Natural Products (ESSIAC TONIC) LIQD Take 2 oz by mouth daily. Herbal Tea   Multiple Vitamins-Minerals (MULTIVITAMIN WITH MINERALS) tablet Take 1 tablet by mouth daily.   budesonide-formoterol (SYMBICORT) 160-4.5 MCG/ACT inhaler Inhale 2 puffs twice a day by inhalation route for 30 days. (Patient not taking: Reported on 09/19/2023)   No facility-administered encounter medications on file as of 09/19/2023.  Allergies (verified) Patient has no known allergies.   History: Past Medical History:  Diagnosis Date   Allergy    Asthma    Controlled type 2 diabetes mellitus without complication, without long-term current use of insulin (HCC) 06/25/2012   Diverticulosis    ED (erectile dysfunction) 12/28/2010   Esophageal cancer (HCC) 09/22/2019   Former smoker 06/25/2012   GERD (gastroesophageal reflux disease) 2002   History of colonic polyps  05/11/2016   Hyperlipidemia associated with type 2 diabetes mellitus (HCC) 05/11/2016   Hypertension associated with diabetes (HCC) 08/02/2017   denies   Mild intermittent asthma without complication 11/29/2016   Seasonal allergic rhinitis due to pollen 11/29/2016   Thoracic aortic aneurysm (HCC)    Ascending thoracic aorta measures 4 cm diameter   Tubular adenoma of colon 12/05/2016   Past Surgical History:  Procedure Laterality Date   BALLOON DILATION N/A 03/20/2020   Procedure: BALLOON DILATION;  Surgeon: Jeani Hawking, MD;  Location: [facility];  Service: Endoscopy;  Laterality: N/A;   BALLOON DILATION N/A 04/03/2020   Procedure: BALLOON DILATION;  Surgeon: Jeani Hawking, MD;  Location: [facility];  Service: Endoscopy;  Laterality: N/A;   BALLOON DILATION N/A 04/17/2020   Procedure: BALLOON DILATION;  Surgeon: Jeani Hawking, MD;  Location: [facility];  Service: Endoscopy;  Laterality: N/A;   BALLOON DILATION N/A 05/01/2020   Procedure: BALLOON DILATION;  Surgeon: Jeani Hawking, MD;  [facility]: [facility];  Service: Endoscopy;  Laterality: N/A;   BALLOON DILATION N/A 05/22/2020   Procedure: BALLOON DILATION;  Surgeon: Jeani Hawking, MD;  [facility];  Service: Endoscopy;  Laterality: N/A;   BALLOON DILATION N/A 06/05/2020   Procedure: BALLOON DILATION;  Surgeon: Jeani Hawking, MD;  [facility];  Service: Endoscopy;  Laterality: N/A;   BALLOON DILATION N/A 07/10/2020   Procedure: BALLOON DILATION;  Surgeon: Jeani Hawking, MD;  [facility];  Service: Endoscopy;  Laterality: N/A;   BALLOON DILATION N/A 09/24/2020   Procedure: BALLOON DILATION;  Surgeon: Jeani Hawking, MD;  [facility];  Service: Endoscopy;  Laterality: N/A;   BALLOON DILATION N/A 05/28/2021   Procedure: BALLOON DILATION;  Surgeon: Jeani Hawking, MD;  [facility];  Service: Endoscopy;  Laterality: N/A;   BIOPSY  08/30/2019   Procedure: BIOPSY;  Surgeon: Jeani Hawking, MD;  [facility];  Service: Endoscopy;;   CATARACT EXTRACTION Bilateral    05/2023, 07/2023   CHEST TUBE INSERTION Left 01/01/2020   Procedure: CHEST TUBE INSERTION;  Surgeon: Delight Ovens, MD;  Location[facility] OR;  Service: Thoracic;  Laterality: Left;   COMPLETE ESOPHAGECTOMY N/A 01/01/2020   Procedure: TRANSHIATAL TOTAL ESOPHAGECTOMY COMPLETE WITH NIMS TUBE;  Surgeon: Delight Ovens, MD;  Location: [facility] OR;  Service: Thoracic;  Laterality: N/A;   ESOPHAGOGASTRODUODENOSCOPY (EGD) WITH PROPOFOL N/A 08/30/2019   Procedure: ESOPHAGOGASTRODUODENOSCOPY (EGD) WITH PROPOFOL;  Surgeon: Jeani Hawking, MD;  [facility];  Service: Endoscopy;  Laterality: N/A;   ESOPHAGOGASTRODUODENOSCOPY (EGD) WITH PROPOFOL N/A 09/13/2019   Procedure: ESOPHAGOGASTRODUODENOSCOPY (EGD) WITH PROPOFOL;  Surgeon: Jeani Hawking, MD;  [facility];  Service: Endoscopy;  Laterality: N/A;   ESOPHAGOGASTRODUODENOSCOPY (EGD) WITH PROPOFOL N/A 03/20/2020   Procedure: ESOPHAGOGASTRODUODENOSCOPY (EGD) WITH PROPOFOL;  Surgeon: Jeani Hawking, MD;  [facility];  Service: Endoscopy;  Laterality: N/A;   ESOPHAGOGASTRODUODENOSCOPY (EGD) WITH PROPOFOL N/A 04/03/2020   Procedure: ESOPHAGOGASTRODUODENOSCOPY (EGD) WITH PROPOFOL;  Surgeon: Jeani Hawking, MD;  [facility];  Service: Endoscopy;  Laterality: N/A;   ESOPHAGOGASTRODUODENOSCOPY (EGD) WITH  PROPOFOL N/A 04/17/2020   Procedure: ESOPHAGOGASTRODUODENOSCOPY (EGD) WITH PROPOFOL;  Surgeon: Jeani Hawking, MD;  [facility];  Service: Endoscopy;  Laterality: N/A;   ESOPHAGOGASTRODUODENOSCOPY (EGD) WITH PROPOFOL N/A 05/01/2020   Procedure: ESOPHAGOGASTRODUODENOSCOPY (EGD) WITH PROPOFOL;  Surgeon: Jeani Hawking, MD;  [facility];  Service: Endoscopy;  Laterality: N/A;   ESOPHAGOGASTRODUODENOSCOPY (EGD) WITH PROPOFOL N/A 05/22/2020   Procedure: ESOPHAGOGASTRODUODENOSCOPY (EGD) WITH PROPOFOL;  Surgeon: Jeani Hawking, MD;  [facility];  Service: Endoscopy;  Laterality: N/A;   ESOPHAGOGASTRODUODENOSCOPY (EGD) WITH PROPOFOL N/A 06/05/2020   Procedure: ESOPHAGOGASTRODUODENOSCOPY (EGD) WITH PROPOFOL;  Surgeon: Jeani Hawking, MD;  [facility];  Service: Endoscopy;  Laterality: N/A;   ESOPHAGOGASTRODUODENOSCOPY (EGD) WITH PROPOFOL N/A 07/10/2020   Procedure: ESOPHAGOGASTRODUODENOSCOPY (EGD) WITH PROPOFOL;  Surgeon: Jeani Hawking, MD;  [facility];  Service: Endoscopy;  Laterality: N/A;   ESOPHAGOGASTRODUODENOSCOPY (EGD) WITH PROPOFOL N/A 09/24/2020   Procedure: ESOPHAGOGASTRODUODENOSCOPY (EGD) WITH PROPOFOL;  Surgeon: Jeani Hawking, MD;  [facility];  Service: Endoscopy;  Laterality: N/A;   ESOPHAGOGASTRODUODENOSCOPY (EGD) WITH PROPOFOL N/A 05/28/2021   Procedure: ESOPHAGOGASTRODUODENOSCOPY (EGD) WITH PROPOFOL;  Surgeon: Jeani Hawking, MD;  [facility];  Service: Endoscopy;  Laterality: N/A;   JEJUNOSTOMY N/A 01/01/2020   Procedure: JEJUNOSTOMY;  Surgeon: Delight Ovens, MD;  Location: [facility] OR;  Service: Thoracic;  Laterality: N/A;   shave  Left 09/27/2021   macular seborrheic keratosis, pigmented   SKIN BIOPSY N/A 03/04/2019   upper back Nerofibroma and follicular cyst    SUBMUCOSAL INJECTION  08/30/2019   Procedure: SUBMUCOSAL INJECTION;  Surgeon: Jeani Hawking, MD;  [facility];  Service: Endoscopy;;   UPPER ESOPHAGEAL ENDOSCOPIC ULTRASOUND (EUS) N/A 09/13/2019   Procedure: UPPER ESOPHAGEAL ENDOSCOPIC ULTRASOUND (EUS);  Surgeon: Jeani Hawking, MD;  Location: [facility];  Service: Endoscopy;  Laterality: N/A;   VIDEO BRONCHOSCOPY N/A 01/01/2020   Procedure: VIDEO BRONCHOSCOPY WITH BRONCHIAL WASHING FOR CULTURES AND GRAM STAIN;  Surgeon: Delight Ovens, MD;  Location: [facility] OR;  Service: Thoracic;  Laterality: N/A;   Family History  Problem Relation Age of Onset   Emphysema Mother    AAA (abdominal aortic aneurysm) Father     Social History   Socioeconomic History   Marital status: Married    Spouse name: Not on file   Number of children: Not on file   Years of education: Not on file   Highest education level: Not on file  Occupational History   Not on file  Tobacco Use   Smoking status: Former    Current packs/day: 0.00    Types: Cigarettes    Quit date: 03/21/2019    Years since quitting: 4.5   Smokeless tobacco: Never  Vaping Use   Vaping status: Never Used  Substance and Sexual Activity   Alcohol use: Yes    Alcohol/week: 7.0 standard drinks of alcohol    Types: 7 Cans of beer per week    Comment: sometimes more   Drug use: No   Sexual activity: Yes  Other Topics Concern   Not on file  Social History Narrative   Lives at home with wife.     Social Drivers of Corporate investment banker Strain: Low Risk  (09/19/2023)   Overall Financial Resource Strain (CARDIA)    Difficulty of Paying Living Expenses: Not hard at all  Food Insecurity: No Food Insecurity (09/19/2023)   Hunger Vital Sign    Worried About Running Out of Food in the Last Year: Never true  Ran Out of Food in the Last Year: Never true  Transportation Needs: No Transportation Needs (09/19/2023)   PRAPARE - Administrator, Civil Service (Medical): No    Lack of Transportation (Non-Medical): No  Physical Activity: Sufficiently Active (09/19/2023)   Exercise Vital Sign    Days of Exercise per Week: 4 days    Minutes of Exercise per Session: 90 min  Stress: No Stress Concern Present (09/19/2023)   [employer] of Occupational Health - Occupational Stress Questionnaire    Feeling of Stress : Not at all  Social Connections: Moderately Isolated (09/19/2023)   Social Connection and Isolation Panel [NHANES]    Frequency of Communication with Friends and Family: More than three times a week    Frequency of Social Gatherings with Friends and Family: More than three times a week    Attends Religious Services: Never     Database administrator or Organizations: No    Attends Engineer, structural: Never    Marital Status: Married    Tobacco Counseling Counseling given: Not Answered    Clinical Intake:  Pre-visit preparation completed: Yes  Pain : No/denies pain     Nutritional Risks: None Diabetes: Yes CBG done?: No Did pt. bring in CBG monitor from home?: No  Lab Results  Component Value Date   HGBA1C 6.1 (A) 04/18/2023   HGBA1C 6.4 (A) 10/06/2022   HGBA1C 6.3 (A) 03/28/2022     How often do you need to have someone help you when you read instructions, pamphlets, or other written materials from your doctor or pharmacy?: 1 - Never  Interpreter Needed?: No  Information entered by :: NAllen LPN   Activities of Daily Living     09/19/2023    8:23 AM  In your present state of health, do you have any difficulty performing the following activities:  Hearing? 0  Vision? 0  Difficulty concentrating or making decisions? 0  Walking or climbing stairs? 0  Dressing or bathing? 0  Doing errands, shopping? 0  Preparing Food and eating ? N  Using the Toilet? N  In the past six months, have you accidently leaked urine? N  Do you have problems with loss of bowel control? N  Managing your Medications? N  Managing your Finances? N  Housekeeping or managing your Housekeeping? N    Patient Care Team: Ronnald Nian, MD as PCP - General (Family Medicine) Jeani Hawking, MD as Consulting Physician (Gastroenterology) Malachy Mood, MD as Consulting Physician (Hematology) Rollene Rotunda, MD as Consulting Physician (Cardiology)  Indicate any recent Medical Services you may have received from other than [facility] providers in the past year (date may be approximate).     Assessment:   This is a routine wellness examination for [employer].  Hearing/Vision screen Hearing Screening - Comments:: Denies hearing issues Vision Screening - Comments:: Regular eye exams, [facility]   Goals Addressed              This Visit's Progress    Patient Stated       09/19/2023, stay alive       Depression Screen     09/19/2023    8:52 AM 09/13/2022    8:47 AM 09/06/2021   10:24 AM 07/20/2020    8:24 AM 10/10/2018    8:12 AM 08/16/2016    4:23 PM 08/02/2016    3:58 PM  PHQ 2/9 Scores  PHQ - 2 Score 0 0 0 0 0 0 0  PHQ- 9 Score 0 0         Fall Risk     09/19/2023    8:23 AM 09/13/2022    8:47 AM 09/06/2021   10:24 AM 07/20/2020    8:24 AM 10/10/2018    8:12 AM  Fall Risk   Falls in the past year? 0 0 0 0 0  Number falls in past yr: 0 0 0    Injury with Fall? 0 0 0    Risk for fall due to : Medication side effect No Fall Risks No Fall Risks    Follow up Falls prevention discussed;Falls evaluation completed Falls prevention discussed;Education provided;Falls evaluation completed Falls evaluation completed      MEDICARE RISK AT HOME:  Medicare Risk at Home Any stairs in or around the home?: (Patient-Rptd) Yes If so, are there any without handrails?: (Patient-Rptd) Yes Home free of loose throw rugs in walkways, pet beds, electrical cords, etc?: (Patient-Rptd) Yes Adequate lighting in your home to reduce risk of falls?: (Patient-Rptd) Yes Life alert?: (Patient-Rptd) No Use of a cane, walker or w/c?: (Patient-Rptd) No Grab bars in the bathroom?: (Patient-Rptd) No Shower chair or bench in shower?: (Patient-Rptd) No Elevated toilet seat or a handicapped toilet?: (Patient-Rptd) No  TIMED UP AND GO:  Was the test performed?  No  Cognitive Function: 6CIT completed        09/19/2023    8:52 AM 09/13/2022    8:48 AM  6CIT Screen  What Year? 0 points 0 points  What month? 0 points 0 points  What time? 0 points 3 points  Count back from 20 0 points 0 points  Months in reverse 0 points 0 points  Repeat phrase 0 points 0 points  Total Score 0 points 3 points    Immunizations Immunization History  Administered Date(s) Administered   DTaP 11/21/1991   Fluad Quad(high Dose 65+)  02/26/2019, 03/16/2020, 02/24/2022   Fluad Trivalent(High Dose 65+) 03/08/2023   Influenza Split 06/28/2011   Influenza, High Dose Seasonal PF 05/21/2015, 03/30/2016, 04/04/2017, 05/22/2018   Influenza, Seasonal, Injecte, Preservative Fre 07/10/2012   Influenza,inj,Quad PF,6+ Mos 04/02/2013   Influenza-Unspecified 03/24/2021   Moderna Covid-19 Fall Seasonal Vaccine 51yrs & older 03/31/2022, 03/02/2023   Moderna Covid-19 Vaccine Bivalent Booster 65yrs & up 03/24/2021   Moderna Sars-Covid-2 Vaccination 08/04/2019, 09/02/2019, 02/24/2020, 10/29/2020   Pneumococcal Conjugate-13 05/11/2016   Pneumococcal Polysaccharide-23 01/17/2001   RSV,unspecified 03/31/2022   Tdap 12/03/2009, 07/08/2019   Unspecified SARS-COV-2 Vaccination 03/31/2022   Zoster Recombinant(Shingrix) 07/18/2017, 09/21/2017   Zoster, Live 11/21/2011, 07/18/2017    Screening Tests Health Maintenance  Topic Date Due   Pneumonia Vaccine 7+ Years old (3 of 3 - PPSV23 or PCV20) 05/11/2017   COVID-19 Vaccine (8 - Moderna risk 2024-25 season) 08/30/2023   HEMOGLOBIN A1C  10/17/2023   INFLUENZA VACCINE  01/19/2024   OPHTHALMOLOGY EXAM  03/07/2024   Diabetic kidney evaluation - Urine ACR  04/17/2024   FOOT EXAM  04/17/2024   Diabetic kidney evaluation - eGFR measurement  09/10/2024   Medicare Annual Wellness (AWV)  09/18/2024   DTaP/Tdap/Td (4 - Td or Tdap) 07/07/2029   Colonoscopy  10/23/2032   Hepatitis C Screening  Completed   Zoster Vaccines- Shingrix  Completed   HPV VACCINES  Aged Out    Health Maintenance  Health Maintenance Due  Topic Date Due   Pneumonia Vaccine 73+ Years old (3 of 3 - PPSV23 or PCV20) 05/11/2017   COVID-19 Vaccine (8 - Moderna risk 2024-25  season) 08/30/2023   Health Maintenance Items Addressed: Up to date  Additional Screening:  Vision Screening: Recommended annual ophthalmology exams for early detection of glaucoma and other disorders of the eye.  Dental Screening: Recommended  annual dental exams for proper oral hygiene  Community Resource Referral / Chronic Care Management: CRR required this visit?  No   CCM required this visit?  No     Plan:     I have personally reviewed and noted the following in the patient's chart:   Medical and social history Use of alcohol, tobacco or illicit drugs  Current medications and supplements including opioid prescriptions. Patient is not currently taking opioid prescriptions. Functional ability and status Nutritional status Physical activity Advanced directives List of other physicians Hospitalizations, surgeries, and ER visits in previous 12 months Vitals Screenings to include cognitive, depression, and falls Referrals and appointments  In addition, I have reviewed and discussed with patient certain preventive protocols, quality metrics, and best practice recommendations. A written personalized care plan for preventive services as well as general preventive health recommendations were provided to patient.     Barb Merino, LPN   [id number]/[id number]   After Visit Summary: (MyChart) Due to this being a telephonic visit, the after visit summary with patients personalized plan was offered to patient via MyChart   Notes: Nothing significant to report at this time.

## 2023-09-19 NOTE — Patient Instructions (Signed)
 James Dunn , Thank you for taking time to come for your Medicare Wellness Visit. I appreciate your ongoing commitment to your health goals. Please review the following plan we discussed and let me know if I can assist you in the future.   Referrals/Orders/Follow-Ups/Clinician Recommendations: none  This is a list of the screening recommended for you and due dates:  Health Maintenance  Topic Date Due   Pneumonia Vaccine (3 of 3 - PPSV23 or PCV20) 05/11/2017   COVID-19 Vaccine (8 - Moderna risk 2024-25 season) 08/30/2023   Hemoglobin A1C  10/17/2023   Flu Shot  01/19/2024   Eye exam for diabetics  03/07/2024   Yearly kidney health urinalysis for diabetes  04/17/2024   Complete foot exam   04/17/2024   Yearly kidney function blood test for diabetes  09/10/2024   Medicare Annual Wellness Visit  09/18/2024   DTaP/Tdap/Td vaccine (4 - Td or Tdap) 07/07/2029   Colon Cancer Screening  10/23/2032   Hepatitis C Screening  Completed   Zoster (Shingles) Vaccine  Completed   HPV Vaccine  Aged Out    Advanced directives: (In Chart) A copy of your advanced directives are scanned into your chart should your provider ever need it.  Next Medicare Annual Wellness Visit scheduled for next year: Yes  insert Preventive Care attachment Insert FALL PREVENTION attachment if needed

## 2023-09-26 DIAGNOSIS — R233 Spontaneous ecchymoses: Secondary | ICD-10-CM | POA: Diagnosis not present

## 2023-09-26 DIAGNOSIS — L570 Actinic keratosis: Secondary | ICD-10-CM | POA: Diagnosis not present

## 2023-09-26 DIAGNOSIS — L821 Other seborrheic keratosis: Secondary | ICD-10-CM | POA: Diagnosis not present

## 2023-09-26 DIAGNOSIS — D225 Melanocytic nevi of trunk: Secondary | ICD-10-CM | POA: Diagnosis not present

## 2023-09-26 DIAGNOSIS — L814 Other melanin hyperpigmentation: Secondary | ICD-10-CM | POA: Diagnosis not present

## 2023-10-23 DIAGNOSIS — K219 Gastro-esophageal reflux disease without esophagitis: Secondary | ICD-10-CM | POA: Diagnosis not present

## 2023-10-23 DIAGNOSIS — K222 Esophageal obstruction: Secondary | ICD-10-CM | POA: Diagnosis not present

## 2023-10-23 DIAGNOSIS — R1310 Dysphagia, unspecified: Secondary | ICD-10-CM | POA: Diagnosis not present

## 2023-10-25 ENCOUNTER — Other Ambulatory Visit: Payer: Self-pay | Admitting: Family Medicine

## 2023-11-02 ENCOUNTER — Ambulatory Visit (INDEPENDENT_AMBULATORY_CARE_PROVIDER_SITE_OTHER): Payer: PPO | Admitting: Family Medicine

## 2023-11-02 ENCOUNTER — Encounter: Payer: Self-pay | Admitting: Family Medicine

## 2023-11-02 VITALS — BP 130/80 | HR 74 | Ht 68.0 in | Wt 160.0 lb

## 2023-11-02 DIAGNOSIS — E1159 Type 2 diabetes mellitus with other circulatory complications: Secondary | ICD-10-CM

## 2023-11-02 DIAGNOSIS — K219 Gastro-esophageal reflux disease without esophagitis: Secondary | ICD-10-CM | POA: Diagnosis not present

## 2023-11-02 DIAGNOSIS — Z9889 Other specified postprocedural states: Secondary | ICD-10-CM

## 2023-11-02 DIAGNOSIS — E1169 Type 2 diabetes mellitus with other specified complication: Secondary | ICD-10-CM

## 2023-11-02 DIAGNOSIS — I700 Atherosclerosis of aorta: Secondary | ICD-10-CM

## 2023-11-02 DIAGNOSIS — Z860100 Personal history of colon polyps, unspecified: Secondary | ICD-10-CM

## 2023-11-02 DIAGNOSIS — J4520 Mild intermittent asthma, uncomplicated: Secondary | ICD-10-CM | POA: Diagnosis not present

## 2023-11-02 DIAGNOSIS — N5201 Erectile dysfunction due to arterial insufficiency: Secondary | ICD-10-CM

## 2023-11-02 DIAGNOSIS — Z9841 Cataract extraction status, right eye: Secondary | ICD-10-CM

## 2023-11-02 DIAGNOSIS — Z23 Encounter for immunization: Secondary | ICD-10-CM | POA: Diagnosis not present

## 2023-11-02 DIAGNOSIS — F1010 Alcohol abuse, uncomplicated: Secondary | ICD-10-CM | POA: Diagnosis not present

## 2023-11-02 DIAGNOSIS — Z0000 Encounter for general adult medical examination without abnormal findings: Secondary | ICD-10-CM

## 2023-11-02 DIAGNOSIS — Z8501 Personal history of malignant neoplasm of esophagus: Secondary | ICD-10-CM

## 2023-11-02 DIAGNOSIS — E119 Type 2 diabetes mellitus without complications: Secondary | ICD-10-CM | POA: Diagnosis not present

## 2023-11-02 DIAGNOSIS — J301 Allergic rhinitis due to pollen: Secondary | ICD-10-CM

## 2023-11-02 DIAGNOSIS — E785 Hyperlipidemia, unspecified: Secondary | ICD-10-CM | POA: Diagnosis not present

## 2023-11-02 DIAGNOSIS — I152 Hypertension secondary to endocrine disorders: Secondary | ICD-10-CM | POA: Diagnosis not present

## 2023-11-02 DIAGNOSIS — D126 Benign neoplasm of colon, unspecified: Secondary | ICD-10-CM

## 2023-11-02 LAB — POCT GLYCOSYLATED HEMOGLOBIN (HGB A1C): Hemoglobin A1C: 6.4 % — AB (ref 4.0–5.6)

## 2023-11-02 MED ORDER — ESOMEPRAZOLE MAGNESIUM 40 MG PO CPDR: 40.0000 mg | DELAYED_RELEASE_CAPSULE | Freq: Every day | ORAL | 3 refills | Status: AC

## 2023-11-02 MED ORDER — ALBUTEROL SULFATE HFA 108 (90 BASE) MCG/ACT IN AERS: 1.0000 | INHALATION_SPRAY | Freq: Four times a day (QID) | RESPIRATORY_TRACT | 0 refills | Status: DC | PRN

## 2023-11-02 MED ORDER — LISINOPRIL 5 MG PO TABS: 5.0000 mg | ORAL_TABLET | Freq: Every day | ORAL | 1 refills | Status: DC

## 2023-11-02 MED ORDER — METFORMIN HCL 500 MG PO TABS: 500.0000 mg | ORAL_TABLET | Freq: Two times a day (BID) | ORAL | 0 refills | Status: DC

## 2023-11-02 NOTE — Progress Notes (Signed)
 Complete physical exam  Patient: James Dunn   DOB: 10-Dec-1950   73 y.o. Male  MRN: [id number]  Subjective:     Chief Complaint  Patient presents with   Annual Exam    Cpe. Fasting. Been coughing a lot. Having dr. Nickey Barn do an endoscopy.     James Dunn is a 73 y.o. male who presents today for a complete physical exam.  He reports consuming a general diet. Home exercise routine includes lift weight, bike, golf. He generally feels well. He reports sleeping well.  He notes that he has been clearing his throat a lot and also has set up an appoint to see Dr. Nickey Barn for follow-up on this.  He does have a previous history of esophageal cancer.  Does have a history of colonic polyps and did have a recent colonoscopy.  He also has underlying allergies and they seem to not be bothering him at the present moment.  He does have an underlying history of asthma but is not using Symbicort or albuterol .  He does occasionally use 1 or the other but not sure which one he is using he does continue on Nexium .  He does not complain of any acid reflux type symptoms.  Taking metformin  regularly.  He keeps himself busy with physical activity mainly playing golf.  He notes that his drinking has increased.  He did mention the possibility of getting on the naltrexone.  Apparently he has checked into this. Continues on lisinopril  and Lipitor. Most recent fall risk assessment:    11/02/2023    9:33 AM  Fall Risk   Falls in the past year? 1  Number falls in past yr: 0  Injury with Fall? 1  Risk for fall due to : History of fall(s)  Follow up Falls evaluation completed     Most recent depression screenings:    11/02/2023    9:33 AM 09/19/2023    8:52 AM  PHQ 2/9 Scores  PHQ - 2 Score 0 0  PHQ- 9 Score  0    Vision:Within last year and Dental: No current dental problems and Last dental visit: root canal and crown coming soon.    Immunization History  Administered Date(s) Administered   DTaP 11/21/1991    Fluad Quad(high Dose 65+) 02/26/2019, 03/16/2020, 02/24/2022   Fluad Trivalent(High Dose 65+) 03/08/2023   Influenza Split 06/28/2011   Influenza, High Dose Seasonal PF 05/21/2015, 03/30/2016, 04/04/2017, 05/22/2018   Influenza, Seasonal, Injecte, Preservative Fre 07/10/2012   Influenza,inj,Quad PF,6+ Mos 04/02/2013   Influenza-Unspecified 03/24/2021   Moderna Covid-19 Fall Seasonal Vaccine 44yrs & older 03/31/2022, 03/02/2023   Moderna Covid-19 Vaccine Bivalent Booster 80yrs & up 03/24/2021   Moderna Sars-Covid-2 Vaccination 08/04/2019, 09/02/2019, 02/24/2020, 10/29/2020   Pneumococcal Conjugate-13 05/11/2016   Pneumococcal Polysaccharide-23 01/17/2001   RSV,unspecified 03/31/2022   Tdap 12/03/2009, 07/08/2019   Unspecified SARS-COV-2 Vaccination 03/31/2022   Zoster Recombinant(Shingrix ) 07/18/2017, 09/21/2017   Zoster, Live 11/21/2011, 07/18/2017    Health Maintenance  Topic Date Due   Pneumonia Vaccine 57+ Years old (3 of 3 - PCV20 or PCV21) 05/11/2021   COVID-19 Vaccine (8 - Moderna risk 2024-25 season) 08/30/2023   HEMOGLOBIN A1C  10/17/2023   INFLUENZA VACCINE  01/19/2024   OPHTHALMOLOGY EXAM  03/07/2024   Diabetic kidney evaluation - Urine ACR  04/17/2024   FOOT EXAM  04/17/2024   Diabetic kidney evaluation - eGFR measurement  09/10/2024   Medicare Annual Wellness (AWV)  09/18/2024   DTaP/Tdap/Td (4 - Td  or Tdap) 07/07/2029   Colonoscopy  10/23/2032   Hepatitis C Screening  Completed   Zoster Vaccines- Shingrix   Completed   HPV VACCINES  Aged Out   Meningococcal B Vaccine  Aged Out    Patient Care Team: Watson Hacking, MD as PCP - General (Family Medicine) Alvis Jourdain, MD as Consulting Physician (Gastroenterology) Sonja Rosebud, MD as Consulting Physician (Hematology) Eilleen Grates, MD as Consulting Physician (Cardiology)   Outpatient Medications Prior to Visit  Medication Sig Note   atorvastatin  (LIPITOR) 40 MG tablet TAKE 1 TABLET BY MOUTH EVERY DAY     budesonide-formoterol (SYMBICORT) 160-4.5 MCG/ACT inhaler  11/02/2023: Needs refill   Multiple Vitamins-Minerals (MULTIVITAMIN WITH MINERALS) tablet Take 1 tablet by mouth daily.    [DISCONTINUED] albuterol  (VENTOLIN  HFA) 108 (90 Base) MCG/ACT inhaler Inhale 1 puff into the lungs every 6 (six) hours as needed for wheezing or shortness of breath.    [DISCONTINUED] esomeprazole  (NEXIUM ) 40 MG capsule Take 1 capsule (40 mg total) by mouth daily.    [DISCONTINUED] lisinopril  (ZESTRIL ) 5 MG tablet TAKE 1 TABLET (5 MG TOTAL) BY MOUTH DAILY.    [DISCONTINUED] metFORMIN  (GLUCOPHAGE ) 500 MG tablet TAKE 1 TABLET BY MOUTH 2 TIMES DAILY WITH A MEAL.    beta carotene w/minerals (OCUVITE) tablet Take 1 tablet by mouth daily. (Patient not taking: Reported on 11/02/2023)    Misc Natural Products (ESSIAC TONIC) LIQD Take 2 oz by mouth daily. Herbal Tea (Patient not taking: Reported on 11/02/2023)    No facility-administered medications prior to visit.    Review of Systems  All other systems reviewed and are negative.   Family and social history as well as health maintenance and immunizations was reviewed.     Objective:     Physical Exam  Alert and in no distress. Tympanic membranes and canals are normal. Pharyngeal area is normal. Neck is supple without adenopathy or thyromegaly. Cardiac exam shows a regular sinus rhythm without murmurs or gallops. Lungs are clear to auscultation. Hemoglobin A1c is 6.4      Assessment & Plan:     Routine general medical examination at a health care facility  Seasonal allergic rhinitis due to pollen  Mild intermittent asthma without complication - Plan: albuterol  (VENTOLIN  HFA) 108 (90 Base) MCG/ACT inhaler  Hypertension associated with diabetes (HCC)  Hyperlipidemia associated with type 2 diabetes mellitus (HCC) - Plan: Lipid panel  History of esophageal dilatation  History of esophageal cancer  History of colonic polyps - Tubular adenoma  H/O  esophagectomy  Controlled type 2 diabetes mellitus without complication, without long-term current use of insulin  (HCC) - Plan: POCT glycosylated hemoglobin (Hb A1C), lisinopril  (ZESTRIL ) 5 MG tablet, metFORMIN  (GLUCOPHAGE ) 500 MG tablet  Atherosclerosis of aorta (HCC)  Erectile dysfunction due to arterial insufficiency  History of right cataract extraction  Need for vaccination against Streptococcus pneumoniae - Plan: Pneumococcal conjugate vaccine 20-valent (Prevnar 20)  Gastroesophageal reflux disease, unspecified whether esophagitis present - Plan: esomeprazole  (NEXIUM ) 40 MG capsule  Call [facility].  [phone].  I also discussed going to AA and getting individual counseling.  Hopefully he will call [facility] to get help with his alcohol consumption.  He mentioned the use of naltrexone and I explained that that would be something that he would need to discuss with psychiatry as I do not use that medication. Stop the Symbicort but keep me informed as to how you are doing with your coughing and breathing You can use the  albuterol  for coughing and/or wheezing if you need it more than twice per week during the day or twice per month at night I need to know Continue other medications. Return in about 6 months (around 05/04/2024).      Ron Cobbs, MD

## 2023-11-02 NOTE — Patient Instructions (Addendum)
 Call [facility].  [phone] Stop the Symbicort but keep me informed as to how you are doing with your coughing and breathing You can use the albuterol  for coughing and/or wheezing if you need it more than twice per week during the day or twice per month at night I need to know

## 2023-11-03 LAB — LIPID PANEL
Chol/HDL Ratio: 2.7 ratio (ref 0.0–5.0)
Cholesterol, Total: 129 mg/dL (ref 100–199)
HDL: 47 mg/dL (ref >39)
LDL Chol Calc (NIH): 67 mg/dL (ref 0–99)
Triglycerides: 75 mg/dL (ref 0–149)
VLDL Cholesterol Cal: 15 mg/dL (ref 5–40)

## 2023-11-04 ENCOUNTER — Ambulatory Visit: Payer: Self-pay | Admitting: Family Medicine

## 2023-11-09 DIAGNOSIS — K222 Esophageal obstruction: Secondary | ICD-10-CM | POA: Diagnosis not present

## 2023-11-09 DIAGNOSIS — R1310 Dysphagia, unspecified: Secondary | ICD-10-CM | POA: Diagnosis not present

## 2023-11-09 DIAGNOSIS — Z980 Intestinal bypass and anastomosis status: Secondary | ICD-10-CM | POA: Diagnosis not present

## 2023-12-06 ENCOUNTER — Other Ambulatory Visit: Payer: Self-pay | Admitting: Family Medicine

## 2023-12-06 DIAGNOSIS — J4520 Mild intermittent asthma, uncomplicated: Secondary | ICD-10-CM

## 2023-12-06 NOTE — Telephone Encounter (Signed)
 Last apt 11/02/23.

## 2024-01-08 DIAGNOSIS — H02831 Dermatochalasis of right upper eyelid: Secondary | ICD-10-CM | POA: Diagnosis not present

## 2024-01-08 DIAGNOSIS — H04123 Dry eye syndrome of bilateral lacrimal glands: Secondary | ICD-10-CM | POA: Diagnosis not present

## 2024-01-08 DIAGNOSIS — E119 Type 2 diabetes mellitus without complications: Secondary | ICD-10-CM | POA: Diagnosis not present

## 2024-01-08 DIAGNOSIS — H02834 Dermatochalasis of left upper eyelid: Secondary | ICD-10-CM | POA: Diagnosis not present

## 2024-01-08 DIAGNOSIS — H5703 Miosis: Secondary | ICD-10-CM | POA: Diagnosis not present

## 2024-01-08 DIAGNOSIS — Z961 Presence of intraocular lens: Secondary | ICD-10-CM | POA: Diagnosis not present

## 2024-01-08 LAB — OPHTHALMOLOGY REPORT-SCANNED

## 2024-03-13 ENCOUNTER — Other Ambulatory Visit: Payer: Self-pay | Admitting: Family Medicine

## 2024-03-13 DIAGNOSIS — E119 Type 2 diabetes mellitus without complications: Secondary | ICD-10-CM

## 2024-03-26 DIAGNOSIS — L570 Actinic keratosis: Secondary | ICD-10-CM | POA: Diagnosis not present

## 2024-03-26 DIAGNOSIS — D1801 Hemangioma of skin and subcutaneous tissue: Secondary | ICD-10-CM | POA: Diagnosis not present

## 2024-03-26 DIAGNOSIS — L821 Other seborrheic keratosis: Secondary | ICD-10-CM | POA: Diagnosis not present

## 2024-03-26 DIAGNOSIS — L814 Other melanin hyperpigmentation: Secondary | ICD-10-CM | POA: Diagnosis not present

## 2024-03-26 DIAGNOSIS — L603 Nail dystrophy: Secondary | ICD-10-CM | POA: Diagnosis not present

## 2024-05-07 ENCOUNTER — Ambulatory Visit
Admission: RE | Admit: 2024-05-07 | Discharge: 2024-05-07 | Disposition: A | Source: Ambulatory Visit | Attending: Family Medicine | Admitting: Family Medicine

## 2024-05-07 ENCOUNTER — Encounter: Payer: Self-pay | Admitting: Family Medicine

## 2024-05-07 ENCOUNTER — Ambulatory Visit: Payer: Self-pay | Admitting: Family Medicine

## 2024-05-07 ENCOUNTER — Telehealth: Payer: Self-pay

## 2024-05-07 VITALS — BP 140/84 | HR 80 | Ht 67.0 in | Wt 160.8 lb

## 2024-05-07 DIAGNOSIS — E119 Type 2 diabetes mellitus without complications: Secondary | ICD-10-CM

## 2024-05-07 DIAGNOSIS — Z7984 Long term (current) use of oral hypoglycemic drugs: Secondary | ICD-10-CM | POA: Diagnosis not present

## 2024-05-07 DIAGNOSIS — R052 Subacute cough: Secondary | ICD-10-CM

## 2024-05-07 DIAGNOSIS — R059 Cough, unspecified: Secondary | ICD-10-CM | POA: Diagnosis not present

## 2024-05-07 DIAGNOSIS — J301 Allergic rhinitis due to pollen: Secondary | ICD-10-CM | POA: Diagnosis not present

## 2024-05-07 DIAGNOSIS — J4520 Mild intermittent asthma, uncomplicated: Secondary | ICD-10-CM | POA: Diagnosis not present

## 2024-05-07 LAB — POCT GLYCOSYLATED HEMOGLOBIN (HGB A1C): Hemoglobin A1C: 6.1 % — AB (ref 4.0–5.6)

## 2024-05-07 MED ORDER — ALBUTEROL SULFATE HFA 108 (90 BASE) MCG/ACT IN AERS: 2.0000 | INHALATION_SPRAY | Freq: Four times a day (QID) | RESPIRATORY_TRACT | 1 refills | Status: AC | PRN

## 2024-05-07 MED ORDER — BUDESONIDE-FORMOTEROL FUMARATE 160-4.5 MCG/ACT IN AERO: 2.0000 | INHALATION_SPRAY | Freq: Two times a day (BID) | RESPIRATORY_TRACT | 1 refills | Status: DC

## 2024-05-07 NOTE — Progress Notes (Signed)
   Subjective:    Patient ID: James Dunn, male    DOB: 1950-07-05, 73 y.o.   MRN: [id number]  Discussed the use of AI scribe software for clinical note transcription with the patient, who gave verbal consent to proceed.  History of Present Illness   James Dunn is a 73 year old male with asthma and diabetes who presents with a chronic cough.  He has experienced a persistent cough for the past six months to a year, initially attributing it to pollen exposure. The cough worsens outdoors, particularly on [place], and is described as a constant tickle in his throat, leading to frequent throat clearing and coughing fits. These episodes are sometimes severe enough to disturb his wife's sleep. The cough is not alleviated by his albuterol  inhaler, which he uses occasionally, and he has not been using his Symbicort inhaler regularly as he is out of it.  He has a history of asthma and allergies but does not currently take any medication for allergies. He experiences a runny nose, especially in the mornings, but denies frequent sneezing or itchy eyes. Over-the-counter medications for sinus drainage have not provided significant relief.  He manages diabetes with metformin , He takes lisinopril  and a cholesterol medication as part of his regimen.  He experiences gastroesophageal reflux disease (GERD) and uses a wedge pillow to prevent reflux symptoms at night.  His current medications include metformin  for diabetes, lisinopril , a cholesterol medication, and Nexium  for reflux. He uses albuterol  as needed for asthma but has not been using Symbicort regularly.  No wheezing or difficulty breathing but reports a persistent cough and throat clearing. He experiences a runny nose, particularly in the morning, but denies frequent sneezing or itchy eyes.           Review of Systems     Objective:    Physical Exam Alert and in no distress. Tympanic membranes and canals are normal. Pharyngeal area  is normal. Neck is supple without adenopathy or thyromegaly. Cardiac exam shows a regular sinus rhythm without murmurs or gallops. Lungs show harsh breath sounds especially on the right.          Assessment & Plan:  Assessment and Plan    Chronic cough with allergic rhinitis, asthma, and gastroesophageal reflux disease Chronic cough likely due to allergic rhinitis, asthma, and GERD. Asthma symptoms intermittent, not using Symbicort regularly. GERD managed with Nexium .  - Initiated Claritin or Allegra for allergic rhinitis. - Prescribed Symbicort twice daily for asthma management. - Refilled albuterol  inhaler for as-needed use. - Continue Nexium  for GERD management. - Scheduled follow-up in one month to assess treatment efficacy.  Type 2 diabetes mellitus without complications Type 2 diabetes well-controlled with metformin . Recent A1c stable at 6.1%. - Continue metformin  for diabetes management.

## 2024-05-07 NOTE — Telephone Encounter (Signed)
 Copied from CRM [id number]. Topic: Clinical - Prescription Issue >> May 07, 2024 11:08 AM Selinda RAMAN wrote: Reason for CRM: The patient called in stating his insurance does not cover the budesonide-formoterol (SYMBICORT) 160-4.5 MCG/ACT inhaler. Can something else that is comparable be called in that his insurance will cover? If so please call it into his pharmacy at  [id number] - [place], [place] - [address] AT [facility] [address]  Phone: [phone] Fax: [phone]

## 2024-05-08 ENCOUNTER — Ambulatory Visit: Payer: Self-pay | Admitting: Family Medicine

## 2024-05-08 MED ORDER — BUDESON-GLYCOPYRROL-FORMOTEROL 160-9-4.8 MCG/ACT IN AERO: 2.0000 | INHALATION_SPRAY | Freq: Two times a day (BID) | RESPIRATORY_TRACT | 5 refills | Status: AC

## 2024-05-08 NOTE — Addendum Note (Signed)
 Addended by: JOYCE RUSH C on: 05/08/2024 11:10 AM   Modules accepted: Orders

## 2024-06-04 ENCOUNTER — Ambulatory Visit (INDEPENDENT_AMBULATORY_CARE_PROVIDER_SITE_OTHER): Admitting: Family Medicine

## 2024-06-04 ENCOUNTER — Encounter: Payer: Self-pay | Admitting: Family Medicine

## 2024-06-04 VITALS — BP 132/68 | HR 76 | Ht 67.0 in | Wt 163.0 lb

## 2024-06-04 DIAGNOSIS — K2100 Gastro-esophageal reflux disease with esophagitis, without bleeding: Secondary | ICD-10-CM | POA: Diagnosis not present

## 2024-06-04 DIAGNOSIS — J4520 Mild intermittent asthma, uncomplicated: Secondary | ICD-10-CM

## 2024-06-04 DIAGNOSIS — E119 Type 2 diabetes mellitus without complications: Secondary | ICD-10-CM | POA: Diagnosis not present

## 2024-06-04 DIAGNOSIS — J301 Allergic rhinitis due to pollen: Secondary | ICD-10-CM | POA: Diagnosis not present

## 2024-06-04 NOTE — Progress Notes (Signed)
° °  Subjective:    Patient ID: James Dunn, male    DOB: 12/30/1950, 73 y.o.   MRN: [id number]  Discussed the use of AI scribe software for clinical note transcription with the patient, who gave verbal consent to proceed.  History of Present Illness   James Dunn is a 73 year old male with asthma, allergies, and acid reflux who presents for follow-up of his respiratory symptoms and medication management.  He has ongoing issues with coughing, primarily attributed to his sinuses. His history of allergies, asthma, and acid reflux are contributing factors to his respiratory symptoms. Symptoms have improved with the use of his current medications, including an inhaler and allergy medication.  He is currently using Symbicort  for asthma management, which has been effective in controlling his breathing issues. He also uses cetirizine (Zyrtec) for allergies, although not daily. He dislikes taking medications.  For acid reflux, he takes Nexium  and sleeps on a wedge to prevent symptoms. He experiences acid reflux if he rolls off the wedge during sleep, leading to a burning sensation in his throat.  He rarely uses his albuterol  inhaler anymore. He also takes metformin  for diabetes management, with his last A1c recorded at 6.1.           Review of Systems     Objective:    Physical Exam Alert and in no distress otherwise not examined           Assessment & Plan:  Asthma and allergic rhinitis Discussed tachyphylaxis with cetirizine and potential switch to Allegra if needed. - Continue Breztri inhaler. - Continue on cetirizine and if not working, switch to Allegra or Claritin..  Gastroesophageal reflux disease Well-managed with Nexium . Emphasized medication adherence. - Continue Nexium .  Type 2 diabetes mellitus Well-controlled with A1c of 6.1 on metformin . - Continue metformin .

## 2024-06-06 ENCOUNTER — Ambulatory Visit: Admitting: Family Medicine

## 2024-06-06 NOTE — Progress Notes (Signed)
 James Dunn                                          MRN: [id number]   06/06/2024   The [facility] Quality Team Specialist reviewed this patient medical record for the purposes of chart review for care gap closure. The following were reviewed: chart review for care gap closure-kidney health evaluation for diabetes:uACR.    [facility] Quality Team

## 2024-06-12 ENCOUNTER — Other Ambulatory Visit: Payer: Self-pay | Admitting: Family Medicine

## 2024-06-12 DIAGNOSIS — E119 Type 2 diabetes mellitus without complications: Secondary | ICD-10-CM

## 2024-09-09 ENCOUNTER — Inpatient Hospital Stay

## 2024-09-09 ENCOUNTER — Inpatient Hospital Stay: Admitting: Hematology

## 2024-09-24 ENCOUNTER — Ambulatory Visit: Payer: Self-pay

## 2024-12-05 ENCOUNTER — Ambulatory Visit: Admitting: Family Medicine
